# Patient Record
Sex: Female | Born: 1965 | Race: Black or African American | Hispanic: No | Smoking: Former smoker
Health system: Southern US, Community
[De-identification: ages and names within clinical notes are randomized; demographics above are authoritative.]

## PROBLEM LIST (undated history)

## (undated) DIAGNOSIS — I10 Essential (primary) hypertension: Secondary | ICD-10-CM

## (undated) DIAGNOSIS — E669 Obesity, unspecified: Secondary | ICD-10-CM

## (undated) DIAGNOSIS — Z923 Personal history of irradiation: Secondary | ICD-10-CM

## (undated) DIAGNOSIS — Z853 Personal history of malignant neoplasm of breast: Secondary | ICD-10-CM

## (undated) DIAGNOSIS — D649 Anemia, unspecified: Secondary | ICD-10-CM

## (undated) DIAGNOSIS — Z803 Family history of malignant neoplasm of breast: Secondary | ICD-10-CM

## (undated) DIAGNOSIS — C183 Malignant neoplasm of hepatic flexure: Secondary | ICD-10-CM

## (undated) DIAGNOSIS — G4730 Sleep apnea, unspecified: Secondary | ICD-10-CM

## (undated) DIAGNOSIS — N189 Chronic kidney disease, unspecified: Secondary | ICD-10-CM

## (undated) HISTORY — DX: Family history of malignant neoplasm of breast: Z80.3

## (undated) HISTORY — PX: CERVICAL SPINE SURGERY: SHX589

## (undated) HISTORY — DX: Essential (primary) hypertension: I10

## (undated) HISTORY — PX: TUBAL LIGATION: SHX77

## (undated) HISTORY — DX: Obesity, unspecified: E66.9

## (undated) HISTORY — PX: APPENDECTOMY: SHX54

## (undated) HISTORY — DX: Anemia, unspecified: D64.9

## (undated) HISTORY — PX: KNEE SURGERY: SHX244

---

## 2001-01-20 ENCOUNTER — Encounter: Admission: RE | Admit: 2001-01-20 | Discharge: 2001-04-20 | Payer: Self-pay | Admitting: Internal Medicine

## 2001-06-02 ENCOUNTER — Other Ambulatory Visit: Admission: RE | Admit: 2001-06-02 | Discharge: 2001-06-02 | Payer: Self-pay | Admitting: Internal Medicine

## 2002-01-17 ENCOUNTER — Emergency Department (HOSPITAL_COMMUNITY): Admission: EM | Admit: 2002-01-17 | Discharge: 2002-01-17 | Payer: Self-pay | Admitting: Emergency Medicine

## 2002-01-17 ENCOUNTER — Encounter: Payer: Self-pay | Admitting: Emergency Medicine

## 2003-12-30 ENCOUNTER — Other Ambulatory Visit: Admission: RE | Admit: 2003-12-30 | Discharge: 2003-12-30 | Payer: Self-pay | Admitting: Neurosurgery

## 2004-11-21 ENCOUNTER — Ambulatory Visit: Payer: Self-pay | Admitting: Internal Medicine

## 2005-01-01 ENCOUNTER — Ambulatory Visit: Payer: Self-pay | Admitting: Internal Medicine

## 2005-02-27 ENCOUNTER — Ambulatory Visit: Payer: Self-pay | Admitting: Internal Medicine

## 2005-05-23 ENCOUNTER — Ambulatory Visit: Payer: Self-pay | Admitting: Internal Medicine

## 2005-08-09 ENCOUNTER — Ambulatory Visit: Payer: Self-pay | Admitting: Internal Medicine

## 2005-08-13 ENCOUNTER — Ambulatory Visit: Payer: Self-pay | Admitting: Internal Medicine

## 2005-08-24 ENCOUNTER — Ambulatory Visit: Payer: Self-pay | Admitting: Internal Medicine

## 2005-09-28 ENCOUNTER — Ambulatory Visit: Payer: Self-pay | Admitting: Internal Medicine

## 2006-01-11 ENCOUNTER — Ambulatory Visit: Payer: Self-pay | Admitting: Internal Medicine

## 2006-03-08 ENCOUNTER — Ambulatory Visit: Payer: Self-pay | Admitting: Internal Medicine

## 2006-05-03 ENCOUNTER — Ambulatory Visit: Payer: Self-pay | Admitting: Internal Medicine

## 2006-09-05 ENCOUNTER — Ambulatory Visit: Payer: Self-pay | Admitting: Internal Medicine

## 2006-09-05 LAB — CONVERTED CEMR LAB
ALT: 27 units/L (ref 0–40)
AST: 22 units/L (ref 0–37)
Albumin: 3.3 g/dL — ABNORMAL LOW (ref 3.5–5.2)
Alkaline Phosphatase: 107 units/L (ref 39–117)
BUN: 9 mg/dL (ref 6–23)
Basophils Absolute: 0.2 10*3/uL — ABNORMAL HIGH (ref 0.0–0.1)
Basophils Relative: 5.5 % — ABNORMAL HIGH (ref 0.0–1.0)
CO2: 28 meq/L (ref 19–32)
Calcium: 9.5 mg/dL (ref 8.4–10.5)
Chloride: 103 meq/L (ref 96–112)
Chol/HDL Ratio, serum: 3.8
Cholesterol: 167 mg/dL (ref 0–200)
Creatinine, Ser: 0.8 mg/dL (ref 0.4–1.2)
Eosinophil percent: 1.4 % (ref 0.0–5.0)
GFR calc non Af Amer: 84 mL/min
Glomerular Filtration Rate, Af Am: 102 mL/min/{1.73_m2}
Glucose, Bld: 333 mg/dL — ABNORMAL HIGH (ref 70–99)
HCT: 39.2 % (ref 36.0–46.0)
HDL: 43.4 mg/dL (ref >39.0)
Hemoglobin: 12.8 g/dL (ref 12.0–15.0)
Hgb A1c MFr Bld: 12.9 % — ABNORMAL HIGH (ref 4.6–6.0)
LDL Cholesterol: 106 mg/dL — ABNORMAL HIGH (ref 0–99)
Lymphocytes Relative: 27.1 % (ref 12.0–46.0)
MCHC: 32.6 g/dL (ref 30.0–36.0)
MCV: 92 fL (ref 78.0–100.0)
Monocytes Absolute: 0.3 10*3/uL (ref 0.2–0.7)
Monocytes Relative: 6.4 % (ref 3.0–11.0)
Neutro Abs: 2.5 10*3/uL (ref 1.4–7.7)
Neutrophils Relative %: 59.6 % (ref 43.0–77.0)
Platelets: 266 10*3/uL (ref 150–400)
Potassium: 4.5 meq/L (ref 3.5–5.1)
RBC: 4.26 M/uL (ref 3.87–5.11)
RDW: 12.6 % (ref 11.5–14.6)
Sodium: 138 meq/L (ref 135–145)
TSH: 1.14 microintl units/mL (ref 0.35–5.50)
Total Bilirubin: 0.8 mg/dL (ref 0.3–1.2)
Total Protein: 7.3 g/dL (ref 6.0–8.3)
Triglyceride fasting, serum: 88 mg/dL (ref 0–149)
VLDL: 18 mg/dL (ref 0–40)
WBC: 4.3 10*3/uL — ABNORMAL LOW (ref 4.5–10.5)

## 2006-09-09 ENCOUNTER — Encounter: Payer: Self-pay | Admitting: Internal Medicine

## 2006-09-09 ENCOUNTER — Ambulatory Visit: Payer: Self-pay | Admitting: Internal Medicine

## 2006-09-09 ENCOUNTER — Other Ambulatory Visit: Admission: RE | Admit: 2006-09-09 | Discharge: 2006-09-09 | Payer: Self-pay | Admitting: Internal Medicine

## 2006-10-18 ENCOUNTER — Ambulatory Visit: Payer: Self-pay | Admitting: Internal Medicine

## 2006-12-20 ENCOUNTER — Ambulatory Visit: Payer: Self-pay | Admitting: Internal Medicine

## 2006-12-27 ENCOUNTER — Ambulatory Visit: Payer: Self-pay | Admitting: Internal Medicine

## 2007-03-18 ENCOUNTER — Ambulatory Visit: Payer: Self-pay | Admitting: Internal Medicine

## 2007-04-29 ENCOUNTER — Ambulatory Visit: Payer: Self-pay | Admitting: Internal Medicine

## 2007-04-30 LAB — CONVERTED CEMR LAB
ALT: 27 units/L (ref 0–40)
AST: 24 units/L (ref 0–37)
Albumin: 3.7 g/dL (ref 3.5–5.2)
Alkaline Phosphatase: 100 units/L (ref 39–117)
Bilirubin, Direct: 0.1 mg/dL (ref 0.0–0.3)
Hgb A1c MFr Bld: 10 % — ABNORMAL HIGH (ref 4.6–6.0)
Total Bilirubin: 0.8 mg/dL (ref 0.3–1.2)
Total Protein: 7.7 g/dL (ref 6.0–8.3)

## 2007-06-30 DIAGNOSIS — R51 Headache: Secondary | ICD-10-CM | POA: Insufficient documentation

## 2007-06-30 DIAGNOSIS — R519 Headache, unspecified: Secondary | ICD-10-CM | POA: Insufficient documentation

## 2007-06-30 DIAGNOSIS — E119 Type 2 diabetes mellitus without complications: Secondary | ICD-10-CM | POA: Insufficient documentation

## 2007-06-30 DIAGNOSIS — D509 Iron deficiency anemia, unspecified: Secondary | ICD-10-CM

## 2007-06-30 DIAGNOSIS — E162 Hypoglycemia, unspecified: Secondary | ICD-10-CM | POA: Insufficient documentation

## 2007-07-01 ENCOUNTER — Telehealth: Payer: Self-pay | Admitting: Internal Medicine

## 2007-08-01 ENCOUNTER — Ambulatory Visit: Payer: Self-pay | Admitting: Internal Medicine

## 2007-08-01 DIAGNOSIS — I10 Essential (primary) hypertension: Secondary | ICD-10-CM | POA: Insufficient documentation

## 2007-08-01 LAB — CONVERTED CEMR LAB
HCT: 34.3 % — ABNORMAL LOW (ref 36.0–46.0)
Hemoglobin: 11.3 g/dL — ABNORMAL LOW (ref 12.0–15.0)
Hgb A1c MFr Bld: 10.8 % — ABNORMAL HIGH (ref 4.6–6.1)
MCHC: 32.9 g/dL (ref 30.0–36.0)
MCV: 90.3 fL (ref 78.0–100.0)
Platelets: 256 10*3/uL (ref 150–400)
RBC: 3.8 M/uL — ABNORMAL LOW (ref 3.87–5.11)
RDW: 13.4 % (ref 11.5–14.0)
WBC: 7.1 10*3/uL (ref 4.0–10.5)

## 2007-10-31 ENCOUNTER — Ambulatory Visit: Payer: Self-pay | Admitting: Internal Medicine

## 2007-12-05 ENCOUNTER — Ambulatory Visit: Payer: Self-pay | Admitting: Internal Medicine

## 2007-12-05 LAB — CONVERTED CEMR LAB
BUN: 9 mg/dL (ref 6–23)
Basophils Absolute: 0.1 10*3/uL (ref 0.0–0.1)
Basophils Relative: 0.8 % (ref 0.0–1.0)
CO2: 27 meq/L (ref 19–32)
Calcium: 9.7 mg/dL (ref 8.4–10.5)
Chloride: 105 meq/L (ref 96–112)
Creatinine, Ser: 0.8 mg/dL (ref 0.4–1.2)
Eosinophils Absolute: 0.1 10*3/uL (ref 0.0–0.6)
Eosinophils Relative: 1.6 % (ref 0.0–5.0)
GFR calc Af Amer: 102 mL/min
GFR calc non Af Amer: 84 mL/min
Glucose, Bld: 154 mg/dL — ABNORMAL HIGH (ref 70–99)
HCT: 32.7 % — ABNORMAL LOW (ref 36.0–46.0)
Hemoglobin: 11.2 g/dL — ABNORMAL LOW (ref 12.0–15.0)
Hgb A1c MFr Bld: 7.9 % — ABNORMAL HIGH (ref 4.6–6.0)
Lymphocytes Relative: 27 % (ref 12.0–46.0)
MCHC: 34.3 g/dL (ref 30.0–36.0)
MCV: 91.2 fL (ref 78.0–100.0)
Monocytes Absolute: 0.5 10*3/uL (ref 0.2–0.7)
Monocytes Relative: 6.2 % (ref 3.0–11.0)
Neutro Abs: 4.7 10*3/uL (ref 1.4–7.7)
Neutrophils Relative %: 64.4 % (ref 43.0–77.0)
Platelets: 275 10*3/uL (ref 150–400)
Potassium: 4.6 meq/L (ref 3.5–5.1)
RBC: 3.59 M/uL — ABNORMAL LOW (ref 3.87–5.11)
RDW: 13.7 % (ref 11.5–14.6)
Sodium: 140 meq/L (ref 135–145)
WBC: 7.4 10*3/uL (ref 4.5–10.5)

## 2008-02-02 ENCOUNTER — Ambulatory Visit: Payer: Self-pay | Admitting: Internal Medicine

## 2008-03-05 ENCOUNTER — Telehealth: Payer: Self-pay | Admitting: Internal Medicine

## 2008-04-19 ENCOUNTER — Ambulatory Visit: Payer: Self-pay | Admitting: Internal Medicine

## 2008-04-19 LAB — CONVERTED CEMR LAB: Hgb A1c MFr Bld: 8.8 % — ABNORMAL HIGH (ref 4.6–6.0)

## 2008-04-26 ENCOUNTER — Ambulatory Visit: Payer: Self-pay | Admitting: Internal Medicine

## 2008-06-11 ENCOUNTER — Telehealth: Payer: Self-pay | Admitting: Internal Medicine

## 2008-08-08 ENCOUNTER — Emergency Department (HOSPITAL_COMMUNITY): Admission: EM | Admit: 2008-08-08 | Discharge: 2008-08-08 | Payer: Self-pay | Admitting: Family Medicine

## 2008-09-20 ENCOUNTER — Ambulatory Visit: Payer: Self-pay | Admitting: Internal Medicine

## 2009-01-18 ENCOUNTER — Ambulatory Visit: Payer: Self-pay | Admitting: Internal Medicine

## 2009-05-03 ENCOUNTER — Telehealth (INDEPENDENT_AMBULATORY_CARE_PROVIDER_SITE_OTHER): Payer: Self-pay | Admitting: *Deleted

## 2009-12-05 ENCOUNTER — Ambulatory Visit: Payer: Self-pay | Admitting: Internal Medicine

## 2009-12-08 LAB — CONVERTED CEMR LAB
BUN: 12 mg/dL (ref 6–23)
Basophils Absolute: 0.1 10*3/uL (ref 0.0–0.1)
Basophils Relative: 0.8 % (ref 0.0–3.0)
CO2: 24 meq/L (ref 19–32)
Calcium: 9.3 mg/dL (ref 8.4–10.5)
Chloride: 101 meq/L (ref 96–112)
Creatinine, Ser: 0.8 mg/dL (ref 0.4–1.2)
Eosinophils Absolute: 0 10*3/uL (ref 0.0–0.7)
Eosinophils Relative: 0.7 % (ref 0.0–5.0)
GFR calc non Af Amer: 100.3 mL/min (ref >60)
Glucose, Bld: 296 mg/dL — ABNORMAL HIGH (ref 70–99)
HCT: 37.4 % (ref 36.0–46.0)
Hemoglobin: 12.1 g/dL (ref 12.0–15.0)
Hgb A1c MFr Bld: 13.1 % — ABNORMAL HIGH (ref 4.6–6.5)
Lymphocytes Relative: 30.9 % (ref 12.0–46.0)
Lymphs Abs: 2.1 10*3/uL (ref 0.7–4.0)
MCHC: 32.3 g/dL (ref 30.0–36.0)
MCV: 94.8 fL (ref 78.0–100.0)
Monocytes Absolute: 0.3 10*3/uL (ref 0.1–1.0)
Monocytes Relative: 4.3 % (ref 3.0–12.0)
Neutro Abs: 4.3 10*3/uL (ref 1.4–7.7)
Neutrophils Relative %: 63.3 % (ref 43.0–77.0)
Platelets: 211 10*3/uL (ref 150.0–400.0)
Potassium: 3.9 meq/L (ref 3.5–5.1)
RBC: 3.94 M/uL (ref 3.87–5.11)
RDW: 12.9 % (ref 11.5–14.6)
Sodium: 136 meq/L (ref 135–145)
WBC: 6.8 10*3/uL (ref 4.5–10.5)

## 2010-02-06 ENCOUNTER — Ambulatory Visit: Payer: Self-pay | Admitting: Internal Medicine

## 2010-03-15 ENCOUNTER — Telehealth: Payer: Self-pay | Admitting: Internal Medicine

## 2010-04-03 ENCOUNTER — Ambulatory Visit: Payer: Self-pay | Admitting: Internal Medicine

## 2010-04-03 LAB — CONVERTED CEMR LAB: Hgb A1c MFr Bld: 11.9 % — ABNORMAL HIGH (ref 4.6–6.5)

## 2010-04-10 ENCOUNTER — Ambulatory Visit: Payer: Self-pay | Admitting: Internal Medicine

## 2010-11-07 ENCOUNTER — Encounter: Payer: Self-pay | Admitting: Internal Medicine

## 2010-12-18 ENCOUNTER — Other Ambulatory Visit: Payer: Self-pay | Admitting: Internal Medicine

## 2010-12-18 ENCOUNTER — Ambulatory Visit
Admission: RE | Admit: 2010-12-18 | Discharge: 2010-12-18 | Payer: Self-pay | Source: Home / Self Care | Attending: Internal Medicine | Admitting: Internal Medicine

## 2010-12-18 LAB — HEMOGLOBIN A1C: Hgb A1c MFr Bld: 13.7 % — ABNORMAL HIGH (ref 4.6–6.5)

## 2010-12-18 LAB — CBC WITH DIFFERENTIAL/PLATELET
Basophils Absolute: 0 10*3/uL (ref 0.0–0.1)
HCT: 36.6 % (ref 36.0–46.0)
Lymphs Abs: 1.8 10*3/uL (ref 0.7–4.0)
Monocytes Relative: 6.9 % (ref 3.0–12.0)
Neutrophils Relative %: 54.8 % (ref 43.0–77.0)
Platelets: 220 10*3/uL (ref 150.0–400.0)
RDW: 13.6 % (ref 11.5–14.6)

## 2010-12-18 LAB — BASIC METABOLIC PANEL
BUN: 12 mg/dL (ref 6–23)
Calcium: 8.7 mg/dL (ref 8.4–10.5)
Chloride: 103 mEq/L (ref 96–112)
Creatinine, Ser: 0.7 mg/dL (ref 0.4–1.2)
GFR: 126.85 mL/min (ref >60.00)

## 2010-12-18 LAB — IBC PANEL
Iron: 102 ug/dL (ref 42–145)
Transferrin: 237 mg/dL (ref 212.0–360.0)

## 2010-12-19 NOTE — Assessment & Plan Note (Signed)
Summary: fup on dm//ccm   Vital Signs:  Patient profile:   45 year old female Height:      65 inches Weight:      221 pounds BMI:     36.91 Temp:     98.2 degrees F oral Pulse rate:   72 / minute Resp:     14 per minute BP sitting:   144 / 80  (left arm) Cuff size:   large  Vitals Entered By: Allyne Gee, LPN (January 17, 624THL 4:03 PM) CC: roa- htn/dm   CC:  roa- htn/dm.  History of Present Illness: The pt has not been taking any pills due to cost and change in insurance she asserts thjat she will do better in 2011? She states that all of her refills have expired. She did not call for samples of help, although she admits that we have helped in the past feels week and has a hx of iron def anemia is not aware of her blood sugar reading or blood pressure readings   Preventive Screening-Counseling & Management  Alcohol-Tobacco     Smoking Status: quit  Allergies: 1)  ! Augustina Mood  Past History:  Family History: Last updated: 08/01/2007 Family History Diabetes 1st degree relative Family History of Asthma Family History Hypertension  Social History: Last updated: 06/30/2007 Former Smoker Alcohol use-no Occupation: Divorced  Risk Factors: Exercise: no (08/01/2007)  Risk Factors: Smoking Status: quit (12/05/2009)  Past medical, surgical, family and social histories (including risk factors) reviewed, and no changes noted (except as noted below).  Past Medical History: Reviewed history from 08/01/2007 and no changes required. Anemia-iron deficiency Diabetes mellitus, type II Headache Hypertension  Past Surgical History: Reviewed history from 06/30/2007 and no changes required. Appendectomy Caesarean section Tubal ligation  Family History: Reviewed history from 08/01/2007 and no changes required. Family History Diabetes 1st degree relative Family History of Asthma Family History Hypertension  Social History: Reviewed history from 06/30/2007 and  no changes required. Former Smoker Alcohol use-no Occupation: Divorced  Review of Systems  The patient denies anorexia, fever, weight loss, weight gain, vision loss, decreased hearing, hoarseness, chest pain, syncope, dyspnea on exertion, peripheral edema, prolonged cough, headaches, hemoptysis, abdominal pain, melena, hematochezia, severe indigestion/heartburn, hematuria, incontinence, genital sores, muscle weakness, suspicious skin lesions, transient blindness, difficulty walking, depression, unusual weight change, abnormal bleeding, enlarged lymph nodes, angioedema, and breast masses.    Physical Exam  General:  Well-developed,well-nourished,in no acute distress; alert,appropriate and cooperative throughout examination Head:  Normocephalic and atraumatic without obvious abnormalities. No apparent alopecia or balding. Eyes:  pupils equal and pupils round.   Ears:  R ear normal and L ear normal.   Nose:  no external erythema.   Mouth:  pharynx pink and moist.   Neck:  No deformities, masses, or tenderness noted. Lungs:  normal respiratory effort, normal breath sounds, and no wheezes.   Heart:  normal rate, regular rhythm, and Grade  1 /6 systolic ejection murmur.   Abdomen:  soft, no guarding, no rigidity, and distended.   Msk:  no joint swelling, no joint warmth, and no redness over joints.   Pulses:  R and L carotid,radial,femoral,dorsalis pedis and posterior tibial pulses are full and equal bilaterally Extremities:  trace left pedal edema and trace right pedal edema.   Neurologic:  alert & oriented X3, DTRs symmetrical and normal, and decreased sensation to LT.     Impression & Recommendations:  Problem # 1:  HYPERTENSION (ICD-401.9) Assessment Deteriorated  The following  medications were removed from the medication list:    Lasix 80 Mg Tabs (Furosemide) .Marland Kitchen... 1 once daily Her updated medication list for this problem includes:    Benicar Hct 20-12.5 Mg Tabs (Olmesartan  medoxomil-hctz) ..... One by mouth daily  BP today: 144/80 Prior BP: 134/80 (01/18/2009)  Prior 10 Yr Risk Heart Disease: 7 % (01/18/2009)  Labs Reviewed: K+: 4.6 (12/05/2007) Creat: : 0.8 (12/05/2007)   Chol: 167 (09/05/2006)   HDL: 43.4 (09/05/2006)   LDL: 106 (09/05/2006)   TG: 88 (09/05/2006)  Orders: TLB-BMP (Basic Metabolic Panel-BMET) (99991111)  Problem # 2:  DIABETES MELLITUS, TYPE II (ICD-250.00) Assessment: Deteriorated  Her updated medication list for this problem includes:    Lantus 100 Unit/ml Soln (Insulin glargine) .Marland KitchenMarland KitchenMarland KitchenMarland Kitchen 30u subcutaneously  at night    Benicar Hct 20-12.5 Mg Tabs (Olmesartan medoxomil-hctz) ..... One by mouth daily    Janumet 50-1000 Mg Tabs (Sitagliptin-metformin hcl) .Marland Kitchen..Marland Kitchen Two times a day  Labs Reviewed: Creat: 0.8 (12/05/2007)    Reviewed HgBA1c results: 8.8 (04/19/2008)  7.9 (12/05/2007)  Orders: TLB-A1C / Hgb A1C (Glycohemoglobin) (83036-A1C)  Problem # 3:  HYPERTENSION (ICD-401.9)  The following medications were removed from the medication list:    Lasix 80 Mg Tabs (Furosemide) .Marland Kitchen... 1 once daily Her updated medication list for this problem includes:    Benicar Hct 20-12.5 Mg Tabs (Olmesartan medoxomil-hctz) ..... One by mouth daily  BP today: 144/80 Prior BP: 134/80 (01/18/2009)  Prior 10 Yr Risk Heart Disease: 7 % (01/18/2009)  Labs Reviewed: K+: 4.6 (12/05/2007) Creat: : 0.8 (12/05/2007)   Chol: 167 (09/05/2006)   HDL: 43.4 (09/05/2006)   LDL: 106 (09/05/2006)   TG: 88 (09/05/2006)  Orders: TLB-BMP (Basic Metabolic Panel-BMET) (99991111)  Problem # 4:  ANEMIA-IRON DEFICIENCY (ICD-280.9)  Orders: TLB-CBC Platelet - w/Differential (85025-CBCD) Venipuncture HR:[id number])  Hgb: 11.2 (12/05/2007)   Hct: 32.7 (12/05/2007)   Platelets: 275 (12/05/2007) RBC: 3.59 (12/05/2007)   RDW: 13.7 (12/05/2007)   WBC: 7.4 (12/05/2007) MCV: 91.2 (12/05/2007)   MCHC: 34.3 (12/05/2007) TSH: 1.14 (09/05/2006)  Complete Medication  List: 1)  Lantus 100 Unit/ml Soln (Insulin glargine) .... 30u subcutaneously  at night 2)  Klor-con 10 10 Meq Tbcr (Potassium chloride) .... One by mouth bid 3)  Benicar Hct 20-12.5 Mg Tabs (Olmesartan medoxomil-hctz) .... One by mouth daily 4)  Janumet 50-1000 Mg Tabs (Sitagliptin-metformin hcl) .... Two times a day 5)  Accu-chek Aviva Strp (Glucose blood) .... Test three times a day   q ac  Patient Instructions: 1)  Please schedule a follow-up appointment in 2 months. Prescriptions: JANUMET 50-1000 MG  TABS (SITAGLIPTIN-METFORMIN HCL) two times a day  #60 x 11   Entered and Authorized by:   Ricard Dillon MD   Signed by:   Ricard Dillon MD on 12/05/2009   Method used:   Electronically to        [facility] [phone]* (retail)       [place], [place]  [address]       Ph: [phone]       Fax: [phone]   [id number] BENICAR HCT 20-12.5 MG TABS (OLMESARTAN MEDOXOMIL-HCTZ) one by mouth daily  #30 x 11   Entered and Authorized by:   Ricard Dillon MD   Signed by:   Ricard Dillon MD on 12/05/2009   Method used:   Electronically to        [facility] (retail)       [place], [place]  [address]       Ph: [phone]       Fax: [phone]   RxID:   [id number] LANTUS 100 UNIT/ML  SOLN (INSULIN GLARGINE) 30u subcutaneously  at night  #3 pins x 11   Entered and Authorized by:   Ricard Dillon MD   Signed by:   Ricard Dillon MD on 12/05/2009   Method used:   Electronically to        [facility] [phone]* (retail)       [address]       [place], [place]  [address]       Ph: [phone]       Fax: [phone]   [id number] JANUMET 50-1000 MG  TABS (SITAGLIPTIN-METFORMIN HCL) two times a day  #60 x 11   Entered and Authorized by:   Ricard Dillon MD   Signed by:   Ricard Dillon MD on 12/05/2009   Method used:   Electronically to        [facility] [phone][facility] (retail)       [address]       [place], [place]  [address]       Ph: [phone]       Fax: [phone]   RxID:   [id number] [place] HCT 20-12.5 MG TABS (OLMESARTAN MEDOXOMIL-HCTZ) one by mouth daily  #30 x 11   Entered and Authorized by:   Ricard Dillon MD   Signed by:   Ricard Dillon MD on 12/05/2009   Method used:   Electronically to        [phone] (retail)       [address]       [place], [place]  [address]       Ph: [phone]       Fax: [phone]   RxID:   [id number]

## 2010-12-19 NOTE — Progress Notes (Signed)
Summary: cough med or something  Phone Note Call from Patient Call back at Home Phone [phone]   Reason for Call: Insurance Question Summary of Call: Requesting cough med or something.  Lot of cough, cannot get mucus up.  Stopped up.   No fever.  [facility] [address] Initial call taken by: Shelbie Hutching, RN,  March 15, 2010 10:18 AM  Follow-up for Phone Call        per dr Arnoldo Morale z pack and atuss Follow-up by: Allyne Gee, LPN,  April 27, 624THL 11:18 AM    New/Updated Medications: ZITHROMAX Z-PAK 250 MG TABS (AZITHROMYCIN) take as directed ATUSS DS 30-4-30 MG/5ML SUSP (PSEUDOEPHED HCL-CPM-DM HBR TAN) 2 tsp every 12 hours Prescriptions: ATUSS DS 30-4-30 MG/5ML SUSP (PSEUDOEPHED HCL-CPM-DM HBR TAN) 2 tsp every 12 hours  #6oz x 0   Entered by:   Allyne Gee, LPN   Authorized by:   Ricard Dillon MD   Signed by:   Allyne Gee, LPN on [id number]   Method used:   Electronically to        [facility] [phone]* (retail)       [address]       [place], [place]  [address]       Ph: [phone]       Fax: [phone]   RxID:   [id number] ZITHROMAX Z-PAK 250 MG TABS (AZITHROMYCIN) take as directed  #1 x 0   Entered by:   Allyne Gee, LPN   Authorized by:   Ricard Dillon MD   Signed by:   Allyne Gee, LPN on [id number]   Method used:   Electronically to        [facility] [phone]* (retail)       [address]       [place], [place]  [address]       Ph: [phone]       Fax: [phone]   RxID:   [id number]   Appended Document: cough med or something pt informed  Appended Document: cough med or something left message on machine at pharmacy and told them to disregard- sent to wrong pharmacy

## 2010-12-19 NOTE — Assessment & Plan Note (Signed)
Summary: 2 month fup//ccm   Vital Signs:  Patient profile:   45 year old female Height:      65 inches Weight:      227 pounds BMI:     37.91 Temp:     98.2 degrees F oral Pulse rate:   76 / minute Resp:     14 per minute BP sitting:   140 / 80  (left arm)  Vitals Entered By: Allyne Gee, LPN (May 23, 624THL [id number] PM) CC: c/o lingering cough and feet swelling, Hypertension Management, Type 2 diabetes mellitus follow-up   CC:  c/o lingering cough and feet swelling, Hypertension Management, and Type 2 diabetes mellitus follow-up.  History of Present Illness: the a1c has moved from 13 to 11 she has noted increased sweeling in her feet and I am afraid that this is a result of the lack of diabetic control   Type 2 Diabetes Mellitus Follow-Up      This is a 45 year old woman who presents for Type 2 diabetes mellitus follow-up.  historically non adherant.  The patient denies polyuria, polydipsia, blurred vision, self managed hypoglycemia, hypoglycemia requiring help, weight loss, weight gain, and numbness of extremities.  Since the last visit the patient reports poor dietary compliance, noncompliance with medications, not exercising regularly, and not monitoring blood glucose.  Since the last visit, the patient reports having had no eye care and no foot care.    Hypertension History:      She denies headache, chest pain, palpitations, dyspnea with exertion, orthopnea, PND, peripheral edema, visual symptoms, neurologic problems, syncope, and side effects from treatment.        Positive major cardiovascular risk factors include diabetes and hypertension.  Negative major cardiovascular risk factors include female age less than 16 years old and non-tobacco-user status.     Preventive Screening-Counseling & Management  Alcohol-Tobacco     Smoking Status: quit  Problems Prior to Update: 1)  Hypertension  (ICD-401.9) 2)  Family History of Asthma  (ICD-V17.5) 3)  Headache   (ICD-784.0) 4)  Family History Diabetes 1st Degree Relative  (ICD-V18.0) 5)  Hypoglycemia Nos  (ICD-251.2) 6)  Diabetes Mellitus, Type II  (ICD-250.00) 7)  Anemia-iron Deficiency  (ICD-280.9)  Current Problems (verified): 1)  Hypertension  (ICD-401.9) 2)  Family History of Asthma  (ICD-V17.5) 3)  Headache  (ICD-784.0) 4)  Family History Diabetes 1st Degree Relative  (ICD-V18.0) 5)  Hypoglycemia Nos  (ICD-251.2) 6)  Diabetes Mellitus, Type II  (ICD-250.00) 7)  Anemia-iron Deficiency  (ICD-280.9)  Medications Prior to Update: 1)  Lantus 100 Unit/ml  Soln (Insulin Glargine) .... 30u Subcutaneously  At Night 2)  Benicar Hct 20-12.5 Mg Tabs (Olmesartan Medoxomil-Hctz) .... One By Mouth Daily 3)  Kombiglyze Xr 03-999 Mg Xr24h-Tab (Saxagliptin-Metformin) .... One By Mouth Daily 4)  Onetouch Ultra Test  Strp (Glucose Blood) .... Use As Directed Tid 5)  Zithromax Z-Pak 250 Mg Tabs (Azithromycin) .... Take As Directed 6)  Atuss Ds 30-4-30 Mg/40ml Susp (Pseudoephed Hcl-Cpm-Dm Hbr Tan) .... 2 Tsp Every 12 Hours  Current Medications (verified): 1)  Lantus 100 Unit/ml  Soln (Insulin Glargine) .... 40 U Subcutaneously  At Night 2)  Benicar Hct 40-25 Mg Tabs (Olmesartan Medoxomil-Hctz) .... One By Mouth Daily 3)  Kombiglyze Xr 03-999 Mg Xr24h-Tab (Saxagliptin-Metformin) .... One By Mouth Daily 4)  Onetouch Ultra Test  Strp (Glucose Blood) .... Use As Directed Tid  Allergies (verified): 1)  ! Augustina Mood  Past History:  Family History:  Last updated: 08/01/2007 Family History Diabetes 1st degree relative Family History of Asthma Family History Hypertension  Social History: Last updated: 06/30/2007 Former Smoker Alcohol use-no Occupation: Divorced  Risk Factors: Exercise: no (08/01/2007)  Risk Factors: Smoking Status: quit (04/10/2010)  Past medical, surgical, family and social histories (including risk factors) reviewed, and no changes noted (except as noted below).  Past Medical  History: Reviewed history from 08/01/2007 and no changes required. Anemia-iron deficiency Diabetes mellitus, type II Headache Hypertension  Past Surgical History: Reviewed history from 06/30/2007 and no changes required. Appendectomy Caesarean section Tubal ligation  Family History: Reviewed history from 08/01/2007 and no changes required. Family History Diabetes 1st degree relative Family History of Asthma Family History Hypertension  Social History: Reviewed history from 06/30/2007 and no changes required. Former Smoker Alcohol use-no Occupation: Divorced  Review of Systems       The patient complains of weight loss.  The patient denies anorexia, fever, weight gain, vision loss, decreased hearing, hoarseness, chest pain, syncope, dyspnea on exertion, peripheral edema, prolonged cough, headaches, hemoptysis, abdominal pain, melena, hematochezia, severe indigestion/heartburn, hematuria, incontinence, genital sores, muscle weakness, suspicious skin lesions, transient blindness, difficulty walking, depression, unusual weight change, abnormal bleeding, enlarged lymph nodes, angioedema, and breast masses.    Physical Exam  General:  Well-developed,well-nourished,in no acute distress; alert,appropriate and cooperative throughout examination Head:  Normocephalic and atraumatic without obvious abnormalities. No apparent alopecia or balding. Eyes:  pupils equal and pupils round.   Neck:  No deformities, masses, or tenderness noted. Lungs:  normal respiratory effort, normal breath sounds, and no wheezes.   Heart:  normal rate, regular rhythm, and Grade  1 /6 systolic ejection murmur.   Abdomen:  soft, no guarding, no rigidity, and distended.    Diabetes Management Exam:    Foot Exam (with socks and/or shoes not present):       Sensory-Pinprick/Light touch:          Left medial foot (L-4): diminished          Left dorsal foot (L-5): diminished          Left lateral foot (S-1):  diminished          Right medial foot (L-4): diminished          Right dorsal foot (L-5): diminished          Right lateral foot (S-1): diminished       Inspection:          Left foot: normal          Right foot: normal       Nails:          Left foot: normal          Right foot: thickened   Impression & Recommendations:  Problem # 1:  HYPERTENSION (ICD-401.9)  Her updated medication list for this problem includes:    Benicar Hct 40-25 Mg Tabs (Olmesartan medoxomil-hctz) ..... One by mouth daily  BP today: 140/80 Prior BP: 132/78 (02/06/2010)  10 Yr Risk Heart Disease: 9 % Prior 10 Yr Risk Heart Disease: 7 % (01/18/2009)  Labs Reviewed: K+: 3.9 (12/05/2009) Creat: : 0.8 (12/05/2009)   Chol: 167 (09/05/2006)   HDL: 43.4 (09/05/2006)   LDL: 106 (09/05/2006)   TG: 88 (09/05/2006)  Problem # 2:  DIABETES MELLITUS, TYPE II (ICD-250.00) improved a1c but far from goals pt is non adherant and due to fainances I suspect she failed to take medicaions we have given her as many samples as  possible and will follow her closely Her updated medication list for this problem includes:    Lantus 100 Unit/ml Soln (Insulin glargine) .Marland KitchenMarland KitchenMarland KitchenMarland Kitchen 40 u subcutaneously  at night    Benicar Hct 40-25 Mg Tabs (Olmesartan medoxomil-hctz) ..... One by mouth daily    Kombiglyze Xr 03-999 Mg Xr24h-tab (Saxagliptin-metformin) ..... One by mouth daily  Labs Reviewed: Creat: 0.8 (12/05/2009)    Reviewed HgBA1c results: 13.1 (12/05/2009)  8.8 (04/19/2008)  Complete Medication List: 1)  Lantus 100 Unit/ml Soln (Insulin glargine) .... 40 u subcutaneously  at night 2)  Benicar Hct 40-25 Mg Tabs (Olmesartan medoxomil-hctz) .... One by mouth daily 3)  Kombiglyze Xr 03-999 Mg Xr24h-tab (Saxagliptin-metformin) .... One by mouth daily 4)  Onetouch Ultra Test Strp (Glucose blood) .... Use as directed tid  Hypertension Assessment/Plan:      The patient's hypertensive risk group is category C: Target organ damage  and/or diabetes.  Her calculated 10 year risk of coronary heart disease is 9 %.  Today's blood pressure is 140/80.  Her blood pressure goal is < 130/80.  Patient Instructions: 1)  Please schedule a follow-up appointment in 2 months.

## 2010-12-19 NOTE — Assessment & Plan Note (Signed)
Summary: 2 mo rov/mm   Vital Signs:  Patient profile:   45 year old female Height:      65 inches Weight:      228 pounds BMI:     38.08 Temp:     98.2 degrees F oral Pulse rate:   72 / minute BP sitting:   132 / 78  (left arm) Cuff size:   large  Vitals Entered By: Allyne Gee, LPN (March 21, [id number] [id number] PM) CC: roa-dm, Hypertension Management Is Patient Diabetic? Yes Did you bring your meter with you today? No   CC:  roa-dm and Hypertension Management.  History of Present Illness: The a1c was 13 but she was oof the medications she states that she has been better with  the resumption of the medcations she has noted some side effects for the janumet ( GI) probable due ot the rapid release the kombiglize ( one a day) may been better with the GI side effects  Hypertension History:      She denies headache, chest pain, palpitations, dyspnea with exertion, orthopnea, PND, peripheral edema, visual symptoms, neurologic problems, syncope, and side effects from treatment.  stable.        Positive major cardiovascular risk factors include diabetes and hypertension.  Negative major cardiovascular risk factors include female age less than 3 years old and non-tobacco-user status.     Preventive Screening-Counseling & Management  Alcohol-Tobacco     Smoking Status: quit  Current Problems (verified): 1)  Hypertension  (ICD-401.9) 2)  Family History of Asthma  (ICD-V17.5) 3)  Headache  (ICD-784.0) 4)  Family History Diabetes 1st Degree Relative  (ICD-V18.0) 5)  Hypoglycemia Nos  (ICD-251.2) 6)  Diabetes Mellitus, Type II  (ICD-250.00) 7)  Anemia-iron Deficiency  (ICD-280.9)  Current Medications (verified): 1)  Lantus 100 Unit/ml  Soln (Insulin Glargine) .... 30u Subcutaneously  At Night 2)  Benicar Hct 20-12.5 Mg Tabs (Olmesartan Medoxomil-Hctz) .... One By Mouth Daily 3)  Janumet 50-1000 Mg  Tabs (Sitagliptin-Metformin Hcl) .... Two Times A Day 4)  Accu-Chek Aviva   Strp  (Glucose Blood) .... Test Three Times A Day   Q Ac  Allergies (verified): 1)  ! Augustina Mood  Past History:  Family History: Last updated: 08/01/2007 Family History Diabetes 1st degree relative Family History of Asthma Family History Hypertension  Social History: Last updated: 06/30/2007 Former Smoker Alcohol use-no Occupation: Divorced  Risk Factors: Exercise: no (08/01/2007)  Risk Factors: Smoking Status: quit (02/06/2010)  Past medical, surgical, family and social histories (including risk factors) reviewed, and no changes noted (except as noted below).  Past Medical History: Reviewed history from 08/01/2007 and no changes required. Anemia-iron deficiency Diabetes mellitus, type II Headache Hypertension  Past Surgical History: Reviewed history from 06/30/2007 and no changes required. Appendectomy Caesarean section Tubal ligation  Family History: Reviewed history from 08/01/2007 and no changes required. Family History Diabetes 1st degree relative Family History of Asthma Family History Hypertension  Social History: Reviewed history from 06/30/2007 and no changes required. Former Smoker Alcohol use-no Occupation: Divorced  Review of Systems  The patient denies anorexia, fever, weight loss, weight gain, vision loss, decreased hearing, hoarseness, chest pain, syncope, dyspnea on exertion, peripheral edema, prolonged cough, headaches, hemoptysis, abdominal pain, melena, hematochezia, severe indigestion/heartburn, hematuria, incontinence, genital sores, muscle weakness, suspicious skin lesions, transient blindness, difficulty walking, depression, unusual weight change, abnormal bleeding, enlarged lymph nodes, angioedema, and breast masses.         non adherance  Physical Exam  General:  Well-developed,well-nourished,in no acute distress; alert,appropriate and cooperative throughout examination Head:  Normocephalic and atraumatic without obvious abnormalities. No  apparent alopecia or balding. Eyes:  pupils equal and pupils round.   Ears:  R ear normal and L ear normal.   Nose:  no external erythema.   Mouth:  pharynx pink and moist.   Neck:  No deformities, masses, or tenderness noted. Lungs:  normal respiratory effort, normal breath sounds, and no wheezes.   Heart:  normal rate, regular rhythm, and Grade  1 /6 systolic ejection murmur.   Abdomen:  soft, no guarding, no rigidity, and distended.   Msk:  no joint swelling, no joint warmth, and no redness over joints.     Impression & Recommendations:  Problem # 1:  DIABETES MELLITUS, TYPE II (ICD-250.00) Assessment Deteriorated severe a1c rise due to non compliance that she states has stoped... reveiwed meds and gave referral to DM education Her updated medication list for this problem includes:    Lantus 100 Unit/ml Soln (Insulin glargine) .Marland KitchenMarland KitchenMarland KitchenMarland Kitchen 30u subcutaneously  at night    Benicar Hct 20-12.5 Mg Tabs (Olmesartan medoxomil-hctz) ..... One by mouth daily    Kombiglyze Xr 03-999 Mg Xr24h-tab (Saxagliptin-metformin) ..... One by mouth daily  Labs Reviewed: Creat: 0.8 (12/05/2009)    Reviewed HgBA1c results: 13.1 (12/05/2009)  8.8 (04/19/2008)  Problem # 2:  HYPERTENSION (ICD-401.9) Assessment: Improved  Her updated medication list for this problem includes:    Benicar Hct 20-12.5 Mg Tabs (Olmesartan medoxomil-hctz) ..... One by mouth daily  BP today: 132/78 Prior BP: 144/80 (12/05/2009)  Prior 10 Yr Risk Heart Disease: 7 % (01/18/2009)  Labs Reviewed: K+: 3.9 (12/05/2009) Creat: : 0.8 (12/05/2009)   Chol: 167 (09/05/2006)   HDL: 43.4 (09/05/2006)   LDL: 106 (09/05/2006)   TG: 88 (09/05/2006)  Problem # 3:  ANEMIA-IRON DEFICIENCY (ICD-280.9)  Hgb: 12.1 (12/05/2009)   Hct: 37.4 (12/05/2009)   Platelets: 211.0 (12/05/2009) RBC: 3.94 (12/05/2009)   RDW: 12.9 (12/05/2009)   WBC: 6.8 (12/05/2009) MCV: 94.8 (12/05/2009)   MCHC: 32.3 (12/05/2009) TSH: 1.14 (09/05/2006)  Complete  Medication List: 1)  Lantus 100 Unit/ml Soln (Insulin glargine) .... 30u subcutaneously  at night 2)  Benicar Hct 20-12.5 Mg Tabs (Olmesartan medoxomil-hctz) .... One by mouth daily 3)  Kombiglyze Xr 03-999 Mg Xr24h-tab (Saxagliptin-metformin) .... One by mouth daily 4)  Onetouch Ultra Test Strp (Glucose blood) .... Use as directed tid  Hypertension Assessment/Plan:      The patient's hypertensive risk group is category C: Target organ damage and/or diabetes.  Her calculated 10 year risk of coronary heart disease is 7 %.  Today's blood pressure is 132/78.  Her blood pressure goal is < 130/80.  Patient Instructions: 1)  Please schedule a follow-up appointment in 2 months. 2)  HbgA1C prior to visit, ICD-9:250.00 Prescriptions: KOMBIGLYZE XR 03-999 MG XR24H-TAB (SAXAGLIPTIN-METFORMIN) one by mouth daily  #30 x 11   Entered and Authorized by:   Ricard Dillon MD   Signed by:   Ricard Dillon MD on 02/06/2010   Method used:   Electronically to        [facility] [phone]* (retail)       [place], [place]  [address]       Ph: [phone]       Fax: [phone]   [id number] ONETOUCH ULTRA TEST  STRP (GLUCOSE BLOOD) use as directed TID  #100 x 11  Entered and Authorized by:   Ricard Dillon MD   Signed by:   Ricard Dillon MD on 02/06/2010   Method used:   Electronically to        [facility] [phone]* (retail)       [address]       [place], [place]  [address]       Ph: [phone]       Fax: [phone]   RxID:   [id number]

## 2010-12-21 NOTE — Medication Information (Signed)
Summary: Order for Diabetic Testing Supplies  Order for Diabetic Testing Supplies   Imported By: Laural Benes 11/14/2010 10:21:45  _____________________________________________________________________  External Attachment:    Type:   Image     Comment:   External Document

## 2010-12-27 NOTE — Assessment & Plan Note (Signed)
Summary: fup//ccm OK PER DOC/NJR/pt rsc/cjr   Vital Signs:  Patient profile:   45 year old female Height:      65 inches Weight:      226 pounds BMI:     37.74 Temp:     98.2 degrees F oral Pulse rate:   76 / minute Resp:     14 per minute BP sitting:   142 / 84  (left arm) Cuff size:   large  Vitals Entered By: Allyne Gee, LPN (January 30, [id number] 9:51 AM) CC: roa-dm/htn, Hypertension Management Is Patient Diabetic? Yes Did you bring your meter with you today? No   Primary Care Provider:  Ricard Dillon MD  CC:  roa-dm/htn and Hypertension Management.  History of Present Illness:  patient is a 45 year old African American female with insulin requiring diabetes who has had a history of very labile control and recently has noted blood sugars ranging from 200 400 requiring extra insulin dosing. This visit was over 6 months ago. the patient is on currently Lantus 30 units twice a day her comorbidities are hypertension she is on ARB Benicar.   Hypertension History:      She denies headache, chest pain, palpitations, dyspnea with exertion, orthopnea, PND, peripheral edema, visual symptoms, neurologic problems, syncope, and side effects from treatment.        Positive major cardiovascular risk factors include diabetes and hypertension.  Negative major cardiovascular risk factors include female age less than 56 years old and non-tobacco-user status.     Preventive Screening-Counseling & Management  Alcohol-Tobacco     Smoking Status: quit  Problems Prior to Update: 1)  Hypertension  (ICD-401.9) 2)  Family History of Asthma  (ICD-V17.5) 3)  Headache  (ICD-784.0) 4)  Family History Diabetes 1st Degree Relative  (ICD-V18.0) 5)  Hypoglycemia Nos  (ICD-251.2) 6)  Diabetes Mellitus, Type II  (ICD-250.00) 7)  Anemia-iron Deficiency  (ICD-280.9)  Current Problems (verified): 1)  Hypertension  (ICD-401.9) 2)  Family History of Asthma  (ICD-V17.5) 3)  Headache  (ICD-784.0) 4)   Family History Diabetes 1st Degree Relative  (ICD-V18.0) 5)  Hypoglycemia Nos  (ICD-251.2) 6)  Diabetes Mellitus, Type II  (ICD-250.00) 7)  Anemia-iron Deficiency  (ICD-280.9)  Medications Prior to Update: 1)  Lantus 100 Unit/ml  Soln (Insulin Glargine) .... 40 U Subcutaneously  At Night 2)  Benicar Hct 40-25 Mg Tabs (Olmesartan Medoxomil-Hctz) .... One By Mouth Daily 3)  Kombiglyze Xr 03-999 Mg Xr24h-Tab (Saxagliptin-Metformin) .... One By Mouth Daily 4)  Onetouch Ultra Test  Strp (Glucose Blood) .... Use As Directed Tid  Current Medications (verified): 1)  Lantus 100 Unit/ml  Soln (Insulin Glargine) .... 40 U Subcutaneously  At Night 2)  Benicar Hct 40-25 Mg Tabs (Olmesartan Medoxomil-Hctz) .... One By Mouth Daily 3)  Kombiglyze Xr 03-999 Mg Xr24h-Tab (Saxagliptin-Metformin) .... One By Mouth Daily 4)  Onetouch Ultra Test  Strp (Glucose Blood) .... Use As Directed Tid  Allergies (verified): 1)  ! * Nkda  Past History:  Past medical, surgical, family and social histories (including risk factors) reviewed, and no changes noted (except as noted below).  Past Medical History: Reviewed history from 08/01/2007 and no changes required. Anemia-iron deficiency Diabetes mellitus, type II Headache Hypertension  Past Surgical History: Reviewed history from 06/30/2007 and no changes required. Appendectomy Caesarean section Tubal ligation  Family History: Reviewed history from 08/01/2007 and no changes required. Family History Diabetes 1st degree relative Family History of Asthma Family History Hypertension  Social History:  Reviewed history from 06/30/2007 and no changes required. Former Smoker Alcohol use-no Occupation: Divorced  Review of Systems  The patient denies anorexia, fever, weight loss, weight gain, vision loss, decreased hearing, hoarseness, chest pain, syncope, dyspnea on exertion, peripheral edema, prolonged cough, headaches, hemoptysis, abdominal pain, melena,  hematochezia, severe indigestion/heartburn, hematuria, incontinence, genital sores, muscle weakness, suspicious skin lesions, transient blindness, difficulty walking, depression, unusual weight change, abnormal bleeding, enlarged lymph nodes, angioedema, and breast masses.    Physical Exam  General:  Well-developed,well-nourished,in no acute distress; alert,appropriate and cooperative throughout examination Head:  Normocephalic and atraumatic without obvious abnormalities. No apparent alopecia or balding. Eyes:  pupils equal and pupils round.   Ears:  R ear normal and L ear normal.   Nose:  no external erythema.   Mouth:  pharynx pink and moist.   Neck:  No deformities, masses, or tenderness noted. Lungs:  normal respiratory effort, normal breath sounds, and no wheezes.   Heart:  normal rate, regular rhythm, and Grade  1 /6 systolic ejection murmur.   Abdomen:  soft, no guarding, no rigidity, and distended.     Impression & Recommendations:  Problem # 1:  HYPERTENSION (ICD-401.9)  Her updated medication list for this problem includes:    Benicar Hct 40-25 Mg Tabs (Olmesartan medoxomil-hctz) ..... One by mouth daily  BP today: 142/84 Prior BP: 140/80 (04/10/2010)  Prior 10 Yr Risk Heart Disease: 9 % (04/10/2010)  Labs Reviewed: K+: 3.9 (12/05/2009) Creat: : 0.8 (12/05/2009)   Chol: 167 (09/05/2006)   HDL: 43.4 (09/05/2006)   LDL: 106 (09/05/2006)   TG: 88 (09/05/2006)  Problem # 2:  DIABETES MELLITUS, TYPE II (ICD-250.00)  The patient's diabetes has been historically difficult to control and we have added Lantus one for which she is now taking twice a day without good control of her blood glucoses so therefore we are going to attempt a 70/30 twice a day regimen with a sliding scale component The following medications were removed from the medication list:    Lantus 100 Unit/ml Soln (Insulin glargine) .Marland KitchenMarland KitchenMarland KitchenMarland Kitchen 40 u subcutaneously  at night Her updated medication list for this problem  includes:    Benicar Hct 40-25 Mg Tabs (Olmesartan medoxomil-hctz) ..... One by mouth daily    Kombiglyze Xr 03-999 Mg Xr24h-tab (Saxagliptin-metformin) ..... One by mouth daily    Novolog Mix 70/30 Flexpen 70-30 % Susp (Insulin aspart prot & aspart) ..... Baseline injection is 40 units 2  for breakfast and before supper.  blood sugars between 202 50 increase  to 45 u if blood sugar is between 250 and 300 increase to 50u  Orders: Venipuncture [id number]) TLB-BMP (Basic Metabolic Panel-BMET) (99991111) TLB-A1C / Hgb A1C (Glycohemoglobin) (83036-A1C)  Labs Reviewed: Creat: 0.8 (12/05/2009)    Reviewed HgBA1c results: 11.9 (04/03/2010)  13.1 (12/05/2009)  Complete Medication List: 1)  Benicar Hct 40-25 Mg Tabs (Olmesartan medoxomil-hctz) .... One by mouth daily 2)  Kombiglyze Xr 03-999 Mg Xr24h-tab (Saxagliptin-metformin) .... One by mouth daily 3)  Onetouch Ultra Test Strp (Glucose blood) .... Use as directed tid 4)  Novolog Mix 70/30 Flexpen 70-30 % Susp (Insulin aspart prot & aspart) .... Baseline injection is 40 units 2  for breakfast and before supper.  blood sugars between 202 50 increase  to 45 u if blood sugar is between 250 and 300 increase to 50u  Other Orders: TLB-CBC Platelet - w/Differential (85025-CBCD) TLB-IBC Pnl (Iron/FE;Transferrin) (83550-IBC)  Hypertension Assessment/Plan:      The patient's hypertensive risk group is category  C: Target organ damage and/or diabetes.  Her calculated 10 year risk of coronary heart disease is 9 %.  Today's blood pressure is 142/84.  Her blood pressure goal is < 130/80.  Patient Instructions: 1)  Please schedule a follow-up appointment in 6 weeks. please bring your glucometer so that we can review your glucose readings at the next meeting   Orders Added: 1)  Venipuncture [36415] 2)  TLB-BMP (Basic Metabolic Panel-BMET) [id number] 3)  TLB-A1C / Hgb A1C (Glycohemoglobin) [83036-A1C] 4)  TLB-CBC Platelet - w/Differential  [85025-CBCD] 5)  TLB-IBC Pnl (Iron/FE;Transferrin) [83550-IBC] 6)  Est. Patient Level IV [id number]  Appended Document: Orders Update    Clinical Lists Changes  Orders: Added new Service order of Specimen Handling ([id number]) - Signed

## 2011-01-03 ENCOUNTER — Telehealth: Payer: Self-pay | Admitting: *Deleted

## 2011-01-03 NOTE — Telephone Encounter (Signed)
Pt would like to have a cough syrup.  Has complaints of sinus drainage, and cough x several weeks.

## 2011-01-03 NOTE — Telephone Encounter (Signed)
Delsym  OTC is best for diabetics

## 2011-01-04 NOTE — Telephone Encounter (Signed)
Left message on machine for pt

## 2011-01-19 ENCOUNTER — Telehealth: Payer: Self-pay | Admitting: Internal Medicine

## 2011-01-19 MED ORDER — INSULIN ASPART PROT & ASPART (70-30 MIX) 100 UNIT/ML ~~LOC~~ SUSP: 40.0000 [IU] | Freq: Two times a day (BID) | SUBCUTANEOUS | Status: DC

## 2011-01-19 NOTE — Telephone Encounter (Signed)
sent 

## 2011-01-19 NOTE — Telephone Encounter (Signed)
Pt requesting rx for novolog 70/30 pen sent to [facility]

## 2011-02-14 ENCOUNTER — Encounter: Payer: Self-pay | Admitting: Internal Medicine

## 2011-02-15 ENCOUNTER — Ambulatory Visit (INDEPENDENT_AMBULATORY_CARE_PROVIDER_SITE_OTHER): Payer: BC Managed Care – PPO | Admitting: Internal Medicine

## 2011-02-15 ENCOUNTER — Encounter: Payer: Self-pay | Admitting: Internal Medicine

## 2011-02-15 VITALS — BP 144/74 | HR 76 | Temp 98.2°F | Resp 16 | Ht 63.5 in | Wt 240.0 lb

## 2011-02-15 DIAGNOSIS — E119 Type 2 diabetes mellitus without complications: Secondary | ICD-10-CM

## 2011-02-15 DIAGNOSIS — I10 Essential (primary) hypertension: Secondary | ICD-10-CM

## 2011-02-15 LAB — POCT URINALYSIS DIPSTICK
Bilirubin, UA: NEGATIVE
Ketones, UA: NEGATIVE
Spec Grav, UA: 1.02

## 2011-02-15 LAB — BASIC METABOLIC PANEL
CO2: 29 mEq/L (ref 19–32)
Chloride: 107 mEq/L (ref 96–112)
Glucose, Bld: 156 mg/dL — ABNORMAL HIGH (ref 70–99)
Sodium: 141 mEq/L (ref 135–145)

## 2011-02-15 LAB — HEMOGLOBIN A1C: Hgb A1c MFr Bld: 9 % — ABNORMAL HIGH (ref 4.6–6.5)

## 2011-02-15 NOTE — Assessment & Plan Note (Signed)
Since she has been on 40 units twice daily her sugars have stabilized with the NovoLog Flex pen we'll continue this monitor an A1c today

## 2011-02-15 NOTE — Progress Notes (Signed)
  Subjective:    Patient ID: Abigail Yoder, female    DOB: [birth year], 45 y.o.   MRN: [id number]  HPI The patient has been monitoring her blood glucose isn't taking her insulin appropriately this has resulted in a greater knowledge of the effect of diet and missed doses on her blood glucoses her average blood glucose is 147 which should result in a significant reduced reduction in her A1c    Review of Systems  Constitutional: Negative for activity change, appetite change and fatigue.  HENT: Negative for ear pain, congestion, neck pain, postnasal drip and sinus pressure.   Eyes: Negative for redness and visual disturbance.  Respiratory: Negative for cough, shortness of breath and wheezing.   Gastrointestinal: Negative for abdominal pain and abdominal distention.  Genitourinary: Negative for dysuria, frequency and menstrual problem.  Musculoskeletal: Negative for myalgias, joint swelling and arthralgias.  Skin: Negative for rash and wound.  Neurological: Negative for dizziness, weakness and headaches.  Hematological: Negative for adenopathy. Does not bruise/bleed easily.  Psychiatric/Behavioral: Negative for sleep disturbance and decreased concentration.       Past Medical History  Diagnosis Date  . Anemia   . Diabetes mellitus   . Headache   . Hypertension    Past Surgical History  Procedure Date  . Appendectomy   . Cesarean section   . Tubal ligation     reports that she quit smoking about 16 years ago. She does not have any smokeless tobacco history on file. She reports that she does not drink alcohol. Her drug history not on file. family history includes Arthritis in her mother; Diabetes in her mother; Heart disease in her father; Hyperlipidemia in her father; and Hypertension in her father. No Known Allergies  Objective:   Physical Exam  Constitutional: She is oriented to person, place, and time. She appears well-developed and well-nourished. No distress.  HENT:    Head: Normocephalic and atraumatic.  Right Ear: External ear normal.  Left Ear: External ear normal.  Nose: Nose normal.  Mouth/Throat: Oropharynx is clear and moist.  Eyes: Conjunctivae and EOM are normal. Pupils are equal, round, and reactive to light.  Neck: Normal range of motion. Neck supple. No JVD present. No tracheal deviation present. No thyromegaly present.  Cardiovascular: Normal rate, regular rhythm, normal heart sounds and intact distal pulses.   No murmur heard. Pulmonary/Chest: Effort normal and breath sounds normal. She has no wheezes. She exhibits no tenderness.  Abdominal: Soft. Bowel sounds are normal.  Musculoskeletal: Normal range of motion. She exhibits no edema and no tenderness.  Lymphadenopathy:    She has no cervical adenopathy.  Neurological: She is alert and oriented to person, place, and time. She has normal reflexes. No cranial nerve deficit.  Skin: Skin is warm and dry. She is not diaphoretic.  Psychiatric: She has a normal mood and affect. Her behavior is normal.          Assessment & Plan:

## 2011-02-15 NOTE — Assessment & Plan Note (Signed)
Blood pressure is stable on medications with need for renal monitering

## 2011-02-27 ENCOUNTER — Encounter: Payer: Self-pay | Admitting: Family Medicine

## 2011-02-27 ENCOUNTER — Ambulatory Visit (INDEPENDENT_AMBULATORY_CARE_PROVIDER_SITE_OTHER): Payer: BC Managed Care – PPO | Admitting: Family Medicine

## 2011-02-27 VITALS — BP 140/68 | Temp 99.0°F

## 2011-02-27 DIAGNOSIS — H109 Unspecified conjunctivitis: Secondary | ICD-10-CM

## 2011-02-27 DIAGNOSIS — H10029 Other mucopurulent conjunctivitis, unspecified eye: Secondary | ICD-10-CM

## 2011-02-27 MED ORDER — POLYMYXIN B-TRIMETHOPRIM 10000-0.1 UNIT/ML-% OP SOLN: 2.0000 [drp] | OPHTHALMIC | Status: AC

## 2011-02-27 NOTE — Progress Notes (Signed)
  Subjective:    Patient ID: Abigail Yoder, female    DOB: 1966/10/09, 45 y.o.   MRN: [id number]  HPI Patient seen with left eye redness and drainage past couple days.  Some matting of lid early in the morning improved with warm compresses Granddaughter possibly with pink. Was exposed over the weekend. No eye pain. No blurred vision. She has some nasal congestion which he thinks may be allergy related. No fever or chills. No contact use.   Review of Systems  Constitutional: Negative for fever and chills.  Eyes: Positive for discharge and redness. Negative for photophobia, pain and visual disturbance.  Respiratory: Negative for shortness of breath.   Neurological: Negative for headaches.       Objective:   Physical Exam  Constitutional: She appears well-developed and well-nourished.  HENT:  Head: Normocephalic and atraumatic.  Right Ear: External ear normal.  Left Ear: External ear normal.  Eyes:       Left conjunctiva erythematous. No purulent drainage at this time. Left conjunctiva normal. Pupils equal round reactive to light.  Pulmonary/Chest: Effort normal and breath sounds normal. She has no wheezes. She has no rales.  Musculoskeletal: She exhibits no edema.          Assessment & Plan:  Probable bacterial conjunctivitis left. Continue warm compresses. Polytrim ophthalmic drops 2 drops left eye every 4 hours while awake

## 2011-02-27 NOTE — Patient Instructions (Signed)
Conjunctivitis Conjunctivitis is commonly called "pink eye." Conjunctivitis can be caused by bacterial or viral infection, allergies, or injuries. There is usually redness of the lining of the eye, itching, discomfort, and sometimes discharge. There may be deposits of matter along the eyelids. A viral infection usually causes a watery discharge, while a bacterial infection causes a yellowish, thick discharge. Pink eye is very contagious and spreads by direct contact. You may be given antibiotic eyedrops as part of your treatment. Before using your eye medicine, remove all drainage from the eye by washing gently with warm water and cotton balls. Continue to use the medication until you have awakened 2 mornings in a row without discharge from the eye. Do not rub your eye. This increases the irritation and helps spread infection. Use separate towels from other household members. Wash your hands with soap and water before and after touching your eyes. Use cold compresses to reduce pain and sunglasses to relieve irritation from light. Do not wear contact lenses or wear eye makeup until the infection is gone. SEEK MEDICAL CARE IF:  Your symptoms are not better after 3 days of treatment.   You have increased pain or trouble seeing.   The outer eyelids become very red or swollen.  Document Released: 12/13/2004 Document Re-Released: 01/30/2010 [facility] Patient Information 2011 [place].

## 2011-03-07 ENCOUNTER — Telehealth: Payer: Self-pay | Admitting: Internal Medicine

## 2011-03-07 MED ORDER — AZITHROMYCIN 500 MG PO TABS: 500.0000 mg | ORAL_TABLET | Freq: Every day | ORAL | Status: AC

## 2011-03-07 MED ORDER — PHENYLEPHRINE-DM-GG 2.5-5-100 MG/5ML PO LIQD: ORAL | Status: DC

## 2011-03-07 NOTE — Telephone Encounter (Signed)
azthromicin 500 for 3 days use mucinex cough sand cold fast max

## 2011-03-07 NOTE — Telephone Encounter (Signed)
Pt has cough and fever she is requesting something to be call into [facility] [address] [phone]. Pt decline to see another doc.

## 2011-05-07 ENCOUNTER — Telehealth: Payer: Self-pay | Admitting: *Deleted

## 2011-05-07 NOTE — Telephone Encounter (Signed)
Checking the status of order faxed over 2 wks ago for diabetic testing supplies.  Please fax back asap, please advise if order needs to be re faxed.

## 2011-05-07 NOTE — Telephone Encounter (Signed)
This is a company that pt does not want to use and they keep calling her- if they call again ,tell them no- I have already told them that once.[place]

## 2011-05-08 NOTE — Telephone Encounter (Signed)
Spoke with Freida Busman and advised pt did not request and to refrain from calling in the future.

## 2011-05-17 ENCOUNTER — Ambulatory Visit (INDEPENDENT_AMBULATORY_CARE_PROVIDER_SITE_OTHER): Payer: BC Managed Care – PPO | Admitting: Internal Medicine

## 2011-05-17 ENCOUNTER — Encounter: Payer: Self-pay | Admitting: Internal Medicine

## 2011-05-17 VITALS — BP 132/80 | HR 76 | Temp 98.2°F | Resp 16 | Ht 64.0 in | Wt 244.0 lb

## 2011-05-17 DIAGNOSIS — I10 Essential (primary) hypertension: Secondary | ICD-10-CM

## 2011-05-17 DIAGNOSIS — D509 Iron deficiency anemia, unspecified: Secondary | ICD-10-CM

## 2011-05-17 DIAGNOSIS — E119 Type 2 diabetes mellitus without complications: Secondary | ICD-10-CM

## 2011-05-17 DIAGNOSIS — E669 Obesity, unspecified: Secondary | ICD-10-CM | POA: Insufficient documentation

## 2011-05-17 LAB — CBC WITH DIFFERENTIAL/PLATELET
Basophils Relative: 0.2 % (ref 0.0–3.0)
Eosinophils Relative: 1.5 % (ref 0.0–5.0)
HCT: 32.3 % — ABNORMAL LOW (ref 36.0–46.0)
Hemoglobin: 10.7 g/dL — ABNORMAL LOW (ref 12.0–15.0)
Lymphs Abs: 1.6 10*3/uL (ref 0.7–4.0)
Monocytes Relative: 5.5 % (ref 3.0–12.0)
Neutro Abs: 3.5 10*3/uL (ref 1.4–7.7)
Platelets: 216 10*3/uL (ref 150.0–400.0)
RBC: 3.49 Mil/uL — ABNORMAL LOW (ref 3.87–5.11)
WBC: 5.6 10*3/uL (ref 4.5–10.5)

## 2011-05-17 LAB — HEMOGLOBIN A1C: Hgb A1c MFr Bld: 9.5 % — ABNORMAL HIGH (ref 4.6–6.5)

## 2011-05-17 MED ORDER — PHENTERMINE HCL 37.5 MG PO TABS: 37.5000 mg | ORAL_TABLET | Freq: Every day | ORAL | Status: DC

## 2011-05-17 NOTE — Progress Notes (Signed)
  Subjective:    Patient ID: Abigail Yoder, female    DOB: 09-Aug-1966, 45 y.o.   MRN: [id number]  HPI Agent presents for followup with good blood pressure monitoring of blood glucose on now twice a day insulin.  Her blood pressures well controlled she is also in need of monitoring prior deficiency anemia.  She has noticed over the past 6 months a marked increase in weight most of that weight was put on between January and March since March has gained 4 pounds therefore it is hard to correlate the weight gain with the institution of the twice daily insulin.  Their other factors including perimenopausal status I stress and possibly stress eating but we must consider   Review of Systems  Constitutional: Negative for activity change, appetite change and fatigue.  HENT: Negative for ear pain, congestion, neck pain, postnasal drip and sinus pressure.   Eyes: Negative for redness and visual disturbance.  Respiratory: Negative for cough, shortness of breath and wheezing.   Gastrointestinal: Negative for abdominal pain and abdominal distention.  Genitourinary: Negative for dysuria, frequency and menstrual problem.  Musculoskeletal: Negative for myalgias, joint swelling and arthralgias.  Skin: Negative for rash and wound.  Neurological: Negative for dizziness, weakness and headaches.  Hematological: Negative for adenopathy. Does not bruise/bleed easily.  Psychiatric/Behavioral: Negative for sleep disturbance and decreased concentration.   Past Medical History  Diagnosis Date  . Anemia   . Diabetes mellitus   . Headache   . Hypertension    Past Surgical History  Procedure Date  . Appendectomy   . Cesarean section   . Tubal ligation     reports that she quit smoking about 16 years ago. She does not have any smokeless tobacco history on file. She reports that she does not drink alcohol. Her drug history not on file. family history includes Arthritis in her mother; Diabetes in her  mother; Heart disease in her father; Hyperlipidemia in her father; and Hypertension in her father. No Known Allergies     Objective:   Physical Exam  Constitutional: She is oriented to person, place, and time. She appears well-developed and well-nourished. No distress.  HENT:  Head: Normocephalic and atraumatic.  Right Ear: External ear normal.  Left Ear: External ear normal.  Nose: Nose normal.  Mouth/Throat: Oropharynx is clear and moist.  Eyes: Conjunctivae and EOM are normal. Pupils are equal, round, and reactive to light.  Neck: Normal range of motion. Neck supple. No JVD present. No tracheal deviation present. No thyromegaly present.  Cardiovascular: Normal rate, regular rhythm, normal heart sounds and intact distal pulses.   No murmur heard. Pulmonary/Chest: Effort normal and breath sounds normal. She has no wheezes. She exhibits no tenderness.  Abdominal: Soft. Bowel sounds are normal.  Musculoskeletal: Normal range of motion. She exhibits no edema and no tenderness.  Lymphadenopathy:    She has no cervical adenopathy.  Neurological: She is alert and oriented to person, place, and time. She has normal reflexes. No cranial nerve deficit.  Skin: Skin is warm and dry. She is not diaphoretic.  Psychiatric: She has a normal mood and affect. Her behavior is normal.          Assessment & Plan:  30 min of educations over the concept of eat to live vs live to eat discussion if adipex use monitor a1c and cbc Rule out anemia as a complicating factor

## 2011-08-23 ENCOUNTER — Ambulatory Visit (INDEPENDENT_AMBULATORY_CARE_PROVIDER_SITE_OTHER): Payer: BC Managed Care – PPO | Admitting: Internal Medicine

## 2011-08-23 ENCOUNTER — Encounter: Payer: Self-pay | Admitting: Internal Medicine

## 2011-08-23 VITALS — BP 140/70 | HR 76 | Temp 98.4°F | Resp 16 | Ht 63.5 in | Wt 239.0 lb

## 2011-08-23 DIAGNOSIS — E1165 Type 2 diabetes mellitus with hyperglycemia: Secondary | ICD-10-CM

## 2011-08-23 DIAGNOSIS — E669 Obesity, unspecified: Secondary | ICD-10-CM

## 2011-08-23 DIAGNOSIS — I10 Essential (primary) hypertension: Secondary | ICD-10-CM

## 2011-08-23 LAB — BASIC METABOLIC PANEL
CO2: 27 mEq/L (ref 19–32)
Calcium: 9.7 mg/dL (ref 8.4–10.5)
Creatinine, Ser: 0.9 mg/dL (ref 0.4–1.2)
GFR: 86.87 mL/min (ref >60.00)
Glucose, Bld: 143 mg/dL — ABNORMAL HIGH (ref 70–99)

## 2011-08-23 MED ORDER — PHENTERMINE HCL 37.5 MG PO TABS: 37.5000 mg | ORAL_TABLET | Freq: Every day | ORAL | Status: AC

## 2011-08-23 NOTE — Progress Notes (Signed)
Subjective:    Patient ID: Abigail Yoder, female    DOB: June 16, 1966, 45 y.o.   MRN: [id number]  HPI Weight loss and DM follow up DM insulin requiring and requires her to administer sliding scale Multiple reports of hypoglycemia Poor diet with increased carbohydrates Blood pressure is in fair control on an ARB She is due an eye exam She does self foot exams   Review of Systems  Constitutional: Negative for activity change, appetite change and fatigue.  HENT: Negative for ear pain, congestion, neck pain, postnasal drip and sinus pressure.   Eyes: Negative for redness and visual disturbance.  Respiratory: Negative for cough, shortness of breath and wheezing.   Gastrointestinal: Negative for abdominal pain and abdominal distention.  Genitourinary: Negative for dysuria, frequency and menstrual problem.  Musculoskeletal: Negative for myalgias, joint swelling and arthralgias.  Skin: Negative for rash and wound.  Neurological: Negative for dizziness, weakness and headaches.  Hematological: Negative for adenopathy. Does not bruise/bleed easily.  Psychiatric/Behavioral: Negative for sleep disturbance and decreased concentration.   Past Medical History  Diagnosis Date  . Anemia   . Diabetes mellitus   . Headache   . Hypertension     History   Social History  . Marital Status: Divorced    Spouse Name: N/A    Number of Children: N/A  . Years of Education: N/A   Occupational History  . Not on file.   Social History Main Topics  . Smoking status: Former Smoker    Quit date: 11/19/1994  . Smokeless tobacco: Not on file  . Alcohol Use: No  . Drug Use: Not on file  . Sexually Active: Yes   Other Topics Concern  . Not on file   Social History Narrative  . No narrative on file    Past Surgical History  Procedure Date  . Appendectomy   . Cesarean section   . Tubal ligation     Family History  Problem Relation Age of Onset  . Diabetes Mother   . Arthritis Mother     . Hypertension Father   . Heart disease Father   . Hyperlipidemia Father     No Known Allergies  Current Outpatient Prescriptions on File Prior to Visit  Medication Sig Dispense Refill  . glucose blood (ONE TOUCH ULTRA TEST) test strip 1 each by Other route 3 (three) times daily. Use as instructed       . insulin aspart protamine-insulin aspart (NOVOLOG MIX 70/30 FLEXPEN) (70-30) 100 UNIT/ML injection Inject 40 Units into the skin 2 (two) times daily.  10 mL  12  . olmesartan-hydrochlorothiazide (BENICAR HCT) 40-25 MG per tablet Take 1 tablet by mouth daily.        . Saxagliptin-Metformin (KOMBIGLYZE XR) 03-999 MG TB24 Take 1 tablet by mouth daily.          BP 140/70  Pulse 76  Temp 98.4 F (36.9 C)  Resp 16  Ht 5' 3.5" (1.613 m)  Wt 239 lb (108.41 kg)  BMI 41.67 kg/m2       Objective:   Physical Exam  Nursing note and vitals reviewed. Constitutional: She is oriented to person, place, and time. She appears well-developed and well-nourished. No distress.  HENT:  Head: Normocephalic and atraumatic.  Right Ear: External ear normal.  Left Ear: External ear normal.  Nose: Nose normal.  Mouth/Throat: Oropharynx is clear and moist.  Eyes: Conjunctivae and EOM are normal. Pupils are equal, round, and reactive to light.  Neck: Normal range  of motion. Neck supple. No JVD present. No tracheal deviation present. No thyromegaly present.  Cardiovascular: Normal rate, regular rhythm, normal heart sounds and intact distal pulses.   No murmur heard. Pulmonary/Chest: Effort normal and breath sounds normal. She has no wheezes. She exhibits no tenderness.  Abdominal: Soft. Bowel sounds are normal.  Musculoskeletal: Normal range of motion. She exhibits no edema and no tenderness.  Lymphadenopathy:    She has no cervical adenopathy.  Neurological: She is alert and oriented to person, place, and time. She has normal reflexes. No cranial nerve deficit.  Skin: Skin is warm and dry. She is not  diaphoretic.  Psychiatric: She has a normal mood and affect. Her behavior is normal.          Assessment & Plan:  DM poorly controlled. monitor  Today Recommend eye exam Stable blood pressure but will monitor for renal insufficiency with bmet

## 2011-08-23 NOTE — Patient Instructions (Addendum)
limit salt Use sea salt in food to reduce your fluid retention  Your insulin should be at dinner and at breakfast... Not waiting

## 2011-08-24 LAB — HEMOGLOBIN A1C: Hgb A1c MFr Bld: 10.8 % — ABNORMAL HIGH (ref 4.6–6.5)

## 2011-11-22 ENCOUNTER — Ambulatory Visit: Payer: BC Managed Care – PPO | Admitting: Internal Medicine

## 2012-02-18 ENCOUNTER — Other Ambulatory Visit (INDEPENDENT_AMBULATORY_CARE_PROVIDER_SITE_OTHER): Payer: BC Managed Care – PPO

## 2012-02-18 DIAGNOSIS — Z0000 Encounter for general adult medical examination without abnormal findings: Secondary | ICD-10-CM

## 2012-02-18 LAB — POCT URINALYSIS DIPSTICK
Bilirubin, UA: NEGATIVE
Nitrite, UA: POSITIVE
Spec Grav, UA: 1.02

## 2012-02-18 LAB — CBC WITH DIFFERENTIAL/PLATELET
Eosinophils Relative: 0.9 % (ref 0.0–5.0)
Monocytes Relative: 6.4 % (ref 3.0–12.0)
Neutrophils Relative %: 66.5 % (ref 43.0–77.0)
Platelets: 233 10*3/uL (ref 150.0–400.0)
WBC: 7 10*3/uL (ref 4.5–10.5)

## 2012-02-18 LAB — LIPID PANEL
HDL: 50.8 mg/dL (ref >39.00)
LDL Cholesterol: 64 mg/dL (ref 0–99)
Total CHOL/HDL Ratio: 3
Triglycerides: 169 mg/dL — ABNORMAL HIGH (ref 0.0–149.0)

## 2012-02-18 LAB — HEPATIC FUNCTION PANEL
ALT: 22 U/L (ref 0–35)
AST: 22 U/L (ref 0–37)
Alkaline Phosphatase: 106 U/L (ref 39–117)
Bilirubin, Direct: 0 mg/dL (ref 0.0–0.3)
Total Bilirubin: 0.1 mg/dL — ABNORMAL LOW (ref 0.3–1.2)

## 2012-02-18 LAB — BASIC METABOLIC PANEL
BUN: 14 mg/dL (ref 6–23)
Creatinine, Ser: 0.8 mg/dL (ref 0.4–1.2)
GFR: 99.3 mL/min (ref >60.00)
Potassium: 4.8 mEq/L (ref 3.5–5.1)

## 2012-02-19 ENCOUNTER — Encounter: Payer: Self-pay | Admitting: Internal Medicine

## 2012-02-20 ENCOUNTER — Encounter: Payer: Self-pay | Admitting: Internal Medicine

## 2012-02-20 ENCOUNTER — Ambulatory Visit (INDEPENDENT_AMBULATORY_CARE_PROVIDER_SITE_OTHER): Payer: BC Managed Care – PPO | Admitting: Internal Medicine

## 2012-02-20 VITALS — BP 160/80 | HR 100 | Temp 98.5°F | Resp 16 | Ht 64.0 in | Wt 232.0 lb

## 2012-02-20 DIAGNOSIS — D509 Iron deficiency anemia, unspecified: Secondary | ICD-10-CM

## 2012-02-20 DIAGNOSIS — E119 Type 2 diabetes mellitus without complications: Secondary | ICD-10-CM

## 2012-02-20 DIAGNOSIS — IMO0002 Reserved for concepts with insufficient information to code with codable children: Secondary | ICD-10-CM

## 2012-02-20 DIAGNOSIS — Z0000 Encounter for general adult medical examination without abnormal findings: Secondary | ICD-10-CM

## 2012-02-20 DIAGNOSIS — E669 Obesity, unspecified: Secondary | ICD-10-CM

## 2012-02-20 DIAGNOSIS — I10 Essential (primary) hypertension: Secondary | ICD-10-CM

## 2012-02-20 DIAGNOSIS — E1169 Type 2 diabetes mellitus with other specified complication: Secondary | ICD-10-CM

## 2012-02-20 MED ORDER — INSULIN ASPART PROT & ASPART (70-30 MIX) 100 UNIT/ML ~~LOC~~ SUSP: 40.0000 [IU] | Freq: Two times a day (BID) | SUBCUTANEOUS | Status: DC

## 2012-02-20 NOTE — Progress Notes (Signed)
Subjective:    Patient ID: Abigail Yoder, female    DOB: [birth year], 46 y.o.   MRN: [id number]  HPI Patient is a 46 year old African American female who presents for followup of her diabetes iron deficiency anemia chronic hypertension morbid obesity and medical noncompliance.  She admits that she is noncompliant with her diet she states that it's hard to make food for herself and for her family and that she has difficulty falling diabetic diet she states that she "wants to eat all the time" and admits that she does not always take her insulin on a scheduled basis.  Presents a long-standing history of noncompliance and we will refer her to diabetic teaching and consider putting her on case management at all possible. She needs one-on-one work because her renal function is starting to suffer and I believe she will develop complications over the next 2 years if we cannot get her diabetes much better control.  She also presents for her yearly wellness examination   Review of Systems  Constitutional: Negative for activity change, appetite change and fatigue.  HENT: Negative for ear pain, congestion, neck pain, postnasal drip and sinus pressure.   Eyes: Negative for redness and visual disturbance.  Respiratory: Negative for cough, shortness of breath and wheezing.   Gastrointestinal: Negative for abdominal pain and abdominal distention.  Genitourinary: Negative for dysuria, frequency and menstrual problem.  Musculoskeletal: Negative for myalgias, joint swelling and arthralgias.  Skin: Negative for rash and wound.  Neurological: Negative for dizziness, weakness and headaches.  Hematological: Negative for adenopathy. Does not bruise/bleed easily.  Psychiatric/Behavioral: Negative for sleep disturbance and decreased concentration.       Objective:   Physical Exam  Nursing note and vitals reviewed. Constitutional: She is oriented to person, place, and time. She appears well-developed and  well-nourished. No distress.       Morbidly obese African American female  HENT:  Head: Normocephalic and atraumatic.  Right Ear: External ear normal.  Left Ear: External ear normal.  Nose: Nose normal.  Mouth/Throat: Oropharynx is clear and moist.  Eyes: Conjunctivae and EOM are normal. Pupils are equal, round, and reactive to light.  Neck: Normal range of motion. Neck supple. No JVD present. No tracheal deviation present. No thyromegaly present.  Cardiovascular: Normal rate, regular rhythm, normal heart sounds and intact distal pulses.   No murmur heard. Pulmonary/Chest: Effort normal and breath sounds normal. She has no wheezes. She exhibits no tenderness.  Abdominal: Soft. Bowel sounds are normal.  Musculoskeletal: Normal range of motion. She exhibits no edema and no tenderness.  Lymphadenopathy:    She has no cervical adenopathy.  Neurological: She is alert and oriented to person, place, and time. She has normal reflexes. No cranial nerve deficit.  Skin: Skin is warm and dry. She is not diaphoretic.  Psychiatric: She has a normal mood and affect. Her behavior is normal.   Diabetic foot exam:  Left: Reflexes 3+   Vibratory sensation diminished  Proprioception diminished  Sharp/dull discrimination diminished  Filament test present Right: Reflexes 2+   Vibratory sensation diminished  Proprioception diminished  Sharp/dull discrimination diminished  Filament test present        Assessment & Plan:   Patient presents for yearly preventative medicine examination.   all immunizations and health maintenance protocols were reviewed with the patient and they are up to date with these protocols.   screening laboratory values were reviewed with the patient including screening of hyperlipidemia PSA renal function and hepatic function.  There medications past medical history social history problem list and allergies were reviewed in detail.   Goals were established with regard to  weight loss exercise diet in compliance with medications  The patient has extremely poorly controlled diabetes and we spent significant time counseling the patient about her need for diabetic control.  We will add a long acting Lantus insulin to her 7030 NovoLog and refer her to diabetic teaching clinic teacher to better monitor her insulin and better consistent control with diet so that she can then pursue a sliding scale insulin for tight control

## 2012-02-20 NOTE — Patient Instructions (Addendum)
The patient is instructed to continue all medications as prescribed. Schedule followup with check out clerk upon leaving the clinic   You must follow a DM diet   Will add  levemir  25 units at night and continue the novolog twice a day Levemir is a long acting insulin that works over 24 hours.

## 2012-02-21 ENCOUNTER — Telehealth: Payer: Self-pay | Admitting: Internal Medicine

## 2012-02-21 NOTE — Telephone Encounter (Signed)
Pt was in to see Dr Arnoldo Morale yesterday on 02/20/12 and dx pt with a uti, but did not prescribe any med for it, just the insulin. Can Dr Arnoldo Morale pls call in med for uti to [facility] on [address]

## 2012-02-22 MED ORDER — CIPROFLOXACIN HCL 250 MG PO TABS: 250.0000 mg | ORAL_TABLET | Freq: Two times a day (BID) | ORAL | Status: DC

## 2012-02-22 MED ORDER — CIPROFLOXACIN HCL 250 MG PO TABS: 250.0000 mg | ORAL_TABLET | Freq: Two times a day (BID) | ORAL | Status: AC

## 2012-02-22 NOTE — Telephone Encounter (Signed)
Per dr Arnoldo Morale- please give cipro 250 bid for 3 days

## 2012-02-22 NOTE — Telephone Encounter (Signed)
Left message on machine for patient that rx sent

## 2012-02-25 ENCOUNTER — Telehealth: Payer: Self-pay | Admitting: Internal Medicine

## 2012-02-25 MED ORDER — GLUCOSE BLOOD VI STRP: ORAL_STRIP | Status: AC

## 2012-02-25 MED ORDER — INSULIN DETEMIR 100 UNIT/ML ~~LOC~~ SOLN: 25.0000 [IU] | Freq: Every day | SUBCUTANEOUS | Status: DC

## 2012-02-25 NOTE — Telephone Encounter (Signed)
Pt req refill of insulin detemir (LEVEMIR) 100 UNIT/ML injection and One Touch Verio Test strips to [facility] on [address].

## 2012-02-25 NOTE — Telephone Encounter (Signed)
Done

## 2012-02-28 ENCOUNTER — Encounter: Payer: Self-pay | Admitting: *Deleted

## 2012-02-28 ENCOUNTER — Encounter: Payer: BC Managed Care – PPO | Attending: Internal Medicine | Admitting: *Deleted

## 2012-02-28 VITALS — Ht 64.0 in | Wt 235.2 lb

## 2012-02-28 DIAGNOSIS — Z713 Dietary counseling and surveillance: Secondary | ICD-10-CM | POA: Insufficient documentation

## 2012-02-28 DIAGNOSIS — E119 Type 2 diabetes mellitus without complications: Secondary | ICD-10-CM

## 2012-02-28 NOTE — Patient Instructions (Addendum)
Goals:  Follow Diabetes Meal Plan as instructed (see yellow card) -  Eat 3 meals and 2 snacks, every 3-5 hrs -  Limit carbohydrate intake to 45 grams per meal; 15 grams per snack -  Measure portions of carbs to see how much you're actually eating  Limit/avoid sugar sweetened drinks and concentrated sweets.   Add lean protein foods to all meals/snacks  Aim for >30 mins of physical activity daily  Bring food record and glucose log to your next nutrition visit -  Test blood glucose first thing in the morning, before your largest meal, and 2 hours after the first     bite of that same meal. Can vary which meal.

## 2012-02-28 NOTE — Progress Notes (Addendum)
Medical Nutrition Therapy:  Appt start time: 0830 end time:  0945.  Assessment:  Primary concerns today: T2DM w/ other specified manifestations, uncontrolled (250.82).  Myrka here today with recent A1c of 12.1% (02/18/12) for assessment of T2DM. Works in [employer] and appears to have knowledge of DM and medicines. Skips meals when busy at work and on weekends. Consumes high CHO foods at times (i.e. sweet tea, dinner rolls, etc). Med regimen includes: 40 units Novolog (w/ B & L), 03-999 mg tab Kombiglyze (w/ B), and 25 units Levemir (@ hs).  Checks BG 3-4 times/day and meter shows FBG of 154, 115, 185 mg and pre-prandial of 105-154 mg over last 3 days.   Diabetes Latest Ref Rng 02/18/2012 08/23/2011 05/17/2011  HbA1c 4.6 - 6.5 % 12.1 (H) 10.8 (H) 9.5 (H)   02/15/2011 12/18/2010 04/03/2010 12/05/2009  9.0 (H) 13.7 (H) 11.9 (H) 13.1 (H)   MEDICATIONS: See medication list. Reports she has completed Cipro treatment.   DIETARY INTAKE:  Usual eating pattern includes 3 meals and 0-2 snacks per day.  24-hr recall:  B (7-7:15 AM): Coffee w/ truvia & creamer; sausage w/ scrambled eggs  Snk (10 AM): Apple or orange (30 g)  L (1-1:30 PM): Michaelina's meal - [place] steak and mashed potatoes Snk (3 PM): Apple D (PM): Steak (6-8 oz), green beans, salad, crystal light Snk (PM): n/a Beverages: Crystal light  Usual physical activity: None  Estimated energy needs: 1200-1400 calories 135-160 g carbohydrates 80-95 g protein 40-45 g fat  Progress Towards Goal(s):  In progress.   Nutritional Diagnosis:  [place]-2.1 Impaired nutrient utilization related to glucose metabolism as evidenced by recent A1c of 12.1% and excessive intake of high CHO foods.    Intervention/Goals:  Follow Diabetes Meal Plan as instructed (see yellow card) -  Eat 3 meals and 2 snacks, every 3-5 hrs -  Limit carbohydrate intake to 45 grams per meal -  Limit carbohydrate intake to 15 grams per snack  Limit/avoid sugar  sweetened drinks and concentrated sweets.   Avoid fried/high fat foods.   Add lean protein foods to all meals/snacks  Aim for >30 mins of physical activity daily  Bring food record and glucose log to your next nutrition visit -  Test blood glucose first thing in the morning, before your largest meal, and 2 hours after the first bite of that same meal. Can vary which meal.  Handouts given during visit include:  Living Well with Diabetes  Low CHO Snack List  CHO Meal Plans  Samples Dispensed:   Boost Calorie Smart: 2 bottles Lot # [id number]; Exp: 12/10/12  Monitoring/Evaluation:  Dietary intake, exercise, A1c, BG trends, and body weight in 4 week(s).

## 2012-03-27 ENCOUNTER — Ambulatory Visit: Payer: BC Managed Care – PPO | Admitting: *Deleted

## 2012-04-01 ENCOUNTER — Encounter: Payer: Self-pay | Admitting: *Deleted

## 2012-04-01 ENCOUNTER — Encounter: Payer: BC Managed Care – PPO | Attending: Internal Medicine | Admitting: *Deleted

## 2012-04-01 VITALS — Ht 64.0 in | Wt 237.5 lb

## 2012-04-01 DIAGNOSIS — E119 Type 2 diabetes mellitus without complications: Secondary | ICD-10-CM | POA: Insufficient documentation

## 2012-04-01 DIAGNOSIS — Z713 Dietary counseling and surveillance: Secondary | ICD-10-CM | POA: Insufficient documentation

## 2012-04-01 NOTE — Patient Instructions (Addendum)
Goals:  Continue previous goals.  Contact MD as soon as possible regarding hypoglycemic episodes.   Increase exercise to >30 min daily.  Limit sodium to 2000 mg or less and increase fiber to 25-30 g daily.  Add protein to all meals and snacks.  Take Iron with Vit C to increase absorption.

## 2012-04-01 NOTE — Progress Notes (Addendum)
Medical Nutrition Therapy:  Appt start time: 0800 end time:  0830.  Primary concerns today: T2DM w/ other specified manifestations, uncontrolled (250.82); Follow up.  Abigail Yoder returns today for f/u and reports multiple episodes of hypoglycemia overnight (several 40 mg readings around 2-2:30 am).  Has decreased carb intake tremendously, some meals she has none at all. Discussed adding at least 30 g to each meal, adding protein to all meals/snacks that have carbs, and advised to contact MD asap regarding hypoglycemic episodes.  Jem also reports abdominal bloating, likely d/t increase in sodium intake through soups and some eating out.  *Reports from patient's VerioIQ meter sent to [facility] and will be available under media tab.   Lab Results  Component Value Date   HGBA1C 12.1* 02/18/2012   MEDICATIONS: See medication list. Has stopped taking iron d/t constipation; states makes her feel "cold all the time".    DIETARY INTAKE:  Usual eating pattern includes 2-3 meals and 2-3 snacks per day.  24-hr recall:  B (7-7:15 AM): Coffee w/ truvia & creamer; 3pc [place] bacon, 2 boiled eggs    Snk (10 AM): Cheese stick or fruit (small tangerine or apple)   L (1-1:30 PM): Cucumber w/ lite dressing Snk (3 PM): Cucumber w/ lite dressing  D (PM):  Cubed steak (1-2 oz), mashed potatoes (1/4c), green beans Snk (PM): n/a OR fruit Beverages: Crystal light, sugar-free ICE drinks, coffee  Usual physical activity:  Reports walking "some"  Estimated energy needs: 1200-1400 calories 135-160 g carbohydrates 80-95 g protein 40-45 g fat  Progress Towards Goal(s):  In progress.   Nutritional Diagnosis:  [place]-2.1 Impaired nutrient utilization related to glucose metabolism as evidenced by recent A1c of 12.1% and excessive intake of high CHO foods.    Intervention/Goals:  Continue previous goals.  Contact MD as soon as possible regarding hypoglycemic episodes.   Increase exercise to >30 min  daily.  Limit sodium to 2000 mg or less and increase fiber to 25-30 g daily.  Add protein to all meals and snacks.  Take Iron with Vit C to increase absorption.   Monitoring/Evaluation:  Dietary intake, exercise, A1c, BG trends, and body weight in 4-6 week(s).

## 2012-04-17 ENCOUNTER — Encounter: Payer: Self-pay | Admitting: Internal Medicine

## 2012-04-17 ENCOUNTER — Other Ambulatory Visit (HOSPITAL_COMMUNITY)
Admission: RE | Admit: 2012-04-17 | Discharge: 2012-04-17 | Disposition: A | Discharge status: Home / Self Care | Payer: BC Managed Care – PPO | Source: Ambulatory Visit | Attending: Internal Medicine | Admitting: Internal Medicine

## 2012-04-17 ENCOUNTER — Ambulatory Visit (INDEPENDENT_AMBULATORY_CARE_PROVIDER_SITE_OTHER): Payer: BC Managed Care – PPO | Admitting: Internal Medicine

## 2012-04-17 VITALS — BP 140/80 | HR 76 | Temp 98.6°F | Resp 16 | Ht 63.5 in | Wt 238.0 lb

## 2012-04-17 DIAGNOSIS — D649 Anemia, unspecified: Secondary | ICD-10-CM

## 2012-04-17 DIAGNOSIS — Z01419 Encounter for gynecological examination (general) (routine) without abnormal findings: Secondary | ICD-10-CM

## 2012-04-17 DIAGNOSIS — E1169 Type 2 diabetes mellitus with other specified complication: Secondary | ICD-10-CM

## 2012-04-17 LAB — HEMOGLOBIN A1C: Hgb A1c MFr Bld: 7.2 % — ABNORMAL HIGH (ref 4.6–6.5)

## 2012-04-17 LAB — BASIC METABOLIC PANEL
BUN: 15 mg/dL (ref 6–23)
CO2: 27 mEq/L (ref 19–32)
Chloride: 106 mEq/L (ref 96–112)
Creatinine, Ser: 0.8 mg/dL (ref 0.4–1.2)

## 2012-04-17 MED ORDER — INSULIN ASPART PROT & ASPART (70-30 MIX) 100 UNIT/ML ~~LOC~~ SUSP: 35.0000 [IU] | Freq: Two times a day (BID) | SUBCUTANEOUS | Status: DC

## 2012-04-17 NOTE — Progress Notes (Signed)
Addended by: Allyne Gee on: 04/17/2012 01:00 PM   Modules accepted: Orders

## 2012-04-17 NOTE — Patient Instructions (Signed)
The patient is instructed to continue all medications as prescribed. Schedule followup with check out clerk upon leaving the clinic  

## 2012-04-17 NOTE — Progress Notes (Signed)
  Subjective:    Patient ID: Abigail Yoder, female    DOB: 1966-03-16, 46 y.o.   MRN: [id number]  HPI Patient is a 46 year old female who presents for followup of her adult type diabetes insulin requiring and of the need for fluids wellness examination including a Pap smear.  She has reported hypoglycemic episodes since she's become more cautious of her carbohydrate loading with the help of her dietitian.  We decreased her 7030 insulin doses Ancef sliding scale for her to be able to adjust those doses further we will continue her basilar insulin at its current rate   Review of Systems  Constitutional: Negative for activity change, appetite change and fatigue.  HENT: Negative for ear pain, congestion, neck pain, postnasal drip and sinus pressure.   Eyes: Negative for redness and visual disturbance.  Respiratory: Negative for cough, shortness of breath and wheezing.   Gastrointestinal: Negative for abdominal pain and abdominal distention.  Genitourinary: Negative for dysuria, frequency and menstrual problem.  Musculoskeletal: Negative for myalgias, joint swelling and arthralgias.  Skin: Negative for rash and wound.  Neurological: Negative for dizziness, weakness and headaches.  Hematological: Negative for adenopathy. Does not bruise/bleed easily.  Psychiatric/Behavioral: Negative for sleep disturbance and decreased concentration.       Objective:   Physical Exam  Constitutional: She is oriented to person, place, and time. She appears well-developed and well-nourished. No distress.  HENT:  Head: Normocephalic and atraumatic.  Right Ear: External ear normal.  Left Ear: External ear normal.  Nose: Nose normal.  Mouth/Throat: Oropharynx is clear and moist.  Eyes: Conjunctivae and EOM are normal. Pupils are equal, round, and reactive to light.  Neck: Normal range of motion. Neck supple. No JVD present. No tracheal deviation present. No thyromegaly present.  Cardiovascular: Normal  rate, regular rhythm, normal heart sounds and intact distal pulses.   No murmur heard. Pulmonary/Chest: Effort normal and breath sounds normal. She has no wheezes. She exhibits no tenderness.  Abdominal: Soft. Bowel sounds are normal.  Genitourinary: No breast swelling or discharge. There is no rash, tenderness or lesion on the right labia. There is no rash, tenderness or lesion on the left labia. No erythema, tenderness or bleeding around the vagina. No signs of injury around the vagina. No vaginal discharge found.  Musculoskeletal: Normal range of motion. She exhibits no edema and no tenderness.  Lymphadenopathy:    She has no cervical adenopathy.  Neurological: She is alert and oriented to person, place, and time. She has normal reflexes. No cranial nerve deficit.  Skin: Skin is warm and dry. She is not diaphoretic.  Psychiatric: She has a normal mood and affect. Her behavior is normal.          Assessment & Plan:  Normal Pap smear obtained today.  Adjustment of insulin doses continue work with nutritional counseling

## 2012-05-01 ENCOUNTER — Other Ambulatory Visit: Payer: Self-pay | Admitting: *Deleted

## 2012-05-01 MED ORDER — FLUCONAZOLE 100 MG PO TABS: 100.0000 mg | ORAL_TABLET | Freq: Every day | ORAL | Status: AC

## 2012-05-13 ENCOUNTER — Encounter: Payer: Self-pay | Admitting: *Deleted

## 2012-05-13 ENCOUNTER — Encounter: Payer: BC Managed Care – PPO | Attending: Internal Medicine | Admitting: *Deleted

## 2012-05-13 VITALS — Ht 64.0 in | Wt 235.7 lb

## 2012-05-13 DIAGNOSIS — E119 Type 2 diabetes mellitus without complications: Secondary | ICD-10-CM | POA: Insufficient documentation

## 2012-05-13 DIAGNOSIS — Z713 Dietary counseling and surveillance: Secondary | ICD-10-CM | POA: Insufficient documentation

## 2012-05-13 NOTE — Patient Instructions (Addendum)
Goals:  Continue previous goals.   Increase exercise to >30 min daily.  Limit sodium to 2000 mg or less and increase fiber to 25-30 g daily.  Add protein to all snacks, especially fruit. Try [place] cheese (see snack sheet).   Take iron as directed by your physician; Take with Vit C with iron to increase absorption.

## 2012-05-13 NOTE — Progress Notes (Addendum)
Medical Nutrition Therapy:  Appt start time: 0800 end time:  0830.  Primary concerns today: T2DM w/ other specified manifestations, uncontrolled (250.82); Follow up.  Abigail Yoder returns today for f/u and reports decreased appetite, but tries to eat something d/t taking insulin. Continues high CHO meals and not adding protein with fruit. Overall, doing well and still working on dietary changes. No hypoglycemic episodes reported.   CBG Monitoring FBG: 68, 96-165 mg Pre-Prandial: 91, 115 mg Before bed: 117 mg *Reports from patient's VerioIQ meter sent to [facility] and will be available under media tab.   Lab Results  Component Value Date   HGBA1C 7.2* 04/17/2012    MEDICATIONS: MD has put pt on sliding scale with short-acting. Still not taking iron d/t constipation; states makes her feel "cold all the time".    DIETARY INTAKE:  Usual eating pattern includes 2-3 meals and 2-3 snacks per day.  24-hr recall:  B (7-7:15 AM): Coffee w/ truvia & creamer; 3 pc sausage, 2 Hawaiian rolls   Snk (10 AM): None OR fruit or cheese/crackers  L (1:30-2 PM): Taco salad, chips Snk (3 PM): Cheese and crackers D (PM):  Frosted Flakes  Snk (PM): Watermelon (Felt sugar was high afterwards, but did not check before bed) Beverages: Crystal light, sugar-free ICE drinks, coffee  Usual physical activity:  Reports swimming some and 1 Zumba class last week, but no consistent exercise noted.   Estimated energy needs: 1200-1400 calories 135-160 g carbohydrates 80-95 g protein 40-45 g fat  Progress Towards Goal(s):  In progress.   Nutritional Diagnosis:  Boomer-2.1 Impaired nutrient utilization related to glucose metabolism as evidenced by recent A1c of 12.1% and excessive intake of high CHO foods.    Intervention/Goals:  Continue previous goals.   Increase exercise to >30 min daily.  Limit sodium to 2000 mg or less and increase fiber to 25-30 g daily.  Add protein to all snacks, especially fruit.  Try Cabot cheese (see snack sheet).   Take iron as directed by your physician; Take with Vit C with iron to increase absorption.   Monitoring/Evaluation:  Dietary intake, exercise, A1c, BG trends, and body weight in 4-6 week(s).

## 2012-07-18 ENCOUNTER — Encounter: Payer: Self-pay | Admitting: Internal Medicine

## 2012-07-18 ENCOUNTER — Ambulatory Visit (INDEPENDENT_AMBULATORY_CARE_PROVIDER_SITE_OTHER): Payer: BC Managed Care – PPO | Admitting: Internal Medicine

## 2012-07-18 VITALS — BP 140/88 | HR 80 | Temp 98.4°F | Wt 242.0 lb

## 2012-07-18 DIAGNOSIS — E1169 Type 2 diabetes mellitus with other specified complication: Secondary | ICD-10-CM

## 2012-07-18 DIAGNOSIS — E785 Hyperlipidemia, unspecified: Secondary | ICD-10-CM

## 2012-07-18 DIAGNOSIS — E669 Obesity, unspecified: Secondary | ICD-10-CM

## 2012-07-18 LAB — BASIC METABOLIC PANEL
BUN: 12 mg/dL (ref 6–23)
Chloride: 106 mEq/L (ref 96–112)
Potassium: 4.5 mEq/L (ref 3.5–5.1)
Sodium: 138 mEq/L (ref 135–145)

## 2012-07-18 LAB — POCT URINALYSIS DIPSTICK
Bilirubin, UA: NEGATIVE
Ketones, UA: NEGATIVE
Leukocytes, UA: NEGATIVE

## 2012-07-18 LAB — HEMOGLOBIN A1C: Hgb A1c MFr Bld: 7.1 % — ABNORMAL HIGH (ref 4.6–6.5)

## 2012-07-18 LAB — LIPID PANEL
Cholesterol: 148 mg/dL (ref 0–200)
LDL Cholesterol: 69 mg/dL (ref 0–99)
Total CHOL/HDL Ratio: 3

## 2012-07-18 LAB — HEPATIC FUNCTION PANEL
ALT: 21 U/L (ref 0–35)
AST: 20 U/L (ref 0–37)
Total Bilirubin: 0.2 mg/dL — ABNORMAL LOW (ref 0.3–1.2)

## 2012-07-18 NOTE — Patient Instructions (Signed)
Consider a gluten-free diet such as a practical paleo  Breakfast eggs with onions and peppers and a slice of fruit a small apple a papaya or a banana  Lunch vegetable combination chicken breast fish or hamburger steak  Dinner vegetable dense dark carbohydrate like a sweet potato turnup yams or a butternut squash, a green vegetable and meat

## 2012-07-18 NOTE — Progress Notes (Signed)
Subjective:    Patient ID: Abigail Yoder, female    DOB: Jan 30, 1966, 46 y.o.   MRN: [id number]  HPI  OBESE diabetic with brittle diabetes Weight gain Lack of good diet understanding Recent diet review shows that she does not follow a good diet. She does not exercise  Review of Systems  Constitutional: Negative for activity change, appetite change and fatigue.  HENT: Negative for ear pain, congestion, neck pain, postnasal drip and sinus pressure.   Eyes: Negative for redness and visual disturbance.  Respiratory: Negative for cough, shortness of breath and wheezing.   Gastrointestinal: Negative for abdominal pain and abdominal distention.  Genitourinary: Negative for dysuria, frequency and menstrual problem.  Musculoskeletal: Negative for myalgias, joint swelling and arthralgias.  Skin: Negative for rash and wound.  Neurological: Negative for dizziness, weakness and headaches.  Hematological: Negative for adenopathy. Does not bruise/bleed easily.  Psychiatric/Behavioral: Negative for disturbed wake/sleep cycle and decreased concentration.   Past Medical History  Diagnosis Date  . Anemia   . Diabetes mellitus   . Headache   . Hypertension   . Obesity     History   Social History  . Marital Status: Divorced    Spouse Name: N/A    Number of Children: N/A  . Years of Education: N/A   Occupational History  . Not on file.   Social History Main Topics  . Smoking status: Former Smoker    Quit date: 11/19/1994  . Smokeless tobacco: Not on file  . Alcohol Use: No  . Drug Use: Not on file  . Sexually Active: Yes   Other Topics Concern  . Not on file   Social History Narrative  . No narrative on file    Past Surgical History  Procedure Date  . Appendectomy   . Cesarean section   . Tubal ligation     Family History  Problem Relation Age of Onset  . Diabetes Mother   . Arthritis Mother   . Hypertension Father   . Heart disease Father   . Hyperlipidemia  Father     No Known Allergies  Current Outpatient Prescriptions on File Prior to Visit  Medication Sig Dispense Refill  . glucose blood (ONETOUCH VERIO) test strip Use as instructed  100 each  12  . insulin aspart protamine-insulin aspart (NOVOLOG MIX 70/30 FLEXPEN) (70-30) 100 UNIT/ML injection Inject 35 Units into the skin 2 (two) times daily. If CBG prior to shot is greater than 250 increased 5 units If less that 150 decrease 5 units for that dose only  10 mL  12  . insulin detemir (LEVEMIR FLEXPEN) 100 UNIT/ML injection Inject 25 Units into the skin at bedtime.  9 mL  3  . Iron Combinations (IRON COMPLEX) CAPS Take 1 capsule by mouth daily.      Marland Kitchen olmesartan-hydrochlorothiazide (BENICAR HCT) 40-25 MG per tablet Take 1 tablet by mouth daily.        . Saxagliptin-Metformin (KOMBIGLYZE XR) 03-999 MG TB24 Take 1 tablet by mouth daily.          BP 140/88  Pulse 80  Temp 98.4 F (36.9 C) (Oral)  Wt 242 lb (109.77 kg)       Objective:   Physical Exam  Nursing note and vitals reviewed. Constitutional: She is oriented to person, place, and time. She appears well-developed and well-nourished. No distress.  HENT:  Head: Normocephalic and atraumatic.  Right Ear: External ear normal.  Left Ear: External ear normal.  Nose: Nose normal.  Mouth/Throat: Oropharynx is clear and moist.  Eyes: Conjunctivae and EOM are normal. Pupils are equal, round, and reactive to light.  Neck: Normal range of motion. Neck supple. No JVD present. No tracheal deviation present. No thyromegaly present.  Cardiovascular: Normal rate, regular rhythm, normal heart sounds and intact distal pulses.   No murmur heard. Pulmonary/Chest: Effort normal and breath sounds normal. She has no wheezes. She exhibits no tenderness.  Abdominal: Soft. Bowel sounds are normal.  Musculoskeletal: Normal range of motion. She exhibits no edema and no tenderness.  Lymphadenopathy:    She has no cervical adenopathy.  Neurological:  She is alert and oriented to person, place, and time. She has normal reflexes. No cranial nerve deficit.  Skin: Skin is warm and dry. She is not diaphoretic.  Psychiatric: She has a normal mood and affect. Her behavior is normal.          Assessment & Plan:  Poorly controlled diabetes reviewed diet and exercise suggested a gluten-free diet may be the best protocol to help her both control her blood sugar and weight.  We'll obtain hemoglobin 123456 a basic metabolic panel today CBC differential with a history of iron deficiency anemia diabetes poorly controlled and hypertension.  I'm concerned about her prolonged obesity creating other additional comorbid problems

## 2012-08-11 ENCOUNTER — Telehealth: Payer: Self-pay | Admitting: Internal Medicine

## 2012-08-11 MED ORDER — OLMESARTAN MEDOXOMIL-HCTZ 40-25 MG PO TABS: 1.0000 | ORAL_TABLET | Freq: Every day | ORAL | Status: DC

## 2012-08-11 MED ORDER — SAXAGLIPTIN-METFORMIN ER 5-1000 MG PO TB24: 1.0000 | ORAL_TABLET | Freq: Every day | ORAL | Status: DC

## 2012-08-11 NOTE — Telephone Encounter (Signed)
Caller: Abigail Yoder/Patient; Phone: [phone]; Reason for Call: Patient calling to get refills on two medications.  One of the medications she is not sure on how it is spelled and the other one is Benicar.  Please call her back.  Thanks

## 2012-08-11 NOTE — Telephone Encounter (Signed)
Called pt and pt states the other medication is kombiglyze.  Both medications sent to [facility] on [address].

## 2012-08-13 ENCOUNTER — Ambulatory Visit: Payer: BC Managed Care – PPO | Admitting: *Deleted

## 2012-10-09 ENCOUNTER — Encounter: Payer: Self-pay | Admitting: Family Medicine

## 2012-10-09 ENCOUNTER — Ambulatory Visit (INDEPENDENT_AMBULATORY_CARE_PROVIDER_SITE_OTHER): Payer: BC Managed Care – PPO | Admitting: Family Medicine

## 2012-10-09 ENCOUNTER — Telehealth: Payer: Self-pay | Admitting: Internal Medicine

## 2012-10-09 VITALS — BP 150/80 | HR 110 | Temp 98.4°F | Wt 239.0 lb

## 2012-10-09 DIAGNOSIS — IMO0001 Reserved for inherently not codable concepts without codable children: Secondary | ICD-10-CM

## 2012-10-09 DIAGNOSIS — E119 Type 2 diabetes mellitus without complications: Secondary | ICD-10-CM

## 2012-10-09 DIAGNOSIS — M791 Myalgia, unspecified site: Secondary | ICD-10-CM

## 2012-10-09 DIAGNOSIS — I10 Essential (primary) hypertension: Secondary | ICD-10-CM

## 2012-10-09 MED ORDER — TRAMADOL HCL 50 MG PO TABS: 25.0000 mg | ORAL_TABLET | Freq: Three times a day (TID) | ORAL | Status: DC | PRN

## 2012-10-09 MED ORDER — CYCLOBENZAPRINE HCL 5 MG PO TABS: 5.0000 mg | ORAL_TABLET | Freq: Three times a day (TID) | ORAL | Status: DC | PRN

## 2012-10-09 NOTE — Progress Notes (Signed)
Chief Complaint  Patient presents with  . right shoulder pan    since Saturday     HPI:  Here for acute visit for shoulder pain and because out of insulin:  R shoulder pain: -started: started last Saturday - took "pain pills" she bought at [employer] and felt better, but then started again yesterday and pain medication again relieved pain -symptoms: R trapezius area pain and feels like radiates down Shoulder and arm some too -worse with: moving shoulder -better with: OTC pain medicaiont -hx of: denies hx of neck surgery, trauma, can't think of initiating event -but does carry a big bag on this shoulder and thinks this might have caused this -has tried: OTC pain medication, shower this morning -denies: fevers, malaise, weakness, chills, numbness, CP, SOB  Out of insulin: -per [facility] notified our office and told to make an appt, per PCP notes appear has had poorly controlled diabetes for some time - though last Hemoglobin A1c improved at 7.1 about 2.5 months ago -PCP had also recommended GF diet -taking her novalog per phone note, but out of lantus -BS at home: has not checked blood sugars -denies: fevers, chills, polyuria, polydipsia, foot infections    ROS: See pertinent positives and negatives per HPI.  Past Medical History  Diagnosis Date  . Anemia   . Diabetes mellitus   . Headache   . Hypertension   . Obesity     Family History  Problem Relation Age of Onset  . Diabetes Mother   . Arthritis Mother   . Hypertension Father   . Heart disease Father   . Hyperlipidemia Father     History   Social History  . Marital Status: Divorced    Spouse Name: N/A    Number of Children: N/A  . Years of Education: N/A   Social History Main Topics  . Smoking status: Former Smoker    Quit date: 11/19/1994  . Smokeless tobacco: None  . Alcohol Use: No  . Drug Use: None  . Sexually Active: Yes   Other Topics Concern  . None   Social History Narrative  . None    Current  outpatient prescriptions:glucose blood (ONETOUCH VERIO) test strip, Use as instructed, Disp: 100 each, Rfl: 12;  insulin aspart protamine-insulin aspart (NOVOLOG MIX 70/30 FLEXPEN) (70-30) 100 UNIT/ML injection, Inject 35 Units into the skin 2 (two) times daily. If CBG prior to shot is greater than 250 increased 5 units If less that 150 decrease 5 units for that dose only, Disp: 10 mL, Rfl: 12 Iron Combinations (IRON COMPLEX) CAPS, Take 1 capsule by mouth daily., Disp: , Rfl: ;  olmesartan-hydrochlorothiazide (BENICAR HCT) 40-25 MG per tablet, Take 1 tablet by mouth daily., Disp: 30 tablet, Rfl: 2;  Saxagliptin-Metformin (KOMBIGLYZE XR) 03-999 MG TB24, Take 1 tablet by mouth daily., Disp: 30 tablet, Rfl: 2 cyclobenzaprine (FLEXERIL) 5 MG tablet, Take 1 tablet (5 mg total) by mouth 3 (three) times daily as needed for muscle spasms., Disp: 15 tablet, Rfl: 0;  insulin detemir (LEVEMIR FLEXPEN) 100 UNIT/ML injection, Inject 25 Units into the skin at bedtime., Disp: 9 mL, Rfl: 3;  traMADol (ULTRAM) 50 MG tablet, Take 0.5 tablets (25 mg total) by mouth every 8 (eight) hours as needed for pain., Disp: 15 tablet, Rfl: 0  EXAM:  Filed Vitals:   10/09/12 1008  BP: 180/84  Pulse: 110  Temp: 98.4 F (36.9 C)    There is no height on file to calculate BMI.  GENERAL: vitals reviewed  and listed above, alert, oriented, appears well hydrated and in no acute distress  HEENT: atraumatic, conjunttiva clear, no obvious abnormalities on inspection of external nose and ears  NECK: no obvious masses on inspection, normal ROM of neck, neg spurling  LUNGS: clear to auscultation bilaterally, no wheezes, rales or rhonchi, good air movement  CV: HRRR, no peripheral edema  MS/NEURO: moves all extremities without noticeable abnormality - TTP trapezius muscle R with muscle tension. Normal muscle strength, sensation to light touch and DTRs UEs bilat.  PSYCH: pleasant and cooperative, no obvious depression or  anxiety  ASSESSMENT AND PLAN:  Discussed the following assessment and plan:  1. Muscle pain  cyclobenzaprine (FLEXERIL) 5 MG tablet, traMADol (ULTRAM) 50 MG tablet  2. Diabetes    3. HYPERTENSION      For shoulder pain: -trapezius muscle strain likely with possible radiculopathy mild though normal neuro exam -tx recs and follow up per above  For her Diabetes: -advised follow up with PCP for her diabetes and provided samples of lantus to help her in the meantime as patient reports she just can not afford the levemir -advised bid (fasting and random postprandial) blood sugar checks and log to bring to next appointment -advised on management of hypoglycemia -reemphasized recs for lifestyle changes that PCP has advised in the past -Patient advised to return or notify a doctor immediately if symptoms worsen or persist or new concerns arise.  HTN: -she thinks is due to pain -better on recheck -looks like has been ok at last several appts with PCP -advised close follow up with PCP to recheck   Patient Instructions  FOR SHOULDER:  - heat for 15 minutes twice daily -use muscle relaxer and or ultram for pain and spasm only as needed for severe pain -do exercises provided -follow up with your doctor at next available appointment or sooner if worsening   FOR DIABETES: -PLEASE check your blood sugar twice daily at a minimum: 1) FASTING (before eatin gin the morning) 2) POST POSTPRANDIAL two hours after any meal 3) any time you feel sick or dizzy to make sure not low KEEP A LOG TO BRING TO APPOINTMENT WITH DR. Arnoldo Morale  Start lantus in place of levemir since you can't afford levemir: -start 15 units daily then increase every 3 days by 3 units if blood sugar running high FASTING > 120 or POSTPRANDIAL > 170  IF any low blood sugars (< 70) eat a snack or use tablets or drink and small amount of juice and call your doctor      Colin Benton R.

## 2012-10-09 NOTE — Patient Instructions (Addendum)
FOR SHOULDER:  - heat for 15 minutes twice daily -use muscle relaxer and or ultram for pain and spasm only as needed for severe pain -do exercises provided -follow up with your doctor at next available appointment or sooner if worsening   FOR DIABETES: -PLEASE check your blood sugar twice daily at a minimum: 1) FASTING (before eatin gin the morning) 2) POST POSTPRANDIAL two hours after any meal 3) any time you feel sick or dizzy to make sure not low KEEP A LOG TO BRING TO APPOINTMENT WITH DR. Arnoldo Morale  Start lantus in place of levemir since you can't afford levemir: -start 15 units daily then increase every 3 days by 3 units if blood sugar running high FASTING > 120 or POSTPRANDIAL > 170  IF any low blood sugars (< 70) eat a snack or use tablets or drink and small amount of juice and call your doctor

## 2012-10-09 NOTE — Telephone Encounter (Signed)
Patient Information:  Caller Name: Abigail Yoder  Phone: [phone]  Patient: Abigail Yoder, Abigail Yoder  Gender: Female  DOB: 08/17/1966  Age: 46 Years  PCP: Benay Pillow (Adults only)  Pregnant: No   Symptoms  Reason For Call & Symptoms: Patient states she has pain in right shoulder and down into right arm into small finger.  Onset last Saturday 10/04/12. No inury noted. No chest pain, no shortness of breath.  She feels like a knot in back that you can actually see. OTC pain reliver did help. Pain is constant this morning.   She is Diabetic and does not check her blood sugar.  She does take Insulin "when I have it". She is taking Novalog but has not had her Levimir- x1 month.. Voice is clear and purposeful. She does not want to go to the ER for any care  Reviewed Health History In EMR: Yes  Reviewed Medications In EMR: Yes  Reviewed Allergies In EMR: Yes  Date of Onset of Symptoms: 10/04/2012  Treatments Tried: Last night treated x2 pain pills- OTC  "pain reliever"  Treatments Tried Worked: Yes OB:  LMP: 09/08/2012  Guideline(s) Used:  Shoulder Pain  Disposition Per Guideline:   Go to ED Now  Reason For Disposition Reached:   Age > 48 and no obvious cause and pain even when not moving the arm (Exception: pain is clearly made worse by moving arm or bending neck)  Advice Given:  N/A  Office Follow Up:  Does the office need to follow up with this patient?: Yes  Instructions For The Office: Patient declines to go to the  ER per protocol. She is having Financial issues and request to be seen in the office  Patient Refused Recommendation:  Patient Refused Care Advice  Patient declines ER and wants to be seen in the office  RN Note:  Patient states she is having financial issues when discussed with her the importance of medication, blood sugar checks. She states office is aware. Medications are filled at [facility]. She does have [name]. Contacted [name] in the office. Appt scheduled with Dr. Maudie Mercury  today, 10/09/12 at 10;15 for evaluation.  Patient expresed understanding. Home care advise and call back guidelines reviewed.

## 2012-10-17 ENCOUNTER — Ambulatory Visit: Payer: BC Managed Care – PPO | Admitting: Internal Medicine

## 2012-10-20 ENCOUNTER — Encounter: Payer: Self-pay | Admitting: Family Medicine

## 2012-10-20 ENCOUNTER — Ambulatory Visit (INDEPENDENT_AMBULATORY_CARE_PROVIDER_SITE_OTHER): Payer: BC Managed Care – PPO | Admitting: Family Medicine

## 2012-10-20 VITALS — BP 150/78 | HR 121 | Temp 98.9°F | Wt 235.0 lb

## 2012-10-20 DIAGNOSIS — M79601 Pain in right arm: Secondary | ICD-10-CM

## 2012-10-20 DIAGNOSIS — M79609 Pain in unspecified limb: Secondary | ICD-10-CM

## 2012-10-20 NOTE — Patient Instructions (Signed)
Cervical Radiculopathy Cervical radiculopathy means a nerve in the neck is pinched or bruised. This can cause pain or loss of feeling (numbness) that runs from your neck to your arm and fingers. HOME CARE   Put ice on the injured or painful area.  Put ice in a plastic bag.  Place a towel between your skin and the bag.  Leave the ice on for 15 to 20 minutes, 3 to 4 times a day, or as told by your doctor.  If ice does not help, you can try using heat. Take a warm shower or bath, or use a hot water bottle as told by your doctor.  You may try a gentle neck and shoulder massage.  Use a flat pillow when you sleep.  Only take medicines as told by your doctor.  Keep all physical therapy visits as told by your doctor.  If you are given a soft collar, wear it as told by your doctor. GET HELP RIGHT AWAY IF:   Your pain gets worse and is not controlled with medicine.  You lose feeling or feel weak in your hand, arm, face, or leg.  You have a fever or stiff neck.  You cannot control when you poop or pee (incontinence).  You have trouble with walking, balance, or speaking. MAKE SURE YOU:   Understand these instructions.  Will watch your condition.  Will get help right away if you are not doing well or get worse. Document Released: 10/25/2011 Document Revised: 01/28/2012 Document Reviewed: 10/25/2011 [facility] Patient Information 2013 [place].

## 2012-10-20 NOTE — Progress Notes (Signed)
Chief Complaint  Patient presents with  . 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  Past Medical History  Diagnosis Date  . Anemia   . Diabetes mellitus   . Headache   . Hypertension   . Obesity     Family History  Problem Relation Age of Onset  . Diabetes Mother   . Arthritis Mother   . Hypertension Father   . Heart disease Father   . Hyperlipidemia Father     History   Social History  . Marital Status: Divorced    Spouse Name: N/A    Number of Children: N/A  . Years of Education: N/A   Social History Main Topics  . Smoking status: Former Smoker    Quit date: 11/19/1994  . Smokeless tobacco: None  . Alcohol Use: No  . Drug Use: None  . Sexually Active: Yes   Other Topics Concern  . None   Social History Narrative  . None    Current outpatient prescriptions:cyclobenzaprine (FLEXERIL) 5 MG tablet, Take 1 tablet (5 mg total) by mouth 3 (three) times daily as needed for muscle spasms., Disp: 15 tablet, Rfl: 0;  glucose blood (ONETOUCH VERIO) test strip, Use as instructed, Disp: 100 each, Rfl: 12 insulin aspart protamine-insulin aspart (NOVOLOG MIX 70/30 FLEXPEN) (70-30) 100 UNIT/ML injection, Inject 35 Units into the skin 2 (two) times daily. If CBG prior to shot is greater than 250 increased 5 units If less that 150 decrease 5 units for that dose only, Disp: 10 mL, Rfl: 12;  Iron Combinations (IRON COMPLEX) CAPS, Take 1  capsule by mouth daily., Disp: , Rfl:  olmesartan-hydrochlorothiazide (BENICAR HCT) 40-25 MG per tablet, Take 1 tablet by mouth daily., Disp: 30 tablet, Rfl: 2;  Saxagliptin-Metformin (KOMBIGLYZE XR) 03-999 MG TB24, Take 1 tablet by mouth daily., Disp: 30 tablet, Rfl: 2;  traMADol (ULTRAM) 50 MG tablet, Take 0.5 tablets (25 mg total) by mouth every 8 (eight) hours as needed for pain., Disp: 15 tablet, Rfl: 0 insulin detemir (LEVEMIR FLEXPEN) 100 UNIT/ML injection, Inject 25 Units into the skin at bedtime., Disp: 9 mL, Rfl: 3  EXAM:  Filed Vitals:   10/20/12 1516  BP: 150/78  Pulse: 121  Temp: 98.9 F (37.2 C)    There is no height on file to calculate BMI.  GENERAL: vitals reviewed and listed above, alert, oriented, appears well hydrated and in no acute distress  HEENT: atraumatic, conjunttiva clear, no obvious abnormalities on inspection of external nose and ears  NECK: no obvious masses on inspection, normal ROM  MS: moves all extremities without noticeable abnormality - normal ROM in neck and arms bilat, normal muscle strength and sensation throughout upper extremities bilat, normal DTRs, some TTP R lateral upper arm, no bony TTP, + spurling, neg shoulder abduction relief test, neg lhermitte's  PSYCH: pleasant and cooperative, no obvious depression or anxiety  ASSESSMENT AND PLAN:  Discussed the following assessment and plan:  1. Arm  pain, right  Ambulatory referral to Physical Therapy   -likely radiculopathy -discussed that with no weakness or loss of sensation - conservative management for 6-8 weeks is reasonable. Discussed etiologies, imaging, workup and tx options. Pt decided on physical therapy and topical therapies. She will call me if not improving significantly in 4 weeks or if worsening. She has follow up with her PCP. -Patient advised to return or notify a doctor immediately if symptoms worsen or persist or new concerns arise.  Patient Instructions  Cervical  Radiculopathy Cervical radiculopathy means a nerve in the neck is pinched or bruised. This can cause pain or loss of feeling (numbness) that runs from your neck to your arm and fingers. HOME CARE   Put ice on the injured or painful area.  Put ice in a plastic bag.  Place a towel between your skin and the bag.  Leave the ice on for 15 to 20 minutes, 3 to 4 times a day, or as told by your doctor.  If ice does not help, you can try using heat. Take a warm shower or bath, or use a hot water bottle as told by your doctor.  You may try a gentle neck and shoulder massage.  Use a flat pillow when you sleep.  Only take medicines as told by your doctor.  Keep all physical therapy visits as told by your doctor.  If you are given a soft collar, wear it as told by your doctor. GET HELP RIGHT AWAY IF:   Your pain gets worse and is not controlled with medicine.  You lose feeling or feel weak in your hand, arm, face, or leg.  You have a fever or stiff neck.  You cannot control when you poop or pee (incontinence).  You have trouble with walking, balance, or speaking. MAKE SURE YOU:   Understand these instructions.  Will watch your condition.  Will get help right away if you are not doing well or get worse. Document Released: 10/25/2011 Document Revised: 01/28/2012 Document Reviewed: 10/25/2011 [facility] Patient Information 2013 [place], [place], [place]

## 2012-10-29 ENCOUNTER — Ambulatory Visit: Payer: BC Managed Care – PPO

## 2012-11-05 ENCOUNTER — Telehealth: Payer: Self-pay | Admitting: Internal Medicine

## 2012-11-05 NOTE — Telephone Encounter (Signed)
Pt will pick up this pm

## 2012-11-05 NOTE — Telephone Encounter (Signed)
Patient called stating that she would like to have samples of novolog mix 70/30 flexpen,levemir flex pen,komblygize xr 5-1000mg  tb24. Please assist.

## 2012-11-28 ENCOUNTER — Other Ambulatory Visit: Payer: Self-pay | Admitting: *Deleted

## 2012-11-28 MED ORDER — OLMESARTAN MEDOXOMIL-HCTZ 40-25 MG PO TABS: 1.0000 | ORAL_TABLET | Freq: Every day | ORAL | Status: DC

## 2012-12-15 ENCOUNTER — Encounter: Payer: Self-pay | Admitting: Internal Medicine

## 2012-12-15 ENCOUNTER — Ambulatory Visit (INDEPENDENT_AMBULATORY_CARE_PROVIDER_SITE_OTHER): Payer: BC Managed Care – PPO | Admitting: Internal Medicine

## 2012-12-15 VITALS — BP 170/94 | HR 88 | Temp 98.2°F | Resp 16 | Ht 64.0 in | Wt 238.0 lb

## 2012-12-15 DIAGNOSIS — D509 Iron deficiency anemia, unspecified: Secondary | ICD-10-CM

## 2012-12-15 DIAGNOSIS — E1169 Type 2 diabetes mellitus with other specified complication: Secondary | ICD-10-CM

## 2012-12-15 DIAGNOSIS — M5000 Cervical disc disorder with myelopathy, unspecified cervical region: Secondary | ICD-10-CM

## 2012-12-15 DIAGNOSIS — E1165 Type 2 diabetes mellitus with hyperglycemia: Secondary | ICD-10-CM

## 2012-12-15 LAB — CBC WITH DIFFERENTIAL/PLATELET
Basophils Relative: 0.4 % (ref 0.0–3.0)
Eosinophils Absolute: 0.1 10*3/uL (ref 0.0–0.7)
Hemoglobin: 10.4 g/dL — ABNORMAL LOW (ref 12.0–15.0)
Lymphs Abs: 2.3 10*3/uL (ref 0.7–4.0)
MCHC: 32.8 g/dL (ref 30.0–36.0)
MCV: 91.4 fl (ref 78.0–100.0)
Monocytes Absolute: 0.5 10*3/uL (ref 0.1–1.0)
Neutro Abs: 6.6 10*3/uL (ref 1.4–7.7)
RBC: 3.48 Mil/uL — ABNORMAL LOW (ref 3.87–5.11)
RDW: 14.2 % (ref 11.5–14.6)

## 2012-12-15 NOTE — Progress Notes (Signed)
  Subjective:    Patient ID: Abigail Yoder, female    DOB: 12/12/1965, 48 y.o.   MRN: [id number]  HPI Patient is 47 year old African American female with adult-onset diabetes now insulin requiring hypertension and now with newly diagnosed cervical disc disorder at C7.  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  She's been evaluated by orthopedic surgery who has her scheduled for a consultation to set a date for cervical disc surgery of unknown type as I do not have a copy of their note or plan.  The MRI was done at the orthopedics group and I do not have a copy of the MRI   Review of Systems     Objective:   Physical Exam        Assessment & Plan:  Will obtain the MRI last the patient to obtain the MRI and take with her to a neurosurgical consultation before surgery ensues.  Diabetic control per her A1c's is moderate with the A1c probably been about 7-7-1/2 reflected by an average blood glucose of about 150 on her glucometer Obtain an A1c today since surgery is planned we will also look at a CBC differential and iron level since she has a history of iron deficiency anemia

## 2013-03-03 ENCOUNTER — Encounter: Payer: Self-pay | Admitting: Internal Medicine

## 2013-03-05 ENCOUNTER — Other Ambulatory Visit: Payer: Self-pay | Admitting: Internal Medicine

## 2013-03-12 ENCOUNTER — Encounter: Payer: Self-pay | Admitting: Internal Medicine

## 2013-03-16 ENCOUNTER — Ambulatory Visit (INDEPENDENT_AMBULATORY_CARE_PROVIDER_SITE_OTHER): Payer: BC Managed Care – PPO | Admitting: Internal Medicine

## 2013-03-16 ENCOUNTER — Encounter: Payer: Self-pay | Admitting: Internal Medicine

## 2013-03-16 VITALS — BP 140/70 | HR 88 | Temp 98.2°F | Resp 16 | Ht 64.0 in | Wt 234.0 lb

## 2013-03-16 DIAGNOSIS — E1165 Type 2 diabetes mellitus with hyperglycemia: Secondary | ICD-10-CM

## 2013-03-16 DIAGNOSIS — E785 Hyperlipidemia, unspecified: Secondary | ICD-10-CM

## 2013-03-16 DIAGNOSIS — I10 Essential (primary) hypertension: Secondary | ICD-10-CM

## 2013-03-16 LAB — LIPID PANEL
Cholesterol: 137 mg/dL (ref 0–200)
HDL: 50.8 mg/dL (ref >39.00)
LDL Cholesterol: 74 mg/dL (ref 0–99)
Triglycerides: 60 mg/dL (ref 0.0–149.0)

## 2013-03-16 LAB — BASIC METABOLIC PANEL
Chloride: 105 mEq/L (ref 96–112)
Creatinine, Ser: 0.9 mg/dL (ref 0.4–1.2)
Potassium: 3.8 mEq/L (ref 3.5–5.1)
Sodium: 139 mEq/L (ref 135–145)

## 2013-03-16 LAB — HEMOGLOBIN A1C: Hgb A1c MFr Bld: 6.1 % (ref 4.6–6.5)

## 2013-03-16 MED ORDER — INSULIN GLARGINE 100 UNIT/ML ~~LOC~~ SOLN: 25.0000 [IU] | Freq: Every day | SUBCUTANEOUS | Status: DC

## 2013-03-16 NOTE — Patient Instructions (Addendum)
The patient is instructed to continue all medications as prescribed. Schedule followup with check out clerk upon leaving the clinic  

## 2013-03-16 NOTE — Progress Notes (Signed)
Subjective:    Patient ID: Abigail Yoder, female    DOB: 1966-02-05, 47 y.o.   MRN: [id number]  HPI Stable blood glucoses per the paitent but she did not bring her  Glucometer Neck pain has improved Has been out of work post neck surgery Needs PT but she has financial barriers Pain has improved  Weight management and Insulin requiring DM discucssed   Review of Systems  Constitutional: Negative for activity change, appetite change and fatigue.  HENT: Negative for ear pain, congestion, neck pain, postnasal drip and sinus pressure.   Eyes: Negative for redness and visual disturbance.  Respiratory: Negative for cough, shortness of breath and wheezing.   Gastrointestinal: Negative for abdominal pain and abdominal distention.  Genitourinary: Positive for frequency. Negative for dysuria and menstrual problem.  Musculoskeletal: Positive for myalgias, joint swelling and arthralgias.  Skin: Negative for rash and wound.  Neurological: Negative for dizziness, weakness and headaches.  Hematological: Negative for adenopathy. Does not bruise/bleed easily.  Psychiatric/Behavioral: Negative for sleep disturbance and decreased concentration.   Past Medical History  Diagnosis Date  . Anemia   . Diabetes mellitus   . Headache   . Hypertension   . Obesity     History   Social History  . Marital Status: Divorced    Spouse Name: N/A    Number of Children: N/A  . Years of Education: N/A   Occupational History  . Not on file.   Social History Main Topics  . Smoking status: Former Smoker    Quit date: 11/19/1994  . Smokeless tobacco: Not on file  . Alcohol Use: No  . Drug Use: Not on file  . Sexually Active: Yes   Other Topics Concern  . Not on file   Social History Narrative  . No narrative on file    Past Surgical History  Procedure Laterality Date  . Appendectomy    . Cesarean section    . Tubal ligation      Family History  Problem Relation Age of Onset  .  Diabetes Mother   . Arthritis Mother   . Hypertension Father   . Heart disease Father   . Hyperlipidemia Father     No Known Allergies  Current Outpatient Prescriptions on File Prior to Visit  Medication Sig Dispense Refill  . insulin detemir (LEVEMIR FLEXPEN) 100 UNIT/ML injection Inject 25 Units into the skin at bedtime.  9 mL  3  . Iron Combinations (IRON COMPLEX) CAPS Take 1 capsule by mouth daily.      Marland Kitchen NOVOLOG MIX 70/30 FLEXPEN (70-30) 100 UNIT/ML injection INJECT 40 UNITS SUBCUTANEOUSLY TWICE DAILY  24 mL  0  . olmesartan-hydrochlorothiazide (BENICAR HCT) 40-25 MG per tablet Take 1 tablet by mouth daily.  30 tablet  11  . Saxagliptin-Metformin (KOMBIGLYZE XR) 03-999 MG TB24 Take 1 tablet by mouth daily.  30 tablet  2   No current facility-administered medications on file prior to visit.    BP 140/70  Pulse 88  Temp(Src) 98.2 F (36.8 C)  Resp 16  Ht 5\' 4"  (1.626 m)  Wt 234 lb (106.142 kg)  BMI 40.15 kg/m2        Objective:   Physical Exam  Nursing note and vitals reviewed. Constitutional: She is oriented to person, place, and time. She appears well-developed and well-nourished. No distress.  HENT:  Head: Normocephalic and atraumatic.  Right Ear: External ear normal.  Left Ear: External ear normal.  Nose: Nose normal.  Mouth/Throat: Oropharynx is  clear and moist.  Eyes: Conjunctivae and EOM are normal. Pupils are equal, round, and reactive to light.  Neck: Normal range of motion. Neck supple. No JVD present. No tracheal deviation present. No thyromegaly present.  Cardiovascular: Normal rate, regular rhythm and intact distal pulses.   Murmur heard. Pulmonary/Chest: Effort normal and breath sounds normal. She has no wheezes. She exhibits no tenderness.  Musculoskeletal: She exhibits tenderness. She exhibits no edema.  Lymphadenopathy:    She has no cervical adenopathy.  Neurological: She is alert and oriented to person, place, and time. She has normal reflexes.  No cranial nerve deficit.  Skin: Skin is warm and dry. She is not diaphoretic.  Psychiatric: She has a normal mood and affect. Her behavior is normal.          Assessment & Plan:  Diet control has been easier at home Has been limiting carbohydrates She feels that ehr CBG's are improved

## 2013-04-28 ENCOUNTER — Other Ambulatory Visit: Payer: Self-pay | Admitting: Internal Medicine

## 2013-05-08 ENCOUNTER — Other Ambulatory Visit: Payer: Self-pay | Admitting: *Deleted

## 2013-05-08 MED ORDER — INSULIN ASPART PROT & ASPART (70-30 MIX) 100 UNIT/ML PEN: 40.0000 [IU] | PEN_INJECTOR | Freq: Two times a day (BID) | SUBCUTANEOUS | Status: DC

## 2013-07-10 ENCOUNTER — Ambulatory Visit: Payer: BC Managed Care – PPO | Admitting: Internal Medicine

## 2013-07-15 ENCOUNTER — Ambulatory Visit: Payer: BC Managed Care – PPO | Admitting: Internal Medicine

## 2013-08-12 ENCOUNTER — Ambulatory Visit: Payer: BC Managed Care – PPO | Admitting: Internal Medicine

## 2013-11-23 ENCOUNTER — Ambulatory Visit (INDEPENDENT_AMBULATORY_CARE_PROVIDER_SITE_OTHER): Payer: BC Managed Care – PPO | Admitting: Internal Medicine

## 2013-11-23 ENCOUNTER — Other Ambulatory Visit: Payer: Self-pay | Admitting: *Deleted

## 2013-11-23 ENCOUNTER — Telehealth: Payer: Self-pay | Admitting: Internal Medicine

## 2013-11-23 ENCOUNTER — Encounter: Payer: Self-pay | Admitting: Internal Medicine

## 2013-11-23 VITALS — BP 150/80 | HR 88 | Temp 98.2°F | Resp 16 | Ht 64.0 in | Wt 231.0 lb

## 2013-11-23 DIAGNOSIS — IMO0002 Reserved for concepts with insufficient information to code with codable children: Secondary | ICD-10-CM

## 2013-11-23 DIAGNOSIS — E669 Obesity, unspecified: Secondary | ICD-10-CM

## 2013-11-23 DIAGNOSIS — I10 Essential (primary) hypertension: Secondary | ICD-10-CM

## 2013-11-23 DIAGNOSIS — E1165 Type 2 diabetes mellitus with hyperglycemia: Secondary | ICD-10-CM

## 2013-11-23 DIAGNOSIS — E1169 Type 2 diabetes mellitus with other specified complication: Secondary | ICD-10-CM

## 2013-11-23 MED ORDER — AZITHROMYCIN 250 MG PO TABS: ORAL_TABLET | ORAL | Status: DC

## 2013-11-23 MED ORDER — PSEUDOEPHED HCL-CPM-DM HBR TAN 30-4-30 MG/5ML PO SUSP: 5.0000 mL | Freq: Two times a day (BID) | ORAL | Status: DC

## 2013-11-23 NOTE — Telephone Encounter (Signed)
Will use an otc c omparible to that

## 2013-11-23 NOTE — Progress Notes (Signed)
   Subjective:    Patient ID: Abigail Yoder, female    DOB: [birth year], 48 y.o.   MRN: [id number]  HPI Flu like illness and has been sick for one week Feels better with only cough and runny nose Needs cough medication CBG's have been 7 day 106, 14 day 115 and 90 day 119! Good!     Review of Systems  Constitutional: Positive for activity change, appetite change and fatigue.  HENT: Positive for postnasal drip and rhinorrhea. Negative for congestion, ear pain and sinus pressure.   Eyes: Negative for redness and visual disturbance.  Respiratory: Positive for cough. Negative for shortness of breath and wheezing.   Gastrointestinal: Negative for abdominal pain and abdominal distention.  Genitourinary: Negative for dysuria, frequency and menstrual problem.  Musculoskeletal: Negative for arthralgias, joint swelling, myalgias and neck pain.  Skin: Negative for rash and wound.  Neurological: Negative for dizziness, weakness and headaches.  Hematological: Negative for adenopathy. Does not bruise/bleed easily.  Psychiatric/Behavioral: Negative for sleep disturbance and decreased concentration.       Objective:   Physical Exam  Constitutional: She is oriented to person, place, and time. She appears well-developed and well-nourished. No distress.  HENT:  Head: Normocephalic and atraumatic.  Eyes: Conjunctivae and EOM are normal. Pupils are equal, round, and reactive to light.  Neck: Normal range of motion. Neck supple. No JVD present. No tracheal deviation present. No thyromegaly present.  Cardiovascular: Normal rate and regular rhythm.   No murmur heard. Pulmonary/Chest: Effort normal and breath sounds normal. She has no wheezes. She exhibits no tenderness.  Abdominal: Soft. Bowel sounds are normal.  Musculoskeletal: Normal range of motion. She exhibits no edema and no tenderness.  Lymphadenopathy:    She has no cervical adenopathy.  Neurological: She is alert and oriented to  person, place, and time. She has normal reflexes. No cranial nerve deficit.  Skin: Skin is warm and dry. She is not diaphoretic.  Psychiatric: She has a normal mood and affect. Her behavior is normal.          Assessment & Plan:  URI possible bacterial bronchitis Zpack and Atuss Good CBG's and better DM control HTN stable

## 2013-11-23 NOTE — Telephone Encounter (Signed)
Patient called stating the cough syrup that was called in her insurance does not and its 1,000 Please advise if anything else can be called in

## 2013-12-31 ENCOUNTER — Other Ambulatory Visit: Payer: Self-pay | Admitting: Internal Medicine

## 2014-03-11 ENCOUNTER — Encounter: Payer: Self-pay | Admitting: Internal Medicine

## 2014-04-02 ENCOUNTER — Other Ambulatory Visit: Payer: Self-pay | Admitting: Internal Medicine

## 2014-04-14 ENCOUNTER — Telehealth: Payer: Self-pay | Admitting: Internal Medicine

## 2014-04-14 MED ORDER — INSULIN GLARGINE 100 UNIT/ML SOLOSTAR PEN: PEN_INJECTOR | SUBCUTANEOUS | Status: DC

## 2014-04-14 NOTE — Telephone Encounter (Signed)
Pt states [facility] @ [place] says they do not have LANTUS SOLOSTAR 100 UNIT/ML Solostar Pen Sent 5/15. Can you resend please?

## 2014-04-14 NOTE — Telephone Encounter (Signed)
rx sent in electronically 

## 2014-05-20 ENCOUNTER — Other Ambulatory Visit: Payer: Self-pay | Admitting: Internal Medicine

## 2014-05-26 ENCOUNTER — Ambulatory Visit: Payer: BC Managed Care – PPO | Admitting: Internal Medicine

## 2014-05-26 ENCOUNTER — Ambulatory Visit (INDEPENDENT_AMBULATORY_CARE_PROVIDER_SITE_OTHER): Payer: BC Managed Care – PPO | Admitting: Physician Assistant

## 2014-05-26 ENCOUNTER — Encounter: Payer: Self-pay | Admitting: Physician Assistant

## 2014-05-26 VITALS — BP 126/76 | HR 78 | Temp 98.0°F | Resp 18 | Wt 237.0 lb

## 2014-05-26 DIAGNOSIS — E1165 Type 2 diabetes mellitus with hyperglycemia: Secondary | ICD-10-CM

## 2014-05-26 DIAGNOSIS — Z0000 Encounter for general adult medical examination without abnormal findings: Secondary | ICD-10-CM

## 2014-05-26 DIAGNOSIS — E1169 Type 2 diabetes mellitus with other specified complication: Secondary | ICD-10-CM

## 2014-05-26 DIAGNOSIS — I10 Essential (primary) hypertension: Secondary | ICD-10-CM

## 2014-05-26 DIAGNOSIS — IMO0002 Reserved for concepts with insufficient information to code with codable children: Secondary | ICD-10-CM

## 2014-05-26 LAB — CBC WITH DIFFERENTIAL/PLATELET
Basophils Absolute: 0 10*3/uL (ref 0.0–0.1)
Basophils Relative: 0.2 % (ref 0.0–3.0)
EOS ABS: 0.1 10*3/uL (ref 0.0–0.7)
Eosinophils Relative: 1.2 % (ref 0.0–5.0)
HCT: 32.8 % — ABNORMAL LOW (ref 36.0–46.0)
Hemoglobin: 10.7 g/dL — ABNORMAL LOW (ref 12.0–15.0)
LYMPHS PCT: 25.4 % (ref 12.0–46.0)
Lymphs Abs: 1.7 10*3/uL (ref 0.7–4.0)
MCHC: 32.5 g/dL (ref 30.0–36.0)
MCV: 89.9 fl (ref 78.0–100.0)
Monocytes Absolute: 0.5 10*3/uL (ref 0.1–1.0)
Monocytes Relative: 8.2 % (ref 3.0–12.0)
NEUTROS ABS: 4.3 10*3/uL (ref 1.4–7.7)
NEUTROS PCT: 65 % (ref 43.0–77.0)
PLATELETS: 264 10*3/uL (ref 150.0–400.0)
RBC: 3.65 Mil/uL — ABNORMAL LOW (ref 3.87–5.11)
RDW: 14.8 % (ref 11.5–15.5)
WBC: 6.6 10*3/uL (ref 4.0–10.5)

## 2014-05-26 LAB — BASIC METABOLIC PANEL
BUN: 20 mg/dL (ref 6–23)
CO2: 28 mEq/L (ref 19–32)
CREATININE: 1.2 mg/dL (ref 0.4–1.2)
Calcium: 9.4 mg/dL (ref 8.4–10.5)
Chloride: 105 mEq/L (ref 96–112)
GFR: 64.05 mL/min (ref >60.00)
Glucose, Bld: 112 mg/dL — ABNORMAL HIGH (ref 70–99)
POTASSIUM: 4.4 meq/L (ref 3.5–5.1)
Sodium: 138 mEq/L (ref 135–145)

## 2014-05-26 LAB — HEPATIC FUNCTION PANEL
ALBUMIN: 3.4 g/dL — AB (ref 3.5–5.2)
ALT: 13 U/L (ref 0–35)
AST: 16 U/L (ref 0–37)
Alkaline Phosphatase: 87 U/L (ref 39–117)
Bilirubin, Direct: 0.1 mg/dL (ref 0.0–0.3)
Total Bilirubin: 0.4 mg/dL (ref 0.2–1.2)
Total Protein: 7.8 g/dL (ref 6.0–8.3)

## 2014-05-26 LAB — HEMOGLOBIN A1C: HEMOGLOBIN A1C: 6 % (ref 4.6–6.5)

## 2014-05-26 LAB — LIPID PANEL
CHOLESTEROL: 148 mg/dL (ref 0–200)
HDL: 51.2 mg/dL (ref >39.00)
LDL Cholesterol: 80 mg/dL (ref 0–99)
NonHDL: 96.8
Total CHOL/HDL Ratio: 3
Triglycerides: 83 mg/dL (ref 0.0–149.0)
VLDL: 16.6 mg/dL (ref 0.0–40.0)

## 2014-05-26 NOTE — Progress Notes (Signed)
Subjective:    Patient ID: Abigail Yoder, female    DOB: Apr 12, 1966, 48 y.o.   MRN: [id number]  HPI Patient is a 48 y.o. female presenting for follow up of diabetes. Pt is currently taking novolog 70/30, she takes 35 units twice daily for glucose readings less than 200, 40 units if higher than 200, she takes lantus 25 units daily at bedtime, and kombiglyze XR 5-1000mg  once daily. She states that her sugars are running usually less than 200, however has had 2 cases the past month where it was higher. She states that she is tolerating her medications well, no adverse effects. She states that she is ready to change her lifestyle and lose weight to hopefully decrease her need for diabetes medications. Her last a1c was on 03/16/2013 and was 6.1%.   She also takes benicar daily for her HTN, which she states keeps her BPs under control. She states she is tolerating this well and denies any adverse effects.  She denies F/C/N/V/DSOB/CP/HA/Syncope.   Review of Systems As per HPI and are otherwise negative.    Past Medical History  Diagnosis Date  . Anemia   . Diabetes mellitus   . Headache(784.0)   . Hypertension   . Obesity     History   Social History  . Marital Status: Divorced    Spouse Name: N/A    Number of Children: N/A  . Years of Education: N/A   Occupational History  . Not on file.   Social History Main Topics  . Smoking status: Former Smoker    Quit date: 11/19/1994  . Smokeless tobacco: Not on file  . Alcohol Use: No  . Drug Use: Not on file  . Sexual Activity: Yes   Other Topics Concern  . Not on file   Social History Narrative  . No narrative on file    Past Surgical History  Procedure Laterality Date  . Appendectomy    . Cesarean section    . Tubal ligation      Family History  Problem Relation Age of Onset  . Diabetes Mother   . Arthritis Mother   . Hypertension Father   . Heart disease Father   . Hyperlipidemia Father     No Known  Allergies  Current Outpatient Prescriptions on File Prior to Visit  Medication Sig Dispense Refill  . azithromycin (ZITHROMAX) 250 MG tablet Two po now then one po daily for 4 days  6 tablet  0  . BENICAR HCT 40-25 MG per tablet TAKE ONE TABLET BY MOUTH EVERY DAY  30 tablet  11  . Insulin Aspart Prot & Aspart (NOVOLOG MIX 70/30 FLEXPEN) (70-30) 100 UNIT/ML SUPN Inject 40 Units into the skin 2 (two) times daily.  24 mL  11  . Insulin Glargine (LANTUS SOLOSTAR) 100 UNIT/ML Solostar Pen INJECT  25 UNITS SUBCUTANEOUSLY ONCE DAILY AT BEDTIME  3 mL  3  . KOMBIGLYZE XR 03-999 MG TB24 TAKE ONE TABLET BY MOUTH ONCE DAILY  30 tablet  5  . Pseudoephed HCl-CPM-DM HBr Tan (ATUSS DS) 30-4-30 MG/5ML SUSP Take 5 mLs by mouth 2 (two) times daily.  437 mL  0   No current facility-administered medications on file prior to visit.    EXAM: BP 126/76  Pulse 78  Temp(Src) 98 F (36.7 C) (Oral)  Resp 18  Wt 237 lb (107.502 kg)     Objective:   Physical Exam  Nursing note and vitals reviewed. Constitutional: She is oriented to  person, place, and time. She appears well-developed and well-nourished. No distress.  HENT:  Head: Normocephalic and atraumatic.  Eyes: Conjunctivae and EOM are normal. Pupils are equal, round, and reactive to light.  Neck: Normal range of motion.  Cardiovascular: Normal rate, regular rhythm and intact distal pulses.   Pulmonary/Chest: Effort normal and breath sounds normal. No respiratory distress. She exhibits no tenderness.  Musculoskeletal: Normal range of motion. She exhibits no edema.  Neurological: She is alert and oriented to person, place, and time.  Skin: Skin is warm and dry. No rash noted. She is not diaphoretic. No erythema. No pallor.  Psychiatric: She has a normal mood and affect. Her behavior is normal. Judgment and thought content normal.    Lab Results  Component Value Date   WBC 9.6 12/15/2012   HGB 10.4* 12/15/2012   HCT 31.8* 12/15/2012   PLT 267.0  12/15/2012   GLUCOSE 70 03/16/2013   CHOL 137 03/16/2013   TRIG 60.0 03/16/2013   HDL 50.80 03/16/2013   LDLCALC 74 03/16/2013   ALT 21 07/18/2012   AST 20 07/18/2012   NA 139 03/16/2013   K 3.8 03/16/2013   CL 105 03/16/2013   CREATININE 0.9 03/16/2013   BUN 16 03/16/2013   CO2 27 03/16/2013   TSH 1.33 02/18/2012   HGBA1C 6.0 05/26/2014   MICROALBUR 94.9* 02/18/2012         Assessment & Plan:  Abigail Yoder was seen today for 6 month follow up.  Diagnoses and associated orders for this visit:  Unspecified essential hypertension Comments: Stable on Benicar. Continue.   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 Will draw today. Has medication to continue. Will adjust depending on a1c. Stressed diet and exercise. - Hemoglobin A1c  Preventative health care Comments: Needs physcial. Will order lab work to return fasting. - CBC with Differential; Future - Basic Metabolic Panel; Future - Hepatic Function Panel; Future - Lipid Panel; Future - CBC with Differential - Basic Metabolic Panel - Hepatic Function Panel - Lipid Panel - POC Urinalysis Dipstick - Microalbumin/Creatinine Ratio, Urine    Pt has not had a Physical in over a year, and no a1c in over a year. a1c today, physical labs when fasting. Schedule a physical today prior to leaving.  Return precautions provided, and patient handout on HTN and Diabetes and Exercise.  Plan to follow up as needed, or for worsening or persistent symptoms despite treatment.  Patient Instructions  He will have your A1c drawn today, we'll call you with the results of this when it is available.  You need to come in fasting one morning to have your physical labs drawn.  Schedule an appointment to have this blood work done today, as well as an appointment to come in for your annual physical.  If emergency symptoms discussed during visit developed, seek medical attention  immediately.  Followup as needed, or for worsening or persistent symptoms despite treatment.

## 2014-05-26 NOTE — Patient Instructions (Signed)
He will have your A1c drawn today, we'll call you with the results of this when it is available.  You need to come in fasting one morning to have your physical labs drawn.  Schedule an appointment to have this blood work done today, as well as an appointment to come in for your annual physical.  If emergency symptoms discussed during visit developed, seek medical attention immediately.  Followup as needed, or for worsening or persistent symptoms despite treatment.    Diabetes and Exercise Exercising regularly is important. It is not just about losing weight. It has many health benefits, such as:  Improving your overall fitness, flexibility, and endurance.  Increasing your bone density.  Helping with weight control.  Decreasing your body fat.  Increasing your muscle strength.  Reducing stress and tension.  Improving your overall health. People with diabetes who exercise gain additional benefits because exercise:  Reduces appetite.  Improves the body's use of blood sugar (glucose).  Helps lower or control blood glucose.  Decreases blood pressure.  Helps control blood lipids (such as cholesterol and triglycerides).  Improves the body's use of the hormone insulin by:  Increasing the body's insulin sensitivity.  Reducing the body's insulin needs.  Decreases the risk for heart disease because exercising:  Lowers cholesterol and triglycerides levels.  Increases the levels of good cholesterol (such as high-density lipoproteins [HDL]) in the body.  Lowers blood glucose levels. YOUR ACTIVITY PLAN  Choose an activity that you enjoy and set realistic goals. Your health care provider or diabetes educator can help you make an activity plan that works for you. You can break activities into 2 or 3 sessions throughout the day. Doing so is as good as one long session. Exercise ideas include:  Taking the dog for a walk.  Taking the stairs instead of the elevator.  Dancing to  your favorite song.  Doing your favorite exercise with a friend. RECOMMENDATIONS FOR EXERCISING WITH TYPE 1 OR TYPE 2 DIABETES   Check your blood glucose before exercising. If blood glucose levels are greater than 240 mg/dL, check for urine ketones. Do not exercise if ketones are present.  Avoid injecting insulin into areas of the body that are going to be exercised. For example, avoid injecting insulin into:  The arms when playing tennis.  The legs when jogging.  Keep a record of:  Food intake before and after you exercise.  Expected peak times of insulin action.  Blood glucose levels before and after you exercise.  The type and amount of exercise you have done.  Review your records with your health care provider. Your health care provider will help you to develop guidelines for adjusting food intake and insulin amounts before and after exercising.  If you take insulin or oral hypoglycemic agents, watch for signs and symptoms of hypoglycemia. They include:  Dizziness.  Shaking.  Sweating.  Chills.  Confusion.  Drink plenty of water while you exercise to prevent dehydration or heat stroke. Body water is lost during exercise and must be replaced.  Talk to your health care provider before starting an exercise program to make sure it is safe for you. Remember, almost any type of activity is better than none. Document Released: 01/26/2004 Document Revised: 07/08/2013 Document Reviewed: 04/14/2013 [facility] Patient Information 2015 [place], [place]. This information is not intended to replace advice given to you by your health care provider. Make sure you discuss any questions you have with your health care provider. Hypertension Hypertension is another name for  high blood pressure. High blood pressure forces your heart to work harder to pump blood. A blood pressure reading has two numbers, which includes a higher number over a lower number (example: 110/72). HOME CARE   Have  your blood pressure rechecked by your doctor.  Only take medicine as told by your doctor. Follow the directions carefully. The medicine does not work as well if you skip doses. Skipping doses also puts you at risk for problems.  Do not smoke.  Monitor your blood pressure at home as told by your doctor. GET HELP IF:  You think you are having a reaction to the medicine you are taking.  You have repeat headaches or feel dizzy.  You have puffiness (swelling) in your ankles.  You have trouble with your vision. GET HELP RIGHT AWAY IF:   You get a very bad headache and are confused.  You feel weak, numb, or faint.  You get chest or belly (abdominal) pain.  You throw up (vomit).  You cannot breathe very well. MAKE SURE YOU:   Understand these instructions.  Will watch your condition.  Will get help right away if you are not doing well or get worse. Document Released: 04/23/2008 Document Revised: 11/10/2013 Document Reviewed: 08/28/2013 [facility] Patient Information 2015 [place], [place]. This information is not intended to replace advice given to you by your health care provider. Make sure you discuss any questions you have with your health care provider.

## 2014-05-26 NOTE — Progress Notes (Signed)
Pre visit review using our clinic review tool, if applicable. No additional management support is needed unless otherwise documented below in the visit note. 

## 2014-05-27 ENCOUNTER — Telehealth: Payer: Self-pay | Admitting: Internal Medicine

## 2014-05-27 NOTE — Telephone Encounter (Signed)
Relevant patient education mailed to patient.  

## 2014-05-28 ENCOUNTER — Telehealth: Payer: Self-pay | Admitting: Internal Medicine

## 2014-05-28 NOTE — Telephone Encounter (Addendum)
Pt had alc on 7/8 and results 6.0. Pt saw Matt tucker on 05-26-14

## 2014-05-28 NOTE — Telephone Encounter (Signed)
lmom for pt to cb. Pt needs diabetic bundle a1c,ldl added to cpx labs. Pt also needs follow up with other provider for bp

## 2014-05-31 ENCOUNTER — Other Ambulatory Visit: Payer: Self-pay | Admitting: Internal Medicine

## 2014-06-04 ENCOUNTER — Other Ambulatory Visit: Payer: BC Managed Care – PPO

## 2014-06-09 ENCOUNTER — Other Ambulatory Visit (HOSPITAL_COMMUNITY)
Admission: RE | Admit: 2014-06-09 | Discharge: 2014-06-09 | Disposition: A | Discharge status: Home / Self Care | Payer: BC Managed Care – PPO | Source: Ambulatory Visit | Attending: Internal Medicine | Admitting: Internal Medicine

## 2014-06-09 ENCOUNTER — Ambulatory Visit (INDEPENDENT_AMBULATORY_CARE_PROVIDER_SITE_OTHER): Payer: BC Managed Care – PPO | Admitting: Physician Assistant

## 2014-06-09 ENCOUNTER — Encounter: Payer: Self-pay | Admitting: Physician Assistant

## 2014-06-09 VITALS — BP 140/70 | HR 84 | Temp 98.7°F | Resp 18 | Ht 63.25 in | Wt 239.0 lb

## 2014-06-09 DIAGNOSIS — Z01419 Encounter for gynecological examination (general) (routine) without abnormal findings: Secondary | ICD-10-CM | POA: Insufficient documentation

## 2014-06-09 DIAGNOSIS — Z23 Encounter for immunization: Secondary | ICD-10-CM

## 2014-06-09 DIAGNOSIS — Z0000 Encounter for general adult medical examination without abnormal findings: Secondary | ICD-10-CM

## 2014-06-09 LAB — POCT URINALYSIS DIPSTICK
Bilirubin, UA: NEGATIVE
Glucose, UA: NEGATIVE
Ketones, UA: NEGATIVE
NITRITE UA: POSITIVE
PH UA: 5
Spec Grav, UA: 1.015
Urobilinogen, UA: 0.2

## 2014-06-09 NOTE — Progress Notes (Signed)
Pre visit review using our clinic review tool, if applicable. No additional management support is needed unless otherwise documented below in the visit note. 

## 2014-06-09 NOTE — Addendum Note (Signed)
Addended by: Joyce Gross R on: 06/09/2014 04:52 PM   Modules accepted: Orders

## 2014-06-09 NOTE — Progress Notes (Addendum)
Subjective:    Patient ID: Abigail Yoder, female    DOB: [birth year], 48 y.o.   MRN: [id number]  HPI Patient presents to clinic today to annual physical.   Health Maintenance: Dental -- Dr. Caleen Jobs Vision -- Dr. Wynetta Emery, wears prescription glasses. Dr. Satira Sark Immunizations -- needs tetanus and pna vax, will provide today. Colonoscopy -- Due at 57 Mammogram -- 02/22/2014 PAP -- 2013, normal  LMP- 06/03/2014  Review of Systems Patient denies chest pain, shortness of breath, orthopnea. Denies lower extremity edema, abdominal pain, change in appetite, change in bowel movements. Patient denies rashes, musculoskeletal complaints. No other specific complaints in a complete review of systems.    Past Medical History  Diagnosis Date  . Anemia   . Diabetes mellitus   . Headache(784.0)   . Hypertension   . Obesity     History   Social History  . Marital Status: Divorced    Spouse Name: N/A    Number of Children: N/A  . Years of Education: N/A   Occupational History  . Not on file.   Social History Main Topics  . Smoking status: Former Smoker    Quit date: 11/19/1994  . Smokeless tobacco: Not on file  . Alcohol Use: No  . Drug Use: Not on file  . Sexual Activity: Yes   Other Topics Concern  . Not on file   Social History Narrative  . No narrative on file    Past Surgical History  Procedure Laterality Date  . Appendectomy    . Cesarean section    . Tubal ligation      Family History  Problem Relation Age of Onset  . Diabetes Mother   . Arthritis Mother   . Hypertension Father   . Heart disease Father   . Hyperlipidemia Father     No Known Allergies  Current Outpatient Prescriptions on File Prior to Visit  Medication Sig Dispense Refill  . BENICAR HCT 40-25 MG per tablet TAKE ONE TABLET BY MOUTH EVERY DAY  30 tablet  11  . Insulin Glargine (LANTUS SOLOSTAR) 100 UNIT/ML Solostar Pen INJECT  25 UNITS SUBCUTANEOUSLY ONCE DAILY AT BEDTIME  3 mL  3    . KOMBIGLYZE XR 03-999 MG TB24 TAKE ONE TABLET BY MOUTH ONCE DAILY  30 tablet  5  . NOVOLOG MIX 70/30 FLEXPEN (70-30) 100 UNIT/ML Pen INJECT 40 UNITS SUBCUTANEOUSLY INTO SKIN TWICE A DAY  30 mL  0   No current facility-administered medications on file prior to visit.    EXAM: BP 140/70  Pulse 84  Temp(Src) 98.7 F (37.1 C) (Oral)  Resp 18  Ht 5' 3.25" (1.607 m)  Wt 239 lb (108.41 kg)  BMI 41.98 kg/m2     Objective:   Physical Exam  Nursing note and vitals reviewed. Constitutional: She is oriented to person, place, and time. She appears well-developed and well-nourished. No distress.  HENT:  Head: Normocephalic and atraumatic.  Right Ear: External ear normal.  Left Ear: External ear normal.  Nose: Nose normal.  Mouth/Throat: Oropharynx is clear and moist. No oropharyngeal exudate.  Bilat TMs normal.  Eyes: Conjunctivae and EOM are normal. Pupils are equal, round, and reactive to light. Right eye exhibits no discharge. Left eye exhibits no discharge. No scleral icterus.  Neck: Normal range of motion. Neck supple. No JVD present. No tracheal deviation present. No thyromegaly present.  Cardiovascular: Normal rate, regular rhythm, normal heart sounds and intact distal pulses.  Exam reveals no gallop  and no friction rub.   No murmur heard. Pulmonary/Chest: Effort normal and breath sounds normal. No stridor. No respiratory distress. She has no wheezes. She has no rales. She exhibits no tenderness.  Bilat Breast exam normal, no lumps, masses palpated.  Abdominal: Soft. Bowel sounds are normal. She exhibits no distension and no mass. There is no tenderness. There is no rebound and no guarding.  Genitourinary: Vagina normal and uterus normal. No vaginal discharge found.  Bimanual normal  Musculoskeletal: Normal range of motion. She exhibits no edema and no tenderness.  Foot exam completed and normal  Lymphadenopathy:    She has no cervical adenopathy.  Neurological: She is alert and  oriented to person, place, and time. She has normal reflexes. No cranial nerve deficit. She exhibits normal muscle tone. Coordination normal.  Skin: Skin is warm and dry. No rash noted. She is not diaphoretic. No erythema. No pallor.  Psychiatric: She has a normal mood and affect. Her behavior is normal. Judgment and thought content normal.     Lab Results  Component Value Date   WBC 6.6 05/26/2014   HGB 10.7* 05/26/2014   HCT 32.8* 05/26/2014   PLT 264.0 05/26/2014   GLUCOSE 112* 05/26/2014   CHOL 148 05/26/2014   TRIG 83.0 05/26/2014   HDL 51.20 05/26/2014   LDLCALC 80 05/26/2014   ALT 13 05/26/2014   AST 16 05/26/2014   NA 138 05/26/2014   K 4.4 05/26/2014   CL 105 05/26/2014   CREATININE 1.2 05/26/2014   BUN 20 05/26/2014   CO2 28 05/26/2014   TSH 1.33 02/18/2012   HGBA1C 6.0 05/26/2014   MICROALBUR 94.9* 02/18/2012        Assessment & Plan:  Abigail Yoder was seen today for annual exam.  Diagnoses and associated orders for this visit:  Need for prophylactic vaccination with combined diphtheria-tetanus-pertussis (DTP) vaccine - Tdap vaccine greater than or equal to 7yo IM  Need for prophylactic vaccination against Streptococcus pneumoniae (pneumococcus) - Pneumococcal polysaccharide vaccine 23-valent greater than or equal to 2yo subcutaneous/IM  Routine general medical examination at a health care facility - Cytology - PAP - Microalbumin/Creatinine Ratio, Urine    Diabetic Foot exam normal.  Health Maintenance UTD.  Return precautions provided, and patient handout on health maintenance.  Plan to follow up in about 3 months to reassess, or for worsening or persistent symptoms despite treatment.  Patient Instructions  We will call with your lab results when available.  Continue all current medications.  Schedule an appointment with Dr. Satira Sark for your annual eye exam.  Followup in 3 months to reassess, or for worsening or persistent symptoms despite treatment.

## 2014-06-09 NOTE — Patient Instructions (Addendum)
We will call with your lab results when available.  Continue all current medications.  Schedule an appointment with Dr. Satira Sark for your annual eye exam.  Followup in 3 months to reassess, or for worsening or persistent symptoms despite treatment.      Health Maintenance, Female A healthy lifestyle and preventative care can promote health and wellness.  Maintain regular health, dental, and eye exams.  Eat a healthy diet. Foods like vegetables, fruits, whole grains, low-fat dairy products, and lean protein foods contain the nutrients you need without too many calories. Decrease your intake of foods high in solid fats, added sugars, and salt. Get information about a proper diet from your caregiver, if necessary.  Regular physical exercise is one of the most important things you can do for your health. Most adults should get at least 150 minutes of moderate-intensity exercise (any activity that increases your heart rate and causes you to sweat) each week. In addition, most adults need muscle-strengthening exercises on 2 or more days a week.   Maintain a healthy weight. The body mass index (BMI) is a screening tool to identify possible weight problems. It provides an estimate of body fat based on height and weight. Your caregiver can help determine your BMI, and can help you achieve or maintain a healthy weight. For adults 20 years and older:  A BMI below 18.5 is considered underweight.  A BMI of 18.5 to 24.9 is normal.  A BMI of 25 to 29.9 is considered overweight.  A BMI of 30 and above is considered obese.  Maintain normal blood lipids and cholesterol by exercising and minimizing your intake of saturated fat. Eat a balanced diet with plenty of fruits and vegetables. Blood tests for lipids and cholesterol should begin at age 64 and be repeated every 5 years. If your lipid or cholesterol levels are high, you are over 50, or you are a high risk for heart disease, you may need your  cholesterol levels checked more frequently.Ongoing high lipid and cholesterol levels should be treated with medicines if diet and exercise are not effective.  If you smoke, find out from your caregiver how to quit. If you do not use tobacco, do not start.  Lung cancer screening is recommended for adults aged 15-80 years who are at high risk for developing lung cancer because of a history of smoking. Yearly low-dose computed tomography (CT) is recommended for people who have at least a 30-pack-year history of smoking and are a current smoker or have quit within the past 15 years. A pack year of smoking is smoking an average of 1 pack of cigarettes a day for 1 year (for example: 1 pack a day for 30 years or 2 packs a day for 15 years). Yearly screening should continue until the smoker has stopped smoking for at least 15 years. Yearly screening should also be stopped for people who develop a health problem that would prevent them from having lung cancer treatment.  If you are pregnant, do not drink alcohol. If you are breastfeeding, be very cautious about drinking alcohol. If you are not pregnant and choose to drink alcohol, do not exceed 1 drink per day. One drink is considered to be 12 ounces (355 mL) of beer, 5 ounces (148 mL) of wine, or 1.5 ounces (44 mL) of liquor.  Avoid use of street drugs. Do not share needles with anyone. Ask for help if you need support or instructions about stopping the use of drugs.  High blood  pressure causes heart disease and increases the risk of stroke. Blood pressure should be checked at least every 1 to 2 years. Ongoing high blood pressure should be treated with medicines, if weight loss and exercise are not effective.  If you are 2 to 48 years old, ask your caregiver if you should take aspirin to prevent strokes.  Diabetes screening involves taking a blood sample to check your fasting blood sugar level. This should be done once every 3 years, after age 45, if you are  within normal weight and without risk factors for diabetes. Testing should be considered at a younger age or be carried out more frequently if you are overweight and have at least 1 risk factor for diabetes.  Breast cancer screening is essential preventative care for women. You should practice "breast self-awareness." This means understanding the normal appearance and feel of your breasts and may include breast self-examination. Any changes detected, no matter how small, should be reported to a caregiver. Women in their 49s and 30s should have a clinical breast exam (CBE) by a caregiver as part of a regular health exam every 1 to 3 years. After age 47, women should have a CBE every year. Starting at age 77, women should consider having a mammogram (breast X-ray) every year. Women who have a family history of breast cancer should talk to their caregiver about genetic screening. Women at a high risk of breast cancer should talk to their caregiver about having an MRI and a mammogram every year.  Breast cancer gene (BRCA)-related cancer risk assessment is recommended for women who have family members with BRCA-related cancers. BRCA-related cancers include breast, ovarian, tubal, and peritoneal cancers. Having family members with these cancers may be associated with an increased risk for harmful changes (mutations) in the breast cancer genes BRCA1 and BRCA2. Results of the assessment will determine the need for genetic counseling and BRCA1 and BRCA2 testing.  The Pap test is a screening test for cervical cancer. Women should have a Pap test starting at age 88. Between ages 66 and 16, Pap tests should be repeated every 2 years. Beginning at age 67, you should have a Pap test every 3 years as long as the past 3 Pap tests have been normal. If you had a hysterectomy for a problem that was not cancer or a condition that could lead to cancer, then you no longer need Pap tests. If you are between ages 53 and 79, and you  have had normal Pap tests going back 10 years, you no longer need Pap tests. If you have had past treatment for cervical cancer or a condition that could lead to cancer, you need Pap tests and screening for cancer for at least 20 years after your treatment. If Pap tests have been discontinued, risk factors (such as a new sexual partner) need to be reassessed to determine if screening should be resumed. Some women have medical problems that increase the chance of getting cervical cancer. In these cases, your caregiver may recommend more frequent screening and Pap tests.  The human papillomavirus (HPV) test is an additional test that may be used for cervical cancer screening. The HPV test looks for the virus that can cause the cell changes on the cervix. The cells collected during the Pap test can be tested for HPV. The HPV test could be used to screen women aged 68 years and older, and should be used in women of any age who have unclear Pap test results. After  the age of 62, women should have HPV testing at the same frequency as a Pap test.  Colorectal cancer can be detected and often prevented. Most routine colorectal cancer screening begins at the age of 30 and continues through age 75. However, your caregiver may recommend screening at an earlier age if you have risk factors for colon cancer. On a yearly basis, your caregiver may provide home test kits to check for hidden blood in the stool. Use of a small camera at the end of a tube, to directly examine the colon (sigmoidoscopy or colonoscopy), can detect the earliest forms of colorectal cancer. Talk to your caregiver about this at age 15, when routine screening begins. Direct examination of the colon should be repeated every 5 to 10 years through age 32, unless early forms of pre-cancerous polyps or small growths are found.  Hepatitis C blood testing is recommended for all people born from [birth year] through 1965 and any individual with known risks for hepatitis  C.  Practice safe sex. Use condoms and avoid high-risk sexual practices to reduce the spread of sexually transmitted infections (STIs). Sexually active women aged 91 and younger should be checked for Chlamydia, which is a common sexually transmitted infection. Older women with new or multiple partners should also be tested for Chlamydia. Testing for other STIs is recommended if you are sexually active and at increased risk.  Osteoporosis is a disease in which the bones lose minerals and strength with aging. This can result in serious bone fractures. The risk of osteoporosis can be identified using a bone density scan. Women ages 34 and over and women at risk for fractures or osteoporosis should discuss screening with their caregivers. Ask your caregiver whether you should be taking a calcium supplement or vitamin D to reduce the rate of osteoporosis.  Menopause can be associated with physical symptoms and risks. Hormone replacement therapy is available to decrease symptoms and risks. You should talk to your caregiver about whether hormone replacement therapy is right for you.  Use sunscreen. Apply sunscreen liberally and repeatedly throughout the day. You should seek shade when your shadow is shorter than you. Protect yourself by wearing long sleeves, pants, a wide-brimmed hat, and sunglasses year round, whenever you are outdoors.  Notify your caregiver of new moles or changes in moles, especially if there is a change in shape or color. Also notify your caregiver if a mole is larger than the size of a pencil eraser.  Stay current with your immunizations. Document Released: 05/21/2011 Document Revised: 03/02/2013 Document Reviewed: 10/07/2013 [facility] Patient Information 2015 [place], [place]. This information is not intended to replace advice given to you by your health care provider. Make sure you discuss any questions you have with your health care provider.

## 2014-06-10 LAB — MICROALBUMIN / CREATININE URINE RATIO
Creatinine,U: 127.5 mg/dL
MICROALB UR: 18 mg/dL — AB (ref 0.0–1.9)
MICROALB/CREAT RATIO: 14.1 mg/g (ref 0.0–30.0)

## 2014-06-11 ENCOUNTER — Other Ambulatory Visit: Payer: Self-pay | Admitting: Physician Assistant

## 2014-06-11 DIAGNOSIS — N390 Urinary tract infection, site not specified: Secondary | ICD-10-CM

## 2014-06-11 LAB — CYTOLOGY - PAP

## 2014-06-11 MED ORDER — NITROFURANTOIN MONOHYD MACRO 100 MG PO CAPS: 100.0000 mg | ORAL_CAPSULE | Freq: Two times a day (BID) | ORAL | Status: DC

## 2014-07-21 ENCOUNTER — Other Ambulatory Visit: Payer: Self-pay | Admitting: Internal Medicine

## 2014-09-03 ENCOUNTER — Ambulatory Visit (INDEPENDENT_AMBULATORY_CARE_PROVIDER_SITE_OTHER): Payer: BC Managed Care – PPO | Admitting: Physician Assistant

## 2014-09-03 ENCOUNTER — Encounter: Payer: Self-pay | Admitting: Physician Assistant

## 2014-09-03 VITALS — BP 138/78 | HR 72 | Temp 98.4°F | Resp 18 | Wt 236.2 lb

## 2014-09-03 DIAGNOSIS — I10 Essential (primary) hypertension: Secondary | ICD-10-CM

## 2014-09-03 DIAGNOSIS — E119 Type 2 diabetes mellitus without complications: Secondary | ICD-10-CM

## 2014-09-03 LAB — BASIC METABOLIC PANEL
BUN: 19 mg/dL (ref 6–23)
CO2: 24 mEq/L (ref 19–32)
Calcium: 9.2 mg/dL (ref 8.4–10.5)
Chloride: 108 mEq/L (ref 96–112)
Creatinine, Ser: 1.1 mg/dL (ref 0.4–1.2)
GFR: 68.02 mL/min (ref >60.00)
Glucose, Bld: 109 mg/dL — ABNORMAL HIGH (ref 70–99)
Potassium: 3.7 mEq/L (ref 3.5–5.1)
SODIUM: 141 meq/L (ref 135–145)

## 2014-09-03 LAB — HEMOGLOBIN A1C: Hgb A1c MFr Bld: 6.5 % (ref 4.6–6.5)

## 2014-09-03 MED ORDER — INSULIN GLARGINE 300 UNIT/ML ~~LOC~~ SOPN: 25.0000 [IU] | PEN_INJECTOR | Freq: Every day | SUBCUTANEOUS | Status: DC

## 2014-09-03 NOTE — Progress Notes (Signed)
Pre visit review using our clinic review tool, if applicable. No additional management support is needed unless otherwise documented below in the visit note. 

## 2014-09-03 NOTE — Patient Instructions (Addendum)
We will call with the results of your lab work when available.  Continue your current medication regimen as directed.  If emergency symptoms discussed during visit developed, seek medical attention immediately.  Followup in 3 months to reassess, or for worsening or persistent symptoms despite treatment.    Type 2 Diabetes Mellitus Type 2 diabetes mellitus is a long-term (chronic) disease. In type 2 diabetes:  The pancreas does not make enough of a hormone called insulin.  The cells in the body do not respond as well to the insulin that is made.  Both of the above can happen. Normally, insulin moves sugars from food into tissue cells. This gives you energy. If you have type 2 diabetes, sugars cannot be moved into tissue cells. This causes high blood sugar (hyperglycemia).  HOME CARE  Have your hemoglobin A1c level checked twice a year. The level shows if your diabetes is under control or out of control.  Test your blood sugar level every day as told by your doctor.  Check your ketone levels by testing your pee (urine) when you are sick and as told.  Take your diabetes or insulin medicine as told by your doctor.  Never run out of insulin.  Adjust how much insulin you give yourself based on how many carbs (carbohydrates) you eat. Carbs are in many foods, such as fruits, vegetables, whole grains, and dairy products.  Have a healthy snack between every healthy meal. Have 3 meals and 3 snacks a day.  Lose weight if you are overweight.  Carry a medical alert card or wear your medical alert jewelry.  Carry a 15-gram carb snack with you at all times. Examples include:  Glucose pills, 3 or 4.  Glucose gel, 15-gram tube.  Raisins, 2 tablespoons (24 grams).  Jelly beans, 6.  Animal crackers, 8.  Regular (not diet) pop, 4 ounces (120 milliliters).  Gummy treats, 9.  Notice low blood sugar (hypoglycemia) symptoms, such as:  Shaking (tremors).  Trouble thinking  clearly.  Sweating.  Faster heart rate.  Headache.  Dry mouth.  Hunger.  Crabbiness (irritability).  Being worried or tense (anxious).  Restless sleep.  A change in speech or coordination.  Confusion.  Treat low blood sugar right away. If you are alert and can swallow, follow the 15:15 rule:  Take 15-20 grams of a rapid-acting glucose or carb. This includes glucose gel, glucose pills, or 4 ounces (120 milliliters) of fruit juice, regular pop, or low-fat milk.  Check your blood sugar level 15 minutes after taking the glucose.  Take 15-20 grams more of glucose if the repeat blood sugar level is still 70 mg/dL (milligrams/deciliter) or below.  Eat a meal or snack within 1 hour of the blood sugar levels going back to normal.  Notice early symptoms of high blood sugar, such as:  Being really thirsty or drinking a lot (polydipsia).  Peeing a lot (polyuria).  Do at least 150 minutes of physical activity a week or as told.  Split the 150 minutes of activity up during the week. Do not do 150 minutes of activity in one day.  Perform exercises, such as weight lifting, at least 2 times a week or as told.  Spend no more than 90 minutes at one time inactive.  Adjust your insulin or food intake as needed if you start a new exercise or sport.  Follow your sick-day plan when you are not able to eat or drink as usual.  Do not smoke, chew tobacco, or  use electronic cigarettes.  Women who are not pregnant should drink no more than 1 drink a day. Men should drink no more than 2 drinks a day.  Only drink alcohol with food.  Ask your doctor if alcohol is safe for you.  Tell your doctor if you drink alcohol several times during the week.  See your doctor regularly.  Schedule an eye exam soon after you are told you have diabetes. Schedule exams once every year.  Check your skin and feet every day. Check for cuts, bruises, redness, nail problems, bleeding, blisters, or sores. A  doctor should do a foot exam once a year.  Brush your teeth and gums twice a day. Floss once a day. Visit your dentist regularly.  Share your diabetes plan with your workplace or school.  Stay up-to-date with shots that fight against diseases (immunizations).  Learn how to deal with stress.  Get diabetes education and support as needed.  Ask your doctor for special help if:  You need help to maintain or improve how you do things on your own.  You need help to maintain or improve the quality of your life.  You have foot or hand problems.  You have trouble cleaning yourself, dressing, eating, or doing physical activity. GET HELP IF:  You are unable to eat or drink for more than 6 hours.  You feel sick to your stomach (nauseous) or throw up (vomit) for more than 6 hours.  Your blood sugar level is over 240 mg/dL.  There is a change in mental status.  You get another serious illness.  You have watery poop (diarrhea) for more than 6 hours.  You have been sick or have had a fever for 2 or more days and are not getting better.  You have pain when you are active. GET HELP RIGHT AWAY IF:  You have trouble breathing.  Your ketone levels are higher than your doctor says they should be. MAKE SURE YOU:  Understand these instructions.  Will watch your condition.  Will get help right away if you are not doing well or get worse. Document Released: 08/14/2008 Document Revised: 03/22/2014 Document Reviewed: 06/06/2012 [facility] Patient Information 2015 [place], [place]. This information is not intended to replace advice given to you by your health care provider. Make sure you discuss any questions you have with your health care provider. Hypertension Hypertension is another name for high blood pressure. High blood pressure forces your heart to work harder to pump blood. A blood pressure reading has two numbers, which includes a higher number over a lower number (example:  110/72). HOME CARE   Have your blood pressure rechecked by your doctor.  Only take medicine as told by your doctor. Follow the directions carefully. The medicine does not work as well if you skip doses. Skipping doses also puts you at risk for problems.  Do not smoke.  Monitor your blood pressure at home as told by your doctor. GET HELP IF:  You think you are having a reaction to the medicine you are taking.  You have repeat headaches or feel dizzy.  You have puffiness (swelling) in your ankles.  You have trouble with your vision. GET HELP RIGHT AWAY IF:   You get a very bad headache and are confused.  You feel weak, numb, or faint.  You get chest or belly (abdominal) pain.  You throw up (vomit).  You cannot breathe very well. MAKE SURE YOU:   Understand these instructions.  Will watch  your condition.  Will get help right away if you are not doing well or get worse. Document Released: 04/23/2008 Document Revised: 11/10/2013 Document Reviewed: 08/28/2013 [facility] Patient Information 2015 [place], [place]. This information is not intended to replace advice given to you by your health care provider. Make sure you discuss any questions you have with your health care provider.

## 2014-09-03 NOTE — Progress Notes (Signed)
Subjective:    Patient ID: Abigail Yoder, female    DOB: [birth year], 48 y.o.   MRN: [id number]  HPI Patient is a 48 y.o. female presenting for follow up. DMII- novolog 30/70 sliding scale and Kombiglyze XR, and Lantus 25 units at night. She states she is tolerating these medications well and denies adverse effects to treatment. She states that her average AM sugars are 130 to 140. Last A1c in 05/26/2014 was 6.0.   HTN- controlled with Benicar 40-25mg  daily. She is tolerating this well and denies adverse effects to treatment. Patient denies fevers, chills, nausea, vomiting, diarrhea, shortness of breath, chest pain, headache, syncope.    Review of Systems As per HPI and are otherwise negative.    Past Medical History  Diagnosis Date  . Anemia   . Diabetes mellitus   . Headache(784.0)   . Hypertension   . Obesity     History   Social History  . Marital Status: Divorced    Spouse Name: N/A    Number of Children: N/A  . Years of Education: N/A   Occupational History  . Not on file.   Social History Main Topics  . Smoking status: Former Smoker    Quit date: 11/19/1994  . Smokeless tobacco: Not on file  . Alcohol Use: No  . Drug Use: Not on file  . Sexual Activity: Yes   Other Topics Concern  . Not on file   Social History Narrative  . No narrative on file    Past Surgical History  Procedure Laterality Date  . Appendectomy    . Cesarean section    . Tubal ligation      Family History  Problem Relation Age of Onset  . Diabetes Mother   . Arthritis Mother   . Hypertension Father   . Heart disease Father   . Hyperlipidemia Father     No Known Allergies  Current Outpatient Prescriptions on File Prior to Visit  Medication Sig Dispense Refill  . BENICAR HCT 40-25 MG per tablet TAKE ONE TABLET BY MOUTH EVERY DAY  30 tablet  11  . Insulin Glargine (LANTUS SOLOSTAR) 100 UNIT/ML Solostar Pen INJECT  25 UNITS SUBCUTANEOUSLY ONCE DAILY AT BEDTIME  3 mL  3    . KOMBIGLYZE XR 03-999 MG TB24 TAKE ONE TABLET BY MOUTH ONCE DAILY  30 tablet  5  . nitrofurantoin, macrocrystal-monohydrate, (MACROBID) 100 MG capsule Take 1 capsule (100 mg total) by mouth 2 (two) times daily.  14 capsule  0  . NOVOLOG MIX 70/30 FLEXPEN (70-30) 100 UNIT/ML Pen INJECT 40 UNITS SUBCUTANEOUSLY INTO SKIN TWICE A DAY  30 pen  1   No current facility-administered medications on file prior to visit.    EXAM: BP 138/78  Pulse 72  Temp(Src) 98.4 F (36.9 C) (Oral)  Resp 18  Wt 236 lb 3.2 oz (107.14 kg)     Objective:   Physical Exam  Nursing note and vitals reviewed. Constitutional: She is oriented to person, place, and time. She appears well-developed and well-nourished. No distress.  HENT:  Head: Normocephalic and atraumatic.  Eyes: Conjunctivae and EOM are normal.  Cardiovascular: Normal rate, regular rhythm and intact distal pulses.   Pulmonary/Chest: Effort normal and breath sounds normal. No respiratory distress. She exhibits no tenderness.  Musculoskeletal: She exhibits no edema.  Neurological: She is alert and oriented to person, place, and time.  Skin: Skin is warm and dry. She is not diaphoretic. No pallor.  Psychiatric: She  has a normal mood and affect. Her behavior is normal. Judgment and thought content normal.     Lab Results  Component Value Date   WBC 6.6 05/26/2014   HGB 10.7* 05/26/2014   HCT 32.8* 05/26/2014   PLT 264.0 05/26/2014   GLUCOSE 112* 05/26/2014   CHOL 148 05/26/2014   TRIG 83.0 05/26/2014   HDL 51.20 05/26/2014   LDLCALC 80 05/26/2014   ALT 13 05/26/2014   AST 16 05/26/2014   NA 138 05/26/2014   K 4.4 05/26/2014   CL 105 05/26/2014   CREATININE 1.2 05/26/2014   BUN 20 05/26/2014   CO2 28 05/26/2014   TSH 1.33 02/18/2012   HGBA1C 6.0 05/26/2014   MICROALBUR 18.0* 06/09/2014       Assessment & Plan:  Abigail Yoder was seen today for follow-up.  Diagnoses and associated orders for this visit:  Type 2 diabetes mellitus without complication Comments: Well  controlled on current regimen. will continue. Obtain A1c. - Hemoglobin [id number] - Basic Metabolic Panel  Essential hypertension Comments: Stable on current regimen. Will continue. Obtain BMP. - Basic Metabolic Panel  Other Orders - Insulin Glargine (TOUJEO SOLOSTAR) 300 UNIT/ML SOPN; Inject 25 units of lipase into the skin daily.    Switched Lantus to Toujeo, samples provided.  Return precautions provided, and patient handout on HTN, DM II.  Plan to follow up in 3 months to reassess, or for worsening or persistent symptoms despite treatment.  Patient Instructions  We will call with the results of your lab work when available.  Continue your current medication regimen as directed.  If emergency symptoms discussed during visit developed, seek medical attention immediately.  Followup in 3 months to reassess, or for worsening or persistent symptoms despite treatment.

## 2014-09-09 ENCOUNTER — Ambulatory Visit: Payer: BC Managed Care – PPO | Admitting: Physician Assistant

## 2014-10-27 ENCOUNTER — Telehealth: Payer: Self-pay | Admitting: Internal Medicine

## 2014-10-27 MED ORDER — INSULIN ASPART PROT & ASPART (70-30 MIX) 100 UNIT/ML PEN: PEN_INJECTOR | SUBCUTANEOUS | Status: DC

## 2014-10-27 NOTE — Telephone Encounter (Signed)
Done

## 2014-10-27 NOTE — Telephone Encounter (Signed)
[id number] - [place], [place] [phone] is requesting re-fill on NOVOLOG MIX 70/30 FLEXPEN (70-30) 100 UNIT/ML Pen

## 2014-11-15 ENCOUNTER — Telehealth: Payer: Self-pay | Admitting: Internal Medicine

## 2014-11-15 MED ORDER — OLMESARTAN MEDOXOMIL-HCTZ 40-25 MG PO TABS: 1.0000 | ORAL_TABLET | Freq: Every day | ORAL | Status: DC

## 2014-11-15 NOTE — Telephone Encounter (Signed)
One month supply sent to pharmacy

## 2014-11-15 NOTE — Telephone Encounter (Signed)
Patient would like to know if you can re-fill her BENICAR HCT 40-25 MG per tablet. Patient is scheduled to transfer to [facility] on 12/10/14.   [facility] [place], [place] - [address]

## 2014-11-23 ENCOUNTER — Other Ambulatory Visit: Payer: Self-pay | Admitting: Family

## 2014-12-08 ENCOUNTER — Ambulatory Visit: Payer: BC Managed Care – PPO | Admitting: Family

## 2014-12-10 ENCOUNTER — Ambulatory Visit: Payer: BC Managed Care – PPO | Admitting: Family

## 2014-12-20 ENCOUNTER — Telehealth: Payer: Self-pay | Admitting: Internal Medicine

## 2014-12-20 MED ORDER — SAXAGLIPTIN-METFORMIN ER 5-1000 MG PO TB24: 1.0000 | ORAL_TABLET | Freq: Every day | ORAL | Status: DC

## 2014-12-20 MED ORDER — OLMESARTAN MEDOXOMIL-HCTZ 40-25 MG PO TABS: 1.0000 | ORAL_TABLET | Freq: Every day | ORAL | Status: DC

## 2014-12-20 MED ORDER — INSULIN ASPART PROT & ASPART (70-30 MIX) 100 UNIT/ML PEN: PEN_INJECTOR | SUBCUTANEOUS | Status: DC

## 2014-12-20 NOTE — Telephone Encounter (Signed)
Medications refilled

## 2014-12-20 NOTE — Telephone Encounter (Signed)
Patient need re-fill on KOMBIGLYZE XR 03-999 MG TB24 and olmesartan-hydrochlorothiazide (BENICAR HCT) 40-25 MG per tablet sent to [facility] [place], [place] - [address]  And NOVOLOG MIX 70/30 FLEXPEN (70-30) 100 UNIT/ML Pen sent to [id number] - [place], [place] - [place].

## 2015-01-25 ENCOUNTER — Ambulatory Visit: Payer: Self-pay | Admitting: Family Medicine

## 2015-11-20 DIAGNOSIS — C50919 Malignant neoplasm of unspecified site of unspecified female breast: Secondary | ICD-10-CM

## 2015-11-20 HISTORY — DX: Malignant neoplasm of unspecified site of unspecified female breast: C50.919

## 2016-11-21 ENCOUNTER — Other Ambulatory Visit: Payer: Self-pay | Admitting: Radiology

## 2016-11-26 ENCOUNTER — Telehealth: Payer: Self-pay | Admitting: *Deleted

## 2016-11-26 NOTE — Telephone Encounter (Signed)
Confirmed [facility] for 11/28/16 at 0815 .  Instructions and contact information given.

## 2016-11-27 ENCOUNTER — Other Ambulatory Visit: Payer: Self-pay | Admitting: *Deleted

## 2016-11-27 DIAGNOSIS — Z171 Estrogen receptor negative status [ER-]: Principal | ICD-10-CM

## 2016-11-27 DIAGNOSIS — Z170 Estrogen receptor positive status [ER+]: Secondary | ICD-10-CM

## 2016-11-27 DIAGNOSIS — C50212 Malignant neoplasm of upper-inner quadrant of left female breast: Secondary | ICD-10-CM

## 2016-11-28 ENCOUNTER — Other Ambulatory Visit (HOSPITAL_BASED_OUTPATIENT_CLINIC_OR_DEPARTMENT_OTHER): Payer: BLUE CROSS/BLUE SHIELD

## 2016-11-28 ENCOUNTER — Ambulatory Visit: Payer: BLUE CROSS/BLUE SHIELD | Attending: Surgery | Admitting: Physical Therapy

## 2016-11-28 ENCOUNTER — Other Ambulatory Visit: Payer: Self-pay | Admitting: Surgery

## 2016-11-28 ENCOUNTER — Telehealth: Payer: Self-pay | Admitting: *Deleted

## 2016-11-28 ENCOUNTER — Encounter: Payer: Self-pay | Admitting: General Practice

## 2016-11-28 ENCOUNTER — Encounter: Payer: Self-pay | Admitting: Physical Therapy

## 2016-11-28 ENCOUNTER — Ambulatory Visit
Admission: RE | Admit: 2016-11-28 | Discharge: 2016-11-28 | Disposition: A | Discharge status: Home / Self Care | Payer: BLUE CROSS/BLUE SHIELD | Source: Ambulatory Visit | Attending: Radiation Oncology | Admitting: Radiation Oncology

## 2016-11-28 ENCOUNTER — Ambulatory Visit (HOSPITAL_BASED_OUTPATIENT_CLINIC_OR_DEPARTMENT_OTHER): Payer: BLUE CROSS/BLUE SHIELD | Admitting: Oncology

## 2016-11-28 ENCOUNTER — Other Ambulatory Visit: Payer: Self-pay | Admitting: *Deleted

## 2016-11-28 ENCOUNTER — Encounter: Payer: Self-pay | Admitting: Oncology

## 2016-11-28 VITALS — BP 184/83 | HR 111 | Temp 98.3°F | Resp 20 | Ht 63.25 in | Wt 222.8 lb

## 2016-11-28 DIAGNOSIS — C50212 Malignant neoplasm of upper-inner quadrant of left female breast: Secondary | ICD-10-CM

## 2016-11-28 DIAGNOSIS — Z170 Estrogen receptor positive status [ER+]: Principal | ICD-10-CM

## 2016-11-28 DIAGNOSIS — R293 Abnormal posture: Secondary | ICD-10-CM | POA: Diagnosis present

## 2016-11-28 DIAGNOSIS — C50912 Malignant neoplasm of unspecified site of left female breast: Secondary | ICD-10-CM

## 2016-11-28 LAB — DRAW EXTRA CLOT TUBE

## 2016-11-28 LAB — CBC WITH DIFFERENTIAL/PLATELET
BASO%: 0.7 % (ref 0.0–2.0)
BASOS ABS: 0.1 10*3/uL (ref 0.0–0.1)
EOS ABS: 0.1 10*3/uL (ref 0.0–0.5)
EOS%: 1.6 % (ref 0.0–7.0)
HCT: 33.2 % — ABNORMAL LOW (ref 34.8–46.6)
HEMOGLOBIN: 10.9 g/dL — AB (ref 11.6–15.9)
LYMPH%: 17.4 % (ref 14.0–49.7)
MCH: 29.6 pg (ref 25.1–34.0)
MCHC: 32.7 g/dL (ref 31.5–36.0)
MCV: 90.5 fL (ref 79.5–101.0)
MONO#: 0.4 10*3/uL (ref 0.1–0.9)
MONO%: 4.3 % (ref 0.0–14.0)
NEUT#: 7.2 10*3/uL — ABNORMAL HIGH (ref 1.5–6.5)
NEUT%: 76 % (ref 38.4–76.8)
Platelets: 256 10*3/uL (ref 145–400)
RBC: 3.67 10*6/uL — AB (ref 3.70–5.45)
RDW: 14 % (ref 11.2–14.5)
WBC: 9.4 10*3/uL (ref 3.9–10.3)
lymph#: 1.6 10*3/uL (ref 0.9–3.3)

## 2016-11-28 LAB — COMPREHENSIVE METABOLIC PANEL
ALT: 16 U/L (ref 0–55)
ANION GAP: 10 meq/L (ref 3–11)
AST: 16 U/L (ref 5–34)
Albumin: 3.4 g/dL — ABNORMAL LOW (ref 3.5–5.0)
Alkaline Phosphatase: 133 U/L (ref 40–150)
BILIRUBIN TOTAL: 0.22 mg/dL (ref 0.20–1.20)
BUN: 28.9 mg/dL — ABNORMAL HIGH (ref 7.0–26.0)
CHLORIDE: 107 meq/L (ref 98–109)
CO2: 24 meq/L (ref 22–29)
Calcium: 9.8 mg/dL (ref 8.4–10.4)
Creatinine: 1.3 mg/dL — ABNORMAL HIGH (ref 0.6–1.1)
EGFR: 55 mL/min/{1.73_m2} — ABNORMAL LOW (ref >90)
GLUCOSE: 144 mg/dL — AB (ref 70–140)
Potassium: 4.2 mEq/L (ref 3.5–5.1)
SODIUM: 141 meq/L (ref 136–145)
TOTAL PROTEIN: 8.3 g/dL (ref 6.4–8.3)

## 2016-11-28 NOTE — Telephone Encounter (Signed)
Ordered oncotype on core biopsy per Dr. Jana Hakim.  Faxed requisition to pathology and confirmed receipt with [facility].  Faxed PA to [facility].

## 2016-11-28 NOTE — Therapy (Signed)
[place], [place], [address] Phone: [phone]   Fax:  [phone]  Physical Therapy Evaluation  Patient Details  Name: DAVYN ELSASSER MRN: [id number] Date of Birth: [birth year] Referring Provider: Dr. Alphonsa Overall  Encounter Date: 11/28/2016      PT End of Session - 11/28/16 1003    Visit Number 1   Number of Visits 1   PT Start Time 0932   PT Stop Time 0943  Also saw pt from 1020-1040 for a total of 31 minutes   PT Time Calculation (min) 11 min   Activity Tolerance Patient tolerated treatment well   Behavior During Therapy [facility] for tasks assessed/performed      Past Medical History:  Diagnosis Date  . Anemia   . Diabetes mellitus   . Headache(784.0)   . Hypertension   . Obesity     Past Surgical History:  Procedure Laterality Date  . APPENDECTOMY    . CESAREAN SECTION    . TUBAL LIGATION      There were no vitals filed for this visit.       Subjective Assessment - 11/28/16 1003    Subjective Patient reports she is here today to be seen by her medical team for her newly diagnosed left breast cancer.   Patient is accompained by: Family member   Pertinent History Patient was diagnosed on 11/14/16 with left grade 3 invasive ductal carcinoma breast cancer. It measures 1.2 cm and is located in the upper inner quadrant. It is ER/PR positive and HER2 negative with a Ki67 of 90%.   Patient Stated Goals Reduce lymphedema risk and learn post op shoulder ROM HEP            [facility] PT Assessment - 11/28/16 0001      Assessment   Medical Diagnosis Left breast cancer   Referring Provider Dr. Alphonsa Overall   Onset Date/Surgical Date 11/14/16   Hand Dominance Right   Prior Therapy none     Precautions   Precautions Other (comment)   Precaution Comments active cancer     Restrictions   Weight Bearing Restrictions No     Balance Screen   Has the patient fallen in the past 6 months No    Has the patient had a decrease in activity level because of a fear of falling?  No   Is the patient reluctant to leave their home because of a fear of falling?  No     Home Environment   Living Environment Private residence   Living Arrangements Spouse/significant other;Children  Husband and 51 y.o. 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   Leisure She does not exercise     Cognition   Overall Cognitive Status Within Functional Limits for tasks assessed     Posture/Postural Control   Posture/Postural Control Postural limitations   Postural Limitations Rounded Shoulders;Forward head     ROM / Strength   AROM / PROM / Strength AROM;Strength     AROM   AROM Assessment Site Shoulder;Cervical   Right/Left Shoulder Right;Left   Right Shoulder Extension 54 Degrees   Right Shoulder Flexion 154 Degrees   Right Shoulder ABduction 161 Degrees   Right Shoulder Internal Rotation 67 Degrees   Right Shoulder External Rotation 82 Degrees  Left Shoulder Extension 49 Degrees   Left Shoulder Flexion 142 Degrees   Left Shoulder ABduction 160 Degrees   Left Shoulder Internal Rotation 55 Degrees   Left Shoulder External Rotation 81 Degrees   Cervical Flexion WNL   Cervical Extension WNl   Cervical - Right Side Bend WNl   Cervical - Left Side Bend WNL   Cervical - Right Rotation WNL   Cervical - Left Rotation WNL     Strength   Overall Strength Within functional limits for tasks performed           LYMPHEDEMA/ONCOLOGY QUESTIONNAIRE - 11/28/16 1002      Type   Cancer Type Left breast cancer     Lymphedema Assessments   Lymphedema Assessments Upper extremities     Right Upper Extremity Lymphedema   10 cm Proximal to Olecranon Process 35.1 cm   Olecranon Process 28.5 cm   10 cm Proximal to  Ulnar Styloid Process 25.2 cm   Just Proximal to Ulnar Styloid Process 19.7 cm   Across Hand at [employer] 19.7 cm   At [place] of 2nd Digit 7 cm     Left Upper Extremity Lymphedema   10 cm Proximal to Olecranon Process 34.9 cm   Olecranon Process 28.1 cm   10 cm Proximal to Ulnar Styloid Process 24.7 cm   Just Proximal to Ulnar Styloid Process 19.3 cm   Across Hand at [employer] 20.1 cm   At [place] of 2nd Digit 6.9 cm       Patient was instructed today in a home exercise program today for post op shoulder range of motion. These included active assist shoulder flexion in sitting, scapular retraction, wall walking with shoulder abduction, and hands behind head external rotation.  She was encouraged to do these twice a day, holding 3 seconds and repeating 5 times when permitted by her physician.         PT Education - 11/28/16 1002    Education provided Yes   Education Details Lymphedema risk reduction and post op shoulder ROM HEP   Person(s) Educated Patient;Spouse;Parent(s)   Methods Explanation;Demonstration;Handout   Comprehension Verbalized understanding;Returned demonstration              Breast Clinic Goals - 11/28/16 1006      Patient will be able to verbalize understanding of pertinent lymphedema risk reduction practices relevant to her diagnosis specifically related to skin care.   Time 1   Period Days   Status Achieved     Patient will be able to return demonstrate and/or verbalize understanding of the post-op home exercise program related to regaining shoulder range of motion.   Time 1   Period Days   Status Achieved     Patient will be able to verbalize understanding of the importance of attending the postoperative After Breast Cancer Class for further lymphedema risk reduction education and therapeutic exercise.   Time 1   Period Days   Status Achieved              Plan - 11/28/16 1003    Clinical Impression Statement Patient was  diagnosed on 11/14/16 with left grade 3 invasive ductal carcinoma breast cancer. It measures 1.2 cm and is located in the upper inner quadrant. It is ER/PR positive and HER2 negative with a Ki67 of 90%. Her multidisciplinary medical team met prior to her assessments to determine a recommended treatment plan.  She is planning to have genetic testing, a left lumpectomy  with a sentinel node biopsy, radiation, and anti-estrogen therapy. She may benefit from post op PT to regain shoulder ROM and reduce lymphedema risk. Due to her lack of comorbidities, her eval is of low complexity.   Rehab Potential Excellent   Clinical Impairments Affecting Rehab Potential None   PT Frequency One time visit   PT Treatment/Interventions Patient/family education;Therapeutic exercise   PT Next Visit Plan Will f/u after surgery to determine PT needs   PT Home Exercise Plan Post op shoulder ROM HEP   Consulted and Agree with Plan of Care Patient;Family member/caregiver   Family Member Consulted Husband and mother      Patient will benefit from skilled therapeutic intervention in order to improve the following deficits and impairments:  Postural dysfunction, Decreased knowledge of precautions, Pain, Impaired UE functional use, Decreased range of motion  Visit Diagnosis: Abnormal posture - Plan: PT plan of care cert/re-cert  Carcinoma of upper-inner quadrant of left breast in female, estrogen receptor positive ([place]) - Plan: PT plan of care cert/re-cert   Patient will follow up at outpatient cancer rehab if needed following surgery.  If the patient requires physical therapy at that time, a specific plan will be dictated and sent to the referring physician for approval. The patient was educated today on appropriate basic range of motion exercises to begin post operatively and the importance of attending the After Breast Cancer class following surgery.  Patient was educated today on lymphedema risk reduction practices as it  pertains to recommendations that will benefit the patient immediately following surgery.  She verbalized good understanding.  No additional physical therapy is indicated at this time.      Problem List Patient Active Problem List   Diagnosis Date Noted  . Malignant neoplasm of upper-inner quadrant of left breast in female, estrogen receptor positive ([place]) 11/27/2016  . 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 Obesity (BMI 30-39.9) 05/17/2011  . Essential hypertension 08/01/2007  . Type 2 diabetes mellitus ([place]) 06/30/2007  . HYPOGLYCEMIA NOS 06/30/2007  . ANEMIA-IRON DEFICIENCY 06/30/2007  . HEADACHE 06/30/2007   Annia Friendly, PT 11/28/16 11:09 AM  [place], [place], [address] Phone: [phone]   Fax:  [phone]  Name: TANDI HANKO MRN: [id number] Date of Birth: May 01, 1966

## 2016-11-28 NOTE — Progress Notes (Signed)
Nutrition Assessment  Reason for Assessment:  Pt seen in Breast Clinic  ASSESSMENT:   51 year old female with new diagnosis of left breast cancer.  Past medical history of DM, HTN.   Medications:  Aspart, lantus, saxagliptin-metformin  Labs: reviewed  Anthropometrics:   Height: 63 inches Weight: 222 lb BMI: 39.2   NUTRITION DIAGNOSIS: Food and nutrition related knowledge deficit related to new diagnosis of breast cancer as evidenced by no prior need for nutrition related information.  INTERVENTION:   Discussed and provided packet of information regarding nutritional tips for breast cancer patients.  Questions answered.  Teachback method used.      MONITORING, EVALUATION, and GOAL: Pt will consume a healthy plant based diet to maintain lean body mass throughout treatment.   Jaeger Trueheart B. Zenia Resides, [place], [place] (pager)

## 2016-11-28 NOTE — Progress Notes (Signed)
Radiation Oncology         [phone] ________________________________  Initial Outpatient Consultation  Name: Abigail Yoder MRN: [id number]  Date: 11/28/2016  DOB: [birth year]  HA:LPFXT Abigail Mercury, MD  Alphonsa Overall, MD   REFERRING PHYSICIAN: Alphonsa Overall, MD  DIAGNOSIS:    ICD-9-CM ICD-10-CM   1. Malignant neoplasm of upper-inner quadrant of left breast in female, estrogen receptor positive ([place]) 174.2 C50.212    V86.0 Z17.0    Clinical Stage IA (T1cN0M0) Left Breast UIQ Invasive Ductal Carcinoma, ER+ / PR+ / Her2neg, Grade 3  CHIEF COMPLAINT: Here to discuss management of left breast cancer  HISTORY OF PRESENT ILLNESS::Abigail Yoder is a 51 y.o. female who presented with an abnormal left breast screening mammogram. The left breast showed a medial mass. Ultrasound on 11/16/16 showed a 1.2 cm mass at the 10:00 position and the left axilla was negative. Biopsy revealed grade 3 invasive cuctal carcinoma (ER+, PR+, HER2-).  The patient presents today in multidisciplinary breast clinic with her husband and mother to discuss radiation in the management of her disease.  The patient reports she wears glasses, hematuria, diabetes, and anemia.  The patient reports that she has been afraid of mammograms since she had a "scare" about 5 years ago. She did not return to have screening mammograms until recently.  The patient reports seeing a PA in December for her physical. Urinalysis showed that she had blood in her urine and she was subsequently treated for a UTI and has been referred for a urologist due to persistent hematuria with anemia.  PREVIOUS RADIATION THERAPY: No  PAST MEDICAL HISTORY:  has a past medical history of Anemia; Diabetes mellitus; Headache(784.0); Hypertension; and Obesity.    PAST SURGICAL HISTORY: Past Surgical History:  Procedure Laterality Date  . APPENDECTOMY    . CESAREAN SECTION    . TUBAL LIGATION      FAMILY HISTORY: family history includes Arthritis in  her mother; Breast cancer (age of onset: 31) in her sister; Diabetes in her mother; Heart disease in her father; Hyperlipidemia in her father; Hypertension in her father.  SOCIAL HISTORY:  reports that she quit smoking about 22 years ago. She does not have any smokeless tobacco history on file. She reports that she does not drink alcohol or use drugs.  ALLERGIES: Patient has no known allergies.  MEDICATIONS:  Current Outpatient Prescriptions  Medication Sig Dispense Refill  . amLODipine (NORVASC) 5 MG tablet Take 5 mg by mouth daily.    . Insulin Glargine-Lixisenatide (SOLIQUA) 100-33 UNT-MCG/ML SOPN Inject 30 Units into the skin 2 (two) times daily.    Marland Kitchen losartan-hydrochlorothiazide (HYZAAR) 100-25 MG tablet Take 1 tablet by mouth daily.    . metFORMIN (GLUCOPHAGE) 1000 MG tablet Take 1,000 mg by mouth 2 (two) times daily with a meal.     No current facility-administered medications for this encounter.     REVIEW OF SYSTEMS: A 10+ POINT REVIEW OF SYSTEMS WAS OBTAINED including neurology, dermatology, psychiatry, cardiac, respiratory, lymph, heme, extremities, GI, GU, Musculoskeletal, constitutional, breasts, reproductive, HEENT.  All pertinent positives are noted in the HPI.  All others are negative.   PHYSICAL EXAM:  Vitals with BMI 11/28/2016  Height 5' 3.25"  Weight 222 lbs 13 oz  BMI 02.4  Systolic 097  Diastolic 83  Pulse 353  Respirations 20   General: Alert and oriented, in no acute distress HEENT: Head is normocephalic. Extraocular movements are intact. Oropharynx is clear. Neck: Neck is supple, no palpable cervical  or supraclavicular lymphadenopathy. Heart: Regular in rate and rhythm with no murmurs, rubs, or gallops. Chest: Clear to auscultation bilaterally, with no rhonchi, wheezes, or rales. Abdomen: Soft, nontender, nondistended, with no rigidity or guarding. Extremities: No cyanosis or edema. Lymphatics: see Neck Exam Skin: No concerning lesions. Musculoskeletal:  symmetric strength and muscle tone throughout. Neurologic: Cranial nerves II through XII are grossly intact. No obvious focalities. Speech is fluent. Coordination is intact. Psychiatric: Judgment and insight are intact. Affect is appropriate. Breasts: Left breast notable for a 2.5 cm mass at 11:00, may partially be biopsy artifact. No other palpable masses in the remaining breast tissue or axillae bilaterally.  ECOG = 0  0 - Asymptomatic (Fully active, able to carry on all predisease activities without restriction)  1 - Symptomatic but completely ambulatory (Restricted in physically strenuous activity but ambulatory and able to carry out work of a light or sedentary nature. For example, light housework, office work)  2 - Symptomatic, <50% in bed during the day (Ambulatory and capable of all self care but unable to carry out any work activities. Up and about more than 50% of waking hours)  3 - Symptomatic, >50% in bed, but not bedbound (Capable of only limited self-care, confined to bed or chair 50% or more of waking hours)  4 - Bedbound (Completely disabled. Cannot carry on any self-care. Totally confined to bed or chair)  5 - Death   Eustace Pen MM, Creech RH, Tormey DC, et al. [phone]). "Toxicity and response criteria of the [facility]". [place] Oncol. 5 (6): 649-55   LABORATORY DATA:  Lab Results  Component Value Date   WBC 9.4 11/28/2016   HGB 10.9 (L) 11/28/2016   HCT 33.2 (L) 11/28/2016   MCV 90.5 11/28/2016   PLT 256 11/28/2016   CMP     Component Value Date/Time   NA 141 11/28/2016 0827   K 4.2 11/28/2016 0827   CL 108 09/03/2014 1014   CO2 24 11/28/2016 0827   GLUCOSE 144 (H) 11/28/2016 0827   GLUCOSE 333 (H) 09/05/2006 1306   BUN 28.9 (H) 11/28/2016 0827   CREATININE 1.3 (H) 11/28/2016 0827   CALCIUM 9.8 11/28/2016 0827   PROT 8.3 11/28/2016 0827   ALBUMIN 3.4 (L) 11/28/2016 0827   AST 16 11/28/2016 0827   ALT 16 11/28/2016 0827   ALKPHOS  133 11/28/2016 0827   BILITOT 0.22 11/28/2016 0827   GFRNONAA 100.30 12/05/2009 1629   GFRAA 102 12/05/2007 1444         RADIOGRAPHY:  as above - imaging reviewed at tumor board.    IMPRESSION/PLAN: Left breast cancer, Stage IA  She has been discussed at our multidisciplinary tumor board. The consensus is that she would be a good candidate for breast conservation if genetics came back as negative. I expect genetic testing will be negative as her only family history of breast cancer is only in 1 of 5 sisters.  I talked to her about the option of a mastectomy and informed her that her expected overall survival would be equivalent between mastectomy and breast conservation, based upon randomized controlled data. She is enthusiastic about breast conservation.   It was a pleasure meeting the patient today. We discussed the risks, benefits, and side effects of radiotherapy. I recommend radiotherapy to the left breast to reduce her risk of locoregional recurrence by 2/3.  We discussed that radiation would take approximately 6-7 weeks to complete and that I would give the patient a few  weeks to heal following surgery before starting treatment planning. If chemotherapy were to be given, this would precede radiotherapy. We spoke about acute effects including skin irritation and fatigue as well as much less common late effects including internal organ injury or irritation. We spoke about the latest technology that is used to minimize the risk of late effects for patients undergoing radiotherapy to the breast or chest wall. No guarantees of treatment were given. The patient is enthusiastic about proceeding with treatment. I look forward to participating in the patient's care.  I will await her referral back to me for postoperative follow-up and eventual CT simulation/treatment planning.  The breast navigators will arrange a genetics consultation in light of her family history.  Followup with PCP re:  hypertension, asymptomatic.  Urology scheduled for urinary issues - hematuria.  __________________________________________   Eppie Gibson, MD  This document serves as a record of services personally performed by Eppie Gibson, MD. It was created on her behalf by Darcus Austin, a trained medical scribe. The creation of this record is based on the scribe's personal observations and the provider's statements to them. This document has been checked and approved by the attending provider.

## 2016-11-28 NOTE — Patient Instructions (Signed)

## 2016-11-29 ENCOUNTER — Ambulatory Visit (HOSPITAL_BASED_OUTPATIENT_CLINIC_OR_DEPARTMENT_OTHER): Payer: BLUE CROSS/BLUE SHIELD | Admitting: Genetic Counselor

## 2016-11-29 ENCOUNTER — Encounter: Payer: Self-pay | Admitting: Genetic Counselor

## 2016-11-29 ENCOUNTER — Other Ambulatory Visit: Payer: BLUE CROSS/BLUE SHIELD

## 2016-11-29 ENCOUNTER — Other Ambulatory Visit: Payer: Self-pay | Admitting: *Deleted

## 2016-11-29 ENCOUNTER — Telehealth: Payer: Self-pay | Admitting: *Deleted

## 2016-11-29 DIAGNOSIS — C50212 Malignant neoplasm of upper-inner quadrant of left female breast: Secondary | ICD-10-CM | POA: Diagnosis not present

## 2016-11-29 DIAGNOSIS — Z315 Encounter for genetic counseling: Secondary | ICD-10-CM

## 2016-11-29 DIAGNOSIS — Z170 Estrogen receptor positive status [ER+]: Principal | ICD-10-CM

## 2016-11-29 DIAGNOSIS — Z803 Family history of malignant neoplasm of breast: Secondary | ICD-10-CM | POA: Diagnosis not present

## 2016-11-29 NOTE — Telephone Encounter (Signed)
Message left by pt inquiring about prescription for tamoxifen to be ordered by Dr Jana Hakim " now that I have had the genetic testing done "  Noted pt has not been seen by MD to be established as a patient - appointment is scheduled for 12/14/2016.  This note will be forwarded to [facility] navigators for follow up.

## 2016-11-29 NOTE — Progress Notes (Signed)
REFERRING PROVIDER: [name], MD [address] [place], [place] [address]   Abigail Del, MD  PRIMARY PROVIDER:  [name], MD  PRIMARY REASON FOR VISIT:  1. Malignant neoplasm of upper-inner quadrant of left breast in female, estrogen receptor positive ([place])   2. Family history of breast cancer      HISTORY OF PRESENT ILLNESS:   Abigail Yoder, a 51 y.o. female, was seen for a [place] cancer genetics consultation at the request of Dr. Jana Yoder due to a personal and family history of cancer.  Abigail Yoder presents to clinic today to discuss the possibility of a hereditary predisposition to cancer, genetic testing, and to further clarify her future cancer risks, as well as potential cancer risks for family members.   In January 2018, at the age of 43, Abigail Yoder was diagnosed with invasive ductal carcinoma of the left breast. This will be treated with lumpectomy and radiation, unless oncotype comes back showing a need for chemotherapy.     CANCER HISTORY:    Malignant neoplasm of upper-inner quadrant of left breast in female, estrogen receptor positive ([place])   11/27/2016 Initial Diagnosis    Malignant neoplasm of upper-inner quadrant of left breast in female, estrogen receptor positive ([place])      Genetic Testing    Patient has genetic testing done for personal and family history of breast cancer. Genetic testing is pending        HORMONAL RISK FACTORS:  Menarche was at age 14.  First live birth at age 24.  OCP use for approximately 5-7 years.  Ovaries intact: yes.  Hysterectomy: no.  Menopausal status: perimenopausal.  HRT use: 0 years. Colonoscopy: no; not examined. Mammogram within the last year: yes. Number of breast biopsies: 1. Up to date with pelvic exams:  no. Any excessive radiation exposure in the past:  no  Past Medical History:  Diagnosis Date  . Anemia   . Diabetes mellitus   . Family history of breast cancer   . Headache(784.0)   .  Hypertension   . Obesity     Past Surgical History:  Procedure Laterality Date  . APPENDECTOMY    . CESAREAN SECTION    . TUBAL LIGATION      Social History   Social History  . Marital status: Married    Spouse name: N/A  . Number of children: 2  . Years of education: N/A   Social History Main Topics  . Smoking status: Former Smoker    Quit date: 11/19/1994  . Smokeless tobacco: Never Used  . Alcohol use No  . Drug use: No  . Sexual activity: Yes   Other Topics Concern  . None   Social History Narrative  . None     FAMILY HISTORY:  We obtained a detailed, 4-generation family history.  Significant diagnoses are listed below: Family History  Problem Relation Age of Onset  . Diabetes Mother   . Arthritis Mother   . Hypertension Father   . Heart disease Father   . Hyperlipidemia Father   . Breast cancer Sister 52    came back 17 years later  . Stroke Maternal Uncle   . Stroke Maternal Grandmother     The patient has two children who are cancer free. She reports not knowing much about her family. She has fives sisters and four brothers.  Three brothers are deceased from other things than cancer.  One sister developed breast cancer at 48 and then again at  44.    The patient's parents are both deceased.  Her mother had a full brother and a paternal half brother who are both deceased.  There is no other reported cancer history in the maternal family.    The patient's father died of a brain aneurysm.  He was an only child.  There is no other reported family history of cancer.  [name] is unaware of previous family history of genetic testing for hereditary cancer risks. Patient's maternal ancestors are of African American descent, and paternal ancestors are of African American descent. There is no reported Ashkenazi Jewish ancestry. There is no known consanguinity.  GENETIC COUNSELING ASSESSMENT: Abigail Yoder is a 51 y.o. female with a personal and family history of  breast cancer which is somewhat suggestive of a hereditary cancer syndrome and predisposition to cancer. We, therefore, discussed and recommended the following at today's visit.   DISCUSSION: We discussed that about 5-10% of breast cancer is hereditary with most cases due to BRCA mutations.  Other genes associated with hereditary breast cancer syndromes include ATM, CHEK2 and PALB2.  We reviewed the characteristics, features and inheritance patterns of hereditary cancer syndromes. We also discussed genetic testing, including the appropriate family members to test, the process of testing, insurance coverage and turn-around-time for results. We discussed the implications of a negative, positive and/or variant of uncertain significant result. We recommended Abigail Yoder pursue genetic testing for the [facility] panel gene panel. The Breast/GYN gene panel offered by [facility] includes sequencing and rearrangement analysis for the following 23 genes:  ATM, BARD1, BRCA1, BRCA2, BRIP1, CDH1, CHEK2, EPCAM, FANCC, MLH1, MSH2, MSH6, MUTYH, NBN, NF1, PALB2, PMS2, POLD1, PTEN, RAD51C, RAD51D, RECQL, and TP53.     Based on Abigail Yoder's personal and family history of cancer, she meets medical criteria for genetic testing. Despite that she meets criteria, she may still have an out of pocket cost. We discussed that if her out of pocket cost for testing is over $100, the laboratory will call and confirm whether she wants to proceed with testing.  If the out of pocket cost of testing is less than $100 she will be billed by the genetic testing laboratory.   PLAN: After considering the risks, benefits, and limitations, Abigail Yoder  provided informed consent to pursue genetic testing and the blood sample was sent to [facility] for analysis of the [facility] panel. Results should be available within approximately 2-3 weeks' time, at which point they will be disclosed by telephone to Abigail Yoder, as will any additional  recommendations warranted by these results. Abigail Yoder will receive a summary of her genetic counseling visit and a copy of her results once available. This information will also be available in Epic. We encouraged Abigail Yoder to remain in contact with cancer genetics annually so that we can continuously update the family history and inform her of any changes in cancer genetics and testing that may be of benefit for her family. Abigail Yoder questions were answered to her satisfaction today. Our contact information was provided should additional questions or concerns arise.  Lastly, we encouraged Abigail Yoder to remain in contact with cancer genetics annually so that we can continuously update the family history and inform her of any changes in cancer genetics and testing that may be of benefit for this family.   Ms.  Yoder questions were answered to her satisfaction today. Our contact information was provided should additional questions or concerns arise. Thank you for the referral and  allowing [place] to share in the care of your patient.   Karen P. Florene Glen, [place], [facility] Certified Genetic Counselor [email or web address] phone: [phone]  The patient was seen for a total of 60 minutes in face-to-face genetic counseling.  This patient was discussed with Drs. [name], Lindi Adie and/or Burr Medico who agrees with the above.    _______________________________________________________________________ For Office Staff:  Number of people involved in session: 2 Was an Intern/ student involved with case: no

## 2016-11-30 ENCOUNTER — Other Ambulatory Visit: Payer: Self-pay | Admitting: *Deleted

## 2016-11-30 MED ORDER — TAMOXIFEN CITRATE 20 MG PO TABS: 20.0000 mg | ORAL_TABLET | Freq: Every day | ORAL | 1 refills | Status: DC

## 2016-11-30 NOTE — Progress Notes (Signed)
[facility]  Telephone:([phone]) [phone] Fax:(336) [phone]     ID: Abigail Yoder DOB: 05-28-1966  MR#: [id number]  TKZ#:[id number]  Patient Care Team: [name], MD as PCP - General (Internal Medicine) Alphonsa Overall, MD as Consulting Physician (General Surgery) Chauncey Cruel, MD as Consulting Physician (Oncology) Eppie Gibson, MD as Attending Physician (Radiation Oncology) Chauncey Cruel, MD OTHER MD:  CHIEF COMPLAINT: Estrogen receptor positive breast cancer  CURRENT TREATMENT: Likely neoadjuvant chemotherapy   BREAST CANCER HISTORY: Abigail Yoder had bilateral routine screening mammography with tomography at [facility] 11/14/2016. The breast density was category B. In the left breast upper inner quadrant there was a 1.5 cm high density mass which was further evaluated with ultrasonography 11/16/2016. This confirmed a 1.2 cm lobulated mass in the left breast upper inner quadrant. The left axilla was benign.  Biopsy of the left breast mass in question 11/21/2016 showed (SAA [id number]) invasive ductal carcinoma, grade 3, estrogen and progesterone receptor positive, HER-2 nonamplified, with a signals ratio of 1.05 and the number per cell 2.15, with an MIB-1 of 90%.  Her subsequent history is as detailed below  INTERVAL HISTORY: Abigail Yoder was evaluated in the multidisciplinary breast cancer conference 11/28/2016 accompanied by her husband Abigail Yoder, her "adopted mother" and her minister. Her case was also presented in the multidisciplinary breast cancer conference that same morning. At that time a preliminary plan was proposed: Oncotype to be obtained from the biopsy, likely to need chemotherapy, genetics testing, breast conserving surgery with sentinel lymph node sampling, adjuvant radiation, and hormone treatment.  REVIEW OF SYSTEMS: There were no specific symptoms leading to the original mammogram, which was routinely scheduled. The patient denies unusual headaches, visual changes, nausea,  vomiting, stiff neck, dizziness, or gait imbalance. There has been no cough, phlegm production, or pleurisy, no chest pain or pressure, and no change in bowel or bladder habits. The patient denies fever, rash, bleeding, unexplained fatigue or unexplained weight loss. She has a history of hematuria evaluated by urology. Her diabetes is well-controlled. A detailed review of systems was otherwise entirely negative.  PAST MEDICAL HISTORY: Past Medical History:  Diagnosis Date  . Anemia   . Diabetes mellitus   . Family history of breast cancer   . Headache(784.0)   . Hypertension   . Obesity     PAST SURGICAL HISTORY: Past Surgical History:  Procedure Laterality Date  . APPENDECTOMY    . CESAREAN SECTION    . TUBAL LIGATION      FAMILY HISTORY Family History  Problem Relation Age of Onset  . Diabetes Mother   . Arthritis Mother   . Hypertension Father   . Heart disease Father   . Hyperlipidemia Father   . Breast cancer Sister 61    came back 17 years later  . Stroke Maternal Uncle   . Stroke Maternal Grandmother   The patient's father died at the age of 53 from a brain aneurysm. The patient's mother died at age 38 following total hip replacement. The patient had 4 brothers, 5 sisters. One sister was diagnosed with breast cancer at age 24. There is no history of ovarian cancer in the family.  GYNECOLOGIC HISTORY:  No LMP recorded. Menarche age 24, first live birth age 54, the patient is [name] P2. She is perimenopausal and her most recent period was October 2017. She never used oral contraceptives.  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 Her husband Abigail Yoder works in  a warehouse. Daughter Abigail Yoder works for the [employer] in [place]. Son show more right works for the Glass blower/designer at [employer] in [place]. The patient has 2 grandchildren. She attends a local [place]: Not in place   HEALTH MAINTENANCE: Social History  Substance Use Topics  . Smoking status: Former Smoker    Quit date: 11/19/1994  . Smokeless tobacco: Never Used  . Alcohol use No     Colonoscopy:Never  PAP:2015  Bone density:Never   No Known Allergies  Current Outpatient Prescriptions  Medication Sig Dispense Refill  . amLODipine (NORVASC) 5 MG tablet Take 5 mg by mouth daily.    . Insulin Glargine-Lixisenatide (SOLIQUA) 100-33 UNT-MCG/ML SOPN Inject 30 Units into the skin 2 (two) times daily.    Marland Kitchen losartan-hydrochlorothiazide (HYZAAR) 100-25 MG tablet Take 1 tablet by mouth daily.    . metFORMIN (GLUCOPHAGE) 1000 MG tablet Take 1,000 mg by mouth 2 (two) times daily with a meal.     No current facility-administered medications for this visit.     OBJECTIVE: Middle-aged African-American woman who appears well  Vitals:   11/28/16 0852  BP: (!) 184/83  Pulse: (!) 111  Resp: 20  Temp: 98.3 F (36.8 C)     Body mass index is 39.16 kg/m.    ECOG FS:0 - Asymptomatic  Ocular: Sclerae unicteric, pupils equal, round and reactive to light Ear-nose-throat: Oropharynx clear and moist Lymphatic: No cervical or supraclavicular adenopathy Lungs no rales or rhonchi, good excursion bilaterally Heart regular rate and rhythm, no murmur appreciated Abd soft, nontender, positive bowel sounds MSK no focal spinal tenderness, no joint edema Neuro: non-focal, well-oriented, appropriate affect Breasts: The right breast is unremarkable. The left breast is status post recent biopsy. I do not palpate a suspicious mass. There are no skin or nipple changes of concern. The left axilla is benign.   LAB RESULTS:  CMP     Component Value Date/Time   NA 141 11/28/2016 0827   K 4.2 11/28/2016 0827   CL 108 09/03/2014 1014   CO2 24 11/28/2016 0827   GLUCOSE 144 (H) 11/28/2016 0827   GLUCOSE 333 (H) 09/05/2006 1306   BUN 28.9 (H) 11/28/2016 0827    CREATININE 1.3 (H) 11/28/2016 0827   CALCIUM 9.8 11/28/2016 0827   PROT 8.3 11/28/2016 0827   ALBUMIN 3.4 (L) 11/28/2016 0827   AST 16 11/28/2016 0827   ALT 16 11/28/2016 0827   ALKPHOS 133 11/28/2016 0827   BILITOT 0.22 11/28/2016 0827   GFRNONAA 100.30 12/05/2009 1629   GFRAA 102 12/05/2007 1444    INo results found for: SPEP, UPEP  Lab Results  Component Value Date   WBC 9.4 11/28/2016   NEUTROABS 7.2 (H) 11/28/2016   HGB 10.9 (L) 11/28/2016   HCT 33.2 (L) 11/28/2016   MCV 90.5 11/28/2016   PLT 256 11/28/2016      Chemistry      Component Value Date/Time   NA 141 11/28/2016 0827   K 4.2 11/28/2016 0827   CL 108 09/03/2014 1014   CO2 24 11/28/2016 0827   BUN 28.9 (H) 11/28/2016 0827   CREATININE 1.3 (H) 11/28/2016 0827      Component Value Date/Time   CALCIUM 9.8 11/28/2016 0827   ALKPHOS 133 11/28/2016 0827   AST 16 11/28/2016 0827   ALT 16 11/28/2016 0827   BILITOT 0.22 11/28/2016 0827       No results  found for: LABCA2  No components found for: QASTM196  No results for input(s): INR in the last 168 hours.  Urinalysis    Component Value Date/Time   BILIRUBINUR n 06/09/2014 1651   PROTEINUR 1+ 06/09/2014 1651   UROBILINOGEN 0.2 06/09/2014 1651   NITRITE positive 06/09/2014 1651   LEUKOCYTESUR small (1+) 06/09/2014 1651     STUDIES: No results found.  ELIGIBLE FOR AVAILABLE RESEARCH PROTOCOL: No   ASSESSMENT: 51 y.o. [place] woman status post left breast upper inner quadrant biopsy 11/21/2016 for a clinical T1 cN0, stage IA invasive ductal carcinoma, grade 3, estrogen and progesterone receptor positive, HER-2 not amplified, with an MIB-1 of 90%  (1) Oncotype DX results pending: Patient is likely to need chemotherapy  (2) genetics testing sent 11/28/2016, results pending  (3) definitive surgery to follow  (4) adjuvant radiation to follow as appropriate.  PLAN: We spent the better part of today's hour-long appointment discussing the  biology of breast cancer in general, and the specifics of the patient's tumor in particular. We first reviewed the fact that cancer is not one disease but more than 100 different diseases and that it is important to keep them separate-- otherwise when friends and relatives discuss their own cancer experiences with Jnya confusion can result. Similarly we explained that if breast cancer spreads to the bone or liver, the patient would not have bone cancer or liver cancer, but breast cancer in the bone and breast cancer in the liver: one cancer in three places-- not 3 different cancers which otherwise would have to be treated in 3 different ways.  We discussed the difference between local and systemic therapy. In terms of loco-regional treatment, lumpectomy plus radiation is equivalent to mastectomy as far as survival is concerned. For this reason, and because the cosmetic results are generally superior, we recommend breast conserving surgery. We also noted that in terms of sequencing of treatments, whether systemic therapy or surgery is done first does not affect the ultimate outcome. This is relevant to [place]ia's situation.  We then discussed the rationale for systemic therapy. There is some risk that this cancer may have already spread to other parts of her body. Patients frequently ask at this point about bone scans, CAT scans and PET scans to find out if they have occult breast cancer somewhere else. The problem is that in early stage disease we are much more likely to find false positives then true cancers and this would expose the patient to unnecessary procedures as well as unnecessary radiation. Scans cannot answer the question the patient really would like to know, which is whether she has microscopic disease elsewhere in her body. For those reasons we do not recommend them.  Of course we would proceed to aggressive evaluation of any symptoms that might suggest metastatic disease, but that is not the case  here.  Next we went over the options for systemic therapy which are anti-estrogens, anti-HER-2 immunotherapy, and chemotherapy. Jaidee does not meet criteria for anti-HER-2 immunotherapy. She is a good candidate for anti-estrogens.  The question of chemotherapy is more complicated. Chemotherapy is most effective in rapidly growing, aggressive tumors  like Tobin 's. For that reason I anticipate chemotherapy will be part of her standard treatment. To confirm this we are going to request an Oncotype from the recent biopsy sample. She will see me in approximately 2 weeks to discuss those results.  Nima  has a good understanding of the overall plan. She agrees with it. She knows the  goal of treatment in her case is cure. She will call with any problems that may develop before her next visit here.  Chauncey Cruel, MD   11/30/2016 8:00 AM Medical Oncology and Hematology [facility] [address] [place], [place] [address] Tel. [phone]    Fax. [phone]

## 2016-11-30 NOTE — Progress Notes (Signed)
[facility] Psychosocial Distress Screening [place] was seen in [facility] on 11/28/16.  Distress screen reviewed per protocol.  The patient scored a [pt did not supply] on the Psychosocial Distress Thermometer which indicates [unspecified] distress. Attempted to reach pt by phone to assess for distress and other psychosocial needs; left VM with encouragement to return call.   ONCBCN DISTRESS SCREENING 11/30/2016  Screening Type Initial Screening  Emotional problem type Nervousness/Anxiety  Referral to support programs Yes    Follow up needed: Yes.  Plan to f/u by phone if pt does not return call. Please also page if immediate needs arise.  Thank you.   [place], [place], [facility] Pager [phone] Voicemail [phone]

## 2016-12-03 ENCOUNTER — Telehealth: Payer: Self-pay | Admitting: *Deleted

## 2016-12-03 ENCOUNTER — Other Ambulatory Visit: Payer: Self-pay | Admitting: *Deleted

## 2016-12-03 MED ORDER — TAMOXIFEN CITRATE 20 MG PO TABS: 20.0000 mg | ORAL_TABLET | Freq: Every day | ORAL | 1 refills | Status: DC

## 2016-12-03 NOTE — Telephone Encounter (Signed)
Left vm regarding [facility] from 11/29/16. Contact information provided.

## 2016-12-03 NOTE — Telephone Encounter (Signed)
Pt denies questions or concerns regarding dx or treatment care plan. Encourage pt to call with needs. Received verbal understanding. Prescription for tamoxifen sent to [facility] on [address].

## 2016-12-06 ENCOUNTER — Encounter: Payer: Self-pay | Admitting: General Practice

## 2016-12-06 NOTE — Progress Notes (Signed)
[facility] Spiritual Care Note  Reached Abigail Yoder by phone to f/u about [facility] team/resources.  She was in good spirits, reporting great support from Radio broadcast assistant ([place]), family (dtr and 51yo granddtr are local and in touch regularly), and a coworker who recently finished tx herself.  Per pt, faith and prayer are key parts of her coping and meaning-making.  She is practicing regular Bible reading and spiritual meditation to deepen her relationship with God through her cancer experience.   Abigail Yoder is aware of ongoing Support Team availability and plans to take advantage of programs such as massage, but please also page if immediate needs arise. Thank you.   [place], [place], [facility] Pager [phone] Voicemail [phone]

## 2016-12-10 ENCOUNTER — Other Ambulatory Visit: Payer: Self-pay | Admitting: Oncology

## 2016-12-14 ENCOUNTER — Encounter: Payer: Self-pay | Admitting: *Deleted

## 2016-12-14 ENCOUNTER — Ambulatory Visit (HOSPITAL_BASED_OUTPATIENT_CLINIC_OR_DEPARTMENT_OTHER): Payer: BLUE CROSS/BLUE SHIELD | Admitting: Oncology

## 2016-12-14 VITALS — BP 187/77 | HR 91 | Temp 98.0°F | Resp 17 | Ht 63.25 in | Wt 222.6 lb

## 2016-12-14 DIAGNOSIS — Z170 Estrogen receptor positive status [ER+]: Secondary | ICD-10-CM | POA: Diagnosis not present

## 2016-12-14 DIAGNOSIS — C50212 Malignant neoplasm of upper-inner quadrant of left female breast: Secondary | ICD-10-CM

## 2016-12-14 MED ORDER — LIDOCAINE-PRILOCAINE 2.5-2.5 % EX CREA: TOPICAL_CREAM | CUTANEOUS | 3 refills | Status: DC

## 2016-12-14 MED ORDER — LORAZEPAM 0.5 MG PO TABS: 0.5000 mg | ORAL_TABLET | Freq: Four times a day (QID) | ORAL | 0 refills | Status: DC | PRN

## 2016-12-14 MED ORDER — PROCHLORPERAZINE MALEATE 10 MG PO TABS: 10.0000 mg | ORAL_TABLET | Freq: Four times a day (QID) | ORAL | 1 refills | Status: DC | PRN

## 2016-12-14 MED ORDER — DEXAMETHASONE 4 MG PO TABS: ORAL_TABLET | ORAL | 1 refills | Status: DC

## 2016-12-14 NOTE — Progress Notes (Signed)
[place]  [phone] Fax:(336) [phone]     ID: Abigail Yoder DOB: [birth year]  MR#: [id number]  ZES#:[id number]  Patient Care Team: [name], MD as PCP - General (Internal Medicine) Alphonsa Overall, MD as Consulting Physician (General Surgery) Chauncey Cruel, MD as Consulting Physician (Oncology) Eppie Gibson, MD as Attending Physician (Radiation Oncology) Jacelyn Pi, MD as Consulting Physician (Endocrinology) Ardis Hughs, MD as Attending Physician (Urology) Chauncey Cruel, MD OTHER MD:  CHIEF COMPLAINT: Estrogen receptor positive breast cancer  CURRENT TREATMENT: neoadjuvant chemotherapy   BREAST CANCER HISTORY: From the original intake note:  Abigail Yoder had bilateral routine screening mammography with tomography at [facility] 11/14/2016. The breast density was category B. In the left breast upper inner quadrant there was a 1.5 cm high density mass which was further evaluated with ultrasonography 11/16/2016. This confirmed a 1.2 cm lobulated mass in the left breast upper inner quadrant. The left axilla was benign.  Biopsy of the left breast mass in question 11/21/2016 showed (SAA [id number]) invasive ductal carcinoma, grade 3, estrogen and progesterone receptor positive, HER-2 nonamplified, with a signals ratio of 1.05 and the number per cell 2.15, with an MIB-1 of 90%.  Her subsequent history is as detailed below  INTERVAL HISTORY: Abigail Yoder returns today for follow-up of her estrogen receptor positive breast cancer accompanied by her mother. Since the last visit here we receive the results of her Oncotype DX, which show a score of 60. This tells [place] that she has a 10 year risk of recurrence outside the breast of at least 34% if her only systemic therapy is tamoxifen for 5 years. It predicts a significant benefit from chemotherapy and in fact on this study the NSABP B 20 data shows patients like her had a distant recurrence rate in the 12% range if they do  chemotherapy in addition to anti-estrogens.  Accordingly she is here today to arrange for neoadjuvant chemotherapy.  REVIEW OF SYSTEMS: She is understandably anxious but otherwise she is doing well. She has renewed her gym membership although she has not just started ago. She is hoping to be able to continue to work right through treatment. She brought her FMLA papers for [place] to help her complete today. She received a note from [facility] that her genetics testing has been denied and I am making that available to our genetics counselor to get that problem resolved. She has also been started on NovoLog to bring your sugars under better control. Aside from these issues a detailed review of systems today was stable.  PAST MEDICAL HISTORY: Past Medical History:  Diagnosis Date  . Anemia   . Diabetes mellitus   . Family history of breast cancer   . Headache(784.0)   . Hypertension   . Obesity     PAST SURGICAL HISTORY: Past Surgical History:  Procedure Laterality Date  . APPENDECTOMY    . CESAREAN SECTION    . TUBAL LIGATION      FAMILY HISTORY Family History  Problem Relation Age of Onset  . Diabetes Mother   . Arthritis Mother   . Hypertension Father   . Heart disease Father   . Hyperlipidemia Father   . Breast cancer Sister 35    came back 17 years later  . Stroke Maternal Uncle   . Stroke Maternal Grandmother   The patient's father died at the age of 56 from a brain aneurysm. The patient's mother died at age 81 following total hip replacement. The patient  had 4 brothers, 5 sisters. One sister was diagnosed with breast cancer at age 53. There is no history of ovarian cancer in the family.  GYNECOLOGIC HISTORY:  No LMP recorded. Menarche age 74, first live birth age 89, the patient is [place] P2. She is perimenopausal and her most recent period was October 2017. She never used oral contraceptives.  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 Her husband [name] works in a Proofreader. Daughter [name] works for the [employer] in [place]. Son show more right works for the Glass blower/designer at [employer] in [place]. The patient has 2 grandchildren. She attends a local [place]: Not in place   HEALTH MAINTENANCE: Social History  Substance Use Topics  . Smoking status: Former Smoker    Quit date: 11/19/1994  . Smokeless tobacco: Never Used  . Alcohol use No     Colonoscopy:Never  PAP:2015  Bone density:Never   No Known Allergies  Current Outpatient Prescriptions  Medication Sig Dispense Refill  . amLODipine (NORVASC) 5 MG tablet Take 5 mg by mouth daily.    Marland Kitchen dexamethasone (DECADRON) 4 MG tablet Take 2 tablets by mouth once a day on the day after chemotherapy and then take 2 tablets two times a day for 2 days. Take with food. 30 tablet 1  . insulin aspart (NOVOLOG PENFILL) cartridge 15 units sQ at breakfast, 10 units at lunch, 15 units at dinner 15 mL 11  . lidocaine-prilocaine (EMLA) cream Apply to affected area once 30 g 3  . LORazepam (ATIVAN) 0.5 MG tablet Take 1 tablet (0.5 mg total) by mouth every 6 (six) hours as needed (Nausea or vomiting). 30 tablet 0  . losartan-hydrochlorothiazide (HYZAAR) 100-25 MG tablet Take 1 tablet by mouth daily.    . metFORMIN (GLUCOPHAGE) 1000 MG tablet Take 1,000 mg by mouth 2 (two) times daily with a meal.    . prochlorperazine (COMPAZINE) 10 MG tablet Take 1 tablet (10 mg total) by mouth every 6 (six) hours as needed (Nausea or vomiting). 30 tablet 1   No current facility-administered medications for this visit.     OBJECTIVE: Middle-aged African-American woman In no acute distress Vitals:   12/14/16 0910  BP: (!) 187/77  Pulse: 91  Resp: 17  Temp: 98 F (36.7 C)     Body mass index is 39.12 kg/m.    ECOG FS:1 - Symptomatic but completely  ambulatory  Sclerae unicteric, EOMs intact Oropharynx clear and moist No cervical or supraclavicular adenopathy Lungs no rales or rhonchi Heart regular rate and rhythm Abd soft, nontender, positive bowel sounds MSK no focal spinal tenderness, no upper extremity lymphedema Neuro: nonfocal, well oriented, appropriate affect Breasts: I do not palpate a mass in either breast. Both axillae are benign.  LAB RESULTS:  CMP     Component Value Date/Time   NA 141 11/28/2016 0827   K 4.2 11/28/2016 0827   CL 108 09/03/2014 1014   CO2 24 11/28/2016 0827   GLUCOSE 144 (H) 11/28/2016 0827   GLUCOSE 333 (H) 09/05/2006 1306   BUN 28.9 (H) 11/28/2016 0827   CREATININE 1.3 (H) 11/28/2016 0827   CALCIUM 9.8 11/28/2016 0827   PROT 8.3 11/28/2016 0827   ALBUMIN 3.4 (L) 11/28/2016 0827   AST 16 11/28/2016 0827   ALT 16 11/28/2016 0827   ALKPHOS 133 11/28/2016 0827   BILITOT 0.22 11/28/2016  0827   GFRNONAA 100.30 12/05/2009 1629   GFRAA 102 12/05/2007 1444    INo results found for: SPEP, UPEP  Lab Results  Component Value Date   WBC 9.4 11/28/2016   NEUTROABS 7.2 (H) 11/28/2016   HGB 10.9 (L) 11/28/2016   HCT 33.2 (L) 11/28/2016   MCV 90.5 11/28/2016   PLT 256 11/28/2016      Chemistry      Component Value Date/Time   NA 141 11/28/2016 0827   K 4.2 11/28/2016 0827   CL 108 09/03/2014 1014   CO2 24 11/28/2016 0827   BUN 28.9 (H) 11/28/2016 0827   CREATININE 1.3 (H) 11/28/2016 0827      Component Value Date/Time   CALCIUM 9.8 11/28/2016 0827   ALKPHOS 133 11/28/2016 0827   AST 16 11/28/2016 0827   ALT 16 11/28/2016 0827   BILITOT 0.22 11/28/2016 0827       No results found for: LABCA2  No components found for: LABCA125  No results for input(s): INR in the last 168 hours.  Urinalysis    Component Value Date/Time   BILIRUBINUR n 06/09/2014 1651   PROTEINUR 1+ 06/09/2014 1651   UROBILINOGEN 0.2 06/09/2014 1651   NITRITE positive 06/09/2014 1651   LEUKOCYTESUR  small (1+) 06/09/2014 1651     STUDIES: No results found.  ELIGIBLE FOR AVAILABLE RESEARCH PROTOCOL: No   ASSESSMENT: 51 y.o. [place] woman status post left breast upper inner quadrant biopsy 11/21/2016 for a clinical T1 cN0, stage IA invasive ductal carcinoma, grade 3, estrogen and progesterone receptor positive, HER-2 not amplified, with an MIB-1 of 90%  (1) Oncotype DX Score of 60 predicts a risk of recurrence outside the breast of 34% if the patient's only systemic therapy is tamoxifen for 5 years. On a similar database this risk drops to about 12% if chemotherapy is also given  (2) genetics testing sent 11/28/2016; The Breast/GYN gene panel offered by [facility] includes sequencing and rearrangement analysis for the following 23 genes:  ATM, BARD1, BRCA1, BRCA2, BRIP1, CDH1, CHEK2, EPCAM, FANCC, MLH1, MSH2, MSH6, MUTYH, NBN, NF1, PALB2, PMS2, POLD1, PTEN, RAD51C, RAD51D, RECQL, and TP53.   --results pending   (3) chemotherapy to consist of cyclophosphamide and doxorubicin in dose dense fashion 4, followed by Abraxane weekly 12.   (4) definitive surgery to follow  (5) adjuvant radiation to follow as appropriate.  PLAN: I spent approximately 30 minutes with team today going over her situation in detail and most of this time was spent in counseling regarding treatment options. We reviewed the Oncotype results, which are among the highest that I have had in any patients. Basically they tell us that if she does not receive chemotherapy 1 out of 3 women like her would have recurrent metastatic disease within 10 years. The good news is that with chemotherapy that started that drops to approximately 12%, and is in addition instead of using tamoxifen for the anti-estrogens we use anastrozole very likely we can get her to slightly less than 10% risk of outside the breast recurrence within 10 years  We discussed the possible toxicities side effects and complications of chemotherapy including the  need for an echocardiogram and a port. She is ready to proceed, and is hoping to be able to continue to work right to chemotherapy.  Accordingly we are setting her up for treatment on a Thursday, with the expectation that she may be able to return to work the following Monday. We will try to avoid dexamethasone for nausea  control and lean on prochlorperazine. We will use Abraxane instead of Taxol so that she can avoid steroids altogether with those treatments.  He has a good understanding of this plan. She will come to chemotherapy school and have her echo as well as her port placed before she returns to see me. She knows to call for any problems that may develop before that visit.  Chauncey Cruel, MD   12/16/2016 12:18 PM Medical Oncology and Hematology [facility] [address] [place], [place] [address] Tel. [phone]    Fax. [phone]

## 2016-12-14 NOTE — Progress Notes (Signed)
START OFF PATHWAY REGIMEN - Breast  Off Pathway: Dose-Dense AC q14 days  [id number]:Dose-Dense AC q14 days:   A cycle is every 14 days:     Doxorubicin (Adriamycin(R)) 60 mg/m2 IV push on day 1 only. Dose Mod: None     Cyclophosphamide (Cytoxan(R)) 600 mg/m2 in 250 mL NS IV over 30 minutes on day 1 only. Dose Mod: None     Pegfilgrastim (Neulasta(R)) 6 mg flat dose subcutaneously once on day 2 only.  G-CSF recommended due to data showing a >20% risk of febrile neutropenia. Dose Mod: None  **Always confirm dose/schedule in your pharmacy ordering system**    Patient Characteristics: Neoadjuvant Chemotherapy, HER2/neu Negative/Unknown/Equivocal, ER Positive AJCC Stage Grouping: IA Current Disease Status: No Distant Mets or Local Recurrence AJCC M Stage: 0 ER Status: Positive (+) AJCC N Stage: 0 AJCC T Stage: 1c HER2/neu: Negative (-) PR Status: Positive (+)  Intent of Therapy: Curative Intent, Discussed with Patient

## 2016-12-14 NOTE — Progress Notes (Signed)
START OFF PATHWAY REGIMEN - Breast  Off Pathway: Nab-Paclitaxel (Abraxane(R)) 100 mg/m2 D1,8,15 q28 days  [id number]:Nab-Paclitaxel (Abraxane(R)) 100 mg/m2 D1,8,15 q28 days:   A cycle is every 28 days (3 weeks on and 1 week off):     Nab-paclitaxel (protein bound) (Abraxane(R)) 100 mg/m2 in solution volume per manufacturer guidelines via IV over 30 minutes days 1, 8, 15 (3 weeks on followed by one week off) Dose Mod: None  **Always confirm dose/schedule in your pharmacy ordering system**    Patient Characteristics: Neoadjuvant Chemotherapy, HER2/neu Negative/Unknown/Equivocal, ER Positive AJCC Stage Grouping: IA Current Disease Status: No Distant Mets or Local Recurrence AJCC M Stage: 0 ER Status: Positive (+) AJCC N Stage: 0 AJCC T Stage: 1c HER2/neu: Negative (-) PR Status: Positive (+)  Intent of Therapy: Curative Intent, Discussed with Patient 

## 2016-12-17 ENCOUNTER — Other Ambulatory Visit: Payer: Self-pay | Admitting: *Deleted

## 2016-12-17 ENCOUNTER — Other Ambulatory Visit: Payer: Self-pay | Admitting: Surgery

## 2016-12-17 DIAGNOSIS — C50212 Malignant neoplasm of upper-inner quadrant of left female breast: Secondary | ICD-10-CM

## 2016-12-17 DIAGNOSIS — Z170 Estrogen receptor positive status [ER+]: Principal | ICD-10-CM

## 2016-12-18 ENCOUNTER — Encounter: Payer: Self-pay | Admitting: Genetic Counselor

## 2016-12-18 ENCOUNTER — Telehealth: Payer: Self-pay | Admitting: Oncology

## 2016-12-18 DIAGNOSIS — Z1379 Encounter for other screening for genetic and chromosomal anomalies: Secondary | ICD-10-CM | POA: Insufficient documentation

## 2016-12-18 NOTE — Telephone Encounter (Signed)
spoke to patient to advise that FMLA paperwork was completed, patient request that it be faxed to her job at [phone]

## 2016-12-19 ENCOUNTER — Telehealth: Payer: Self-pay | Admitting: Genetic Counselor

## 2016-12-19 ENCOUNTER — Ambulatory Visit: Payer: Self-pay | Admitting: Genetic Counselor

## 2016-12-19 ENCOUNTER — Other Ambulatory Visit: Payer: BLUE CROSS/BLUE SHIELD

## 2016-12-19 DIAGNOSIS — Z803 Family history of malignant neoplasm of breast: Secondary | ICD-10-CM

## 2016-12-19 DIAGNOSIS — C50212 Malignant neoplasm of upper-inner quadrant of left female breast: Secondary | ICD-10-CM

## 2016-12-19 DIAGNOSIS — Z170 Estrogen receptor positive status [ER+]: Secondary | ICD-10-CM

## 2016-12-19 DIAGNOSIS — Z1379 Encounter for other screening for genetic and chromosomal anomalies: Secondary | ICD-10-CM

## 2016-12-19 NOTE — Telephone Encounter (Signed)
Revealed RAD51D VUS but otherwise negative genetic testing.  Would not change medical management based on this genetic test.

## 2016-12-19 NOTE — Progress Notes (Signed)
HPI: Ms. Losasso was previously seen in the [place] [facility] due to a personal and family history of cancer and concerns regarding a hereditary predisposition to cancer. Please refer to our prior cancer genetics clinic note for more information regarding Ms. Gamboa's medical, social and family histories, and our assessment and recommendations, at the time. Ms. Beahm recent genetic test results were disclosed to her, as were recommendations warranted by these results. These results and recommendations are discussed in more detail below.  CANCER HISTORY:    Malignant neoplasm of upper-inner quadrant of left breast in female, estrogen receptor positive ([place])   11/27/2016 Initial Diagnosis    Malignant neoplasm of upper-inner quadrant of left breast in female, estrogen receptor positive ([place])     12/11/2016 Genetic Testing    Patient has genetic testing done for personal and family history of breast cancer. RAD51D c.919G>A VUS identified on the Breast/Gyn Panel.  Negative genetic testing for the MSH2 inversion analysis ([place] inversion). The Breast/GYN gene panel offered by [facility] includes sequencing and rearrangement analysis for the following 23 genes:  ATM, BARD1, BRCA1, BRCA2, BRIP1, CDH1, CHEK2, EPCAM, FANCC, MLH1, MSH2, MSH6, MUTYH, NBN, NF1, PALB2, PMS2, POLD1, PTEN, RAD51C, RAD51D, RECQL, and TP53.          FAMILY HISTORY:  We obtained a detailed, 4-generation family history.  Significant diagnoses are listed below: Family History  Problem Relation Age of Onset  . Diabetes Mother   . Arthritis Mother   . Hypertension Father   . Heart disease Father   . Hyperlipidemia Father   . Breast cancer Sister 29    came back 17 years later  . Stroke Maternal Uncle   . Stroke Maternal Grandmother     The patient has two children who are cancer free. She reports not knowing much about her family. She has fives sisters and four brothers.  Three brothers are deceased from  other things than cancer.  One sister developed breast cancer at 31 and then again at 46.    The patient's parents are both deceased.  Her mother had a full brother and a paternal half brother who are both deceased.  There is no other reported cancer history in the maternal family.    The patient's father died of a brain aneurysm.  He was an only child.  There is no other reported family history of cancer.  Ms. Mckercher is unaware of previous family history of genetic testing for hereditary cancer risks. Patient's maternal ancestors are of African American descent, and paternal ancestors are of African American descent. There is no reported Ashkenazi Jewish ancestry. There is no known consanguinity.  GENETIC TEST RESULTS: Genetic testing reported out on December 11, 2016 through the Breast/GYN cancer panel found no deleterious mutations. Negative genetic testing for the MSH2 inversion analysis (Boland inversion).  The Breast/GYN gene panel offered by [facility] includes sequencing and rearrangement analysis for the following 23 genes:  ATM, BARD1, BRCA1, BRCA2, BRIP1, CDH1, CHEK2, EPCAM, FANCC, MLH1, MSH2, MSH6, MUTYH, NBN, NF1, PALB2, PMS2, POLD1, PTEN, RAD51C, RAD51D, RECQL, and TP53.     The test report has been scanned into EPIC and is located under the Molecular Pathology section of the Results Review tab.   We discussed with Ms. Takeshita that since the current genetic testing is not perfect, it is possible there may be a gene mutation in one of these genes that current testing cannot detect, but that chance is small. We also discussed, that it is  possible that another gene that has not yet been discovered, or that we have not yet tested, is responsible for the cancer diagnoses in the family, and it is, therefore, important to remain in touch with cancer genetics in the future so that we can continue to offer Ms. Dowda the most up to date genetic testing.   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 At this time, it is unknown if this variant is associated with increased cancer risk or if this is a normal finding, but most variants such as this get reclassified to being inconsequential. It should not be used to make medical management decisions. With time, we suspect the lab will determine the significance of this variant, if any. If we do learn more about it, we will try to contact Ms. Irving to discuss it further. However, it is important to stay in touch with [place] periodically and keep the address and phone number up to date.   CANCER SCREENING RECOMMENDATIONS: This result is reassuring and indicates that Ms. Apollo likely does not have an increased risk for a future cancer due to a mutation in one of these genes. This normal test also suggests that Ms. Hitch's cancer was most likely not due to an inherited predisposition associated with one of these genes.  Most cancers happen by chance and this negative test suggests that her cancer falls into this category.  We, therefore, recommended she continue to follow the cancer management and screening guidelines provided by her oncology and primary healthcare provider.   FOLLOW-UP: Lastly, we discussed with Ms. Bordwell that cancer genetics is a rapidly advancing field and it is possible that new genetic tests will be appropriate for her and/or her family members in the future. We encouraged her to remain in contact with cancer genetics on an annual basis so we can update her personal and family histories and let her know of advances in cancer genetics that may benefit this family.   Our contact number was provided. Ms. Grosz questions were answered to her satisfaction, and she knows she is welcome to call us at anytime with additional questions or concerns.   Roma Kayser, MS, [facility] Certified Genetic Counselor [email or web address]

## 2016-12-21 ENCOUNTER — Other Ambulatory Visit: Payer: Self-pay | Admitting: *Deleted

## 2016-12-21 ENCOUNTER — Other Ambulatory Visit: Payer: BLUE CROSS/BLUE SHIELD

## 2016-12-26 ENCOUNTER — Encounter (HOSPITAL_BASED_OUTPATIENT_CLINIC_OR_DEPARTMENT_OTHER): Payer: Self-pay | Admitting: *Deleted

## 2016-12-26 ENCOUNTER — Telehealth: Payer: Self-pay | Admitting: *Deleted

## 2016-12-26 NOTE — Telephone Encounter (Signed)
Left vm for pt to discuss change in appts. Contact information provided.

## 2016-12-27 ENCOUNTER — Other Ambulatory Visit: Payer: Self-pay

## 2016-12-27 ENCOUNTER — Encounter (HOSPITAL_BASED_OUTPATIENT_CLINIC_OR_DEPARTMENT_OTHER)
Admission: RE | Admit: 2016-12-27 | Discharge: 2016-12-27 | Disposition: A | Discharge status: Home / Self Care | Payer: BLUE CROSS/BLUE SHIELD | Source: Ambulatory Visit | Attending: Surgery | Admitting: Surgery

## 2016-12-27 ENCOUNTER — Encounter: Payer: Self-pay | Admitting: *Deleted

## 2016-12-27 DIAGNOSIS — Z01810 Encounter for preprocedural cardiovascular examination: Secondary | ICD-10-CM | POA: Diagnosis present

## 2016-12-27 DIAGNOSIS — I10 Essential (primary) hypertension: Secondary | ICD-10-CM | POA: Diagnosis not present

## 2016-12-27 DIAGNOSIS — E119 Type 2 diabetes mellitus without complications: Secondary | ICD-10-CM | POA: Diagnosis present

## 2016-12-27 NOTE — Progress Notes (Signed)
Bottled water given to patient with instructions to complete by 0815. Pt verbalized understanding.

## 2016-12-28 ENCOUNTER — Ambulatory Visit (HOSPITAL_COMMUNITY): Admission: RE | Admit: 2016-12-28 | Payer: BLUE CROSS/BLUE SHIELD | Source: Ambulatory Visit

## 2016-12-28 ENCOUNTER — Inpatient Hospital Stay (HOSPITAL_COMMUNITY): Admission: RE | Admit: 2016-12-28 | Payer: BLUE CROSS/BLUE SHIELD | Source: Ambulatory Visit

## 2016-12-30 NOTE — H&P (Signed)
Mellody Memos  Location: [facility] Patient #: [id number] DOB: 07-13-1966 Undefined / Language: Cleophus Molt / Race: Refused to Report/Unreported Female   History of Present Illness   The patient is a 51 year old female who presents with a complaint of left breast cancer.   The PCP is Dr. Georges Mouse  The patient was referred by Dr. Andris Baumann  Oncologist - [name]/Squire  She comes with her husband and mother, Evette Cristal.  Her last mammogram was 2015. She has a sister who had breast cancer about 15 years ago. Then her sister had a recurrence and was treated with a mastectomy in Sept 2016. As far as she knows, her sister has not had genetic testing. She had a friend at work who pushed her to a mammogram. Her LMP was Oct 2016. She is not on hormone therapy.  She had mammograms at [facility] on 11/14/2016 - which which showed a 1.5 cm oval high density mass in the upper inner quadrant of the left breast. She had a left breast biopsy on 11/21/2016 ([id number]) which showed an IDC, grade 3, ER - positive, PR - positive, Her2Neu - negative, Ki67 - 90%.  I discussed the options for breast cancer treatment with the patient. She is at the [facility], which includes medical oncology and radiation oncology. I discussed the surgical options of lumpectomy vs. mastectomy. If mastectomy, there is the possibility of reconstruction. I discussed the options of lymph node biopsy. The treatment plan depends on the pathologic staging of the tumor and the patient's personal wishes. The risks of surgery include, but are not limited to, bleeding, infection, the need for further surgery, and nerve injury. The patient has been given literature on the treatment of breast cancer.  Plan: 1) Oncotype, 2) Genetics, 3) get both these test back prior to surgery, 4) Lumpectomy with SLNBx, 5) Power port if oncotype high. I discussed the indications and  potential complications of the power port placement. The primary complications of the power port, include, but are not limited to, bleeding, infection, nerve injury, thrombosis, and pneumothorax.  Will tentatively schedule for the week of Feb 5 - 9 her surgery.  Past Medical History: 1. DM - 63 yeats Sees Dr. Chalmers Cater She is wearing a special monitor in her RUQ. 2. HTN - 20 years 3. Recent cologuard test - has never had a colonoscopy 4. right neck surgery - 2014 - [place] [facility]  Social History: Married, She comes with her husband and mother, Evette Cristal. Geoffery Spruce used to work at [employer]. Her husband had recent hernia surgery by Dr. Kieth Brightly. She has two children. 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   Past Surgical History Tawni Pummel, RN; 11/28/2016 7:59 AM) Appendectomy  Breast Biopsy  Left. Cesarean Section - Multiple  Knee Surgery  Left. Tonsillectomy   Diagnostic Studies History Tawni Pummel, RN; 11/28/2016 7:59 AM) Colonoscopy  never Mammogram  within last year Pap Smear  1-5 years ago  Medication History Tawni Pummel, RN; 11/28/2016 7:59 AM) Medications Reconciled  Social History Tawni Pummel, RN; 11/28/2016 7:59 AM) Alcohol use  Occasional alcohol use. Caffeine use  Coffee, Tea. No drug use  Tobacco use  Former smoker.  Family History Tawni Pummel, RN; 11/28/2016 7:59 AM) Arthritis  Mother. Breast Cancer  Sister. Cerebrovascular Accident  Father. Diabetes Mellitus  Sister. Heart Disease  Mother. Heart disease in female family member before age 51   Pregnancy / Birth History Tawni Pummel, RN; 11/28/2016 7:59 AM) Age at menarche  13 years. Age of menopause  12-50 Contraceptive History  Oral contraceptives. Gravida  2 Irregular periods  Maternal age  69-20 Para  2  Other Problems Tawni Pummel, RN; 11/28/2016 7:59 AM) Diabetes Mellitus  High blood pressure  Lump In Breast     Review of  Systems Sunday Spillers Ledford RN; 11/28/2016 7:59 AM) General Not Present- Appetite Loss, Chills, Fatigue, Fever, Night Sweats, Weight Gain and Weight Loss. Skin Not Present- Change in Wart/Mole, Dryness, Hives, Jaundice, New Lesions, Non-Healing Wounds, Rash and Ulcer. HEENT Not Present- Earache, Hearing Loss, Hoarseness, Nose Bleed, Oral Ulcers, Ringing in the Ears, Seasonal Allergies, Sinus Pain, Sore Throat, Visual Disturbances, Wears glasses/contact lenses and Yellow Eyes. Respiratory Not Present- Bloody sputum, Chronic Cough, Difficulty Breathing, Snoring and Wheezing. Breast Present- Breast Mass. Not Present- Breast Pain, Nipple Discharge and Skin Changes. Cardiovascular Not Present- Chest Pain, Difficulty Breathing Lying Down, Leg Cramps, Palpitations, Rapid Heart Rate, Shortness of Breath and Swelling of Extremities. Gastrointestinal Not Present- Abdominal Pain, Bloating, Bloody Stool, Change in Bowel Habits, Chronic diarrhea, Constipation, Difficulty Swallowing, Excessive gas, Gets full quickly at meals, Hemorrhoids, Indigestion, Nausea, Rectal Pain and Vomiting. Female Genitourinary Not Present- Frequency, Nocturia, Painful Urination, Pelvic Pain and Urgency. Musculoskeletal Not Present- Back Pain, Joint Pain, Joint Stiffness, Muscle Pain, Muscle Weakness and Swelling of Extremities. Neurological Not Present- Decreased Memory, Fainting, Headaches, Numbness, Seizures, Tingling, Tremor, Trouble walking and Weakness. Psychiatric Not Present- Anxiety, Bipolar, Change in Sleep Pattern, Depression, Fearful and Frequent crying. Endocrine Not Present- Cold Intolerance, Excessive Hunger, Hair Changes, Heat Intolerance, Hot flashes and New Diabetes. Hematology Not Present- Blood Thinners, Easy Bruising, Excessive bleeding, Gland problems, HIV and Persistent Infections.   Physical Exam Shanon Brow H. Lucia Gaskins MD; 11/28/2016 12:00 PM) The physical exam findings are as follows: Note:General: Obese AA F alert and  generally healthy appearing. Skin: Inspection and palpation of the skin unremarkable.  Eyes: Conjunctivae white, pupils equal. Face, ears, nose, mouth, and throat: Face - normal. Normal ears and nose. Lips and teeth normal.  Neck: Supple. No mass. Trachea midline. No thyroid mass. She has a scar at the base of her right neck from the cervical disk surgery. Lymph Nodes: No supraclavicular or cervical adenopathy. No axillary adenopathy.  Lungs: Normal respiratory effort. Clear to auscultation and symmetric breath sounds. Cardiovascular: Regular rate and rythm. Normal auscultation of the heart. No murmur or rub. Normal carotid pulse.  Breasts: Right - no mass or nodule Left - biopsy skin scar at 12 o'clock. She has a palpable mass about 1.0 cm in size at the 11 o'clock position - about 1-2 cm off the areola  Abdomen: Soft. No mass. Liver and spleen not palpable. No tenderness. No hernia. Normal bowel sounds.  She has her blood sugar measuring disk in the RUQ  Musculoskeletal/extremities: Normal gait. Good strength and ROM in upper and lower extremities.   Neurologic: Grossly intact to motor and sensory function.  Psychiatric: Has normal mood and affect. Judgement and insight appear normal.   Assessment & Plan  1.  MALIGNANT NEOPLASM OF LEFT BREAST, STAGE 1, ESTROGEN RECEPTOR POSITIVE (C50.912)  Story: Left breast biopsy on 11/21/2016 ([id number]) which showed an IDC, grade 3, ER - positive, PR - positive, Her2Neu - negative, Ki67 - 90%.  Oncology - [name] and Isidore Moos  Plan:   1) Oncotype,    Addendum Note(Damaso Laday H. Lucia Gaskins MD; 12/11/2016 3:42 PM)    Ms Mccalla's oncotype is 55!    She is scheduled to see Dr. 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   6) Dr. Jana Hakim has started Tamoxifen  2. DM - 60 yeats Sees Dr. Chalmers Cater She is wearing a special monitor in her RUQ. 3. HTN - 20 years 4. Right neck surgery - 2014 - London Sheer, MD, [facility] Pager: [phone] Office phone:  [phone]

## 2016-12-31 ENCOUNTER — Ambulatory Visit (HOSPITAL_COMMUNITY): Payer: BLUE CROSS/BLUE SHIELD

## 2016-12-31 ENCOUNTER — Encounter (HOSPITAL_BASED_OUTPATIENT_CLINIC_OR_DEPARTMENT_OTHER)
Admission: RE | Disposition: A | Discharge status: Home / Self Care | Payer: Self-pay | Source: Ambulatory Visit | Attending: Surgery

## 2016-12-31 ENCOUNTER — Other Ambulatory Visit: Payer: Self-pay | Admitting: Surgery

## 2016-12-31 ENCOUNTER — Ambulatory Visit (HOSPITAL_BASED_OUTPATIENT_CLINIC_OR_DEPARTMENT_OTHER)
Admission: RE | Admit: 2016-12-31 | Discharge: 2016-12-31 | Disposition: A | Discharge status: Home / Self Care | Payer: BLUE CROSS/BLUE SHIELD | Source: Ambulatory Visit | Attending: Surgery | Admitting: Surgery

## 2016-12-31 ENCOUNTER — Ambulatory Visit (HOSPITAL_BASED_OUTPATIENT_CLINIC_OR_DEPARTMENT_OTHER): Payer: BLUE CROSS/BLUE SHIELD | Admitting: Anesthesiology

## 2016-12-31 ENCOUNTER — Encounter (HOSPITAL_COMMUNITY)
Admission: RE | Admit: 2016-12-31 | Discharge: 2016-12-31 | Disposition: A | Discharge status: Home / Self Care | Payer: BLUE CROSS/BLUE SHIELD | Source: Ambulatory Visit | Attending: Surgery | Admitting: Surgery

## 2016-12-31 ENCOUNTER — Encounter (HOSPITAL_BASED_OUTPATIENT_CLINIC_OR_DEPARTMENT_OTHER): Payer: Self-pay | Admitting: *Deleted

## 2016-12-31 DIAGNOSIS — Z95828 Presence of other vascular implants and grafts: Secondary | ICD-10-CM

## 2016-12-31 DIAGNOSIS — Z803 Family history of malignant neoplasm of breast: Secondary | ICD-10-CM | POA: Diagnosis not present

## 2016-12-31 DIAGNOSIS — I10 Essential (primary) hypertension: Secondary | ICD-10-CM | POA: Diagnosis not present

## 2016-12-31 DIAGNOSIS — Z419 Encounter for procedure for purposes other than remedying health state, unspecified: Secondary | ICD-10-CM

## 2016-12-31 DIAGNOSIS — C50912 Malignant neoplasm of unspecified site of left female breast: Secondary | ICD-10-CM

## 2016-12-31 DIAGNOSIS — Z794 Long term (current) use of insulin: Secondary | ICD-10-CM | POA: Insufficient documentation

## 2016-12-31 DIAGNOSIS — Z170 Estrogen receptor positive status [ER+]: Secondary | ICD-10-CM | POA: Insufficient documentation

## 2016-12-31 DIAGNOSIS — Z833 Family history of diabetes mellitus: Secondary | ICD-10-CM | POA: Diagnosis not present

## 2016-12-31 DIAGNOSIS — C50212 Malignant neoplasm of upper-inner quadrant of left female breast: Secondary | ICD-10-CM | POA: Insufficient documentation

## 2016-12-31 DIAGNOSIS — Z8249 Family history of ischemic heart disease and other diseases of the circulatory system: Secondary | ICD-10-CM | POA: Insufficient documentation

## 2016-12-31 DIAGNOSIS — Z87891 Personal history of nicotine dependence: Secondary | ICD-10-CM | POA: Diagnosis not present

## 2016-12-31 DIAGNOSIS — Z8261 Family history of arthritis: Secondary | ICD-10-CM | POA: Diagnosis not present

## 2016-12-31 DIAGNOSIS — Z823 Family history of stroke: Secondary | ICD-10-CM | POA: Insufficient documentation

## 2016-12-31 DIAGNOSIS — E119 Type 2 diabetes mellitus without complications: Secondary | ICD-10-CM | POA: Diagnosis not present

## 2016-12-31 HISTORY — PX: BREAST LUMPECTOMY WITH RADIOACTIVE SEED AND SENTINEL LYMPH NODE BIOPSY: SHX6550

## 2016-12-31 HISTORY — PX: PORTACATH PLACEMENT: SHX2246

## 2016-12-31 LAB — GLUCOSE, CAPILLARY
Glucose-Capillary: 160 mg/dL — ABNORMAL HIGH (ref 65–99)
Glucose-Capillary: 215 mg/dL — ABNORMAL HIGH (ref 65–99)

## 2016-12-31 SURGERY — BREAST LUMPECTOMY WITH RADIOACTIVE SEED AND SENTINEL LYMPH NODE BIOPSY
Anesthesia: General | Site: Breast

## 2016-12-31 MED ORDER — EPHEDRINE SULFATE 50 MG/ML IJ SOLN
INTRAMUSCULAR | Status: DC | PRN
  Administered 2016-12-31: 5 mg via INTRAVENOUS
  Administered 2016-12-31: 10 mg via INTRAVENOUS

## 2016-12-31 MED ORDER — GABAPENTIN 300 MG PO CAPS
ORAL_CAPSULE | ORAL | Status: AC
  Filled 2016-12-31: qty 1

## 2016-12-31 MED ORDER — BUPIVACAINE HCL (PF) 0.25 % IJ SOLN
INTRAMUSCULAR | Status: DC | PRN
  Administered 2016-12-31: 5 mL
  Administered 2016-12-31: 15 mL

## 2016-12-31 MED ORDER — PHENYLEPHRINE HCL 10 MG/ML IJ SOLN
INTRAMUSCULAR | Status: DC | PRN
  Administered 2016-12-31 (×3): 80 ug via INTRAVENOUS

## 2016-12-31 MED ORDER — MIDAZOLAM HCL 2 MG/2ML IJ SOLN
INTRAMUSCULAR | Status: AC
  Filled 2016-12-31: qty 2

## 2016-12-31 MED ORDER — CEFAZOLIN SODIUM-DEXTROSE 2-4 GM/100ML-% IV SOLN
INTRAVENOUS | Status: AC
  Filled 2016-12-31: qty 100

## 2016-12-31 MED ORDER — HYDROCODONE-ACETAMINOPHEN 5-325 MG PO TABS: 1.0000 | ORAL_TABLET | Freq: Four times a day (QID) | ORAL | 0 refills | Status: DC | PRN

## 2016-12-31 MED ORDER — CHLORHEXIDINE GLUCONATE CLOTH 2 % EX PADS: 6.0000 | MEDICATED_PAD | Freq: Once | CUTANEOUS | Status: DC

## 2016-12-31 MED ORDER — HEPARIN SOD (PORK) LOCK FLUSH 100 UNIT/ML IV SOLN
INTRAVENOUS | Status: DC | PRN
  Administered 2016-12-31: 500 [IU]

## 2016-12-31 MED ORDER — FENTANYL CITRATE (PF) 100 MCG/2ML IJ SOLN
INTRAMUSCULAR | Status: AC
  Filled 2016-12-31: qty 2

## 2016-12-31 MED ORDER — BUPIVACAINE-EPINEPHRINE (PF) 0.5% -1:200000 IJ SOLN
INTRAMUSCULAR | Status: AC
  Filled 2016-12-31: qty 30

## 2016-12-31 MED ORDER — FENTANYL CITRATE (PF) 100 MCG/2ML IJ SOLN: 25.0000 ug | INTRAMUSCULAR | Status: DC | PRN

## 2016-12-31 MED ORDER — PROMETHAZINE HCL 25 MG/ML IJ SOLN
6.2500 mg | INTRAMUSCULAR | Status: DC | PRN
Start: 2016-12-31 — End: 2016-12-31

## 2016-12-31 MED ORDER — MIDAZOLAM HCL 2 MG/2ML IJ SOLN
1.0000 mg | INTRAMUSCULAR | Status: DC | PRN
  Administered 2016-12-31: 1 mg via INTRAVENOUS

## 2016-12-31 MED ORDER — PROPOFOL 10 MG/ML IV BOLUS
INTRAVENOUS | Status: DC | PRN
  Administered 2016-12-31: 200 mg via INTRAVENOUS

## 2016-12-31 MED ORDER — BUPIVACAINE-EPINEPHRINE (PF) 0.5% -1:200000 IJ SOLN
INTRAMUSCULAR | Status: DC | PRN
  Administered 2016-12-31: 30 mL via PERINEURAL

## 2016-12-31 MED ORDER — SODIUM CHLORIDE 0.9 % IJ SOLN
INTRAMUSCULAR | Status: AC
  Filled 2016-12-31: qty 10

## 2016-12-31 MED ORDER — GABAPENTIN 300 MG PO CAPS
300.0000 mg | ORAL_CAPSULE | ORAL | Status: AC
  Administered 2016-12-31: 300 mg via ORAL

## 2016-12-31 MED ORDER — CEFAZOLIN SODIUM-DEXTROSE 2-4 GM/100ML-% IV SOLN
2.0000 g | INTRAVENOUS | Status: AC
  Administered 2016-12-31: 2 g via INTRAVENOUS

## 2016-12-31 MED ORDER — BUPIVACAINE HCL (PF) 0.25 % IJ SOLN
INTRAMUSCULAR | Status: AC
  Filled 2016-12-31: qty 30

## 2016-12-31 MED ORDER — HEPARIN (PORCINE) IN NACL 2-0.9 UNIT/ML-% IJ SOLN
INTRAMUSCULAR | Status: DC | PRN
  Administered 2016-12-31: 1

## 2016-12-31 MED ORDER — LACTATED RINGERS IV SOLN
INTRAVENOUS | Status: DC
  Administered 2016-12-31 (×2): via INTRAVENOUS

## 2016-12-31 MED ORDER — SCOPOLAMINE 1 MG/3DAYS TD PT72: 1.0000 | MEDICATED_PATCH | Freq: Once | TRANSDERMAL | Status: DC | PRN

## 2016-12-31 MED ORDER — ACETAMINOPHEN 500 MG PO TABS
ORAL_TABLET | ORAL | Status: AC
  Filled 2016-12-31: qty 2

## 2016-12-31 MED ORDER — ACETAMINOPHEN 500 MG PO TABS
1000.0000 mg | ORAL_TABLET | ORAL | Status: AC
  Administered 2016-12-31: 1000 mg via ORAL

## 2016-12-31 MED ORDER — TECHNETIUM TC 99M SULFUR COLLOID FILTERED
1.0000 | Freq: Once | INTRAVENOUS | Status: AC | PRN
  Administered 2016-12-31: 1 via INTRADERMAL

## 2016-12-31 MED ORDER — LIDOCAINE 2% (20 MG/ML) 5 ML SYRINGE
INTRAMUSCULAR | Status: DC | PRN
  Administered 2016-12-31: 100 mg via INTRAVENOUS

## 2016-12-31 MED ORDER — FENTANYL CITRATE (PF) 100 MCG/2ML IJ SOLN
50.0000 ug | INTRAMUSCULAR | Status: AC | PRN
  Administered 2016-12-31 (×3): 50 ug via INTRAVENOUS

## 2016-12-31 MED ORDER — ONDANSETRON HCL 4 MG/2ML IJ SOLN
INTRAMUSCULAR | Status: DC | PRN
  Administered 2016-12-31: 4 mg via INTRAVENOUS

## 2016-12-31 SURGICAL SUPPLY — 71 items
BAG DECANTER FOR FLEXI CONT (MISCELLANEOUS) ×4 IMPLANT
BENZOIN TINCTURE PRP APPL 2/3 (GAUZE/BANDAGES/DRESSINGS) IMPLANT
BINDER BREAST LRG (GAUZE/BANDAGES/DRESSINGS) IMPLANT
BINDER BREAST MEDIUM (GAUZE/BANDAGES/DRESSINGS) IMPLANT
BINDER BREAST XLRG (GAUZE/BANDAGES/DRESSINGS) IMPLANT
BINDER BREAST XXLRG (GAUZE/BANDAGES/DRESSINGS) ×4 IMPLANT
BLADE SURG 15 STRL LF DISP TIS (BLADE) ×2 IMPLANT
BLADE SURG 15 STRL SS (BLADE) ×2
CANISTER SUC SOCK COL 7IN (MISCELLANEOUS) IMPLANT
CANISTER SUCT 1200ML W/VALVE (MISCELLANEOUS) ×4 IMPLANT
CHLORAPREP W/TINT 26ML (MISCELLANEOUS) ×8 IMPLANT
CLEANER CAUTERY TIP 5X5 PAD (MISCELLANEOUS) ×2 IMPLANT
CLIP TI WIDE RED SMALL 6 (CLIP) ×4 IMPLANT
CLOSURE WOUND 1/2 X4 (GAUZE/BANDAGES/DRESSINGS)
CLOSURE WOUND 1/4X4 (GAUZE/BANDAGES/DRESSINGS)
COVER BACK TABLE 60X90IN (DRAPES) ×4 IMPLANT
COVER MAYO STAND STRL (DRAPES) ×4 IMPLANT
COVER PROBE 5X48 (MISCELLANEOUS) ×2
COVER PROBE W GEL 5X96 (DRAPES) ×4 IMPLANT
DECANTER SPIKE VIAL GLASS SM (MISCELLANEOUS) IMPLANT
DERMABOND ADVANCED (GAUZE/BANDAGES/DRESSINGS) ×2
DERMABOND ADVANCED .7 DNX12 (GAUZE/BANDAGES/DRESSINGS) ×2 IMPLANT
DEVICE DUBIN W/COMP PLATE 8390 (MISCELLANEOUS) ×4 IMPLANT
DRAPE C-ARM 42X72 X-RAY (DRAPES) ×4 IMPLANT
DRAPE LAPAROSCOPIC ABDOMINAL (DRAPES) ×4 IMPLANT
DRAPE LAPAROTOMY T 102X78X121 (DRAPES) ×4 IMPLANT
DRAPE UTILITY XL STRL (DRAPES) ×8 IMPLANT
DRSG PAD ABDOMINAL 8X10 ST (GAUZE/BANDAGES/DRESSINGS) ×4 IMPLANT
ELECT COATED BLADE 2.86 ST (ELECTRODE) ×4 IMPLANT
ELECT REM PT RETURN 9FT ADLT (ELECTROSURGICAL) ×4
ELECTRODE REM PT RTRN 9FT ADLT (ELECTROSURGICAL) ×2 IMPLANT
GAUZE SPONGE 4X4 12PLY STRL (GAUZE/BANDAGES/DRESSINGS) ×4 IMPLANT
GLOVE BIO SURGEON STRL SZ7.5 (GLOVE) ×4 IMPLANT
GLOVE SURG SIGNA 7.5 PF LTX (GLOVE) ×8 IMPLANT
GOWN STRL REUS W/ TWL LRG LVL3 (GOWN DISPOSABLE) ×2 IMPLANT
GOWN STRL REUS W/ TWL XL LVL3 (GOWN DISPOSABLE) ×2 IMPLANT
GOWN STRL REUS W/TWL LRG LVL3 (GOWN DISPOSABLE) ×2
GOWN STRL REUS W/TWL XL LVL3 (GOWN DISPOSABLE) ×2
IV CATH AUTO 14GX1.75 SAFE ORG (IV SOLUTION) IMPLANT
IV CATH PLACEMENT UNIT 16 GA (IV SOLUTION) IMPLANT
IV KIT MINILOC 20X1 SAFETY (NEEDLE) IMPLANT
KIT CVR 48X5XPRB PLUP LF (MISCELLANEOUS) ×2 IMPLANT
KIT MARKER MARGIN INK (KITS) ×4 IMPLANT
KIT PORT POWER 8FR ISP CVUE (Catheter) ×4 IMPLANT
NDL SAFETY ECLIPSE 18X1.5 (NEEDLE) IMPLANT
NEEDLE HYPO 18GX1.5 SHARP (NEEDLE)
NEEDLE HYPO 25X1 1.5 SAFETY (NEEDLE) ×4 IMPLANT
NS IRRIG 1000ML POUR BTL (IV SOLUTION) ×4 IMPLANT
PACK BASIN DAY SURGERY FS (CUSTOM PROCEDURE TRAY) ×4 IMPLANT
PAD CLEANER CAUTERY TIP 5X5 (MISCELLANEOUS) ×2
PENCIL BUTTON HOLSTER BLD 10FT (ELECTRODE) ×4 IMPLANT
SET SHEATH INTRODUCER 10FR (MISCELLANEOUS) IMPLANT
SHEATH COOK PEEL AWAY SET 9F (SHEATH) IMPLANT
SHEET MEDIUM DRAPE 40X70 STRL (DRAPES) ×4 IMPLANT
SLEEVE SCD COMPRESS KNEE MED (MISCELLANEOUS) ×4 IMPLANT
SPONGE GAUZE 4X4 12PLY STER LF (GAUZE/BANDAGES/DRESSINGS) ×4 IMPLANT
SPONGE LAP 18X18 X RAY DECT (DISPOSABLE) ×4 IMPLANT
STRIP CLOSURE SKIN 1/2X4 (GAUZE/BANDAGES/DRESSINGS) IMPLANT
STRIP CLOSURE SKIN 1/4X4 (GAUZE/BANDAGES/DRESSINGS) IMPLANT
SUT ETHILON 2 0 FS 18 (SUTURE) IMPLANT
SUT ETHILON 3 0 PS 1 (SUTURE) IMPLANT
SUT MNCRL AB 4-0 PS2 18 (SUTURE) ×4 IMPLANT
SUT VICRYL 3-0 CR8 SH (SUTURE) ×4 IMPLANT
SYR 5ML LUER SLIP (SYRINGE) ×4 IMPLANT
SYR BULB IRRIGATION 50ML (SYRINGE) ×4 IMPLANT
SYR CONTROL 10ML LL (SYRINGE) ×4 IMPLANT
TOWEL OR 17X24 6PK STRL BLUE (TOWEL DISPOSABLE) ×8 IMPLANT
TOWEL OR NON WOVEN STRL DISP B (DISPOSABLE) ×4 IMPLANT
TUBE CONNECTING 20'X1/4 (TUBING) ×1
TUBE CONNECTING 20X1/4 (TUBING) ×3 IMPLANT
YANKAUER SUCT BULB TIP NO VENT (SUCTIONS) ×4 IMPLANT

## 2016-12-31 NOTE — Op Note (Signed)
12/31/2016  2:56 PM  PATIENT:  Abigail Yoder DOB: 08-28-1966 MRN: [id number]  PREOP DIAGNOSIS:   LEFT BREAST CANCER  POSTOP DIAGNOSIS:    Left breast cancer, 11 o'clock position (T1, N0)  PROCEDURE:   Procedure(s):  Left BREAST LUMPECTOMY WITH RADIOACTIVE SEED localization AND left axillary SENTINEL LYMPH NODE BIOPSY, deep sentinel lymph node biopsy, INSERTION right subclavian power port  SURGEON:   Alphonsa Overall, M.D.  ANESTHESIA:   general  Anesthesiologist: Catalina Gravel, MD CRNA: April W Carter, CRNA  General  EBL:  75  ml  DRAINS:  none   LOCAL MEDICATIONS USED:   20 cc of 1/4% marcaine and left pectoral block by anesthesia  SPECIMEN:   Left breast lumpectomy (six color paint), medial margin, left axillary sentinel lymph node biopsy  COUNTS CORRECT:  YES  INDICATIONS FOR PROCEDURE:  Abigail Yoder is a 51 y.o. (DOB: 01/10/1966) AA female whose primary care physician is Abigail Gravel, MD and comes for left breast lumpectomy, left axillary sentinel lymph node biopsy, and placement of a power port.   Abigail Yoder was seen at the breast multidisciplinary breast clinic with Drs. [name] and Isidore Moos for a new left breast cancer.  Her Oncotype score came back as 60.  Her genetics were negative.   The options for breast cancer treatment have been discussed with the patient. She elected to proceed with lumpectomy and axillary sentinel lymph node.     The indications and potential complications of surgery were explained to the patient. Potential complications include, but are not limited to, bleeding, infection, the need for further surgery, and nerve injury.     She had a I131 seed placed in her left breast at [facility].  I confirmed the presence of the I131 seed in the pre op area using the Neoprobe.  The seed is in the 11 o'clock position of the left breast.   In the holding area, her left areola was injected with 1 millicurie of Technitium Sulfur Colloid.  OPERATIVE NOTE:   The  patient was taken to room # 1 at [facility] where she underwent a general anesthesia  supervised by Anesthesiologist: Catalina Gravel, MD CRNA: April W Carter, CRNA. Her left breast and axilla were prepped with  ChloraPrep and sterilely draped.    A time-out and the surgical check list was reviewed.    I turned attention to the cancer which was about at the 11 o'clock position of the left breast.   I used the Neoprobe to identify the I131 seed.  I tried to excise an area around the tumor of at least 1 cm.    I excised this block of breast tissue approximately 4 cm by 5 cm  in diameter.   I painted the lumpectomy specimen with the 6 color paint kit and did a specimen mammogram which confirmed the mass, clip, and the seed were all in the right position in the specimen.  The specimen was sent to pathology who called back to confirm that they have the seed and the specimen.   I then started the left deep axillary sentinel lymph node biopsy. I made an incision in the left axilla.  I found a hot area at the junction of the breast and the pectoralis major muscle, deep in the axilla. I cut down and  identified a hot node that had counts of 650 and the background has 5 counts. I checked her internal mammary nodes and supraclavicular nodes with the neoprobe  and found no other hot area. The axillary node was then sent to pathology.    I then irrigated the wound with saline. I infiltrated approximately 15 mL of 1/4% Marcaine between the incisions.  I placed 4 clips to mark biopsy cavity, at 12, 3, 6, and 9 o'clock.   I then closed all the wounds in layers using 3-0 Vicryl sutures for the deep layer. At the skin, I closed the incisions with a 4-0 Monocryl suture. The incisions were then painted with Dermabond.     The patient was then repositioned with her arms tucked and a roll placed in the midline under her back.  Another time out was held.   The patient was placed in Trendelenburg position.  The right  subclavian vein was accessed with a 16 gauge needle and a guide wire threaded through the needle into the vein.  The position of the wire was checked with fluoroscopy.   I then developed a pocket in the upper inner aspect of the right chest for the port reservoir.  I used the [employer] for venous access.  The reservoir was sewn in place with a 3-0 Vicryl suture.  The reservoir had been flushed with dilute (10 units/cc) heparin.   I then passed the silastic tubing from the reservoir incision to the subclavian stick site and used the 8 French introducer to pass it into the vein.  The tip of the silastic catheter was position at the junction of the SVC and the right atrium under fluoroscopy.  The silastic catheter was then attached to the port with the bayonet device.     The entire port and tubing were checked with fluoroscopy and then the port was flushed with 4 cc of concentrated heparin (100 units/cc).   The wounds were then closed with 3-0 vicryl subcutaneous sutures and the skin closed with a 4-0 Monocryl suture.  The skin was painted with DermaBond.   She had gauze place over the wounds and placed in a breast binder.   The patient tolerated the procedure well, was transported to the recovery room in good condition. Sponge and needle count were correct at the end of the case.   Final pathology is pending.   A CXR has been ordered.   Alphonsa Overall, MD, [facility] Pager: [phone] Office phone:  [phone]

## 2016-12-31 NOTE — Anesthesia Preprocedure Evaluation (Signed)
Anesthesia Evaluation  Patient identified by MRN, date of birth, ID band Patient awake    Reviewed: Allergy & Precautions, NPO status , Patient's Chart, lab work & pertinent test results  Airway Mallampati: II  TM Distance: >3 FB Neck ROM: Full    Dental  (+) Teeth Intact, Dental Advisory Given   Pulmonary former smoker,    Pulmonary exam normal breath sounds clear to auscultation       Cardiovascular hypertension, Pt. on medications Normal cardiovascular exam Rhythm:Regular Rate:Normal     Neuro/Psych  Headaches, negative psych ROS   GI/Hepatic negative GI ROS, Neg liver ROS,   Endo/Other  diabetes, Type 2, Insulin Dependent, Oral Hypoglycemic AgentsObesity   Renal/GU negative Renal ROS     Musculoskeletal negative musculoskeletal ROS (+)   Abdominal   Peds  Hematology  (+) Blood dyscrasia, anemia ,   Anesthesia Other Findings Day of surgery medications reviewed with the patient.  Left breast cancer   Reproductive/Obstetrics                             Anesthesia Physical Anesthesia Plan  ASA: II  Anesthesia Plan: General   Post-op Pain Management:  Regional for Post-op pain   Induction: Intravenous  Airway Management Planned: LMA  Additional Equipment:   Intra-op Plan:   Post-operative Plan: Extubation in OR  Informed Consent: I have reviewed the patients History and Physical, chart, labs and discussed the procedure including the risks, benefits and alternatives for the proposed anesthesia with the patient or authorized representative who has indicated his/her understanding and acceptance.   Dental advisory given  Plan Discussed with: CRNA  Anesthesia Plan Comments: (Risks/benefits of general anesthesia discussed with patient including risk of damage to teeth, lips, gum, and tongue, nausea/vomiting, allergic reactions to medications, and the possibility of heart attack,  stroke and death.  All patient questions answered.  Patient wishes to proceed.)        Anesthesia Quick Evaluation

## 2016-12-31 NOTE — Progress Notes (Signed)
Assisted Dr. Turk with left, ultrasound guided, pectoralis block. Side rails up, monitors on throughout procedure. See vital signs in flow sheet. Tolerated Procedure well. 

## 2016-12-31 NOTE — Anesthesia Procedure Notes (Signed)
Procedure Name: LMA Insertion Date/Time: 12/31/2016 12:48 PM Performed by: Lieutenant Diego Pre-anesthesia Checklist: Patient identified, Emergency Drugs available, Suction available and Patient being monitored Patient Re-evaluated:Patient Re-evaluated prior to inductionOxygen Delivery Method: Circle system utilized Preoxygenation: Pre-oxygenation with 100% oxygen Intubation Type: IV induction Ventilation: Mask ventilation without difficulty LMA: LMA inserted LMA Size: 4.0 Number of attempts: 1 Airway Equipment and Method: Bite block Placement Confirmation: positive ETCO2 and breath sounds checked- equal and bilateral Tube secured with: Tape Dental Injury: Teeth and Oropharynx as per pre-operative assessment

## 2016-12-31 NOTE — Transfer of Care (Signed)
Immediate Anesthesia Transfer of Care Note  Patient: Abigail Yoder  Procedure(s) Performed: Procedure(s): BREAST LUMPECTOMY WITH RADIOACTIVE SEED AND SENTINEL LYMPH NODE BIOPSY (Left) INSERTION PORT-A-CATH WITH [place] (N/A)  Patient Location: PACU  Anesthesia Type:General  Level of Consciousness: sedated  Airway & Oxygen Therapy: Patient Spontanous Breathing and Patient connected to face mask oxygen  Post-op Assessment: Report given to RN and Post -op Vital signs reviewed and stable  Post vital signs: Reviewed and stable  Last Vitals:  Vitals:   12/31/16 1115 12/31/16 1130  BP: (!) 146/125 (!) 173/74  Pulse: 92 (!) 104  Resp: 10 (!) 0  Temp:      Last Pain:  Vitals:   12/31/16 1055  TempSrc: Oral  PainSc: 0-No pain         Complications: No apparent anesthesia complications

## 2016-12-31 NOTE — Interval H&P Note (Signed)
History and Physical Interval Note:  12/31/2016 12:37 PM  Abigail Yoder  has presented today for surgery, with the diagnosis of LEFT BREAST CANCER  The various methods of treatment have been discussed with the patient and family. She comes with her mother.  Her seed appears in good location.    After consideration of risks, benefits and other options for treatment, the patient has consented to  Procedure(s): BREAST LUMPECTOMY WITH RADIOACTIVE SEED AND SENTINEL LYMPH NODE BIOPSY (Left) INSERTION PORT-A-CATH WITH [place] (N/A) as a surgical intervention .  The patient's history has been reviewed, patient examined, no change in status, stable for surgery.  I have reviewed the patient's chart and labs.  Questions were answered to the patient's satisfaction.     Carylon Tamburro H

## 2016-12-31 NOTE — Discharge Instructions (Signed)
[facility] - DISCHARGE INSTRUCTIONS TO PATIENT  Return to work on:  Out of work untile 01/07/2017  Activity:  Driving - May drive in 1 or 2 days, if doing well and minimal pain meds.  Wound Care:   Leave bandage for 2 days, then remove and shower  Diet:  As tollerated  Follow up appointment:  Call Dr. Pollie Friar office [facility]) at [phone] for an appointment in 2 to 3 weeks.  Medications and dosages:  Resume your home medications.  You have a prescription for:  Vicodin  Call Dr. Lucia Gaskins or his office  [phone]) if you have:  Temperature greater than 100.4,  Redness, tenderness, or signs of infection (pain, swelling, redness, odor or green/yellow discharge around the site),  Difficulty breathing, headache or visual disturbances,  Any other questions or concerns you may have after discharge.  In an emergency, call 911 or go to an Emergency Department at a nearby hospital.    Post Anesthesia Home Care Instructions  Activity: Get plenty of rest for the remainder of the day. A responsible adult should stay with you for 24 hours following the procedure.  For the next 24 hours, DO NOT: -Drive a car -Paediatric nurse -Drink alcoholic beverages -Take any medication unless instructed by your physician -Make any legal decisions or sign important papers.  Meals: Start with liquid foods such as gelatin or soup. Progress to regular foods as tolerated. Avoid greasy, spicy, heavy foods. If nausea and/or vomiting occur, drink only clear liquids until the nausea and/or vomiting subsides. Call your physician if vomiting continues.  Special Instructions/Symptoms: Your throat may feel dry or sore from the anesthesia or the breathing tube placed in your throat during surgery. If this causes discomfort, gargle with warm salt water. The discomfort should disappear within 24 hours.  If you had a scopolamine patch placed behind your ear for the management of  post- operative nausea and/or vomiting:  1. The medication in the patch is effective for 72 hours, after which it should be removed.  Wrap patch in a tissue and discard in the trash. Wash hands thoroughly with soap and water. 2. You may remove the patch earlier than 72 hours if you experience unpleasant side effects which may include dry mouth, dizziness or visual disturbances. 3. Avoid touching the patch. Wash your hands with soap and water after contact with the patch.

## 2016-12-31 NOTE — Anesthesia Procedure Notes (Signed)
Anesthesia Regional Block:  Pectoralis block  Pre-Anesthetic Checklist: ,, timeout performed, Correct Patient, Correct Site, Correct Laterality, Correct Procedure, Correct Position, site marked, Risks and benefits discussed,  Surgical consent,  Pre-op evaluation,  At surgeon's request and post-op pain management  Laterality: Left  Prep: chloraprep       Needles:  Injection technique: Single-shot  Needle Type: Echogenic Needle     Needle Length: 9cm 9 cm Needle Gauge: 21 and 21 G    Additional Needles:  Procedures: ultrasound guided (picture in chart) Pectoralis block Narrative:  Start time: 12/31/2016 11:18 AM End time: 12/31/2016 11:23 AM Injection made incrementally with aspirations every 5 mL.  Performed by: Personally  Anesthesiologist: Catalina Gravel  Additional Notes: No pain on injection. No increased resistance to injection. Injection made in 5cc increments.  Good needle visualization.  Patient tolerated procedure well.

## 2017-01-01 ENCOUNTER — Encounter (HOSPITAL_BASED_OUTPATIENT_CLINIC_OR_DEPARTMENT_OTHER): Payer: Self-pay | Admitting: Surgery

## 2017-01-01 NOTE — Anesthesia Postprocedure Evaluation (Signed)
Anesthesia Post Note  Patient: Abigail Yoder  Procedure(s) Performed: Procedure(s) (LRB): BREAST LUMPECTOMY WITH RADIOACTIVE SEED AND SENTINEL LYMPH NODE BIOPSY (Left) INSERTION PORT-A-CATH WITH [place] (N/A)  Patient location during evaluation: PACU Anesthesia Type: General Level of consciousness: awake and alert Pain management: pain level controlled Vital Signs Assessment: post-procedure vital signs reviewed and stable Respiratory status: spontaneous breathing, nonlabored ventilation, respiratory function stable and patient connected to nasal cannula oxygen Cardiovascular status: blood pressure returned to baseline and stable Postop Assessment: no signs of nausea or vomiting Anesthetic complications: no       Last Vitals:  Vitals:   12/31/16 1545 12/31/16 1607  BP: (!) 171/88 (!) 170/77  Pulse: 85 84  Resp: 11 16  Temp:  36.3 C    Last Pain:  Vitals:   12/31/16 1607  TempSrc:   PainSc: 1                  Catalina Gravel

## 2017-01-03 ENCOUNTER — Other Ambulatory Visit: Payer: BLUE CROSS/BLUE SHIELD

## 2017-01-03 ENCOUNTER — Ambulatory Visit: Payer: BLUE CROSS/BLUE SHIELD

## 2017-01-03 ENCOUNTER — Ambulatory Visit: Payer: BLUE CROSS/BLUE SHIELD | Admitting: Oncology

## 2017-01-08 ENCOUNTER — Ambulatory Visit (HOSPITAL_BASED_OUTPATIENT_CLINIC_OR_DEPARTMENT_OTHER)
Admission: RE | Admit: 2017-01-08 | Discharge: 2017-01-08 | Disposition: A | Discharge status: Home / Self Care | Payer: BLUE CROSS/BLUE SHIELD | Source: Ambulatory Visit | Attending: Internal Medicine | Admitting: Internal Medicine

## 2017-01-08 ENCOUNTER — Ambulatory Visit (HOSPITAL_COMMUNITY)
Admission: RE | Admit: 2017-01-08 | Discharge: 2017-01-08 | Disposition: A | Discharge status: Home / Self Care | Payer: BLUE CROSS/BLUE SHIELD | Source: Ambulatory Visit | Attending: Oncology | Admitting: Oncology

## 2017-01-08 VITALS — BP 152/80 | HR 98 | Wt 223.0 lb

## 2017-01-08 DIAGNOSIS — Z170 Estrogen receptor positive status [ER+]: Secondary | ICD-10-CM | POA: Diagnosis present

## 2017-01-08 DIAGNOSIS — C50212 Malignant neoplasm of upper-inner quadrant of left female breast: Secondary | ICD-10-CM

## 2017-01-08 DIAGNOSIS — I5189 Other ill-defined heart diseases: Secondary | ICD-10-CM | POA: Diagnosis not present

## 2017-01-08 NOTE — Patient Instructions (Signed)
Your physician has requested that you have an echocardiogram. Echocardiography is a painless test that uses sound waves to create images of your heart. It provides your doctor with information about the size and shape of your heart and how well your heart's chambers and valves are working. This procedure takes approximately one hour. There are no restrictions for this procedure.  Your physician recommends that you schedule a follow-up appointment in: Mid April with Dr Aundra Dubin

## 2017-01-08 NOTE — Progress Notes (Signed)
  Echocardiogram 2D Echocardiogram has been performed.  Darlina Sicilian M 01/08/2017, 1:31 PM

## 2017-01-10 NOTE — Progress Notes (Signed)
Oncology: Dr. Jana Hakim  51 yo with history of HTN and DM presents for cardio-oncology evaluation.  Left breast cancer was diagnosed in 12/17.  ER+/PR+/HER2-.  Plan for cyclophosphamide + doxorubicin x 4 cycles then abraxane x 12 cycles.  She had lumpectomy 2/18.  Will have radiation.   No known cardiac disease.  No exertional dyspnea, no chest pain. She is a nonsmoker.   PMH: 1. Type II diabetes 2. HTN 3. Breast cancer: Left breast cancer diagnosed in 12/17.  ER+/PR+/HER2-.  Plan for cyclophosphamide + doxorubicin x 4 cycles then abraxane x 12 cycles.  She had lumpectomy 2/18.  Will have radiation.  - Echo (2/18): EF 60-65%, GLS -23.6%.   Social History   Social History  . Marital status: Married    Spouse name: N/A  . Number of children: 2  . Years of education: N/A   Occupational History  . Not on file.   Social History Main Topics  . Smoking status: Former Smoker    Quit date: 11/19/1994  . Smokeless tobacco: Never Used  . Alcohol use No  . Drug use: No  . Sexual activity: Yes    Birth control/ protection: Surgical   Other Topics Concern  . Not on file   Social History Narrative  . No narrative on file   Family History  Problem Relation Age of Onset  . Diabetes Mother   . Arthritis Mother   . Hypertension Father   . Heart disease Father   . Hyperlipidemia Father   . Breast cancer Sister 59    came back 17 years later  . Stroke Maternal Uncle   . Stroke Maternal Grandmother    ROS: All systems reviewed and negative except as per HPI.   Current Outpatient Prescriptions  Medication Sig Dispense Refill  . amLODipine (NORVASC) 5 MG tablet Take 5 mg by mouth daily.    Marland Kitchen dexamethasone (DECADRON) 4 MG tablet Take 2 tablets by mouth once a day on the day after chemotherapy and then take 2 tablets two times a day for 2 days. Take with food. 30 tablet 1  . HYDROcodone-acetaminophen (NORCO/VICODIN) 5-325 MG tablet Take 1-2 tablets by mouth every 6 (six) hours as needed.  30 tablet 0  . insulin aspart (NOVOLOG PENFILL) cartridge 15 units sQ at breakfast, 10 units at lunch, 15 units at dinner 15 mL 11  . lidocaine-prilocaine (EMLA) cream Apply to affected area once 30 g 3  . LORazepam (ATIVAN) 0.5 MG tablet Take 1 tablet (0.5 mg total) by mouth every 6 (six) hours as needed (Nausea or vomiting). 30 tablet 0  . losartan-hydrochlorothiazide (HYZAAR) 100-25 MG tablet Take 1 tablet by mouth daily.    . metFORMIN (GLUCOPHAGE) 1000 MG tablet Take 1,000 mg by mouth 2 (two) times daily with a meal.    . prochlorperazine (COMPAZINE) 10 MG tablet Take 1 tablet (10 mg total) by mouth every 6 (six) hours as needed (Nausea or vomiting). 30 tablet 1  . tamoxifen (NOLVADEX) 20 MG tablet Take 20 mg by mouth daily.     No current facility-administered medications for this encounter.    BP (!) 152/80   Pulse 98   Wt 223 lb (101.2 kg)   SpO2 100%   BMI 39.19 kg/m  General: NAD Neck: No JVD, no thyromegaly or thyroid nodule.  Lungs: Clear to auscultation bilaterally with normal respiratory effort. CV: Nondisplaced PMI.  Heart regular S1/S2, no S3/S4, no murmur.  No peripheral edema.  No carotid bruit.  Normal pedal pulses.  Abdomen: Soft, nontender, no hepatosplenomegaly, no distention.  Skin: Intact without lesions or rashes.  Neurologic: Alert and oriented x 3.  Psych: Normal affect. Extremities: No clubbing or cyanosis.  HEENT: Normal.   Assessment/Plan: 1. Breast cancer: She will be getting doxorubicin-based chemotherapy, to start in 3/18 for 4 cycles cyclophosphamide + doxorubicin.  Baseline echo today was normal.  We discussed the potential cardiac risk of doxorubicin and the rationale behind echo screening.  I will arrange for a repeat echo with followup in 4/18 between her 3rd and 4th cycles, then repeat echo at 1 year if it remains normal.  2. HTN: BP elevated today but suspect it will be lower with chemo. Will follow.   Loralie Champagne 01/10/2017

## 2017-01-15 NOTE — Progress Notes (Signed)
Abigail Yoder  [phone] [phone]     ID: Abigail Yoder DOB: [birth year]  MR#: [id number]  QMV#:[id number]  Patient Care Team: [name], MD as PCP - General (Internal Medicine) Alphonsa Overall, MD as Consulting Physician (General Surgery) Chauncey Cruel, MD as Consulting Physician (Oncology) Eppie Gibson, MD as Attending Physician (Radiation Oncology) Jacelyn Pi, MD as Consulting Physician (Endocrinology) Ardis Hughs, MD as Attending Physician (Urology) Charlestine Massed, NP OTHER MD:  CHIEF COMPLAINT: Estrogen receptor positive breast cancer  CURRENT TREATMENT: adjuvant chemotherapy   BREAST CANCER HISTORY: From the original intake note:  Abigail Yoder had bilateral routine screening mammography with tomography [facility] 11/14/2016. The breast density was category B. In the left breast upper inner quadrant there was a 1.5 cm high density mass which was further evaluated with ultrasonography 11/16/2016. This confirmed a 1.2 cm lobulated mass in the left breast upper inner quadrant. The left axilla was benign.  Biopsy of the left breast mass in question 11/21/2016 showed (SAA [id number]) invasive ductal carcinoma, grade 3, estrogen and progesterone receptor positive, HER-2 nonamplified, with a signals ratio of 1.05 and the number per cell 2.15, with an MIB-1 of 90%.  Her subsequent history is as detailed below  INTERVAL HISTORY: Abigail Yoder is here today for evaluation prior to receiving her first cycle of Doxorubicin and Cyclophosphamide.  She underwent left breast lumpectomy with Dr. Lucia Gaskins on 12/31/2016.  He removed a 1.8cm invasive ductal carcinoma, grade 3, one sentinel node was negative for metastases.  She has T1cN0 disease.  She is doing well today and has adjusted to her chemo port quite well.  She denies any fevers, chills, nausea, vomiting, fatigue.  She has gone to chemo class, and has undergone eval by Dr. Aundra Dubin.  She is requesting a temporary  handicapped placard due to her apartment living, in addition a note for work stating she can work at her different job locations as long as she's not around children.    REVIEW OF SYSTEMS: Abigail Yoder is doing well today.  She does have h/o diabetes and takes scheduled Novolog TID.  She sees an endocrinologist, who will be adjusting her insulin during treatment.  A detailed ROS was conducted and is non contributory.    PAST MEDICAL HISTORY: Past Medical History:  Diagnosis Date  . Anemia   . Diabetes mellitus   . Family history of breast cancer   . Hypertension   . Obesity     PAST SURGICAL HISTORY: Past Surgical History:  Procedure Laterality Date  . APPENDECTOMY    . BREAST LUMPECTOMY WITH RADIOACTIVE SEED AND SENTINEL LYMPH NODE BIOPSY Left 12/31/2016   Procedure: BREAST LUMPECTOMY WITH RADIOACTIVE SEED AND SENTINEL LYMPH NODE BIOPSY;  Surgeon: Alphonsa Overall, MD;  Location: [facility];  Service: General;  Laterality: Left;  . CERVICAL SPINE SURGERY    . CESAREAN SECTION    . KNEE SURGERY Left   . PORTACATH PLACEMENT N/A 12/31/2016   Procedure: INSERTION PORT-A-CATH WITH [place];  Surgeon: Alphonsa Overall, MD;  Location: [place] [facility];  Service: General;  Laterality: N/A;  . TUBAL LIGATION      FAMILY HISTORY Family History  Problem Relation Age of Onset  . Diabetes Mother   . Arthritis Mother   . Hypertension Father   . Heart disease Father   . Hyperlipidemia Father   . Breast cancer Sister 30    came back 17 years later  . Stroke Maternal Uncle   . Stroke  Maternal Grandmother   The patient's father died at the age of 81 from a brain aneurysm. The patient's mother died at age 19 following total hip replacement. The patient had 4 brothers, 5 sisters. One sister was diagnosed with breast cancer at age 49. There is no history of ovarian cancer in the family.  GYNECOLOGIC HISTORY:  No LMP recorded. Patient is not currently having periods (Reason:  Perimenopausal). Menarche age 63, first live birth age 48, the patient is Abigail Yoder. She is perimenopausal and her most recent period was October 2017. She never used oral contraceptives.  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 Her husband [name] works in a Proofreader. Daughter [name] works for the [employer] in [place]. Son show more right works for the Glass blower/designer at [employer] in [place]. The patient has 2 grandchildren. She attends a local [employer]: Not in place   HEALTH MAINTENANCE: Social History  Substance Use Topics  . Smoking status: Former Smoker    Quit date: 11/19/1994  . Smokeless tobacco: Never Used  . Alcohol use No     Colonoscopy:Never  PAP:2015  Bone density:Never   No Known Allergies  Current Outpatient Prescriptions  Medication Sig Dispense Refill  . amLODipine (NORVASC) 5 MG tablet Take 5 mg by mouth daily.    Marland Kitchen dexamethasone (DECADRON) 4 MG tablet Take 2 tablets by mouth once a day on the day after chemotherapy and then take 2 tablets two times a day for 2 days. Take with food. 30 tablet 1  . HYDROcodone-acetaminophen (NORCO/VICODIN) 5-325 MG tablet Take 1-2 tablets by mouth every 6 (six) hours as needed. 30 tablet 0  . insulin aspart (NOVOLOG PENFILL) cartridge 15 units sQ at breakfast, 10 units at lunch, 15 units at dinner 15 mL 11  . lidocaine-prilocaine (EMLA) cream Apply to affected area once 30 g 3  . LORazepam (ATIVAN) 0.5 MG tablet Take 1 tablet (0.5 mg total) by mouth every 6 (six) hours as needed (Nausea or vomiting). 30 tablet 0  . losartan-hydrochlorothiazide (HYZAAR) 100-25 MG tablet Take 1 tablet by mouth daily.    . metFORMIN (GLUCOPHAGE) 1000 MG tablet Take 1,000 mg by mouth 2 (two) times daily with a meal.    . NOVOLOG MIX 70/30 FLEXPEN (70-30) 100 UNIT/ML FlexPen 15UNITS IN AM/ 10 UNITS AT  LUNCH/ 15 UNITS IN PM THREE TIMES DAILY SUBCUTANEOUS 30 DAYS  6  . prochlorperazine (COMPAZINE) 10 MG tablet Take 1 tablet (10 mg total) by mouth every 6 (six) hours as needed (Nausea or vomiting). 30 tablet 1  . tamoxifen (NOLVADEX) 20 MG tablet Take 20 mg by mouth daily.     No current facility-administered medications for this visit.     OBJECTIVE:  Vitals:   01/17/17 0850  BP: 123/73  Pulse: 96  Resp: 18  Temp: 98.4 F (36.9 C)     Body mass index is 39.56 kg/m.    ECOG FS:1 - Symptomatic but completely ambulatory  GENERAL: Patient is a well appearing female in no acute distress HEENT:  Sclerae anicteric.  Oropharynx clear and moist. No ulcerations or evidence of oropharyngeal candidiasis. Neck is supple.  NODES:  No cervical, supraclavicular, or axillary lymphadenopathy palpated.  BREAST EXAM:  Left breast lumpectomy and left axillary sentinel node biopsy site are healing well, and intact, no erythema, warmth, swelling, or tenderness LUNGS:  Clear to  auscultation bilaterally.  No wheezes or rhonchi. HEART:  Regular rate and rhythm. No murmur appreciated. Port accessed in right chest wall ABDOMEN:  Soft, nontender.  Positive, normoactive bowel sounds. No organomegaly palpated. MSK:  No focal spinal tenderness to palpation. Full range of motion bilaterally in the upper extremities. EXTREMITIES:  No peripheral edema.   SKIN:  Clear with no obvious rashes or skin changes. No nail dyscrasia. NEURO:  Nonfocal. Well oriented.  Appropriate affect.   LAB RESULTS:  CMP     Component Value Date/Time   NA 142 01/17/2017 0751   K 3.8 01/17/2017 0751   CL 108 09/03/2014 1014   CO2 23 01/17/2017 0751   GLUCOSE 134 01/17/2017 0751   GLUCOSE 333 (H) 09/05/2006 1306   BUN 17.7 01/17/2017 0751   CREATININE 1.0 01/17/2017 0751   CALCIUM 9.1 01/17/2017 0751   PROT 7.2 01/17/2017 0751   ALBUMIN 3.1 (L) 01/17/2017 0751   AST 15 01/17/2017 0751   ALT 18 01/17/2017 0751   ALKPHOS 97  01/17/2017 0751   BILITOT <0.22 01/17/2017 0751   GFRNONAA 100.30 12/05/2009 1629   GFRAA 102 12/05/2007 1444    INo results found for: SPEP, UPEP  Lab Results  Component Value Date   WBC 6.3 01/17/2017   NEUTROABS 3.9 01/17/2017   HGB 9.2 (L) 01/17/2017   HCT 28.4 (L) 01/17/2017   MCV 89.0 01/17/2017   PLT 226 01/17/2017      Chemistry      Component Value Date/Time   NA 142 01/17/2017 0751   K 3.8 01/17/2017 0751   CL 108 09/03/2014 1014   CO2 23 01/17/2017 0751   BUN 17.7 01/17/2017 0751   CREATININE 1.0 01/17/2017 0751      Component Value Date/Time   CALCIUM 9.1 01/17/2017 0751   ALKPHOS 97 01/17/2017 0751   AST 15 01/17/2017 0751   ALT 18 01/17/2017 0751   BILITOT <0.22 01/17/2017 0751       No results found for: LABCA2  No components found for: [id number]  No results for input(s): INR in the last 168 hours.  Urinalysis    Component Value Date/Time   BILIRUBINUR n 06/09/2014 1651   PROTEINUR 1+ 06/09/2014 1651   UROBILINOGEN 0.2 06/09/2014 1651   NITRITE positive 06/09/2014 1651   LEUKOCYTESUR small (1+) 06/09/2014 1651     STUDIES: Nm Sentinel Node Inj-no Rpt (breast)  Result Date: 01/10/2017 There is no Radiologist interpretation  for this exam.  Dg Chest Port 1 View  Result Date: 12/31/2016 CLINICAL DATA:  Port-A-Cath placement EXAM: PORTABLE CHEST 1 VIEW COMPARISON:  None. FINDINGS: Right-sided Port-A-Cath with the tip projecting over the SVC. There is no focal parenchymal opacity. There is no pleural effusion or pneumothorax. The heart and mediastinal contours are unremarkable. The osseous structures are unremarkable. IMPRESSION: Right-sided Port-A-Cath with the tip projecting over the SVC. Electronically Signed   By: Kathreen Devoid   On: 12/31/2016 15:37   Dg Fluoro Guide Cv Line-no Report  Result Date: 12/31/2016 Fluoroscopy was utilized by the requesting physician.  No radiographic interpretation.    ELIGIBLE FOR AVAILABLE RESEARCH  PROTOCOL: No   ASSESSMENT: 51 y.o. [place] woman status post left breast upper inner quadrant biopsy 11/21/2016 for a clinical T1 cN0, stage IA invasive ductal carcinoma, grade 3, estrogen and progesterone receptor positive, HER-2 not amplified, with an MIB-1 of 90%  (1) Oncotype DX Score of 60 predicts a risk of recurrence outside the breast of 34% if the patient's only  systemic therapy is tamoxifen for 5 years. On a similar database this risk drops to about 12% if chemotherapy is also given  (2) genetics testing sent 11/28/2016; The Breast/GYN gene panel offered by [facility] includes sequencing and rearrangement analysis for the following 23 genes:  ATM, BARD1, BRCA1, BRCA2, BRIP1, CDH1, CHEK2, EPCAM, FANCC, MLH1, MSH2, MSH6, MUTYH, NBN, NF1, PALB2, PMS2, POLD1, PTEN, RAD51C, RAD51D, RECQL, and TP53.   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    (3) status post left breast lumpectomy on 12/31/2016: invasive ductal carcinoma, 1.8cm, grade 3, margins negative, 1 SLN negative, ER+(80%), PR-(2%), Ki-67 90%, HER-2 negative (ratio 1.05).  T1cN0  (4) chemotherapy to consist of cyclophosphamide and doxorubicin in dose dense fashion 4 (started 01/17/17), followed by Abraxane weekly 12.   (5) adjuvant radiation to follow as appropriate.  PLAN: Abigail Yoder is doing well today.  Her CBC is stable.  She is slightly anemic and we discussed iron rich foods to eat.  Her CMP is pending.  She will proceed with chemotherapy today (assuming her CMP is normal).  She and I reviewed in detail again possible side effects she can experience from chemotherapy.  She will return in one week for labs and evaluation.  I will complete her handicapped placard today along with a letter to her work.  She knows to call us if she has any questions or concerns whatsoever.    A total of (30) minutes of face-to-face time was spent with this  patient with greater than 50% of that time in counseling and care-coordination.   Charlestine Massed, NP   01/17/2017 9:31 AM Medical Oncology and Hematology [facility] [address] [place], [place] [address] Tel. [phone]    Fax. [phone]

## 2017-01-17 ENCOUNTER — Encounter: Payer: Self-pay | Admitting: *Deleted

## 2017-01-17 ENCOUNTER — Ambulatory Visit (HOSPITAL_BASED_OUTPATIENT_CLINIC_OR_DEPARTMENT_OTHER): Payer: BLUE CROSS/BLUE SHIELD | Admitting: Adult Health

## 2017-01-17 ENCOUNTER — Encounter: Payer: Self-pay | Admitting: Adult Health

## 2017-01-17 ENCOUNTER — Ambulatory Visit: Payer: BLUE CROSS/BLUE SHIELD

## 2017-01-17 ENCOUNTER — Ambulatory Visit (HOSPITAL_BASED_OUTPATIENT_CLINIC_OR_DEPARTMENT_OTHER): Payer: BLUE CROSS/BLUE SHIELD

## 2017-01-17 ENCOUNTER — Other Ambulatory Visit (HOSPITAL_BASED_OUTPATIENT_CLINIC_OR_DEPARTMENT_OTHER): Payer: BLUE CROSS/BLUE SHIELD

## 2017-01-17 VITALS — BP 123/73 | HR 96 | Temp 98.4°F | Resp 18 | Wt 225.1 lb

## 2017-01-17 DIAGNOSIS — Z170 Estrogen receptor positive status [ER+]: Secondary | ICD-10-CM

## 2017-01-17 DIAGNOSIS — Z171 Estrogen receptor negative status [ER-]: Principal | ICD-10-CM

## 2017-01-17 DIAGNOSIS — C50212 Malignant neoplasm of upper-inner quadrant of left female breast: Secondary | ICD-10-CM | POA: Diagnosis not present

## 2017-01-17 DIAGNOSIS — D649 Anemia, unspecified: Secondary | ICD-10-CM | POA: Diagnosis not present

## 2017-01-17 DIAGNOSIS — Z5111 Encounter for antineoplastic chemotherapy: Secondary | ICD-10-CM

## 2017-01-17 LAB — CBC WITH DIFFERENTIAL/PLATELET
BASO%: 0.2 % (ref 0.0–2.0)
Basophils Absolute: 0 10*3/uL (ref 0.0–0.1)
EOS ABS: 0.2 10*3/uL (ref 0.0–0.5)
EOS%: 3.2 % (ref 0.0–7.0)
HCT: 28.4 % — ABNORMAL LOW (ref 34.8–46.6)
HGB: 9.2 g/dL — ABNORMAL LOW (ref 11.6–15.9)
LYMPH%: 29.3 % (ref 14.0–49.7)
MCH: 28.8 pg (ref 25.1–34.0)
MCHC: 32.4 g/dL (ref 31.5–36.0)
MCV: 89 fL (ref 79.5–101.0)
MONO#: 0.3 10*3/uL (ref 0.1–0.9)
MONO%: 5.1 % (ref 0.0–14.0)
NEUT%: 62.2 % (ref 38.4–76.8)
NEUTROS ABS: 3.9 10*3/uL (ref 1.5–6.5)
PLATELETS: 226 10*3/uL (ref 145–400)
RBC: 3.19 10*6/uL — AB (ref 3.70–5.45)
RDW: 14.3 % (ref 11.2–14.5)
WBC: 6.3 10*3/uL (ref 3.9–10.3)
lymph#: 1.8 10*3/uL (ref 0.9–3.3)
nRBC: 0 % (ref 0–0)

## 2017-01-17 LAB — COMPREHENSIVE METABOLIC PANEL
ALT: 18 U/L (ref 0–55)
ANION GAP: 9 meq/L (ref 3–11)
AST: 15 U/L (ref 5–34)
Albumin: 3.1 g/dL — ABNORMAL LOW (ref 3.5–5.0)
Alkaline Phosphatase: 97 U/L (ref 40–150)
BUN: 17.7 mg/dL (ref 7.0–26.0)
CALCIUM: 9.1 mg/dL (ref 8.4–10.4)
CHLORIDE: 110 meq/L — AB (ref 98–109)
CO2: 23 meq/L (ref 22–29)
CREATININE: 1 mg/dL (ref 0.6–1.1)
EGFR: 76 mL/min/{1.73_m2} — AB (ref >90)
Glucose: 134 mg/dl (ref 70–140)
POTASSIUM: 3.8 meq/L (ref 3.5–5.1)
Sodium: 142 mEq/L (ref 136–145)
Total Bilirubin: <0.22 mg/dL (ref 0.20–1.20)
Total Protein: 7.2 g/dL (ref 6.4–8.3)

## 2017-01-17 MED ORDER — HEPARIN SOD (PORK) LOCK FLUSH 100 UNIT/ML IV SOLN
500.0000 [IU] | Freq: Once | INTRAVENOUS | Status: AC | PRN
  Administered 2017-01-17: 500 [IU]
  Filled 2017-01-17: qty 5

## 2017-01-17 MED ORDER — DOXORUBICIN HCL CHEMO IV INJECTION 2 MG/ML
60.0000 mg/m2 | Freq: Once | INTRAVENOUS | Status: AC
  Administered 2017-01-17: 128 mg via INTRAVENOUS
  Filled 2017-01-17: qty 64

## 2017-01-17 MED ORDER — PALONOSETRON HCL INJECTION 0.25 MG/5ML
0.2500 mg | Freq: Once | INTRAVENOUS | Status: AC
  Administered 2017-01-17: 0.25 mg via INTRAVENOUS

## 2017-01-17 MED ORDER — SODIUM CHLORIDE 0.9 % IV SOLN
Freq: Once | INTRAVENOUS | Status: AC
  Administered 2017-01-17: 10:00:00 via INTRAVENOUS

## 2017-01-17 MED ORDER — SODIUM CHLORIDE 0.9% FLUSH
10.0000 mL | INTRAVENOUS | Status: DC | PRN
  Administered 2017-01-17: 10 mL
  Filled 2017-01-17: qty 10

## 2017-01-17 MED ORDER — SODIUM CHLORIDE 0.9 % IV SOLN
Freq: Once | INTRAVENOUS | Status: AC
  Administered 2017-01-17: 11:00:00 via INTRAVENOUS
  Filled 2017-01-17: qty 5

## 2017-01-17 MED ORDER — PALONOSETRON HCL INJECTION 0.25 MG/5ML
INTRAVENOUS | Status: AC
  Filled 2017-01-17: qty 5

## 2017-01-17 MED ORDER — SODIUM CHLORIDE 0.9 % IV SOLN
600.0000 mg/m2 | Freq: Once | INTRAVENOUS | Status: AC
  Administered 2017-01-17: 1280 mg via INTRAVENOUS
  Filled 2017-01-17: qty 64

## 2017-01-17 NOTE — Patient Instructions (Signed)
[facility] Discharge Instructions for Patients Receiving Chemotherapy  Today you received the following chemotherapy agents adriamycin and cytoxan.  To help prevent nausea and vomiting after your treatment, we encourage you to take your nausea medication compazine and ativan.   If you develop nausea and vomiting that is not controlled by your nausea medication, call the clinic.   BELOW ARE SYMPTOMS THAT SHOULD BE REPORTED IMMEDIATELY:  *FEVER GREATER THAN 100.5 F  *CHILLS WITH OR WITHOUT FEVER  NAUSEA AND VOMITING THAT IS NOT CONTROLLED WITH YOUR NAUSEA MEDICATION  *UNUSUAL SHORTNESS OF BREATH  *UNUSUAL BRUISING OR BLEEDING  TENDERNESS IN MOUTH AND THROAT WITH OR WITHOUT PRESENCE OF ULCERS  *URINARY PROBLEMS  *BOWEL PROBLEMS  UNUSUAL RASH Items with * indicate a potential emergency and should be followed up as soon as possible.  Feel free to call the clinic you have any questions or concerns. The clinic phone number is [phone].  Please show the [facility] at check-in to the Emergency Department and triage nurse.

## 2017-01-17 NOTE — Progress Notes (Signed)
Excellent blood return before, during and after adriamycin.  Patient tolerated cytoxan infusion without any problems.

## 2017-01-17 NOTE — Patient Instructions (Signed)
Implanted Port Home Guide An implanted port is a type of central line that is placed under the skin. Central lines are used to provide IV access when treatment or nutrition needs to be given through a person's veins. Implanted ports are used for long-term IV access. An implanted port may be placed because:  You need IV medicine that would be irritating to the small veins in your hands or arms.  You need long-term IV medicines, such as antibiotics.  You need IV nutrition for a long period.  You need frequent blood draws for lab tests.  You need dialysis.  Implanted ports are usually placed in the chest area, but they can also be placed in the upper arm, the abdomen, or the leg. An implanted port has two main parts:  Reservoir. The reservoir is round and will appear as a small, raised area under your skin. The reservoir is the part where a needle is inserted to give medicines or draw blood.  Catheter. The catheter is a thin, flexible tube that extends from the reservoir. The catheter is placed into a large vein. Medicine that is inserted into the reservoir goes into the catheter and then into the vein.  How will I care for my incision site? Do not get the incision site wet. Bathe or shower as directed by your health care provider. How is my port accessed? Special steps must be taken to access the port:  Before the port is accessed, a numbing cream can be placed on the skin. This helps numb the skin over the port site.  Your health care provider uses a sterile technique to access the port. ? Your health care provider must put on a mask and sterile gloves. ? The skin over your port is cleaned carefully with an antiseptic and allowed to dry. ? The port is gently pinched between sterile gloves, and a needle is inserted into the port.  Only "non-coring" port needles should be used to access the port. Once the port is accessed, a blood return should be checked. This helps ensure that the port  is in the vein and is not clogged.  If your port needs to remain accessed for a constant infusion, a clear (transparent) bandage will be placed over the needle site. The bandage and needle will need to be changed every week, or as directed by your health care provider.  Keep the bandage covering the needle clean and dry. Do not get it wet. Follow your health care provider's instructions on how to take a shower or bath while the port is accessed.  If your port does not need to stay accessed, no bandage is needed over the port.  What is flushing? Flushing helps keep the port from getting clogged. Follow your health care provider's instructions on how and when to flush the port. Ports are usually flushed with saline solution or a medicine called heparin. The need for flushing will depend on how the port is used.  If the port is used for intermittent medicines or blood draws, the port will need to be flushed: ? After medicines have been given. ? After blood has been drawn. ? As part of routine maintenance.  If a constant infusion is running, the port may not need to be flushed.  How long will my port stay implanted? The port can stay in for as long as your health care provider thinks it is needed. When it is time for the port to come out, surgery will be   done to remove it. The procedure is similar to the one performed when the port was put in. When should I seek immediate medical care? When you have an implanted port, you should seek immediate medical care if:  You notice a bad smell coming from the incision site.  You have swelling, redness, or drainage at the incision site.  You have more swelling or pain at the port site or the surrounding area.  You have a fever that is not controlled with medicine.  This information is not intended to replace advice given to you by your health care provider. Make sure you discuss any questions you have with your health care provider. Document  Released: 11/05/2005 Document Revised: 04/12/2016 Document Reviewed: 07/13/2013 Elsevier Interactive Patient Education  2017 [employer]vier Inc[employer]  

## 2017-01-17 NOTE — Progress Notes (Signed)
Per patient request:  Called Dr. Almetta Lovely nurse and let her  know that patient will call tomorrow after she has checked two blood sugars.  Let nurse know that patient received decadron 12mg  IV today with her first chemotherapy of adriamycin and cytoxan.  She is scheduled to take 8mg  decadron po tomorrow and then 8mg  twice a day both Saturday and Sunday.  Patient wanted Dr. Chalmers Cater to have that information in order to adjust insulin if needed.

## 2017-01-18 ENCOUNTER — Telehealth: Payer: Self-pay

## 2017-01-18 ENCOUNTER — Ambulatory Visit (HOSPITAL_BASED_OUTPATIENT_CLINIC_OR_DEPARTMENT_OTHER): Payer: BLUE CROSS/BLUE SHIELD

## 2017-01-18 VITALS — BP 161/72 | HR 82 | Temp 98.7°F | Resp 18

## 2017-01-18 DIAGNOSIS — Z170 Estrogen receptor positive status [ER+]: Principal | ICD-10-CM

## 2017-01-18 DIAGNOSIS — C50212 Malignant neoplasm of upper-inner quadrant of left female breast: Secondary | ICD-10-CM

## 2017-01-18 DIAGNOSIS — Z5189 Encounter for other specified aftercare: Secondary | ICD-10-CM

## 2017-01-18 MED ORDER — PEGFILGRASTIM INJECTION 6 MG/0.6ML ~~LOC~~
6.0000 mg | PREFILLED_SYRINGE | Freq: Once | SUBCUTANEOUS | Status: AC
  Administered 2017-01-18: 6 mg via SUBCUTANEOUS
  Filled 2017-01-18: qty 0.6

## 2017-01-18 NOTE — Patient Instructions (Signed)
Pegfilgrastim injection What is this medicine? PEGFILGRASTIM (PEG fil gra stim) is a long-acting granulocyte colony-stimulating factor that stimulates the growth of neutrophils, a type of white blood cell important in the body's fight against infection. It is used to reduce the incidence of fever and infection in patients with certain types of cancer who are receiving chemotherapy that affects the bone marrow, and to increase survival after being exposed to high doses of radiation. This medicine may be used for other purposes; ask your health care provider or pharmacist if you have questions. COMMON BRAND NAME(S): Neulasta What should I tell my health care provider before I take this medicine? They need to know if you have any of these conditions: -kidney disease -latex allergy -ongoing radiation therapy -sickle cell disease -skin reactions to acrylic adhesives (On-Body Injector only) -an unusual or allergic reaction to pegfilgrastim, filgrastim, other medicines, foods, dyes, or preservatives -pregnant or trying to get pregnant -breast-feeding How should I use this medicine? This medicine is for injection under the skin. If you get this medicine at home, you will be taught how to prepare and give the pre-filled syringe or how to use the On-body Injector. Refer to the patient Instructions for Use for detailed instructions. Use exactly as directed. Tell your healthcare provider immediately if you suspect that the On-body Injector may not have performed as intended or if you suspect the use of the On-body Injector resulted in a missed or partial dose. It is important that you put your used needles and syringes in a special sharps container. Do not put them in a trash can. If you do not have a sharps container, call your pharmacist or healthcare provider to get one. Talk to your pediatrician regarding the use of this medicine in children. While this drug may be prescribed for selected conditions,  precautions do apply. Overdosage: If you think you have taken too much of this medicine contact a poison control center or emergency room at once. NOTE: This medicine is only for you. Do not share this medicine with others. What if I miss a dose? It is important not to miss your dose. Call your doctor or health care professional if you miss your dose. If you miss a dose due to an On-body Injector failure or leakage, a new dose should be administered as soon as possible using a single prefilled syringe for manual use. What may interact with this medicine? Interactions have not been studied. Give your health care provider a list of all the medicines, herbs, non-prescription drugs, or dietary supplements you use. Also tell them if you smoke, drink alcohol, or use illegal drugs. Some items may interact with your medicine. This list may not describe all possible interactions. Give your health care provider a list of all the medicines, herbs, non-prescription drugs, or dietary supplements you use. Also tell them if you smoke, drink alcohol, or use illegal drugs. Some items may interact with your medicine. What should I watch for while using this medicine? You may need blood work done while you are taking this medicine. If you are going to need a MRI, CT scan, or other procedure, tell your doctor that you are using this medicine (On-Body Injector only). What side effects may I notice from receiving this medicine? Side effects that you should report to your doctor or health care professional as soon as possible: -allergic reactions like skin rash, itching or hives, swelling of the face, lips, or tongue -dizziness -fever -pain, redness, or irritation at site   where injected -pinpoint red spots on the skin -red or dark-brown urine -shortness of breath or breathing problems -stomach or side pain, or pain at the shoulder -swelling -tiredness -trouble passing urine or change in the amount of urine Side  effects that usually do not require medical attention (report to your doctor or health care professional if they continue or are bothersome): -bone pain -muscle pain This list may not describe all possible side effects. Call your doctor for medical advice about side effects. You may report side effects to FDA at 1-800-FDA-1088. Where should I keep my medicine? Keep out of the reach of children. Store pre-filled syringes in a refrigerator between 2 and 8 degrees C (36 and 46 degrees F). Do not freeze. Keep in carton to protect from light. Throw away this medicine if it is left out of the refrigerator for more than 48 hours. Throw away any unused medicine after the expiration date. NOTE: This sheet is a summary. It may not cover all possible information. If you have questions about this medicine, talk to your doctor, pharmacist, or health care provider.  2018 Elsevier/Gold Standard (2016-11-01 12:58:03)  

## 2017-01-18 NOTE — Telephone Encounter (Signed)
-----   Message from Ignacia Felling, RN sent at 01/17/2017 10:19 AM EST ----- Regarding: Dr. Jannifer Rodney  [facility]  Dr. Jannifer Rodney   Patient phone [phone]

## 2017-01-18 NOTE — Telephone Encounter (Signed)
Pt states she is doing well after chemo yesterday. She is eating and drinking and has no nausea. She does have antiemetics on hand if needed.

## 2017-01-24 ENCOUNTER — Encounter: Payer: Self-pay | Admitting: Adult Health

## 2017-01-24 ENCOUNTER — Ambulatory Visit (HOSPITAL_BASED_OUTPATIENT_CLINIC_OR_DEPARTMENT_OTHER): Payer: BLUE CROSS/BLUE SHIELD

## 2017-01-24 ENCOUNTER — Ambulatory Visit (HOSPITAL_BASED_OUTPATIENT_CLINIC_OR_DEPARTMENT_OTHER): Payer: BLUE CROSS/BLUE SHIELD | Admitting: Adult Health

## 2017-01-24 ENCOUNTER — Other Ambulatory Visit (HOSPITAL_BASED_OUTPATIENT_CLINIC_OR_DEPARTMENT_OTHER): Payer: BLUE CROSS/BLUE SHIELD

## 2017-01-24 VITALS — BP 166/78 | HR 84 | Temp 98.6°F | Resp 18 | Ht 63.25 in | Wt 221.8 lb

## 2017-01-24 DIAGNOSIS — C50212 Malignant neoplasm of upper-inner quadrant of left female breast: Secondary | ICD-10-CM | POA: Diagnosis not present

## 2017-01-24 DIAGNOSIS — Z170 Estrogen receptor positive status [ER+]: Secondary | ICD-10-CM

## 2017-01-24 DIAGNOSIS — D701 Agranulocytosis secondary to cancer chemotherapy: Secondary | ICD-10-CM | POA: Diagnosis not present

## 2017-01-24 LAB — COMPREHENSIVE METABOLIC PANEL
ALT: 13 U/L (ref 0–55)
AST: 11 U/L (ref 5–34)
Albumin: 3 g/dL — ABNORMAL LOW (ref 3.5–5.0)
Alkaline Phosphatase: 88 U/L (ref 40–150)
Anion Gap: 7 mEq/L (ref 3–11)
BUN: 23.8 mg/dL (ref 7.0–26.0)
CHLORIDE: 106 meq/L (ref 98–109)
CO2: 25 mEq/L (ref 22–29)
Calcium: 9.2 mg/dL (ref 8.4–10.4)
Creatinine: 1.1 mg/dL (ref 0.6–1.1)
EGFR: 67 mL/min/{1.73_m2} — ABNORMAL LOW (ref >90)
Glucose: 148 mg/dl — ABNORMAL HIGH (ref 70–140)
POTASSIUM: 4.2 meq/L (ref 3.5–5.1)
SODIUM: 138 meq/L (ref 136–145)
Total Bilirubin: 0.54 mg/dL (ref 0.20–1.20)
Total Protein: 6.8 g/dL (ref 6.4–8.3)

## 2017-01-24 LAB — CBC WITH DIFFERENTIAL/PLATELET
BASO%: 0 % (ref 0.0–2.0)
BASOS ABS: 0 10*3/uL (ref 0.0–0.1)
EOS%: 1.5 % (ref 0.0–7.0)
Eosinophils Absolute: 0 10*3/uL (ref 0.0–0.5)
HCT: 28 % — ABNORMAL LOW (ref 34.8–46.6)
HGB: 9.2 g/dL — ABNORMAL LOW (ref 11.6–15.9)
LYMPH%: 92.6 % — ABNORMAL HIGH (ref 14.0–49.7)
MCH: 28.9 pg (ref 25.1–34.0)
MCHC: 32.9 g/dL (ref 31.5–36.0)
MCV: 88.1 fL (ref 79.5–101.0)
MONO#: 0 10*3/uL — ABNORMAL LOW (ref 0.1–0.9)
MONO%: 1.5 % (ref 0.0–14.0)
NEUT#: 0 10*3/uL — CL (ref 1.5–6.5)
NEUT%: 4.4 % — AB (ref 38.4–76.8)
Platelets: 110 10*3/uL — ABNORMAL LOW (ref 145–400)
RBC: 3.18 10*6/uL — AB (ref 3.70–5.45)
RDW: 13.4 % (ref 11.2–14.5)
WBC: 0.7 10*3/uL — CL (ref 3.9–10.3)
lymph#: 0.6 10*3/uL — ABNORMAL LOW (ref 0.9–3.3)

## 2017-01-24 MED ORDER — SODIUM CHLORIDE 0.9% FLUSH
10.0000 mL | Freq: Once | INTRAVENOUS | Status: AC
  Administered 2017-01-24: 10 mL via INTRAVENOUS
  Filled 2017-01-24: qty 10

## 2017-01-24 MED ORDER — CIPROFLOXACIN HCL 500 MG PO TABS: 500.0000 mg | ORAL_TABLET | Freq: Two times a day (BID) | ORAL | 0 refills | Status: DC

## 2017-01-24 MED ORDER — HEPARIN SOD (PORK) LOCK FLUSH 100 UNIT/ML IV SOLN
500.0000 [IU] | Freq: Once | INTRAVENOUS | Status: AC
  Administered 2017-01-24: 500 [IU] via INTRAVENOUS
  Filled 2017-01-24: qty 5

## 2017-01-24 NOTE — Patient Instructions (Signed)
Implanted Port Home Guide An implanted port is a type of central line that is placed under the skin. Central lines are used to provide IV access when treatment or nutrition needs to be given through a person's veins. Implanted ports are used for long-term IV access. An implanted port may be placed because:  You need IV medicine that would be irritating to the small veins in your hands or arms.  You need long-term IV medicines, such as antibiotics.  You need IV nutrition for a long period.  You need frequent blood draws for lab tests.  You need dialysis.  Implanted ports are usually placed in the chest area, but they can also be placed in the upper arm, the abdomen, or the leg. An implanted port has two main parts:  Reservoir. The reservoir is round and will appear as a small, raised area under your skin. The reservoir is the part where a needle is inserted to give medicines or draw blood.  Catheter. The catheter is a thin, flexible tube that extends from the reservoir. The catheter is placed into a large vein. Medicine that is inserted into the reservoir goes into the catheter and then into the vein.  How will I care for my incision site? Do not get the incision site wet. Bathe or shower as directed by your health care provider. How is my port accessed? Special steps must be taken to access the port:  Before the port is accessed, a numbing cream can be placed on the skin. This helps numb the skin over the port site.  Your health care provider uses a sterile technique to access the port. ? Your health care provider must put on a mask and sterile gloves. ? The skin over your port is cleaned carefully with an antiseptic and allowed to dry. ? The port is gently pinched between sterile gloves, and a needle is inserted into the port.  Only "non-coring" port needles should be used to access the port. Once the port is accessed, a blood return should be checked. This helps ensure that the port  is in the vein and is not clogged.  If your port needs to remain accessed for a constant infusion, a clear (transparent) bandage will be placed over the needle site. The bandage and needle will need to be changed every week, or as directed by your health care provider.  Keep the bandage covering the needle clean and dry. Do not get it wet. Follow your health care provider's instructions on how to take a shower or bath while the port is accessed.  If your port does not need to stay accessed, no bandage is needed over the port.  What is flushing? Flushing helps keep the port from getting clogged. Follow your health care provider's instructions on how and when to flush the port. Ports are usually flushed with saline solution or a medicine called heparin. The need for flushing will depend on how the port is used.  If the port is used for intermittent medicines or blood draws, the port will need to be flushed: ? After medicines have been given. ? After blood has been drawn. ? As part of routine maintenance.  If a constant infusion is running, the port may not need to be flushed.  How long will my port stay implanted? The port can stay in for as long as your health care provider thinks it is needed. When it is time for the port to come out, surgery will be   done to remove it. The procedure is similar to the one performed when the port was put in. When should I seek immediate medical care? When you have an implanted port, you should seek immediate medical care if:  You notice a bad smell coming from the incision site.  You have swelling, redness, or drainage at the incision site.  You have more swelling or pain at the port site or the surrounding area.  You have a fever that is not controlled with medicine.  This information is not intended to replace advice given to you by your health care provider. Make sure you discuss any questions you have with your health care provider. Document  Released: 11/05/2005 Document Revised: 04/12/2016 Document Reviewed: 07/13/2013 Elsevier Interactive Patient Education  2017 [employer]vier Inc[employer]  

## 2017-01-24 NOTE — Progress Notes (Signed)
[place]  [phone] [phone]     ID: Abigail Yoder DOB: July 21, 1966  MR#: [id number]  GEX#:[id number]  Patient Care Team: [name], MD as PCP - General (Internal Medicine) Alphonsa Overall, MD as Consulting Physician (General Surgery) Chauncey Cruel, MD as Consulting Physician (Oncology) Eppie Gibson, MD as Attending Physician (Radiation Oncology) Jacelyn Pi, MD as Consulting Physician (Endocrinology) Ardis Hughs, MD as Attending Physician (Urology) Charlestine Massed, NP OTHER MD:  CHIEF COMPLAINT: Estrogen receptor positive breast cancer  CURRENT TREATMENT: adjuvant chemotherapy   BREAST CANCER HISTORY: From the original intake note:  Abigail Yoder had bilateral routine screening mammography with tomography at [facility] 11/14/2016. The breast density was category B. In the left breast upper inner quadrant there was a 1.5 cm high density mass which was further evaluated with ultrasonography 11/16/2016. This confirmed a 1.2 cm lobulated mass in the left breast upper inner quadrant. The left axilla was benign.  Biopsy of the left breast mass in question 11/21/2016 showed (SAA [id number]) invasive ductal carcinoma, grade 3, estrogen and progesterone receptor positive, HER-2 nonamplified, with a signals ratio of 1.05 and the number per cell 2.15, with an MIB-1 of 90%.  Her subsequent history is as detailed below  INTERVAL HISTORY: Abigail Yoder is here today for evaluation after receiving her first cycle of Doxorubicin and Cyclophosphamide.  She is currently cycle 1 day 8 of treatment.  She tolerated her first round of treatment well.  She had one day of diarrhea, but that has since resolved and she thinks it is related to dill pickle chips that she ate.    REVIEW OF SYSTEMS: Abigail Yoder is doing well today.  She denies fevers, chills, nausea, vomiting, constipation, pain, mucositis, numbness, tingling or any other concerns.  She has not checked her blood sugar this  past week (has h/o diabetes and on insulin TID) due to the fact that she ran out of test strips.  She is going today to pick these up.     PAST MEDICAL HISTORY: Past Medical History:  Diagnosis Date  . Anemia   . Diabetes mellitus   . Family history of breast cancer   . Hypertension   . Obesity     PAST SURGICAL HISTORY: Past Surgical History:  Procedure Laterality Date  . APPENDECTOMY    . BREAST LUMPECTOMY WITH RADIOACTIVE SEED AND SENTINEL LYMPH NODE BIOPSY Left 12/31/2016   Procedure: BREAST LUMPECTOMY WITH RADIOACTIVE SEED AND SENTINEL LYMPH NODE BIOPSY;  Surgeon: Alphonsa Overall, MD;  Location: [place];  Service: General;  Laterality: Left;  . CERVICAL SPINE SURGERY    . CESAREAN SECTION    . KNEE SURGERY Left   . PORTACATH PLACEMENT N/A 12/31/2016   Procedure: INSERTION PORT-A-CATH WITH [place];  Surgeon: Alphonsa Overall, MD;  Location: [place];  Service: General;  Laterality: N/A;  . TUBAL LIGATION      FAMILY HISTORY Family History  Problem Relation Age of Onset  . Diabetes Mother   . Arthritis Mother   . Hypertension Father   . Heart disease Father   . Hyperlipidemia Father   . Breast cancer Sister 72    came back 17 years later  . Stroke Maternal Uncle   . Stroke Maternal Grandmother   The patient's father died at the age of 73 from a brain aneurysm. The patient's mother died at age 1 following total hip replacement. The patient had 4 brothers, 5 sisters. One sister was diagnosed with breast cancer  at age 71. There is no history of ovarian cancer in the family.  GYNECOLOGIC HISTORY:  No LMP recorded. Patient is not currently having periods (Reason: Perimenopausal). Menarche age 61, first live birth age 85, the patient is Abigail Yoder P2. She is perimenopausal and her most recent period was October 2017. She never used oral contraceptives.  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 Her  husband [name] works in a Proofreader. Daughter Janan Yoder works for the [employer] in [place]. Son show more right works for the Glass blower/designer at [employer] in [place]. The patient has 2 grandchildren. She attends a local [place]: Not in place   HEALTH MAINTENANCE: Social History  Substance Use Topics  . Smoking status: Former Smoker    Quit date: 11/19/1994  . Smokeless tobacco: Never Used  . Alcohol use No     Colonoscopy:Never  PAP:2015  Bone density:Never   No Known Allergies  Current Outpatient Prescriptions  Medication Sig Dispense Refill  . amLODipine (NORVASC) 5 MG tablet Take 5 mg by mouth daily.    Marland Kitchen dexamethasone (DECADRON) 4 MG tablet Take 2 tablets by mouth once a day on the day after chemotherapy and then take 2 tablets two times a day for 2 days. Take with food. 30 tablet 1  . insulin aspart (NOVOLOG PENFILL) cartridge 15 units sQ at breakfast, 10 units at lunch, 15 units at dinner 15 mL 11  . lidocaine-prilocaine (EMLA) cream Apply to affected area once 30 g 3  . LORazepam (ATIVAN) 0.5 MG tablet Take 1 tablet (0.5 mg total) by mouth every 6 (six) hours as needed (Nausea or vomiting). 30 tablet 0  . losartan-hydrochlorothiazide (HYZAAR) 100-25 MG tablet Take 1 tablet by mouth daily.    . metFORMIN (GLUCOPHAGE) 1000 MG tablet Take 1,000 mg by mouth 2 (two) times daily with a meal.    . NOVOLOG MIX 70/30 FLEXPEN (70-30) 100 UNIT/ML FlexPen 15UNITS IN AM/ 10 UNITS AT LUNCH/ 15 UNITS IN PM THREE TIMES DAILY SUBCUTANEOUS 30 DAYS  6  . ciprofloxacin (CIPRO) 500 MG tablet Take 1 tablet (500 mg total) by mouth 2 (two) times daily. 10 tablet 0  . prochlorperazine (COMPAZINE) 10 MG tablet Take 1 tablet (10 mg total) by mouth every 6 (six) hours as needed (Nausea or vomiting). (Patient not taking: Reported on 01/24/2017) 30 tablet 1  . tamoxifen (NOLVADEX) 20 MG tablet Take 20 mg by mouth  daily.     No current facility-administered medications for this visit.     OBJECTIVE:  Vitals:   01/24/17 1014  BP: (!) 166/78  Pulse: 84  Resp: 18  Temp: 98.6 F (37 C)     Body mass index is 38.98 kg/m.    ECOG FS:1 - Symptomatic but completely ambulatory  GENERAL: Patient is a well appearing female in no acute distress HEENT:  Sclerae anicteric.  PERRL Oropharynx clear and moist. No ulcerations or evidence of oropharyngeal candidiasis. Neck is supple.  NODES:  No cervical, supraclavicular, or axillary lymphadenopathy palpated.  BREAST EXAM:  deferred LUNGS:  Clear to auscultation bilaterally.  No wheezes or rhonchi. HEART:  Regular rate and rhythm. No murmur appreciated. Port accessed in right chest wall ABDOMEN:  Soft, nontender.  Positive, normoactive bowel sounds. No organomegaly palpated. MSK:  No focal spinal tenderness to palpation. Full range of motion bilaterally in the upper extremities. EXTREMITIES:  No peripheral edema.   SKIN:  Clear with no obvious rashes or skin changes. No nail dyscrasia. NEURO:  Nonfocal. Well oriented.  Appropriate affect.   LAB RESULTS:  CMP     Component Value Date/Time   NA 138 01/24/2017 0917   K 4.2 01/24/2017 0917   CL 108 09/03/2014 1014   CO2 25 01/24/2017 0917   GLUCOSE 148 (H) 01/24/2017 0917   GLUCOSE 333 (H) 09/05/2006 1306   BUN 23.8 01/24/2017 0917   CREATININE 1.1 01/24/2017 0917   CALCIUM 9.2 01/24/2017 0917   PROT 6.8 01/24/2017 0917   ALBUMIN 3.0 (L) 01/24/2017 0917   AST 11 01/24/2017 0917   ALT 13 01/24/2017 0917   ALKPHOS 88 01/24/2017 0917   BILITOT 0.54 01/24/2017 0917   GFRNONAA 100.30 12/05/2009 1629   GFRAA 102 12/05/2007 1444    INo results found for: SPEP, UPEP  Lab Results  Component Value Date   WBC 0.7 (LL) 01/24/2017   NEUTROABS 0.0 (LL) 01/24/2017   HGB 9.2 (L) 01/24/2017   HCT 28.0 (L) 01/24/2017   MCV 88.1 01/24/2017   PLT 110 (L) 01/24/2017      Chemistry      Component Value  Date/Time   NA 138 01/24/2017 0917   K 4.2 01/24/2017 0917   CL 108 09/03/2014 1014   CO2 25 01/24/2017 0917   BUN 23.8 01/24/2017 0917   CREATININE 1.1 01/24/2017 0917      Component Value Date/Time   CALCIUM 9.2 01/24/2017 0917   ALKPHOS 88 01/24/2017 0917   AST 11 01/24/2017 0917   ALT 13 01/24/2017 0917   BILITOT 0.54 01/24/2017 0917       No results found for: LABCA2  No components found for: [id number]  No results for input(s): INR in the last 168 hours.  Urinalysis    Component Value Date/Time   BILIRUBINUR n 06/09/2014 1651   PROTEINUR 1+ 06/09/2014 1651   UROBILINOGEN 0.2 06/09/2014 1651   NITRITE positive 06/09/2014 1651   LEUKOCYTESUR small (1+) 06/09/2014 1651     STUDIES: Nm Sentinel Node Inj-no Rpt (breast)  Result Date: 01/10/2017 There is no Radiologist interpretation  for this exam.  Dg Chest Port 1 View  Result Date: 12/31/2016 CLINICAL DATA:  Port-A-Cath placement EXAM: PORTABLE CHEST 1 VIEW COMPARISON:  None. FINDINGS: Right-sided Port-A-Cath with the tip projecting over the SVC. There is no focal parenchymal opacity. There is no pleural effusion or pneumothorax. The heart and mediastinal contours are unremarkable. The osseous structures are unremarkable. IMPRESSION: Right-sided Port-A-Cath with the tip projecting over the SVC. Electronically Signed   By: Kathreen Devoid   On: 12/31/2016 15:37   Dg Fluoro Guide Cv Line-no Report  Result Date: 12/31/2016 Fluoroscopy was utilized by the requesting physician.  No radiographic interpretation.    ELIGIBLE FOR AVAILABLE RESEARCH PROTOCOL: No   ASSESSMENT: 51 y.o. [place] woman status post left breast upper inner quadrant biopsy 11/21/2016 for a clinical T1 cN0, stage IA invasive ductal carcinoma, grade 3, estrogen and progesterone receptor positive, HER-2 not amplified, with an MIB-1 of 90%  (1) Oncotype DX Score of 60 predicts a risk of recurrence outside the breast of 34% if the patient's only  systemic therapy is tamoxifen for 5 years. On a similar database this risk drops to about 12% if chemotherapy is also given  (2) genetics testing sent 11/28/2016; The Breast/GYN gene panel offered by [facility] includes sequencing and rearrangement analysis for the following 23 genes:  ATM, BARD1, BRCA1,  BRCA2, BRIP1, CDH1, CHEK2, EPCAM, FANCC, MLH1, MSH2, MSH6, MUTYH, NBN, NF1, PALB2, PMS2, POLD1, PTEN, RAD51C, RAD51D, RECQL, and TP53.   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    (3) status post left breast lumpectomy on 12/31/2016: invasive ductal carcinoma, 1.8cm, grade 3, margins negative, 1 SLN negative, ER+(80%), PR-(2%), Ki-67 90%, HER-2 negative (ratio 1.05).  T1cN0  (4) chemotherapy to consist of cyclophosphamide and doxorubicin in dose dense fashion 4 (started 01/17/17), followed by Abraxane weekly 12.   (5) adjuvant radiation to follow as appropriate.  PLAN: Shalona is doing well today.  She tolerated chemotherapy very well.  She is subsequently neutropenic following chemotherapy.  She did receive Neulasta on day two after treatment.  I reviewed Neutropenic precautions with her in detail.  I sent in Cipro to her pharmacy for her to take BID for 5 days.  She knows to call for any fevers greater than 100.5, chills, shaking chills, or any other concerns.  She is going to avoid going to public places for the next few days.    Adysson will return in 1 week for labs, an appointment with Dr. Jana Hakim, and cycle 2 of Doxorubicin and Cyclophosphamide.  All questions were answered.  Shaylie is in agreement with the above plan, and verbalizes understanding of it.    A total of (30) minutes of face-to-face time was spent with this patient with greater than 50% of that time in counseling and care-coordination.   Charlestine Massed, NP   01/24/2017 10:33 AM Medical Oncology and Hematology [facility] [address] [place], [place] [address] Tel. [phone]    Fax. [phone]

## 2017-01-25 ENCOUNTER — Encounter: Payer: Self-pay | Admitting: Adult Health

## 2017-01-31 ENCOUNTER — Encounter: Payer: Self-pay | Admitting: *Deleted

## 2017-01-31 ENCOUNTER — Other Ambulatory Visit (HOSPITAL_BASED_OUTPATIENT_CLINIC_OR_DEPARTMENT_OTHER): Payer: BLUE CROSS/BLUE SHIELD

## 2017-01-31 ENCOUNTER — Ambulatory Visit (HOSPITAL_BASED_OUTPATIENT_CLINIC_OR_DEPARTMENT_OTHER): Payer: BLUE CROSS/BLUE SHIELD | Admitting: Oncology

## 2017-01-31 ENCOUNTER — Ambulatory Visit (HOSPITAL_BASED_OUTPATIENT_CLINIC_OR_DEPARTMENT_OTHER): Payer: BLUE CROSS/BLUE SHIELD

## 2017-01-31 ENCOUNTER — Ambulatory Visit: Payer: BLUE CROSS/BLUE SHIELD

## 2017-01-31 VITALS — BP 167/68 | HR 91 | Temp 98.4°F | Resp 18 | Ht 63.25 in | Wt 214.7 lb

## 2017-01-31 DIAGNOSIS — C50212 Malignant neoplasm of upper-inner quadrant of left female breast: Secondary | ICD-10-CM

## 2017-01-31 DIAGNOSIS — Z5111 Encounter for antineoplastic chemotherapy: Secondary | ICD-10-CM | POA: Diagnosis not present

## 2017-01-31 DIAGNOSIS — D649 Anemia, unspecified: Secondary | ICD-10-CM | POA: Diagnosis not present

## 2017-01-31 DIAGNOSIS — Z170 Estrogen receptor positive status [ER+]: Secondary | ICD-10-CM

## 2017-01-31 LAB — COMPREHENSIVE METABOLIC PANEL
ALT: 14 U/L (ref 0–55)
AST: 14 U/L (ref 5–34)
Albumin: 3 g/dL — ABNORMAL LOW (ref 3.5–5.0)
Alkaline Phosphatase: 90 U/L (ref 40–150)
Anion Gap: 10 mEq/L (ref 3–11)
BILIRUBIN TOTAL: 0.22 mg/dL (ref 0.20–1.20)
BUN: 28.5 mg/dL — AB (ref 7.0–26.0)
CHLORIDE: 104 meq/L (ref 98–109)
CO2: 25 meq/L (ref 22–29)
CREATININE: 1.9 mg/dL — AB (ref 0.6–1.1)
Calcium: 9.1 mg/dL (ref 8.4–10.4)
EGFR: 35 mL/min/{1.73_m2} — ABNORMAL LOW (ref >90)
Glucose: 291 mg/dl — ABNORMAL HIGH (ref 70–140)
Potassium: 3.9 mEq/L (ref 3.5–5.1)
Sodium: 139 mEq/L (ref 136–145)
TOTAL PROTEIN: 7.2 g/dL (ref 6.4–8.3)

## 2017-01-31 LAB — CBC WITH DIFFERENTIAL/PLATELET
BASO%: 0.3 % (ref 0.0–2.0)
Basophils Absolute: 0 10*3/uL (ref 0.0–0.1)
EOS%: 0.2 % (ref 0.0–7.0)
Eosinophils Absolute: 0 10*3/uL (ref 0.0–0.5)
HEMATOCRIT: 25.2 % — AB (ref 34.8–46.6)
HGB: 8.3 g/dL — ABNORMAL LOW (ref 11.6–15.9)
LYMPH#: 1.2 10*3/uL (ref 0.9–3.3)
LYMPH%: 19.6 % (ref 14.0–49.7)
MCH: 29.6 pg (ref 25.1–34.0)
MCHC: 33 g/dL (ref 31.5–36.0)
MCV: 89.8 fL (ref 79.5–101.0)
MONO#: 0.5 10*3/uL (ref 0.1–0.9)
MONO%: 9.2 % (ref 0.0–14.0)
NEUT#: 4.2 10*3/uL (ref 1.5–6.5)
NEUT%: 70.7 % (ref 38.4–76.8)
PLATELETS: 221 10*3/uL (ref 145–400)
RBC: 2.81 10*6/uL — AB (ref 3.70–5.45)
RDW: 13.4 % (ref 11.2–14.5)
WBC: 6 10*3/uL (ref 3.9–10.3)

## 2017-01-31 MED ORDER — SODIUM CHLORIDE 0.9 % IV SOLN
600.0000 mg/m2 | Freq: Once | INTRAVENOUS | Status: AC
  Administered 2017-01-31: 1280 mg via INTRAVENOUS
  Filled 2017-01-31: qty 64

## 2017-01-31 MED ORDER — PALONOSETRON HCL INJECTION 0.25 MG/5ML
0.2500 mg | Freq: Once | INTRAVENOUS | Status: AC
  Administered 2017-01-31: 0.25 mg via INTRAVENOUS

## 2017-01-31 MED ORDER — PALONOSETRON HCL INJECTION 0.25 MG/5ML
INTRAVENOUS | Status: AC
  Filled 2017-01-31: qty 5

## 2017-01-31 MED ORDER — DOXORUBICIN HCL CHEMO IV INJECTION 2 MG/ML
60.0000 mg/m2 | Freq: Once | INTRAVENOUS | Status: AC
  Administered 2017-01-31: 128 mg via INTRAVENOUS
  Filled 2017-01-31: qty 64

## 2017-01-31 MED ORDER — HEPARIN SOD (PORK) LOCK FLUSH 100 UNIT/ML IV SOLN
500.0000 [IU] | Freq: Once | INTRAVENOUS | Status: AC | PRN
  Administered 2017-01-31: 500 [IU]
  Filled 2017-01-31: qty 5

## 2017-01-31 MED ORDER — SODIUM CHLORIDE 0.9% FLUSH
10.0000 mL | INTRAVENOUS | Status: DC | PRN
  Administered 2017-01-31: 10 mL
  Filled 2017-01-31: qty 10

## 2017-01-31 MED ORDER — SODIUM CHLORIDE 0.9 % IV SOLN
Freq: Once | INTRAVENOUS | Status: AC
  Administered 2017-01-31: 10:00:00 via INTRAVENOUS

## 2017-01-31 MED ORDER — FOSAPREPITANT DIMEGLUMINE INJECTION 150 MG
Freq: Once | INTRAVENOUS | Status: AC
  Administered 2017-01-31: 11:00:00 via INTRAVENOUS
  Filled 2017-01-31: qty 5

## 2017-01-31 NOTE — Progress Notes (Signed)
OK to treat per Dr Jana Hakim with CRT of 1.9

## 2017-01-31 NOTE — Progress Notes (Signed)
[place]  [phone] Fax:(336) [phone]     ID: Abigail Yoder DOB: [birth year]  MR#: [id number]  LOV#:[id number]  Patient Care Team: [name], MD as PCP - General (Internal Medicine) Alphonsa Overall, MD as Consulting Physician (General Surgery) Chauncey Cruel, MD as Consulting Physician (Oncology) Eppie Gibson, MD as Attending Physician (Radiation Oncology) Jacelyn Pi, MD as Consulting Physician (Endocrinology) Ardis Hughs, MD as Attending Physician (Urology) Chauncey Cruel, MD OTHER MD:  CHIEF COMPLAINT: Estrogen receptor positive breast cancer  CURRENT TREATMENT: adjuvant chemotherapy   BREAST CANCER HISTORY: From the original intake note:  Abigail Yoder had bilateral routine screening mammography with tomography at [facility] 11/14/2016. The breast density was category B. In the left breast upper inner quadrant there was a 1.5 cm high density mass which was further evaluated with ultrasonography 11/16/2016. This confirmed a 1.2 cm lobulated mass in the left breast upper inner quadrant. The left axilla was benign.  Biopsy of the left breast mass in question 11/21/2016 showed (SAA 18-59) invasive ductal carcinoma, grade 3, estrogen and progesterone receptor positive, HER-2 nonamplified, with a signals ratio of 1.05 and the number per cell 2.15, with an MIB-1 of 90%.  Her subsequent history is as detailed below  INTERVAL HISTORY: Abigail Yoder returns today for further evaluation and treatment of her estrogen receptor positive breast cancer. Today is day 1 cycle 2 of 4 planned cycles of cyclophosphamide and doxorubicin which receives every 14 days, be followed by weekly taxine treatments 12  She tells me she did "horrible" with her first cycle, although she has been doing better the last few days. She has more energy and better appetite. She tells me she developed a fever last week of 202, but she did not call us about that. She still has a bad taste in her  mouth. She never did have problems with nausea.  REVIEW OF SYSTEMS: Abigail Yoder has been having "crazy dreams". She is still working full time. She sleeps poorly. She has a little bit of a runny nose but no epistaxis. Her sugars have been up. She lost her hair and had her scalp shaved by her husband. She was able to "live throat" without crying, she tells me. A detailed review of systems today was otherwise noncontributory  PAST MEDICAL HISTORY: Past Medical History:  Diagnosis Date  . Anemia   . Diabetes mellitus   . Family history of breast cancer   . Hypertension   . Obesity     PAST SURGICAL HISTORY: Past Surgical History:  Procedure Laterality Date  . APPENDECTOMY    . BREAST LUMPECTOMY WITH RADIOACTIVE SEED AND SENTINEL LYMPH NODE BIOPSY Left 12/31/2016   Procedure: BREAST LUMPECTOMY WITH RADIOACTIVE SEED AND SENTINEL LYMPH NODE BIOPSY;  Surgeon: Alphonsa Overall, MD;  Location: [place];  Service: General;  Laterality: Left;  . CERVICAL SPINE SURGERY    . CESAREAN SECTION    . KNEE SURGERY Left   . PORTACATH PLACEMENT N/A 12/31/2016   Procedure: INSERTION PORT-A-CATH WITH [place];  Surgeon: Alphonsa Overall, MD;  Location: [place];  Service: General;  Laterality: N/A;  . TUBAL LIGATION      FAMILY HISTORY Family History  Problem Relation Age of Onset  . Diabetes Mother   . Arthritis Mother   . Hypertension Father   . Heart disease Father   . Hyperlipidemia Father   . Breast cancer Sister 87    came back 17 years later  . Stroke Maternal Uncle   .  Stroke Maternal Grandmother   The patient's father died at the age of 22 from a brain aneurysm. The patient's mother died at age 79 following total hip replacement. The patient had 4 brothers, 5 sisters. One sister was diagnosed with breast cancer at age 5. There is no history of ovarian cancer in the family.  GYNECOLOGIC HISTORY:  No LMP recorded. Patient is not currently having periods (Reason:  Perimenopausal). Menarche age 52, first live birth age 41, the patient is [place] P2. She is perimenopausal and her most recent period was October 2017. She never used oral contraceptives.  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 Her husband [name] works in a Proofreader. Daughter Abigail Yoder works for the [employer] in [place]. Son show more right works for the Glass blower/designer at [employer] in [place]. The patient has 2 grandchildren. She attends a local [place]: Not in place   HEALTH MAINTENANCE: Social History  Substance Use Topics  . Smoking status: Former Smoker    Quit date: 11/19/1994  . Smokeless tobacco: Never Used  . Alcohol use No     Colonoscopy:Never  PAP:2015  Bone density:Never   No Known Allergies  Current Outpatient Prescriptions  Medication Sig Dispense Refill  . amLODipine (NORVASC) 5 MG tablet Take 5 mg by mouth daily.    . ciprofloxacin (CIPRO) 500 MG tablet Take 1 tablet (500 mg total) by mouth 2 (two) times daily. 10 tablet 0  . dexamethasone (DECADRON) 4 MG tablet Take 2 tablets by mouth once a day on the day after chemotherapy and then take 2 tablets two times a day for 2 days. Take with food. 30 tablet 1  . insulin aspart (NOVOLOG PENFILL) cartridge 15 units sQ at breakfast, 10 units at lunch, 15 units at dinner 15 mL 11  . lidocaine-prilocaine (EMLA) cream Apply to affected area once 30 g 3  . LORazepam (ATIVAN) 0.5 MG tablet Take 1 tablet (0.5 mg total) by mouth every 6 (six) hours as needed (Nausea or vomiting). 30 tablet 0  . losartan-hydrochlorothiazide (HYZAAR) 100-25 MG tablet Take 1 tablet by mouth daily.    . metFORMIN (GLUCOPHAGE) 1000 MG tablet Take 1,000 mg by mouth 2 (two) times daily with a meal.    . NOVOLOG MIX 70/30 FLEXPEN (70-30) 100 UNIT/ML FlexPen 15UNITS IN AM/ 10 UNITS AT LUNCH/ 15 UNITS IN  PM THREE TIMES DAILY SUBCUTANEOUS 30 DAYS  6  . prochlorperazine (COMPAZINE) 10 MG tablet Take 1 tablet (10 mg total) by mouth every 6 (six) hours as needed (Nausea or vomiting). (Patient not taking: Reported on 01/24/2017) 30 tablet 1  . tamoxifen (NOLVADEX) 20 MG tablet Take 20 mg by mouth daily.     No current facility-administered medications for this visit.     OBJECTIVE: Middle-aged African-American woman in no acute distress Vitals:   01/31/17 0937  BP: (!) 167/68  Pulse: 91  Resp: 18  Temp: 98.4 F (36.9 C)     Body mass index is 37.73 kg/m.    ECOG FS:1 - Symptomatic but completely ambulatory   Sclerae unicteric, pupils round and equal Oropharynx clear and moist-- no thrush or other lesions No cervical or supraclavicular adenopathy Lungs no rales or rhonchi Heart regular rate and rhythm Abd soft, nontender, positive bowel sounds MSK no focal spinal tenderness, no upper extremity lymphedema Neuro: nonfocal, well oriented, appropriate affect Breasts: Deferred  LAB RESULTS:  CMP     Component Value Date/Time   NA 139 01/31/2017 0852   K 3.9 01/31/2017 0852   CL 108 09/03/2014 1014   CO2 25 01/31/2017 0852   GLUCOSE 291 (H) 01/31/2017 0852   GLUCOSE 333 (H) 09/05/2006 1306   BUN 28.5 (H) 01/31/2017 0852   CREATININE 1.9 (H) 01/31/2017 0852   CALCIUM 9.1 01/31/2017 0852   PROT 7.2 01/31/2017 0852   ALBUMIN 3.0 (L) 01/31/2017 0852   AST 14 01/31/2017 0852   ALT 14 01/31/2017 0852   ALKPHOS 90 01/31/2017 0852   BILITOT 0.22 01/31/2017 0852   GFRNONAA 100.30 12/05/2009 1629   GFRAA 102 12/05/2007 1444    INo results found for: SPEP, UPEP  Lab Results  Component Value Date   WBC 6.0 01/31/2017   NEUTROABS 4.2 01/31/2017   HGB 8.3 (L) 01/31/2017   HCT 25.2 (L) 01/31/2017   MCV 89.8 01/31/2017   PLT 221 01/31/2017      Chemistry      Component Value Date/Time   NA 139 01/31/2017 0852   K 3.9 01/31/2017 0852   CL 108 09/03/2014 1014   CO2 25  01/31/2017 0852   BUN 28.5 (H) 01/31/2017 0852   CREATININE 1.9 (H) 01/31/2017 0852      Component Value Date/Time   CALCIUM 9.1 01/31/2017 0852   ALKPHOS 90 01/31/2017 0852   AST 14 01/31/2017 0852   ALT 14 01/31/2017 0852   BILITOT 0.22 01/31/2017 0852       No results found for: LABCA2  No components found for: LABCA125  No results for input(s): INR in the last 168 hours.  Urinalysis    Component Value Date/Time   BILIRUBINUR n 06/09/2014 1651   PROTEINUR 1+ 06/09/2014 1651   UROBILINOGEN 0.2 06/09/2014 1651   NITRITE positive 06/09/2014 1651   LEUKOCYTESUR small (1+) 06/09/2014 1651     STUDIES: No results found.  ELIGIBLE FOR AVAILABLE RESEARCH PROTOCOL: No   ASSESSMENT: 51 y.o. [place] woman status post left breast upper inner quadrant biopsy 11/21/2016 for a clinical T1 cN0, stage IA invasive ductal carcinoma, grade 3, estrogen and progesterone receptor positive, HER-2 not amplified, with an MIB-1 of 90%  (1) Oncotype DX Score of 60 predicts a risk of recurrence outside the breast of 34% if the patient's only systemic therapy is tamoxifen for 5 years. On a similar database this risk drops to about 12% if chemotherapy is also given  (2) genetics testing sent 11/28/2016; The Breast/GYN gene panel offered by [facility] includes sequencing and rearrangement analysis for the following 23 genes:  ATM, BARD1, BRCA1, BRCA2, BRIP1, CDH1, CHEK2, EPCAM, FANCC, MLH1, MSH2, MSH6, MUTYH, NBN, NF1, PALB2, PMS2, POLD1, PTEN, RAD51C, RAD51D, RECQL, and TP53.   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    (3) status post left breast lumpectomy on 12/31/2016: invasive ductal carcinoma, 1.8cm, grade 3, margins negative, 1 SLN negative, ER+(80%), PR-(2%), Ki-67 90%, HER-2 negative (ratio 1.05).  T1cN0  (4) chemotherapy to consist of cyclophosphamide and doxorubicin in dose dense fashion  4 (started 01/17/17), followed by Abraxane weekly 12.   (5) adjuvant radiation to follow as appropriate.  PLAN: Elivia Is tolerating the chemotherapy moderately well. She is however becoming anemic and I don't think we are going to be able to keep the treatments at every 2 week cycles. Instead we are going to give her an extra  week after this treatment.   In other words instead of being treated 2 weeks from today she will be treated again in 3 weeks from today.  Her creatinine was up today. I do not have a clear explanation for that. We went ahead and gave her extra fluids. I made sure she is not taking any nonsteroidals and she tells me she is not. She is also not on any supplements. I encouraged her to keep herself well-hydrated over the next few days. We will recheck her creatinine at the next visit.  We will see her again in a week. Very likely we will put her on prophylactic Cipro at that time since her white cell count does tend to drop quite a bit. She then may wish to cancel the return appointment for the 29th since she will not be treated that day, instead she will be treated April 5. She really has an appointment scheduled for that day.  I think she is having some reflux problems and if that worsens we can certainly start her on Protonix or omeprazole. I again counseled her to please call us for a temperature above 100.  She understands the taste alteration is going to be persistent until several weeks after she finishes all chemotherapy.  She is doing remarkably well with the loss of her hair.  She will call with any other issues that may develop before her next visit. We're just getting   Chauncey Cruel, MD   01/31/2017 9:52 AM Medical Oncology and Hematology [facility] [address] [place], [place] [address] Tel. [phone]    Fax. [phone]

## 2017-01-31 NOTE — Patient Instructions (Signed)
Implanted Port Home Guide An implanted port is a type of central line that is placed under the skin. Central lines are used to provide IV access when treatment or nutrition needs to be given through a person's veins. Implanted ports are used for long-term IV access. An implanted port may be placed because:  You need IV medicine that would be irritating to the small veins in your hands or arms.  You need long-term IV medicines, such as antibiotics.  You need IV nutrition for a long period.  You need frequent blood draws for lab tests.  You need dialysis.  Implanted ports are usually placed in the chest area, but they can also be placed in the upper arm, the abdomen, or the leg. An implanted port has two main parts:  Reservoir. The reservoir is round and will appear as a small, raised area under your skin. The reservoir is the part where a needle is inserted to give medicines or draw blood.  Catheter. The catheter is a thin, flexible tube that extends from the reservoir. The catheter is placed into a large vein. Medicine that is inserted into the reservoir goes into the catheter and then into the vein.  How will I care for my incision site? Do not get the incision site wet. Bathe or shower as directed by your health care provider. How is my port accessed? Special steps must be taken to access the port:  Before the port is accessed, a numbing cream can be placed on the skin. This helps numb the skin over the port site.  Your health care provider uses a sterile technique to access the port. ? Your health care provider must put on a mask and sterile gloves. ? The skin over your port is cleaned carefully with an antiseptic and allowed to dry. ? The port is gently pinched between sterile gloves, and a needle is inserted into the port.  Only "non-coring" port needles should be used to access the port. Once the port is accessed, a blood return should be checked. This helps ensure that the port  is in the vein and is not clogged.  If your port needs to remain accessed for a constant infusion, a clear (transparent) bandage will be placed over the needle site. The bandage and needle will need to be changed every week, or as directed by your health care provider.  Keep the bandage covering the needle clean and dry. Do not get it wet. Follow your health care provider's instructions on how to take a shower or bath while the port is accessed.  If your port does not need to stay accessed, no bandage is needed over the port.  What is flushing? Flushing helps keep the port from getting clogged. Follow your health care provider's instructions on how and when to flush the port. Ports are usually flushed with saline solution or a medicine called heparin. The need for flushing will depend on how the port is used.  If the port is used for intermittent medicines or blood draws, the port will need to be flushed: ? After medicines have been given. ? After blood has been drawn. ? As part of routine maintenance.  If a constant infusion is running, the port may not need to be flushed.  How long will my port stay implanted? The port can stay in for as long as your health care provider thinks it is needed. When it is time for the port to come out, surgery will be   done to remove it. The procedure is similar to the one performed when the port was put in. When should I seek immediate medical care? When you have an implanted port, you should seek immediate medical care if:  You notice a bad smell coming from the incision site.  You have swelling, redness, or drainage at the incision site.  You have more swelling or pain at the port site or the surrounding area.  You have a fever that is not controlled with medicine.  This information is not intended to replace advice given to you by your health care provider. Make sure you discuss any questions you have with your health care provider. Document  Released: 11/05/2005 Document Revised: 04/12/2016 Document Reviewed: 07/13/2013 Elsevier Interactive Patient Education  2017 [employer]vier Inc[employer]  

## 2017-01-31 NOTE — Patient Instructions (Addendum)
[facility] Discharge Instructions for Patients Receiving Chemotherapy  Today you received the following chemotherapy agents:  Doxyrubin (adriamycin), Cytoxan (cyclophosphamide)  To help prevent nausea and vomiting after your treatment, we encourage you to take your nausea medication as prescribed.   If you develop nausea and vomiting that is not controlled by your nausea medication, call the clinic.   BELOW ARE SYMPTOMS THAT SHOULD BE REPORTED IMMEDIATELY:  *FEVER GREATER THAN 100.5 F  *CHILLS WITH OR WITHOUT FEVER  NAUSEA AND VOMITING THAT IS NOT CONTROLLED WITH YOUR NAUSEA MEDICATION  *UNUSUAL SHORTNESS OF BREATH  *UNUSUAL BRUISING OR BLEEDING  TENDERNESS IN MOUTH AND THROAT WITH OR WITHOUT PRESENCE OF ULCERS  *URINARY PROBLEMS  *BOWEL PROBLEMS  UNUSUAL RASH Items with * indicate a potential emergency and should be followed up as soon as possible.  Feel free to call the clinic you have any questions or concerns. The clinic phone number is [phone].  Please show the [place] at check-in to the Emergency Department and triage nurse.

## 2017-01-31 NOTE — Progress Notes (Signed)
Pt to receive 1 L IVF with pt infusion treatment today per Dr.[name]. Notified Nicole,RN in infusion and is aware.

## 2017-02-07 ENCOUNTER — Ambulatory Visit (HOSPITAL_BASED_OUTPATIENT_CLINIC_OR_DEPARTMENT_OTHER): Payer: BLUE CROSS/BLUE SHIELD | Admitting: Adult Health

## 2017-02-07 ENCOUNTER — Encounter: Payer: Self-pay | Admitting: *Deleted

## 2017-02-07 ENCOUNTER — Encounter: Payer: Self-pay | Admitting: Adult Health

## 2017-02-07 ENCOUNTER — Encounter (HOSPITAL_COMMUNITY): Payer: Self-pay

## 2017-02-07 ENCOUNTER — Ambulatory Visit: Payer: BLUE CROSS/BLUE SHIELD

## 2017-02-07 ENCOUNTER — Ambulatory Visit (HOSPITAL_BASED_OUTPATIENT_CLINIC_OR_DEPARTMENT_OTHER): Payer: BLUE CROSS/BLUE SHIELD

## 2017-02-07 ENCOUNTER — Inpatient Hospital Stay (HOSPITAL_COMMUNITY): Payer: BLUE CROSS/BLUE SHIELD

## 2017-02-07 ENCOUNTER — Inpatient Hospital Stay (HOSPITAL_COMMUNITY)
Admission: AD | Admit: 2017-02-07 | Discharge: 2017-02-14 | DRG: 810 | Disposition: A | Discharge status: Home / Self Care | Payer: BLUE CROSS/BLUE SHIELD | Source: Ambulatory Visit | Attending: Internal Medicine | Admitting: Internal Medicine

## 2017-02-07 ENCOUNTER — Other Ambulatory Visit (HOSPITAL_BASED_OUTPATIENT_CLINIC_OR_DEPARTMENT_OTHER): Payer: BLUE CROSS/BLUE SHIELD

## 2017-02-07 VITALS — BP 159/67 | HR 90 | Temp 100.8°F | Resp 18 | Ht 63.25 in | Wt 212.9 lb

## 2017-02-07 VITALS — BP 141/63 | HR 94 | Temp 100.1°F | Resp 20

## 2017-02-07 DIAGNOSIS — C50212 Malignant neoplasm of upper-inner quadrant of left female breast: Secondary | ICD-10-CM

## 2017-02-07 DIAGNOSIS — Z794 Long term (current) use of insulin: Secondary | ICD-10-CM | POA: Diagnosis not present

## 2017-02-07 DIAGNOSIS — E119 Type 2 diabetes mellitus without complications: Secondary | ICD-10-CM | POA: Diagnosis not present

## 2017-02-07 DIAGNOSIS — T451X5A Adverse effect of antineoplastic and immunosuppressive drugs, initial encounter: Secondary | ICD-10-CM | POA: Diagnosis not present

## 2017-02-07 DIAGNOSIS — D709 Neutropenia, unspecified: Secondary | ICD-10-CM | POA: Diagnosis present

## 2017-02-07 DIAGNOSIS — Z8249 Family history of ischemic heart disease and other diseases of the circulatory system: Secondary | ICD-10-CM

## 2017-02-07 DIAGNOSIS — D509 Iron deficiency anemia, unspecified: Secondary | ICD-10-CM | POA: Diagnosis not present

## 2017-02-07 DIAGNOSIS — Z833 Family history of diabetes mellitus: Secondary | ICD-10-CM

## 2017-02-07 DIAGNOSIS — Z79899 Other long term (current) drug therapy: Secondary | ICD-10-CM

## 2017-02-07 DIAGNOSIS — I10 Essential (primary) hypertension: Secondary | ICD-10-CM | POA: Diagnosis not present

## 2017-02-07 DIAGNOSIS — R5081 Fever presenting with conditions classified elsewhere: Secondary | ICD-10-CM

## 2017-02-07 DIAGNOSIS — Z7952 Long term (current) use of systemic steroids: Secondary | ICD-10-CM

## 2017-02-07 DIAGNOSIS — Z1379 Encounter for other screening for genetic and chromosomal anomalies: Secondary | ICD-10-CM

## 2017-02-07 DIAGNOSIS — Z803 Family history of malignant neoplasm of breast: Secondary | ICD-10-CM

## 2017-02-07 DIAGNOSIS — D701 Agranulocytosis secondary to cancer chemotherapy: Secondary | ICD-10-CM | POA: Diagnosis not present

## 2017-02-07 DIAGNOSIS — Z87891 Personal history of nicotine dependence: Secondary | ICD-10-CM

## 2017-02-07 DIAGNOSIS — D61810 Antineoplastic chemotherapy induced pancytopenia: Secondary | ICD-10-CM

## 2017-02-07 DIAGNOSIS — Z95828 Presence of other vascular implants and grafts: Secondary | ICD-10-CM | POA: Diagnosis not present

## 2017-02-07 DIAGNOSIS — Z79810 Long term (current) use of selective estrogen receptor modulators (SERMs): Secondary | ICD-10-CM | POA: Diagnosis not present

## 2017-02-07 DIAGNOSIS — M5000 Cervical disc disorder with myelopathy, unspecified cervical region: Secondary | ICD-10-CM

## 2017-02-07 DIAGNOSIS — Z823 Family history of stroke: Secondary | ICD-10-CM | POA: Diagnosis not present

## 2017-02-07 DIAGNOSIS — Z170 Estrogen receptor positive status [ER+]: Secondary | ICD-10-CM | POA: Diagnosis not present

## 2017-02-07 DIAGNOSIS — E669 Obesity, unspecified: Secondary | ICD-10-CM

## 2017-02-07 DIAGNOSIS — K0889 Other specified disorders of teeth and supporting structures: Secondary | ICD-10-CM | POA: Diagnosis not present

## 2017-02-07 DIAGNOSIS — Z5181 Encounter for therapeutic drug level monitoring: Secondary | ICD-10-CM | POA: Diagnosis not present

## 2017-02-07 DIAGNOSIS — A599 Trichomoniasis, unspecified: Secondary | ICD-10-CM

## 2017-02-07 DIAGNOSIS — Z6830 Body mass index (BMI) 30.0-30.9, adult: Secondary | ICD-10-CM

## 2017-02-07 DIAGNOSIS — C50412 Malignant neoplasm of upper-outer quadrant of left female breast: Secondary | ICD-10-CM | POA: Diagnosis not present

## 2017-02-07 DIAGNOSIS — R509 Fever, unspecified: Secondary | ICD-10-CM

## 2017-02-07 DIAGNOSIS — Z8261 Family history of arthritis: Secondary | ICD-10-CM | POA: Diagnosis not present

## 2017-02-07 LAB — URINALYSIS, MICROSCOPIC - CHCC
Bilirubin (Urine): NEGATIVE
Glucose: NEGATIVE mg/dL
Ketones: NEGATIVE mg/dL
LEUKOCYTE ESTERASE: NEGATIVE
NITRITE: NEGATIVE
PH: 6.5 (ref 4.6–8.0)
Protein: 100 mg/dL
SPECIFIC GRAVITY, URINE: 1.01 (ref 1.003–1.035)
UROBILINOGEN UR: 0.2 mg/dL (ref 0.2–1)
WBC, UA: NEGATIVE (ref 0–?)

## 2017-02-07 LAB — GLUCOSE, CAPILLARY
GLUCOSE-CAPILLARY: 126 mg/dL — AB (ref 65–99)
GLUCOSE-CAPILLARY: 133 mg/dL — AB (ref 65–99)
Glucose-Capillary: 95 mg/dL (ref 65–99)

## 2017-02-07 LAB — CBC WITH DIFFERENTIAL/PLATELET
BASO%: 3.3 % — ABNORMAL HIGH (ref 0.0–2.0)
Basophils Absolute: 0 10*3/uL (ref 0.0–0.1)
EOS ABS: 0 10*3/uL (ref 0.0–0.5)
EOS%: 0 % (ref 0.0–7.0)
HEMATOCRIT: 23.6 % — AB (ref 34.8–46.6)
HGB: 7.8 g/dL — ABNORMAL LOW (ref 11.6–15.9)
LYMPH%: 96.7 % — AB (ref 14.0–49.7)
MCH: 28.8 pg (ref 25.1–34.0)
MCHC: 33.1 g/dL (ref 31.5–36.0)
MCV: 87.1 fL (ref 79.5–101.0)
MONO#: 0 10*3/uL — ABNORMAL LOW (ref 0.1–0.9)
MONO%: 0 % (ref 0.0–14.0)
NEUT%: 0 % — ABNORMAL LOW (ref 38.4–76.8)
NEUTROS ABS: 0 10*3/uL — AB (ref 1.5–6.5)
Platelets: 198 10*3/uL (ref 145–400)
RBC: 2.71 10*6/uL — AB (ref 3.70–5.45)
RDW: 13.5 % (ref 11.2–14.5)
WBC: 0.3 10*3/uL — AB (ref 3.9–10.3)
lymph#: 0.3 10*3/uL — ABNORMAL LOW (ref 0.9–3.3)
nRBC: 0 % (ref 0–0)

## 2017-02-07 LAB — COMPREHENSIVE METABOLIC PANEL
ALT: 14 U/L (ref 0–55)
ANION GAP: 10 meq/L (ref 3–11)
AST: 13 U/L (ref 5–34)
Albumin: 2.8 g/dL — ABNORMAL LOW (ref 3.5–5.0)
Alkaline Phosphatase: 71 U/L (ref 40–150)
BILIRUBIN TOTAL: 0.49 mg/dL (ref 0.20–1.20)
BUN: 23 mg/dL (ref 7.0–26.0)
CO2: 24 meq/L (ref 22–29)
CREATININE: 1.3 mg/dL — AB (ref 0.6–1.1)
Calcium: 8.1 mg/dL — ABNORMAL LOW (ref 8.4–10.4)
Chloride: 106 mEq/L (ref 98–109)
EGFR: 55 mL/min/{1.73_m2} — ABNORMAL LOW (ref >90)
Glucose: 162 mg/dl — ABNORMAL HIGH (ref 70–140)
Potassium: 3.9 mEq/L (ref 3.5–5.1)
Sodium: 140 mEq/L (ref 136–145)
TOTAL PROTEIN: 6.6 g/dL (ref 6.4–8.3)

## 2017-02-07 MED ORDER — ACETAMINOPHEN 325 MG PO TABS
650.0000 mg | ORAL_TABLET | ORAL | Status: DC | PRN
  Administered 2017-02-07 – 2017-02-10 (×5): 650 mg via ORAL
  Filled 2017-02-07 (×5): qty 2

## 2017-02-07 MED ORDER — HYDROCHLOROTHIAZIDE 25 MG PO TABS
25.0000 mg | ORAL_TABLET | Freq: Every day | ORAL | Status: DC
  Administered 2017-02-08 – 2017-02-09 (×2): 25 mg via ORAL
  Filled 2017-02-07 (×2): qty 1

## 2017-02-07 MED ORDER — TAMOXIFEN CITRATE 10 MG PO TABS
20.0000 mg | ORAL_TABLET | Freq: Every day | ORAL | Status: DC
  Filled 2017-02-07 (×3): qty 2

## 2017-02-07 MED ORDER — LOSARTAN POTASSIUM-HCTZ 100-25 MG PO TABS: 1.0000 | ORAL_TABLET | Freq: Every day | ORAL | Status: DC

## 2017-02-07 MED ORDER — LORAZEPAM 0.5 MG PO TABS: 0.5000 mg | ORAL_TABLET | Freq: Four times a day (QID) | ORAL | Status: DC | PRN

## 2017-02-07 MED ORDER — SODIUM CHLORIDE 0.9 % IV BOLUS (SEPSIS)
500.0000 mL | Freq: Once | INTRAVENOUS | Status: AC
  Administered 2017-02-07: 500 mL via INTRAVENOUS

## 2017-02-07 MED ORDER — LOSARTAN POTASSIUM 50 MG PO TABS
100.0000 mg | ORAL_TABLET | Freq: Every day | ORAL | Status: DC
  Administered 2017-02-08 – 2017-02-09 (×2): 100 mg via ORAL
  Filled 2017-02-07 (×2): qty 2

## 2017-02-07 MED ORDER — ENOXAPARIN SODIUM 40 MG/0.4ML ~~LOC~~ SOLN
40.0000 mg | SUBCUTANEOUS | Status: DC
  Administered 2017-02-07: 40 mg via SUBCUTANEOUS
  Filled 2017-02-07 (×3): qty 0.4

## 2017-02-07 MED ORDER — ONDANSETRON HCL 4 MG/2ML IJ SOLN: 4.0000 mg | Freq: Four times a day (QID) | INTRAMUSCULAR | Status: DC | PRN

## 2017-02-07 MED ORDER — DEXTROSE 5 % IV SOLN
2.0000 g | Freq: Once | INTRAVENOUS | Status: AC
  Administered 2017-02-07: 2 g via INTRAVENOUS
  Filled 2017-02-07: qty 2

## 2017-02-07 MED ORDER — ACETAMINOPHEN 325 MG PO TABS
ORAL_TABLET | ORAL | Status: AC
  Filled 2017-02-07: qty 2

## 2017-02-07 MED ORDER — TAMOXIFEN CITRATE 20 MG PO TABS: 20.0000 mg | ORAL_TABLET | Freq: Every day | ORAL | Status: DC

## 2017-02-07 MED ORDER — SODIUM CHLORIDE 0.9% FLUSH
10.0000 mL | INTRAVENOUS | Status: DC | PRN
  Administered 2017-02-07: 10 mL via INTRAVENOUS
  Filled 2017-02-07: qty 10

## 2017-02-07 MED ORDER — INSULIN ASPART 100 UNIT/ML ~~LOC~~ SOLN
0.0000 [IU] | Freq: Every day | SUBCUTANEOUS | Status: DC
  Administered 2017-02-10: 2 [IU] via SUBCUTANEOUS
  Administered 2017-02-11: 3 [IU] via SUBCUTANEOUS
  Administered 2017-02-13: 2 [IU] via SUBCUTANEOUS

## 2017-02-07 MED ORDER — ACETAMINOPHEN 325 MG PO TABS
650.0000 mg | ORAL_TABLET | Freq: Four times a day (QID) | ORAL | Status: DC | PRN
  Administered 2017-02-07: 650 mg via ORAL

## 2017-02-07 MED ORDER — PROCHLORPERAZINE MALEATE 10 MG PO TABS: 10.0000 mg | ORAL_TABLET | Freq: Four times a day (QID) | ORAL | Status: DC | PRN

## 2017-02-07 MED ORDER — ONDANSETRON HCL 4 MG PO TABS: 4.0000 mg | ORAL_TABLET | Freq: Four times a day (QID) | ORAL | Status: DC | PRN

## 2017-02-07 MED ORDER — DEXTROSE 5 % IV SOLN
2.0000 g | Freq: Three times a day (TID) | INTRAVENOUS | Status: DC
  Administered 2017-02-07 – 2017-02-11 (×12): 2 g via INTRAVENOUS
  Filled 2017-02-07 (×13): qty 2

## 2017-02-07 MED ORDER — AMLODIPINE BESYLATE 5 MG PO TABS
5.0000 mg | ORAL_TABLET | Freq: Every day | ORAL | Status: DC
  Administered 2017-02-08 – 2017-02-09 (×2): 5 mg via ORAL
  Filled 2017-02-07 (×2): qty 1

## 2017-02-07 MED ORDER — VANCOMYCIN HCL 10 G IV SOLR
1500.0000 mg | INTRAVENOUS | Status: DC
  Administered 2017-02-07: 1500 mg via INTRAVENOUS
  Filled 2017-02-07 (×2): qty 1500

## 2017-02-07 MED ORDER — INSULIN DETEMIR 100 UNIT/ML ~~LOC~~ SOLN
12.0000 [IU] | Freq: Every day | SUBCUTANEOUS | Status: DC
  Administered 2017-02-07 – 2017-02-09 (×3): 12 [IU] via SUBCUTANEOUS
  Filled 2017-02-07 (×4): qty 0.12

## 2017-02-07 MED ORDER — ENSURE ENLIVE PO LIQD
237.0000 mL | Freq: Two times a day (BID) | ORAL | Status: DC
  Administered 2017-02-08 – 2017-02-10 (×3): 237 mL via ORAL

## 2017-02-07 MED ORDER — FILGRASTIM 480 MCG/0.8ML IJ SOSY: 480.0000 ug | PREFILLED_SYRINGE | Freq: Once | INTRAMUSCULAR | Status: DC

## 2017-02-07 MED ORDER — INSULIN ASPART 100 UNIT/ML ~~LOC~~ SOLN
0.0000 [IU] | Freq: Three times a day (TID) | SUBCUTANEOUS | Status: DC
  Administered 2017-02-07: 2 [IU] via SUBCUTANEOUS
  Administered 2017-02-08 (×2): 3 [IU] via SUBCUTANEOUS
  Administered 2017-02-09 (×2): 2 [IU] via SUBCUTANEOUS
  Administered 2017-02-09 – 2017-02-10 (×2): 3 [IU] via SUBCUTANEOUS
  Administered 2017-02-10: 8 [IU] via SUBCUTANEOUS
  Administered 2017-02-10: 3 [IU] via SUBCUTANEOUS
  Administered 2017-02-11: 8 [IU] via SUBCUTANEOUS
  Administered 2017-02-11: 5 [IU] via SUBCUTANEOUS
  Administered 2017-02-11 – 2017-02-12 (×2): 3 [IU] via SUBCUTANEOUS
  Administered 2017-02-12: 8 [IU] via SUBCUTANEOUS
  Administered 2017-02-13 (×2): 3 [IU] via SUBCUTANEOUS

## 2017-02-07 MED ORDER — TBO-FILGRASTIM 480 MCG/0.8ML ~~LOC~~ SOSY
480.0000 ug | PREFILLED_SYRINGE | Freq: Once | SUBCUTANEOUS | Status: AC
  Administered 2017-02-07: 480 ug via SUBCUTANEOUS
  Filled 2017-02-07: qty 0.8

## 2017-02-07 NOTE — Progress Notes (Signed)
[place]  Telephone:(336) [phone] Fax:(336) [phone]     ID: Abigail Yoder DOB: 1966-07-16  MR#: [id number]  XBM#:[id number]  Patient Care Team: [name], MD as PCP - General (Internal Medicine) Alphonsa Overall, MD as Consulting Physician (General Surgery) Chauncey Cruel, MD as Consulting Physician (Oncology) Eppie Gibson, MD as Attending Physician (Radiation Oncology) Jacelyn Pi, MD as Consulting Physician (Endocrinology) Ardis Hughs, MD as Attending Physician (Urology) Scot Dock, NP OTHER MD:  CHIEF COMPLAINT: Estrogen receptor positive breast cancer  CURRENT TREATMENT: adjuvant chemotherapy   BREAST CANCER HISTORY: From the original intake note:  Abigail Yoder had bilateral routine screening mammography with tomography at [facility] 11/14/2016. The breast density was category B. In the left breast upper inner quadrant there was a 1.5 cm high density mass which was further evaluated with ultrasonography 11/16/2016. This confirmed a 1.2 cm lobulated mass in the left breast upper inner quadrant. The left axilla was benign.  Biopsy of the left breast mass in question 11/21/2016 showed (SAA 18-59) invasive ductal carcinoma, grade 3, estrogen and progesterone receptor positive, HER-2 nonamplified, with a signals ratio of 1.05 and the number per cell 2.15, with an MIB-1 of 90%.  Her subsequent history is as detailed below  INTERVAL HISTORY: Abigail Yoder is here today on cycle 2 day 8 of Doxorubicin and Cyclophosphamide.  She unfortunately did not receive neulasta after treatment.  She is neutropenic today and has a fever of 100.8.  She reports fatigue, and mouth pain.  She says she doesn't see any ulcerations in her mouth.    REVIEW OF SYSTEMS: She denies any nausea/vomiting, and says her reflux is improved from last week.  No peripheral neuropathy, constipation, diarrhea, or any other concerns.    PAST MEDICAL HISTORY: Past Medical History:  Diagnosis Date  .  Anemia   . Diabetes mellitus   . Family history of breast cancer   . Hypertension   . Obesity     PAST SURGICAL HISTORY: Past Surgical History:  Procedure Laterality Date  . APPENDECTOMY    . BREAST LUMPECTOMY WITH RADIOACTIVE SEED AND SENTINEL LYMPH NODE BIOPSY Left 12/31/2016   Procedure: BREAST LUMPECTOMY WITH RADIOACTIVE SEED AND SENTINEL LYMPH NODE BIOPSY;  Surgeon: Alphonsa Overall, MD;  Location: [place];  Service: General;  Laterality: Left;  . CERVICAL SPINE SURGERY    . CESAREAN SECTION    . KNEE SURGERY Left   . PORTACATH PLACEMENT N/A 12/31/2016   Procedure: INSERTION PORT-A-CATH WITH [place];  Surgeon: Alphonsa Overall, MD;  Location: [place];  Service: General;  Laterality: N/A;  . TUBAL LIGATION      FAMILY HISTORY Family History  Problem Relation Age of Onset  . Diabetes Mother   . Arthritis Mother   . Hypertension Father   . Heart disease Father   . Hyperlipidemia Father   . Breast cancer Sister 55    came back 17 years later  . Stroke Maternal Uncle   . Stroke Maternal Grandmother   The patient's father died at the age of 37 from a brain aneurysm. The patient's mother died at age 52 following total hip replacement. The patient had 4 brothers, 5 sisters. One sister was diagnosed with breast cancer at age 52. There is no history of ovarian cancer in the family.  GYNECOLOGIC HISTORY:  No LMP recorded. Patient is not currently having periods (Reason: Perimenopausal). Menarche age 28, first live birth age 40, the patient is [place] P2. She is perimenopausal  and her most recent period was October 2017. She never used oral contraceptives.  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 Her husband [name] works in a Proofreader. Daughter [name] works for the [employer] in [place]. Son show more right works for the Glass blower/designer at [employer] in  [place]. The patient has 2 grandchildren. She attends a local [place]: Not in place   HEALTH MAINTENANCE: Social History  Substance Use Topics  . Smoking status: Former Smoker    Quit date: 11/19/1994  . Smokeless tobacco: Never Used  . Alcohol use No     Colonoscopy:Never  PAP:2015  Bone density:Never   No Known Allergies  Current Outpatient Prescriptions  Medication Sig Dispense Refill  . amLODipine (NORVASC) 5 MG tablet Take 5 mg by mouth daily.    . ciprofloxacin (CIPRO) 500 MG tablet Take 1 tablet (500 mg total) by mouth 2 (two) times daily. 10 tablet 0  . dexamethasone (DECADRON) 4 MG tablet Take 2 tablets by mouth once a day on the day after chemotherapy and then take 2 tablets two times a day for 2 days. Take with food. 30 tablet 1  . insulin aspart (NOVOLOG PENFILL) cartridge 15 units sQ at breakfast, 10 units at lunch, 15 units at dinner 15 mL 11  . lidocaine-prilocaine (EMLA) cream Apply to affected area once 30 g 3  . LORazepam (ATIVAN) 0.5 MG tablet Take 1 tablet (0.5 mg total) by mouth every 6 (six) hours as needed (Nausea or vomiting). 30 tablet 0  . losartan-hydrochlorothiazide (HYZAAR) 100-25 MG tablet Take 1 tablet by mouth daily.    . metFORMIN (GLUCOPHAGE) 1000 MG tablet Take 1,000 mg by mouth 2 (two) times daily with a meal.    . NOVOLOG MIX 70/30 FLEXPEN (70-30) 100 UNIT/ML FlexPen 15UNITS IN AM/ 10 UNITS AT LUNCH/ 15 UNITS IN PM THREE TIMES DAILY SUBCUTANEOUS 30 DAYS  6  . prochlorperazine (COMPAZINE) 10 MG tablet Take 1 tablet (10 mg total) by mouth every 6 (six) hours as needed (Nausea or vomiting). (Patient not taking: Reported on 01/24/2017) 30 tablet 1  . tamoxifen (NOLVADEX) 20 MG tablet Take 20 mg by mouth daily.     No current facility-administered medications for this visit.     OBJECTIVE: Middle-aged African-American woman in no acute distress There were no vitals filed for this visit.   There is  no height or weight on file to calculate BMI.    ECOG FS:1 - Symptomatic but completely ambulatory  GENERAL: Patient is a well appearing female in no acute distress HEENT:  Sclerae anicteric. PERRL  Two tiny ulcerations on upper palate near front teeth. No evidence of oropharyngeal candidiasis. Neck is supple.  NODES:  No cervical, supraclavicular, infraclavicular, or axillary lymphadenopathy palpated.  BREAST EXAM:  Deferred. LUNGS:  Clear to auscultation bilaterally.  No wheezes or rhonchi. HEART:  Regular rate and rhythm. No murmur appreciated. ABDOMEN:  Soft, nontender.  Positive, normoactive bowel sounds. No organomegaly palpated. MSK:  No focal spinal tenderness to palpation.  EXTREMITIES:  No peripheral edema.   SKIN:  Clear with no obvious rashes or skin changes. No nail dyscrasia. NEURO:  Nonfocal. Well oriented.  Appropriate affect.   LAB RESULTS:  CMP     Component Value Date/Time   NA 139 01/31/2017 0852   K 3.9 01/31/2017 0852   CL 108 09/03/2014 1014  CO2 25 01/31/2017 0852   GLUCOSE 291 (H) 01/31/2017 0852   GLUCOSE 333 (H) 09/05/2006 1306   BUN 28.5 (H) 01/31/2017 0852   CREATININE 1.9 (H) 01/31/2017 0852   CALCIUM 9.1 01/31/2017 0852   PROT 7.2 01/31/2017 0852   ALBUMIN 3.0 (L) 01/31/2017 0852   AST 14 01/31/2017 0852   ALT 14 01/31/2017 0852   ALKPHOS 90 01/31/2017 0852   BILITOT 0.22 01/31/2017 0852   GFRNONAA 100.30 12/05/2009 1629   GFRAA 102 12/05/2007 1444    INo results found for: SPEP, UPEP  Lab Results  Component Value Date   WBC 6.0 01/31/2017   NEUTROABS 4.2 01/31/2017   HGB 8.3 (L) 01/31/2017   HCT 25.2 (L) 01/31/2017   MCV 89.8 01/31/2017   PLT 221 01/31/2017      Chemistry      Component Value Date/Time   NA 139 01/31/2017 0852   K 3.9 01/31/2017 0852   CL 108 09/03/2014 1014   CO2 25 01/31/2017 0852   BUN 28.5 (H) 01/31/2017 0852   CREATININE 1.9 (H) 01/31/2017 0852      Component Value Date/Time   CALCIUM 9.1 01/31/2017  0852   ALKPHOS 90 01/31/2017 0852   AST 14 01/31/2017 0852   ALT 14 01/31/2017 0852   BILITOT 0.22 01/31/2017 0852       No results found for: LABCA2  No components found for: LABCA125  No results for input(s): INR in the last 168 hours.  Urinalysis    Component Value Date/Time   BILIRUBINUR n 06/09/2014 1651   PROTEINUR 1+ 06/09/2014 1651   UROBILINOGEN 0.2 06/09/2014 1651   NITRITE positive 06/09/2014 1651   LEUKOCYTESUR small (1+) 06/09/2014 1651     STUDIES: No results found.  ELIGIBLE FOR AVAILABLE RESEARCH PROTOCOL: No   ASSESSMENT: 51 y.o. [place] woman status post left breast upper inner quadrant biopsy 11/21/2016 for a clinical T1 cN0, stage IA invasive ductal carcinoma, grade 3, estrogen and progesterone receptor positive, HER-2 not amplified, with an MIB-1 of 90%  (1) Oncotype DX Score of 60 predicts a risk of recurrence outside the breast of 34% if the patient's only systemic therapy is tamoxifen for 5 years. On a similar database this risk drops to about 12% if chemotherapy is also given  (2) genetics testing sent 11/28/2016; The Breast/GYN gene panel offered by [facility] includes sequencing and rearrangement analysis for the following 23 genes:  ATM, BARD1, BRCA1, BRCA2, BRIP1, CDH1, CHEK2, EPCAM, FANCC, MLH1, MSH2, MSH6, MUTYH, NBN, NF1, PALB2, PMS2, POLD1, PTEN, RAD51C, RAD51D, RECQL, and TP53.   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    (3) status post left breast lumpectomy on 12/31/2016: invasive ductal carcinoma, 1.8cm, grade 3, margins negative, 1 SLN negative, ER+(80%), PR-(2%), Ki-67 90%, HER-2 negative (ratio 1.05).  T1cN0  (4) chemotherapy to consist of cyclophosphamide and doxorubicin in dose dense fashion 4 (started 01/17/17), followed by Abraxane weekly 12.   (5) adjuvant radiation to follow as appropriate.  PLAN:  Tejah has febrile  neutropenia.  She has undergone full fever work up with blood cultures x 2.  A urinalysis was ordered.  She will also need a chest xray, but that can be done later as I don't want it to interfere with her receiving antibiotics ASAP.  She will receive 2g IV rocephin.  She will also need Neupogen 478mg subcutaneous daily x 3 due to the fact  that she did not receive Neulasta last week.  She received her first dose of neupogen here today prior to admission, given per Armanda Magic, RN.  She will be admitted by Dr. Cruzita Lederer, and I discussed this patient with him personally.  She is anemic, and I told her about the possibility of receiving a transfusion.    We will touch base with her while she is in the hospital.    A total of (60) minutes of face-to-face time was spent with this patient with greater than 50% of that time in counseling and care-coordination.    Scot Dock, NP   02/07/2017 8:27 AM Medical Oncology and Hematology [facility] [address] [place], [place] [address] Tel. [phone]    Fax. [phone]

## 2017-02-07 NOTE — Patient Instructions (Signed)
Implanted Port Home Guide An implanted port is a type of central line that is placed under the skin. Central lines are used to provide IV access when treatment or nutrition needs to be given through a person's veins. Implanted ports are used for long-term IV access. An implanted port may be placed because:  You need IV medicine that would be irritating to the small veins in your hands or arms.  You need long-term IV medicines, such as antibiotics.  You need IV nutrition for a long period.  You need frequent blood draws for lab tests.  You need dialysis.  Implanted ports are usually placed in the chest area, but they can also be placed in the upper arm, the abdomen, or the leg. An implanted port has two main parts:  Reservoir. The reservoir is round and will appear as a small, raised area under your skin. The reservoir is the part where a needle is inserted to give medicines or draw blood.  Catheter. The catheter is a thin, flexible tube that extends from the reservoir. The catheter is placed into a large vein. Medicine that is inserted into the reservoir goes into the catheter and then into the vein.  How will I care for my incision site? Do not get the incision site wet. Bathe or shower as directed by your health care provider. How is my port accessed? Special steps must be taken to access the port:  Before the port is accessed, a numbing cream can be placed on the skin. This helps numb the skin over the port site.  Your health care provider uses a sterile technique to access the port. ? Your health care provider must put on a mask and sterile gloves. ? The skin over your port is cleaned carefully with an antiseptic and allowed to dry. ? The port is gently pinched between sterile gloves, and a needle is inserted into the port.  Only "non-coring" port needles should be used to access the port. Once the port is accessed, a blood return should be checked. This helps ensure that the port  is in the vein and is not clogged.  If your port needs to remain accessed for a constant infusion, a clear (transparent) bandage will be placed over the needle site. The bandage and needle will need to be changed every week, or as directed by your health care provider.  Keep the bandage covering the needle clean and dry. Do not get it wet. Follow your health care provider's instructions on how to take a shower or bath while the port is accessed.  If your port does not need to stay accessed, no bandage is needed over the port.  What is flushing? Flushing helps keep the port from getting clogged. Follow your health care provider's instructions on how and when to flush the port. Ports are usually flushed with saline solution or a medicine called heparin. The need for flushing will depend on how the port is used.  If the port is used for intermittent medicines or blood draws, the port will need to be flushed: ? After medicines have been given. ? After blood has been drawn. ? As part of routine maintenance.  If a constant infusion is running, the port may not need to be flushed.  How long will my port stay implanted? The port can stay in for as long as your health care provider thinks it is needed. When it is time for the port to come out, surgery will be   done to remove it. The procedure is similar to the one performed when the port was put in. When should I seek immediate medical care? When you have an implanted port, you should seek immediate medical care if:  You notice a bad smell coming from the incision site.  You have swelling, redness, or drainage at the incision site.  You have more swelling or pain at the port site or the surrounding area.  You have a fever that is not controlled with medicine.  This information is not intended to replace advice given to you by your health care provider. Make sure you discuss any questions you have with your health care provider. Document  Released: 11/05/2005 Document Revised: 04/12/2016 Document Reviewed: 07/13/2013 Elsevier Interactive Patient Education  2017 [employer]vier Inc[employer]  

## 2017-02-07 NOTE — H&P (Signed)
History and Physical    Abigail Yoder:[id number] DOB: [birth year] DOA: 02/07/2017  I have briefly reviewed the patient's prior medical records in [place]  PCP: [name], MD  Patient coming from: home  Chief Complaint: fever and chills  HPI: Abigail Yoder is a 51 y.o. female with medical history significant of recently diagnosed breast cancer currently undergoing chemotherapy, diabetes, hypertension, obesity, is being sent for direct admission from the [facility] due to fever. Patient tells me that over the last couple days she has been feeling extreme fatigue and weak with minimal activity. She also complained of chills. Yesterday. She was evaluated in the [facility] today, she was found to be febrile with a temperature of 100.8. Blood work revealed neutropenia, and she was directed for admission for IV antibiotics. She has no chest pain, denies any cough or chest congestion. She has no abdominal pain, no nausea or vomiting. She has had a few episodes of loose stools in the last couple days. She has no sore throat, but he is been complaining of a runny nose for the past 3-4 days. She also is complaining of diffuse gum "achiness" especially with solid food and warm liquids. It appears that she has not received her Neulasta following her last chemotherapy.  Review of Systems: As per HPI otherwise 10 point review of systems negative.   Past Medical History:  Diagnosis Date  . Anemia   . Diabetes mellitus   . Family history of breast cancer   . Hypertension   . Obesity     Past Surgical History:  Procedure Laterality Date  . APPENDECTOMY    . BREAST LUMPECTOMY WITH RADIOACTIVE SEED AND SENTINEL LYMPH NODE BIOPSY Left 12/31/2016   Procedure: BREAST LUMPECTOMY WITH RADIOACTIVE SEED AND SENTINEL LYMPH NODE BIOPSY;  Surgeon: Alphonsa Overall, MD;  Location: [facility];  Service: General;  Laterality: Left;  . CERVICAL SPINE SURGERY    . CESAREAN SECTION    .  KNEE SURGERY Left   . PORTACATH PLACEMENT N/A 12/31/2016   Procedure: INSERTION PORT-A-CATH WITH [place];  Surgeon: Alphonsa Overall, MD;  Location: [facility];  Service: General;  Laterality: N/A;  . TUBAL LIGATION       reports that she quit smoking about 22 years ago. She has never used smokeless tobacco. She reports that she does not drink alcohol or use drugs.  No Known Allergies  Family History  Problem Relation Age of Onset  . Diabetes Mother   . Arthritis Mother   . Hypertension Father   . Heart disease Father   . Hyperlipidemia Father   . Breast cancer Sister 46    came back 17 years later  . Stroke Maternal Uncle   . Stroke Maternal Grandmother     Prior to Admission medications   Medication Sig Start Date End Date Taking? Authorizing Provider  amLODipine (NORVASC) 5 MG tablet Take 5 mg by mouth daily.    Historical Provider, MD  ciprofloxacin (CIPRO) 500 MG tablet Take 1 tablet (500 mg total) by mouth 2 (two) times daily. Patient not taking: Reported on 02/07/2017 01/24/17   Gardenia Phlegm, NP  dexamethasone (DECADRON) 4 MG tablet Take 2 tablets by mouth once a day on the day after chemotherapy and then take 2 tablets two times a day for 2 days. Take with food. 12/14/16   Chauncey Cruel, MD  insulin aspart (NOVOLOG PENFILL) cartridge 15 units sQ at breakfast, 10 units at  lunch, 15 units at dinner 12/16/16   Chauncey Cruel, MD  lidocaine-prilocaine (EMLA) cream Apply to affected area once 12/14/16   Chauncey Cruel, MD  LORazepam (ATIVAN) 0.5 MG tablet Take 1 tablet (0.5 mg total) by mouth every 6 (six) hours as needed (Nausea or vomiting). 12/14/16   Chauncey Cruel, MD  losartan-hydrochlorothiazide (HYZAAR) 100-25 MG tablet Take 1 tablet by mouth daily.    Historical Provider, MD  metFORMIN (GLUCOPHAGE) 1000 MG tablet Take 1,000 mg by mouth 2 (two) times daily with a meal.    Historical Provider, MD  NOVOLOG MIX 70/30 FLEXPEN (70-30) 100 UNIT/ML FlexPen  15UNITS IN AM/ 10 UNITS AT LUNCH/ 15 UNITS IN PM THREE TIMES DAILY SUBCUTANEOUS 30 DAYS 11/30/16   Historical Provider, MD  prochlorperazine (COMPAZINE) 10 MG tablet Take 1 tablet (10 mg total) by mouth every 6 (six) hours as needed (Nausea or vomiting). 12/14/16   Chauncey Cruel, MD  tamoxifen (NOLVADEX) 20 MG tablet Take 20 mg by mouth daily.    Historical Provider, MD    Physical Exam: There were no vitals filed for this visit.    Constitutional: NAD, calm, comfortable There were no vitals filed for this visit. Eyes: PERRL, lids and conjunctivae normal ENMT: Mucous membranes are moist. Posterior pharynx clear of any exudate or lesions.Normal dentition.  Neck: normal, supple, no masses, no thyromegaly Respiratory: clear to auscultation bilaterally, no wheezing, no crackles. Normal respiratory effort. No accessory muscle use.  Cardiovascular: Regular rate and rhythm, no murmurs / rubs / gallops. No extremity edema. 2+ pedal pulses. Port in place, no surrounding erythema, no tenderness Abdomen: no tenderness, no masses palpated. Bowel sounds positive.  Musculoskeletal: no clubbing / cyanosis. Normal muscle tone.  Skin: no rashes, lesions, ulcers. No induration Neurologic: CN 2-12 grossly intact. Strength 5/5 in all 4.  Psychiatric: Normal judgment and insight. Alert and oriented x 3. Normal mood.   Labs on Admission: I have personally reviewed following labs and imaging studies  CBC:  Recent Labs Lab 02/07/17 0808  WBC 0.3*  NEUTROABS 0.0*  HGB 7.8*  HCT 23.6*  MCV 87.1  PLT 818   Basic Metabolic Panel:  Recent Labs Lab 02/07/17 0808  NA 140  K 3.9  CO2 24  GLUCOSE 162*  BUN 23.0  CREATININE 1.3*  CALCIUM 8.1*   GFR: Estimated Creatinine Clearance: 57.5 mL/min (A) (by C-G formula based on SCr of 1.3 mg/dL (H)). Liver Function Tests:  Recent Labs Lab 02/07/17 0808  AST 13  ALT 14  ALKPHOS 71  BILITOT 0.49  PROT 6.6  ALBUMIN 2.8*   No results for  input(s): LIPASE, AMYLASE in the last 168 hours. No results for input(s): AMMONIA in the last 168 hours. Coagulation Profile: No results for input(s): INR, PROTIME in the last 168 hours. Cardiac Enzymes: No results for input(s): CKTOTAL, CKMB, CKMBINDEX, TROPONINI in the last 168 hours. BNP (last 3 results) No results for input(s): PROBNP in the last 8760 hours. HbA1C: No results for input(s): HGBA1C in the last 72 hours. CBG: No results for input(s): GLUCAP in the last 168 hours. Lipid Profile: No results for input(s): CHOL, HDL, LDLCALC, TRIG, CHOLHDL, LDLDIRECT in the last 72 hours. Thyroid Function Tests: No results for input(s): TSH, T4TOTAL, FREET4, T3FREE, THYROIDAB in the last 72 hours. Anemia Panel: No results for input(s): VITAMINB12, FOLATE, FERRITIN, TIBC, IRON, RETICCTPCT in the last 72 hours. Urine analysis:    Component Value Date/Time   LABSPEC 1.010 02/07/2017 1005  PHURINE 6.5 02/07/2017 1005   GLUCOSEU Negative 02/07/2017 1005   HGBUR Trace 02/07/2017 1005   BILIRUBINUR Negative 02/07/2017 1005   KETONESUR Negative 02/07/2017 1005   PROTEINUR 100 02/07/2017 1005   UROBILINOGEN 0.2 02/07/2017 1005   NITRITE Negative 02/07/2017 1005   LEUKOCYTESUR Negative 02/07/2017 1005     Radiological Exams on Admission: No results found. CXR pending  Assessment/Plan Active Problems:   Neutropenic fever (HCC)   Type 2 diabetes mellitus (HCC)   Essential hypertension   Obesity (BMI 30-39.9)   Malignant neoplasm of upper-inner quadrant of left breast in female, estrogen receptor positive ([place])   Neutropenic fever -Patient is currently receiving chemotherapy for her breast cancer, she is on cycle 2 day 8 of doxorubicin and cyclophosphamide, unfortunately did not receive Neulasta after her last treatment. -Patient is febrile in the cancer center to 100.8, however has no other signs to suggest sepsis at this time -Start empiric antibiotics with vancomycin and  cefepime for her neutropenia, blood cultures were already obtained in the cancer center prior to transfer. She was also given a dose of ceftriaxone prior to transfer and a dose of Granix -Urinalysis was unremarkable. -We'll obtain a chest x-ray for completeness  Chemotherapy-induced anemia -No bleeding reported, hemoglobin 7.8, continue to monitor and transfuse as needed to keep hemoglobin greater than 7. Platelets are normal today, will monitor and discontinue Lovenox if they become less than 100 K  Diabetes mellitus -Place on low-dose Levemir as well as sliding scale. She is on 70/30 at home, She is being followed by Dr. Chalmers Cater for her diabetes  Hypertension -Resume home medications    DVT prophylaxis: Lovenox  Code Status: Full code  Family Communication: family bedside Disposition Plan: admit to [facility] called: none      Admission status: inpatient   At the time of admission, it appears that the appropriate admission status for this patient is INPATIENT. This is judged to be reasonable and necessary in order to provide the required high service intensity to ensure the patient's safety given the presenting symptoms with neutropenic fever, physical exam findings, and initial radiographic and laboratory data in the context of their chronic comorbidities. Current circumstances are felt to place patient at high risk for further clinical deterioration threatening life, limb, or organ. Moreover, it is my clinical judgment that the patient will require inpatient hospital care spanning beyond 2 midnights from the point of admission and that early discharge would result in unnecessary risk of decompensation and readmission or threat to life, limb or bodily function.   Marzetta Board, MD [facility] Pager [phone]  If 7PM-7AM, please contact night-coverage [email or web address] Password [facility]  02/07/2017, 11:49 AM

## 2017-02-07 NOTE — Progress Notes (Signed)
Pharmacy Antibiotic Note  Abigail Yoder is a 51 y.o. female admitted on 02/07/2017 with FN.  Pharmacy has been consulted for vancomycin dosing.  Plan: Vancomycin 1500mg  IV every 24 hours.  Goal trough 15-20 mcg/mL.     Temp (24hrs), Avg:100.5 F (38.1 C), Min:100.1 F (37.8 C), Max:100.8 F (38.2 C)   Recent Labs Lab 02/07/17 0808  WBC 0.3*  CREATININE 1.3*    Estimated Creatinine Clearance: 57.5 mL/min (A) (by C-G formula based on SCr of 1.3 mg/dL (H)).    No Known Allergies    Thank you for allowing pharmacy to be a part of this patient's care.   Adrian Saran, PharmD, BCPS Pager [phone] 02/07/2017 12:09 PM

## 2017-02-07 NOTE — Progress Notes (Signed)
Pt taken via wheelchair up to room 1329. Attemp made to call report, advised by staff RN unavailable to leave number for call back. Requested RN call NP as this RN is in the infusion room and may not be available.

## 2017-02-07 NOTE — Patient Instructions (Signed)
Ceftriaxone injection What is this medicine? CEFTRIAXONE (sef try AX one) is a cephalosporin antibiotic. It is used to treat certain kinds of bacterial infections. It will not work for colds, flu, or other viral infections. This medicine may be used for other purposes; ask your health care provider or pharmacist if you have questions. COMMON BRAND NAME(S): Rocephin What should I tell my health care provider before I take this medicine? They need to know if you have any of these conditions: -any chronic illness -bowel disease, like colitis -both kidney and liver disease -high bilirubin level in newborn patients -an unusual or allergic reaction to ceftriaxone, other cephalosporin or penicillin antibiotics, foods, dyes, or preservatives -pregnant or trying to get pregnant -breast-feeding How should I use this medicine? This medicine is injected into a muscle or infused it into a vein. It is usually given in a medical office or clinic. If you are to give this medicine you will be taught how to inject it. Follow instructions carefully. Use your doses at regular intervals. Do not take your medicine more often than directed. Do not skip doses or stop your medicine early even if you feel better. Do not stop taking except on your doctor's advice. Talk to your pediatrician regarding the use of this medicine in children. Special care may be needed. Overdosage: If you think you have taken too much of this medicine contact a poison control center or emergency room at once. NOTE: This medicine is only for you. Do not share this medicine with others. What if I miss a dose? If you miss a dose, take it as soon as you can. If it is almost time for your next dose, take only that dose. Do not take double or extra doses. What may interact with this medicine? Do not take this medicine with any of the following medications: -intravenous calcium This medicine may also interact with the following medications: -birth  control pills This list may not describe all possible interactions. Give your health care provider a list of all the medicines, herbs, non-prescription drugs, or dietary supplements you use. Also tell them if you smoke, drink alcohol, or use illegal drugs. Some items may interact with your medicine. What should I watch for while using this medicine? Tell your doctor or health care professional if your symptoms do not improve or if they get worse. Do not treat diarrhea with over the counter products. Contact your doctor if you have diarrhea that lasts more than 2 days or if it is severe and watery. If you are being treated for a sexually transmitted disease, avoid sexual contact until you have finished your treatment. Having sex can infect your sexual partner. Calcium may bind to this medicine and cause lung or kidney problems. Avoid calcium products while taking this medicine and for 48 hours after taking the last dose of this medicine. What side effects may I notice from receiving this medicine? Side effects that you should report to your doctor or health care professional as soon as possible: -allergic reactions like skin rash, itching or hives, swelling of the face, lips, or tongue -breathing problems -fever, chills -irregular heartbeat -pain when passing urine -seizures -stomach pain, cramps -unusual bleeding, bruising -unusually weak or tired Side effects that usually do not require medical attention (report to your doctor or health care professional if they continue or are bothersome): -diarrhea -dizzy, drowsy -headache -nausea, vomiting -pain, swelling, irritation where injected -stomach upset -sweating This list may not describe all possible side effects.   Call your doctor for medical advice about side effects. You may report side effects to FDA at 1-800-FDA-1088. Where should I keep my medicine? Keep out of the reach of children. Store at room temperature below 25 degrees C (77  degrees F). Protect from light. Throw away any unused vials after the expiration date. NOTE: This sheet is a summary. It may not cover all possible information. If you have questions about this medicine, talk to your doctor, pharmacist, or health care provider.  2018 Elsevier/Gold Standard (2014-05-24 09:14:54)  

## 2017-02-08 DIAGNOSIS — D509 Iron deficiency anemia, unspecified: Secondary | ICD-10-CM

## 2017-02-08 DIAGNOSIS — E119 Type 2 diabetes mellitus without complications: Secondary | ICD-10-CM

## 2017-02-08 DIAGNOSIS — A599 Trichomoniasis, unspecified: Secondary | ICD-10-CM

## 2017-02-08 LAB — CBC WITH DIFFERENTIAL/PLATELET
BASOS ABS: 0 10*3/uL (ref 0.0–0.1)
Basophils Relative: 3 %
Eosinophils Absolute: 0 10*3/uL (ref 0.0–0.7)
Eosinophils Relative: 0 %
HCT: 20.8 % — ABNORMAL LOW (ref 36.0–46.0)
Hemoglobin: 7.2 g/dL — ABNORMAL LOW (ref 12.0–15.0)
LYMPHS ABS: 0.3 10*3/uL — AB (ref 0.7–4.0)
Lymphocytes Relative: 97 %
MCH: 29.8 pg (ref 26.0–34.0)
MCHC: 34.6 g/dL (ref 30.0–36.0)
MCV: 86 fL (ref 78.0–100.0)
MONO ABS: 0 10*3/uL — AB (ref 0.1–1.0)
MONOS PCT: 0 %
NEUTROS PCT: 0 %
Neutro Abs: 0 10*3/uL — ABNORMAL LOW (ref 1.7–7.7)
PLATELETS: 154 10*3/uL (ref 150–400)
RBC: 2.42 MIL/uL — AB (ref 3.87–5.11)
RDW: 13.4 % (ref 11.5–15.5)
WBC: 0.3 10*3/uL — AB (ref 4.0–10.5)

## 2017-02-08 LAB — GLUCOSE, CAPILLARY
GLUCOSE-CAPILLARY: 165 mg/dL — AB (ref 65–99)
Glucose-Capillary: 95 mg/dL (ref 65–99)
Glucose-Capillary: 184 mg/dL — ABNORMAL HIGH (ref 65–99)
Glucose-Capillary: 195 mg/dL — ABNORMAL HIGH (ref 65–99)

## 2017-02-08 LAB — COMPREHENSIVE METABOLIC PANEL
ALT: 13 U/L — ABNORMAL LOW (ref 14–54)
AST: 14 U/L — AB (ref 15–41)
Albumin: 2.6 g/dL — ABNORMAL LOW (ref 3.5–5.0)
Alkaline Phosphatase: 51 U/L (ref 38–126)
Anion gap: 7 (ref 5–15)
BUN: 19 mg/dL (ref 6–20)
CHLORIDE: 108 mmol/L (ref 101–111)
CO2: 25 mmol/L (ref 22–32)
Calcium: 7.4 mg/dL — ABNORMAL LOW (ref 8.9–10.3)
Creatinine, Ser: 1.22 mg/dL — ABNORMAL HIGH (ref 0.44–1.00)
GFR calc Af Amer: 59 mL/min — ABNORMAL LOW (ref >60)
GFR calc non Af Amer: 51 mL/min — ABNORMAL LOW (ref >60)
GLUCOSE: 79 mg/dL (ref 65–99)
POTASSIUM: 3.5 mmol/L (ref 3.5–5.1)
Sodium: 140 mmol/L (ref 135–145)
Total Bilirubin: 0.7 mg/dL (ref 0.3–1.2)
Total Protein: 6.3 g/dL — ABNORMAL LOW (ref 6.5–8.1)

## 2017-02-08 LAB — HIV ANTIBODY (ROUTINE TESTING W REFLEX): HIV Screen 4th Generation wRfx: NONREACTIVE

## 2017-02-08 LAB — URINE CULTURE

## 2017-02-08 LAB — MAGNESIUM: Magnesium: 1 mg/dL — ABNORMAL LOW (ref 1.7–2.4)

## 2017-02-08 LAB — PHOSPHORUS: Phosphorus: 3.1 mg/dL (ref 2.5–4.6)

## 2017-02-08 MED ORDER — POTASSIUM CHLORIDE CRYS ER 20 MEQ PO TBCR
40.0000 meq | EXTENDED_RELEASE_TABLET | Freq: Once | ORAL | Status: AC
  Administered 2017-02-08: 40 meq via ORAL
  Filled 2017-02-08: qty 2

## 2017-02-08 MED ORDER — LIP MEDEX EX OINT
TOPICAL_OINTMENT | CUTANEOUS | Status: AC
  Administered 2017-02-08: 15:00:00
  Filled 2017-02-08: qty 7

## 2017-02-08 MED ORDER — METRONIDAZOLE 500 MG PO TABS
2000.0000 mg | ORAL_TABLET | Freq: Once | ORAL | Status: AC
  Administered 2017-02-08: 2000 mg via ORAL
  Filled 2017-02-08: qty 4

## 2017-02-08 MED ORDER — MAGNESIUM SULFATE 4 GM/100ML IV SOLN
4.0000 g | Freq: Once | INTRAVENOUS | Status: AC
  Administered 2017-02-08: 4 g via INTRAVENOUS
  Filled 2017-02-08: qty 100

## 2017-02-08 MED ORDER — ADULT MULTIVITAMIN W/MINERALS CH
1.0000 | ORAL_TABLET | Freq: Every day | ORAL | Status: DC
  Administered 2017-02-08 – 2017-02-14 (×7): 1 via ORAL
  Filled 2017-02-08 (×7): qty 1

## 2017-02-08 MED ORDER — BOOST / RESOURCE BREEZE PO LIQD: 1.0000 | ORAL | Status: DC

## 2017-02-08 MED ORDER — TBO-FILGRASTIM 480 MCG/0.8ML ~~LOC~~ SOSY
480.0000 ug | PREFILLED_SYRINGE | Freq: Every day | SUBCUTANEOUS | Status: DC
  Administered 2017-02-08 – 2017-02-13 (×6): 480 ug via SUBCUTANEOUS
  Filled 2017-02-08 (×9): qty 0.8

## 2017-02-08 MED ORDER — VANCOMYCIN HCL IN DEXTROSE 1-5 GM/200ML-% IV SOLN
1000.0000 mg | Freq: Two times a day (BID) | INTRAVENOUS | Status: DC
  Administered 2017-02-08 (×2): 1000 mg via INTRAVENOUS
  Filled 2017-02-08 (×2): qty 200

## 2017-02-08 NOTE — Progress Notes (Signed)
CRITICAL VALUE STICKER  CRITICAL VALUE: WBC 0.3 (same as previous day)  RECEIVER (on-site recipient of call): Lawanda RN  [place] NOTIFIED: 02/08/2017 0525  MESSENGER (representative from lab):  MD NOTIFIED: Lamar Blinks NP   TIME OF NOTIFICATION: 3744  RESPONSE: no new orders at this time

## 2017-02-08 NOTE — Progress Notes (Signed)
Pharmacy Antibiotic Note  Abigail Yoder is a 51 y.o. female admitted on 02/07/2017 with FN.  Pharmacy has been consulted for vancomycin dosing.  Plan: Due to improved SCr, will change vanc from 1500mg  q24 to 1g q12 Note cultures have not drawn despite orders  Height: 5\' 3"  (160 cm) Weight: 212 lb (96.2 kg) IBW/kg (Calculated) : 52.4  Temp (24hrs), Avg:100.3 F (37.9 C), Min:99.1 F (37.3 C), Max:102.7 F (39.3 C)   Recent Labs Lab 02/07/17 0808 02/08/17 0510  WBC 0.3* 0.3*  CREATININE 1.3* 1.22*    Estimated Creatinine Clearance: 60.9 mL/min (A) (by C-G formula based on SCr of 1.22 mg/dL (H)).    Allergies  Allergen Reactions  . Pineapple Anaphylaxis and Hives      Thank you for allowing pharmacy to be a part of this patient's care.   Adrian Saran, PharmD, BCPS Pager [phone] 02/08/2017 8:51 AM

## 2017-02-08 NOTE — Progress Notes (Signed)
RONDA RAJKUMAR   DOB:05/24/1966   PZ#:[id number]   EUM#:[id number]  Subjective: Abigail Yoder is a 51 year old woman with ER positive breast cancer, hospitalized with febrile neutropenia following her second cycle of Doxorubicin and Cyclophosphamide.  My tells me she is feeling better today, and not as weak as she did yesterday.  She is not nauseated, and her mouth is not as sore.  She is tolerating the antibiotics well.     Objective:  Vitals:   02/08/17 0456 02/08/17 1038  BP: (!) 102/57 120/69  Pulse: 91   Resp: 16   Temp: 100.1 F (37.8 C)     Body mass index is 37.55 kg/m.  Intake/Output Summary (Last 24 hours) at 02/08/17 1048 Last data filed at 02/08/17 0600  Gross per 24 hour  Intake             1130 ml  Output                0 ml  Net             1130 ml    GENERAL: Patient is a well appearing female in no acute distress, sitting up in bed eating breakfast HEENT:  Sclerae anicteric.  Oropharynx clear and moist. No ulcerations or evidence of oropharyngeal candidiasis. Neck is supple.  NODES:  No cervical, supraclavicular, or axillary lymphadenopathy palpated.  LUNGS:  Clear to auscultation bilaterally.  No wheezes or rhonchi. HEART:  Regular rate and rhythm. No murmur appreciated. ABDOMEN:  Soft, nontender.  Positive, normoactive bowel sounds. No organomegaly palpated. EXTREMITIES:  No peripheral edema.   SKIN:  Clear with no obvious rashes or skin changes. No nail dyscrasia. NEURO:  Nonfocal. Well oriented.  Appropriate affect.    CBG (last 3)   Recent Labs  02/07/17 1703 02/07/17 2003 02/08/17 0743  GLUCAP 95 133* 95     Labs:  Admission on 02/07/2017  Component Date Value Ref Range Status  . HIV Screen 4th Generation wRfx 02/07/2017 Non Reactive  Non Reactive Final   Comment: (NOTE) Performed At: [facility] [place], [place] [id number] Lindon Romp MD PJ:[id number]   . Glucose-Capillary 02/07/2017 126* 65 - 99 mg/dL Final  . WBC  02/08/2017 0.3* 4.0 - 10.5 K/uL Final   Comment: CRITICAL RESULT CALLED TO, READ BACK BY AND VERIFIED WITH: CHERRY,B. RN '@[id number]'  ON 3.23.18 BY NMCCOY   . RBC 02/08/2017 2.42* 3.87 - 5.11 MIL/uL Final  . Hemoglobin 02/08/2017 7.2* 12.0 - 15.0 g/dL Final  . HCT 02/08/2017 20.8* 36.0 - 46.0 % Final  . MCV 02/08/2017 86.0  78.0 - 100.0 fL Final  . MCH 02/08/2017 29.8  26.0 - 34.0 pg Final  . MCHC 02/08/2017 34.6  30.0 - 36.0 g/dL Final  . RDW 02/08/2017 13.4  11.5 - 15.5 % Final  . Platelets 02/08/2017 154  150 - 400 K/uL Final  . Neutrophils Relative % 02/08/2017 0  % Final  . Lymphocytes Relative 02/08/2017 97  % Final  . Monocytes Relative 02/08/2017 0  % Final  . Eosinophils Relative 02/08/2017 0  % Final  . Basophils Relative 02/08/2017 3  % Final  . Neutro Abs 02/08/2017 0.0* 1.7 - 7.7 K/uL Final  . Lymphs Abs 02/08/2017 0.3* 0.7 - 4.0 K/uL Final  . Monocytes Absolute 02/08/2017 0.0* 0.1 - 1.0 K/uL Final  . Eosinophils Absolute 02/08/2017 0.0  0.0 - 0.7 K/uL Final  . Basophils Absolute 02/08/2017 0.0  0.0 - 0.1 K/uL Final  .  WBC Morphology 02/08/2017 WHITE COUNT CONFIRMED ON SMEAR   Final  . Sodium 02/08/2017 140  135 - 145 mmol/L Final  . Potassium 02/08/2017 3.5  3.5 - 5.1 mmol/L Final  . Chloride 02/08/2017 108  101 - 111 mmol/L Final  . CO2 02/08/2017 25  22 - 32 mmol/L Final  . Glucose, Bld 02/08/2017 79  65 - 99 mg/dL Final  . BUN 02/08/2017 19  6 - 20 mg/dL Final  . Creatinine, Ser 02/08/2017 1.22* 0.44 - 1.00 mg/dL Final  . Calcium 02/08/2017 7.4* 8.9 - 10.3 mg/dL Final  . Total Protein 02/08/2017 6.3* 6.5 - 8.1 g/dL Final  . Albumin 02/08/2017 2.6* 3.5 - 5.0 g/dL Final  . AST 02/08/2017 14* 15 - 41 U/L Final  . ALT 02/08/2017 13* 14 - 54 U/L Final  . Alkaline Phosphatase 02/08/2017 51  38 - 126 U/L Final  . Total Bilirubin 02/08/2017 0.7  0.3 - 1.2 mg/dL Final  . GFR calc non Af Amer 02/08/2017 51* >60 mL/min Final  . GFR calc Af Amer 02/08/2017 59* >60 mL/min Final    Comment: (NOTE) The eGFR has been calculated using the CKD EPI equation. This calculation has not been validated in all clinical situations. eGFR's persistently <60 mL/min signify possible Chronic Kidney Disease.   . Anion gap 02/08/2017 7  5 - 15 Final  . Phosphorus 02/08/2017 3.1  2.5 - 4.6 mg/dL Final  . Magnesium 02/08/2017 1.0* 1.7 - 2.4 mg/dL Final  . Glucose-Capillary 02/07/2017 95  65 - 99 mg/dL Final  . Glucose-Capillary 02/07/2017 133* 65 - 99 mg/dL Final  . Glucose-Capillary 02/08/2017 95  65 - 99 mg/dL Final    Urine Studies No results for input(s): UHGB, CRYS in the last 72 hours.  Invalid input(s): UACOL, UAPR, USPG, UPH, UTP, UGL, UKET, UBIL, UNIT, UROB, ULEU, UEPI, UWBC, URBC, UBAC, CAST, UCOM, BILUA  Basic Metabolic Panel:  Recent Labs Lab 02/07/17 0808 02/08/17 0510  NA 140 140  K 3.9 3.5  CL  --  108  CO2 24 25  GLUCOSE 162* 79  BUN 23.0 19  CREATININE 1.3* 1.22*  CALCIUM 8.1* 7.4*  MG  --  1.0*  PHOS  --  3.1   GFR Estimated Creatinine Clearance: 60.9 mL/min (A) (by C-G formula based on SCr of 1.22 mg/dL (H)). Liver Function Tests:  Recent Labs Lab 02/07/17 0808 02/08/17 0510  AST 13 14*  ALT 14 13*  ALKPHOS 71 51  BILITOT 0.49 0.7  PROT 6.6 6.3*  ALBUMIN 2.8* 2.6*   No results for input(s): LIPASE, AMYLASE in the last 168 hours. No results for input(s): AMMONIA in the last 168 hours. Coagulation profile No results for input(s): INR, PROTIME in the last 168 hours.  CBC:  Recent Labs Lab 02/07/17 0808 02/08/17 0510  WBC 0.3* 0.3*  NEUTROABS 0.0* 0.0*  HGB 7.8* 7.2*  HCT 23.6* 20.8*  MCV 87.1 86.0  PLT 198 154   Cardiac Enzymes: No results for input(s): CKTOTAL, CKMB, CKMBINDEX, TROPONINI in the last 168 hours. BNP: Invalid input(s): POCBNP CBG:  Recent Labs Lab 02/07/17 1326 02/07/17 1703 02/07/17 2003 02/08/17 0743  GLUCAP 126* 95 133* 95    Microbiology No results found for this or any previous visit (from the  past 240 hour(s)).    Studies:  Dg Chest 2 View  Result Date: 02/07/2017 CLINICAL DATA:  Neutropenic fever EXAM: CHEST  2 VIEW COMPARISON:  12/31/2016 FINDINGS: Lungs are clear without infiltrate effusion or mass. Negative  for pneumonia. Heart size normal. Port-A-Cath tip in the lower SVC unchanged in position. IMPRESSION: No active cardiopulmonary disease. Electronically Signed   By: Franchot Gallo M.D.   On: 02/07/2017 14:16    Assessment/Plan: 51 y.o. woman with Stage IA invasive ductal carcinoma, ER/PR positive, currently undergoing adjuvant chemotherapy with dose dense Doxorubicin and Cyclophosphamide.   1. Stage IA Breast cancer: cycle 2 day 9 of treatment.  Patient has an appointment next week, but we will hold chemotherapy next week and she will receive cycle 3 of her adjuvant chemotherapy on 02/21/17.  2. Febrile Neutropenia: On broad coverage with Cefepime and Vancomycin.  Cultures remain pending.  Trichomonas noted on urine.  + Metronidazole 2gm PO x 1, recommended she tell her husband and he be treated as well.  Continue Granix daily as she did not receive Neulasta after cycle 2 of her chemotherapy.    3. Dispo: We will see patient back in clinic on 02/14/2017 after she is discharged.    We thank the hospitalists for their excellent care of this patient!    Scot Dock, NP 02/08/2017  10:48 AM Medical Oncology and Hematology [facility] [address] [place], [place] [address] Tel. [phone]    Fax. [phone]

## 2017-02-08 NOTE — Progress Notes (Signed)
PROGRESS NOTE    BOOTS MCGLOWN  VOH:[id number] DOB: 02/02/1966 DOA: 02/07/2017 PCP: [name], MD   Brief Narrative:  Abigail Yoder is a 51 y.o. female with medical history significant of recently diagnosed breast cancer currently undergoing chemotherapy, diabetes, hypertension, obesity, is being sent for direct admission from the [place] due to fever. Patient tells me that over the last couple days she has been feeling extreme fatigue and weak with minimal activity. She also complained of chills. Yesterday. She was evaluated in the [place] today, she was found to be febrile with a temperature of 100.8. Blood work revealed neutropenia, and she was directed for admission for IV antibiotics. She has no chest pain, denies any cough or chest congestion. She has no abdominal pain, no nausea or vomiting. She has had a few episodes of loose stools in the last couple days. She has no sore throat, but he is been complaining of a runny nose for the past 3-4 days. She also is complaining of diffuse gum "achiness" especially with solid food and warm liquids. It appears that she has not received her Neulasta following her last chemotherapy.   Assessment & Plan:   Principal Problem:   Neutropenic fever ([place]) Active Problems:   Type 2 diabetes mellitus (HCC)   Iron deficiency anemia   Essential hypertension   Obesity (BMI 30-39.9)   Malignant neoplasm of upper-inner quadrant of left breast in female, estrogen receptor positive ([place])   Hypomagnesemia  #1 neutropenic fever Questionable etiology. Patient noted to have spiked a fever with a temp of 102.7 overnight. Patient states some improvement with weakness than on admission and feels a little bit better than on admission however not at baseline. Patient has been pancultured results pending. Chest x-ray negative for any acute abnormalities. Continue empiric IV vancomycin and IV cefepime. IV fluids. Supportive care.  #2 Iron deficiency  anemia Follow H&H. Transfusion threshold hemoglobin less than 7.  #3 chemotherapy-induced anemia and neutropenia Patient denies any overt bleeding. Hemoglobin currently at 7.2 from 9.2 on 01/17/2017. Transfusion threshold hemoglobin less than 7. Patient did not receive Neulasta following her last chemotherapy. Place on granix until [facility] is greater than 1. Continue empiric IV antibiotics. Hematology/oncology following.  #4 diabetes mellitus type 2 Check a hemoglobin A1c. CBGs have ranged from 95-165. Continue current dose of Levemir. Continue sliding scale insulin.  #5 hypomagnesemia Replete.  #6 hypertension Blood pressure stable. Continue current regimen of Norvasc, Cozaar, HCTZ. Follow.  #7 stage IA breast cancer Status post cycle 2 day 9 of treatment. Per hematology/oncology.  #8 Trichomonas in the urine Patient given a dose of Flagyl 2 g by mouth 1.   DVT prophylaxis: Lovenox Code Status: Full Family Communication: Updated patient and mother at bedside. Disposition Plan: Home when afebrile for 24-48 hours, resolution of neutropenia and neutropenic fevers, clinical improvement.   Consultants:   Hematology/oncology  Procedures:   Chest x-ray 02/07/2017    Antimicrobials:   IV vancomycin 02/07/2017  IV cefepime 02/07/2017   Subjective: Patient states she feels better than on admission however not at baseline. No chest pain. No shortness of breath. Patient noted to have a temperature 102.7 overnight.  Objective: Vitals:   02/07/17 2029 02/07/17 2230 02/08/17 0456 02/08/17 1038  BP: 131/72  (!) 102/57 120/69  Pulse: 96  91   Resp: 18  16   Temp: (!) 102.7 F (39.3 C) 100.3 F (37.9 C) 100.1 F (37.8 C)   TempSrc: Oral Oral Oral  SpO2: 98%  99%   Weight:      Height:        Intake/Output Summary (Last 24 hours) at 02/08/17 1225 Last data filed at 02/08/17 1000  Gross per 24 hour  Intake             1370 ml  Output                0 ml  Net              1370 ml   Filed Weights   02/07/17 1955  Weight: 96.2 kg (212 lb)    Examination:  General exam: Appears calm and comfortable  Respiratory system: Clear to auscultation. Respiratory effort normal. Cardiovascular system: S1 & S2 heard, RRR. No JVD, murmurs, rubs, gallops or clicks. No pedal edema. Gastrointestinal system: Abdomen is nondistended, soft and nontender. No organomegaly or masses felt. Normal bowel sounds heard. Central nervous system: Alert and oriented. No focal neurological deficits. Extremities: Symmetric 5 x 5 power. Skin: No rashes, lesions or ulcers Psychiatry: Judgement and insight appear normal. Mood & affect appropriate.     Data Reviewed: I have personally reviewed following labs and imaging studies  CBC:  Recent Labs Lab 02/07/17 0808 02/08/17 0510  WBC 0.3* 0.3*  NEUTROABS 0.0* 0.0*  HGB 7.8* 7.2*  HCT 23.6* 20.8*  MCV 87.1 86.0  PLT 198 595   Basic Metabolic Panel:  Recent Labs Lab 02/07/17 0808 02/08/17 0510  NA 140 140  K 3.9 3.5  CL  --  108  CO2 24 25  GLUCOSE 162* 79  BUN 23.0 19  CREATININE 1.3* 1.22*  CALCIUM 8.1* 7.4*  MG  --  1.0*  PHOS  --  3.1   GFR: Estimated Creatinine Clearance: 60.9 mL/min (A) (by C-G formula based on SCr of 1.22 mg/dL (H)). Liver Function Tests:  Recent Labs Lab 02/07/17 0808 02/08/17 0510  AST 13 14*  ALT 14 13*  ALKPHOS 71 51  BILITOT 0.49 0.7  PROT 6.6 6.3*  ALBUMIN 2.8* 2.6*   No results for input(s): LIPASE, AMYLASE in the last 168 hours. No results for input(s): AMMONIA in the last 168 hours. Coagulation Profile: No results for input(s): INR, PROTIME in the last 168 hours. Cardiac Enzymes: No results for input(s): CKTOTAL, CKMB, CKMBINDEX, TROPONINI in the last 168 hours. BNP (last 3 results) No results for input(s): PROBNP in the last 8760 hours. HbA1C: No results for input(s): HGBA1C in the last 72 hours. CBG:  Recent Labs Lab 02/07/17 1326 02/07/17 1703 02/07/17 2003  02/08/17 0743 02/08/17 1207  GLUCAP 126* 95 133* 95 165*   Lipid Profile: No results for input(s): CHOL, HDL, LDLCALC, TRIG, CHOLHDL, LDLDIRECT in the last 72 hours. Thyroid Function Tests: No results for input(s): TSH, T4TOTAL, FREET4, T3FREE, THYROIDAB in the last 72 hours. Anemia Panel: No results for input(s): VITAMINB12, FOLATE, FERRITIN, TIBC, IRON, RETICCTPCT in the last 72 hours. Sepsis Labs: No results for input(s): PROCALCITON, LATICACIDVEN in the last 168 hours.  No results found for this or any previous visit (from the past 240 hour(s)).       Radiology Studies: Dg Chest 2 View  Result Date: 02/07/2017 CLINICAL DATA:  Neutropenic fever EXAM: CHEST  2 VIEW COMPARISON:  12/31/2016 FINDINGS: Lungs are clear without infiltrate effusion or mass. Negative for pneumonia. Heart size normal. Port-A-Cath tip in the lower SVC unchanged in position. IMPRESSION: No active cardiopulmonary disease. Electronically Signed   By: Franchot Gallo M.D.  On: 02/07/2017 14:16        Scheduled Meds: . amLODipine  5 mg Oral Daily  . ceFEPime (MAXIPIME) IV  2 g Intravenous Q8H  . enoxaparin (LOVENOX) injection  40 mg Subcutaneous Q24H  . feeding supplement (ENSURE ENLIVE)  237 mL Oral BID BM  . losartan  100 mg Oral Daily   And  . hydrochlorothiazide  25 mg Oral Daily  . insulin aspart  0-15 Units Subcutaneous TID WC  . insulin aspart  0-5 Units Subcutaneous QHS  . insulin detemir  12 Units Subcutaneous QHS  . magnesium sulfate 1 - 4 g bolus IVPB  4 g Intravenous Once  . tamoxifen  20 mg Oral Daily  . Tbo-filgastrim (GRANIX) SQ  480 mcg Subcutaneous q1800  . vancomycin  1,000 mg Intravenous Q12H   Continuous Infusions:   LOS: 1 day    Time spent: 68 minutes    THOMPSON,DANIEL, MD [facility] Pager [phone]  If 7PM-7AM, please contact night-coverage [email or web address] Password [facility] 02/08/2017, 12:25 PM

## 2017-02-08 NOTE — Progress Notes (Signed)
Initial Nutrition Assessment  DOCUMENTATION CODES:   Obesity unspecified  INTERVENTION:  - Continue Ensure Enlive BID, each supplement provides 350 kcal and 20 grams of protein - Will trial Boost Breeze once/day, this supplement provides 250 kcal and 9 grams of protein - Will order multivitamin with minerals. - RD will continue to monitor for needs.  NUTRITION DIAGNOSIS:   Inadequate oral intake related to cancer and cancer related treatments as evidenced by per patient/family report.  GOAL:   Patient will meet greater than or equal to 90% of their needs  MONITOR:   PO intake, Supplement acceptance, Weight trends, Labs  REASON FOR ASSESSMENT:   Malnutrition Screening Tool  ASSESSMENT:   51 y.o. female with medical history significant of recently diagnosed breast cancer currently undergoing chemotherapy, diabetes, hypertension, obesity, is being sent for direct admission from the [place] due to fever. Patient tells me that over the last couple days she has been feeling extreme fatigue and weak with minimal activity. She also complained of chills. Yesterday. She was evaluated in the [place] today, she was found to be febrile with a temperature of 100.8. Blood work revealed neutropenia, and she was directed for admission for IV antibiotics.   Pt seen for MST. BMI indicates obesity. Pt reports that for lunch and dinner last night and breakfast this AM she took 1-2 bites of items on the tray. She has been trying a variety of meats, vegetables, fruits, beverages, and cereal for breakfast. So far she has done best with water, apple juice, and graham crackers. Pt reports that since beginning chemo (she is s/p 2/9 planned chemo treatments) she has been experiencing metallic taste with all PO intakes. Lunch tray came during RD visit so discussion was brief so that pt could attempt to eat while food was still warm. She states that she has been trying her best to eat something at all  meals and to drink water to stay hydrated. Talked with her about use of plastic rather than metal utensils and pour liquids that are in a can into a different cup; pt had not tried this PTA but is interested in trying this following d/c. She has not yet received Ensure but states RN told her she would bring it soon. Pt states that all beverages need to be very, very cold in order for her to be able to drink them.   Physical assessment shows no muscle or fat wasting at this time. Per chart review, pt has lost 11 lbs (5% body weight) in the past 1 month which is significant for time frame. Unable to quantify PO intakes PTA. Pt likely does meet criteria for malnutrition but unable to confirm without additional information at this time.  Medications reviewed; sliding scale Novolog, 12 units Lantus/day, 100 mg Cozaar with 25 mg Hydrodiuril/day, 4 g IV Mg sulfate x1 dose today, 40 mEq oral KCl x1 dose/day.  Labs reviewed; CBG: 95 mg/dL today, creatinine: 1.22 mg/dL, Ca: 7.4 mg/dL, Mg: 1 mg/dL, GFR: 59 mL/min.    Diet Order:  Diet Carb Modified Fluid consistency: Thin; Room service appropriate? Yes  Skin:  Reviewed, no issues  Last BM:  3/22  Height:   Ht Readings from Last 1 Encounters:  02/07/17 5\' 3"  (1.6 m)    Weight:   Wt Readings from Last 1 Encounters:  02/07/17 212 lb (96.2 kg)    Ideal Body Weight:  56.82 kg  BMI:  Body mass index is 37.55 kg/m.  Estimated Nutritional Needs:  Kcal:  1925-2115 (20-22 kcal/kg)  Protein:  115-125 grams (1.2-1.3 grams/kg)  Fluid:  1.9-2.1 L/day  EDUCATION NEEDS:   No education needs identified at this time    Jarome Matin, MS, RD, LDN, CNSC Inpatient Clinical Dietitian Pager # [phone] After hours/weekend pager # [phone]

## 2017-02-09 ENCOUNTER — Inpatient Hospital Stay (HOSPITAL_COMMUNITY): Payer: BLUE CROSS/BLUE SHIELD

## 2017-02-09 DIAGNOSIS — Z5181 Encounter for therapeutic drug level monitoring: Secondary | ICD-10-CM

## 2017-02-09 DIAGNOSIS — Z833 Family history of diabetes mellitus: Secondary | ICD-10-CM

## 2017-02-09 DIAGNOSIS — D701 Agranulocytosis secondary to cancer chemotherapy: Principal | ICD-10-CM

## 2017-02-09 DIAGNOSIS — Z91018 Allergy to other foods: Secondary | ICD-10-CM

## 2017-02-09 DIAGNOSIS — Z87891 Personal history of nicotine dependence: Secondary | ICD-10-CM

## 2017-02-09 DIAGNOSIS — K0889 Other specified disorders of teeth and supporting structures: Secondary | ICD-10-CM

## 2017-02-09 DIAGNOSIS — Z823 Family history of stroke: Secondary | ICD-10-CM

## 2017-02-09 DIAGNOSIS — Z803 Family history of malignant neoplasm of breast: Secondary | ICD-10-CM

## 2017-02-09 DIAGNOSIS — Z8261 Family history of arthritis: Secondary | ICD-10-CM

## 2017-02-09 DIAGNOSIS — Z8249 Family history of ischemic heart disease and other diseases of the circulatory system: Secondary | ICD-10-CM

## 2017-02-09 DIAGNOSIS — Z8349 Family history of other endocrine, nutritional and metabolic diseases: Secondary | ICD-10-CM

## 2017-02-09 LAB — CBC WITH DIFFERENTIAL/PLATELET
BASOS ABS: 0 10*3/uL (ref 0.0–0.1)
Basophils Relative: 0 %
EOS PCT: 0 %
Eosinophils Absolute: 0 10*3/uL (ref 0.0–0.7)
HEMATOCRIT: 20.4 % — AB (ref 36.0–46.0)
Hemoglobin: 7 g/dL — ABNORMAL LOW (ref 12.0–15.0)
LYMPHS ABS: 0.2 10*3/uL — AB (ref 0.7–4.0)
Lymphocytes Relative: 90 %
MCH: 28.9 pg (ref 26.0–34.0)
MCHC: 33.8 g/dL (ref 30.0–36.0)
MCV: 85.4 fL (ref 78.0–100.0)
MONO ABS: 0 10*3/uL — AB (ref 0.1–1.0)
Monocytes Relative: 0 %
NEUTROS PCT: 10 %
Neutro Abs: 0 10*3/uL — ABNORMAL LOW (ref 1.7–7.7)
PLATELETS: 119 10*3/uL — AB (ref 150–400)
RBC: 2.39 MIL/uL — AB (ref 3.87–5.11)
RDW: 13.4 % (ref 11.5–15.5)
WBC: 0.2 10*3/uL — AB (ref 4.0–10.5)

## 2017-02-09 LAB — URINALYSIS, ROUTINE W REFLEX MICROSCOPIC
BACTERIA UA: NONE SEEN
BILIRUBIN URINE: NEGATIVE
Glucose, UA: NEGATIVE mg/dL
HGB URINE DIPSTICK: NEGATIVE
Ketones, ur: NEGATIVE mg/dL
LEUKOCYTES UA: NEGATIVE
NITRITE: NEGATIVE
PH: 5 (ref 5.0–8.0)
Protein, ur: 100 mg/dL — AB
Specific Gravity, Urine: 1.013 (ref 1.005–1.030)

## 2017-02-09 LAB — GLUCOSE, CAPILLARY
GLUCOSE-CAPILLARY: 130 mg/dL — AB (ref 65–99)
GLUCOSE-CAPILLARY: 184 mg/dL — AB (ref 65–99)
Glucose-Capillary: 124 mg/dL — ABNORMAL HIGH (ref 65–99)
Glucose-Capillary: 151 mg/dL — ABNORMAL HIGH (ref 65–99)

## 2017-02-09 LAB — BASIC METABOLIC PANEL
ANION GAP: 4 — AB (ref 5–15)
BUN: 15 mg/dL (ref 6–20)
CO2: 25 mmol/L (ref 22–32)
CREATININE: 1.2 mg/dL — AB (ref 0.44–1.00)
Calcium: 7.7 mg/dL — ABNORMAL LOW (ref 8.9–10.3)
Chloride: 107 mmol/L (ref 101–111)
GFR, EST NON AFRICAN AMERICAN: 52 mL/min — AB (ref >60)
Glucose, Bld: 162 mg/dL — ABNORMAL HIGH (ref 65–99)
Potassium: 3.5 mmol/L (ref 3.5–5.1)
SODIUM: 136 mmol/L (ref 135–145)

## 2017-02-09 LAB — MAGNESIUM: MAGNESIUM: 2 mg/dL (ref 1.7–2.4)

## 2017-02-09 MED ORDER — VANCOMYCIN HCL IN DEXTROSE 1-5 GM/200ML-% IV SOLN
1000.0000 mg | Freq: Two times a day (BID) | INTRAVENOUS | Status: DC
  Administered 2017-02-09 – 2017-02-10 (×2): 1000 mg via INTRAVENOUS
  Filled 2017-02-09 (×2): qty 200

## 2017-02-09 MED ORDER — POTASSIUM CHLORIDE CRYS ER 20 MEQ PO TBCR
40.0000 meq | EXTENDED_RELEASE_TABLET | Freq: Once | ORAL | Status: AC
  Administered 2017-02-09: 40 meq via ORAL
  Filled 2017-02-09: qty 2

## 2017-02-09 NOTE — Progress Notes (Addendum)
PROGRESS NOTE    Abigail Yoder  OZH:[id number] DOB: [birth year] DOA: 02/07/2017 PCP: [name], MD   Brief Narrative:  Abigail Yoder is a 51 y.o. female with medical history significant of recently diagnosed breast cancer currently undergoing chemotherapy, diabetes, hypertension, obesity, is being sent for direct admission from the [place] due to fever. Patient tells me that over the last couple days she has been feeling extreme fatigue and weak with minimal activity. She also complained of chills. Yesterday. She was evaluated in the [place] today, she was found to be febrile with a temperature of 100.8. Blood work revealed neutropenia, and she was directed for admission for IV antibiotics. She has no chest pain, denies any cough or chest congestion. She has no abdominal pain, no nausea or vomiting. She has had a few episodes of loose stools in the last couple days. She has no sore throat, but he is been complaining of a runny nose for the past 3-4 days. She also is complaining of diffuse gum "achiness" especially with solid food and warm liquids. It appears that she has not received her Neulasta following her last chemotherapy.   Assessment & Plan:   Principal Problem:   Neutropenic fever ([place]) Active Problems:   Type 2 diabetes mellitus (HCC)   Iron deficiency anemia   Essential hypertension   Obesity (BMI 30-39.9)   Malignant neoplasm of upper-inner quadrant of left breast in female, estrogen receptor positive ([place])   Hypomagnesemia   Trichomonas infection  #1 neutropenic fever Questionable etiology. Patient noted to have spiked a fever with a temp of 102.3 overnight and 102.5 today. Patient states some improvement with weakness than on admission and feels better than on admission however not at baseline. Patient has been pancultured results pending. Chest x-ray negative for any acute abnormalities. Will repeat chest x-ray. Repeat UA with cultures and sensitivities. Repeat  blood cultures 2. Check lower extremity Dopplers. If patient still spiking fevers in the next 24-48 hours may need to add antifungal coverage. Discontinue IV vancomycin per ID rxcs. Continue IV cefepime. Supportive care. Consult with ID for further evaluation and recommendations.   #2 Iron deficiency anemia Follow H&H. Transfusion threshold hemoglobin less than 7.  #3 chemotherapy-induced anemia and neutropenia Patient denies any overt bleeding. Hemoglobin currently at 7.0 from 9.2 on 01/17/2017. Transfusion threshold hemoglobin less than 7. Patient did not receive Neulasta following her last chemotherapy. Continue on granix until [place] is greater than 1. Continue empiric IV antibiotics. Hematology/oncology following.  #4 diabetes mellitus type 2 Check a hemoglobin A1c. CBGs have ranged from 124-195. Continue current dose of Levemir. Continue sliding scale insulin.  #5 hypomagnesemia Repleted.  #6 hypertension Blood pressure stable. Continue current regimen of Norvasc, Cozaar, HCTZ. Follow.  #7 stage IA breast cancer Status post cycle 2 day 9 of treatment. Per hematology/oncology.  #8 Trichomonas in the urine Patient given a dose of Flagyl 2 g by mouth 1.   DVT prophylaxis: Lovenox Code Status: Full Family Communication: Updated patient and mother at bedside. Disposition Plan: Home when afebrile for 24-48 hours, resolution of neutropenia and neutropenic fevers, clinical improvement.   Consultants:   Hematology/oncology  Procedures:   Chest x-ray 02/07/2017    Antimicrobials:   IV vancomycin 02/07/2017>>>> 02/09/2017  IV cefepime 02/07/2017   Subjective: Patient states she feels better than on admission however not at baseline. No chest pain. No shortness of breath. Patient noted to have a temperature 102.3 yesterday afternoon and then 102.5 today.  Objective: Vitals:   02/08/17 1038 02/08/17 1500 02/08/17 2046 02/09/17 0425  BP: 120/69 (!) 115/50 112/72 124/69    Pulse:  95 97 96  Resp:  18 18 18   Temp:  (!) 102.3 F (39.1 C) 100.2 F (37.9 C) (!) 100.5 F (38.1 C)  TempSrc:  Oral Oral Oral  SpO2:  100% 100% 99%  Weight:      Height:        Intake/Output Summary (Last 24 hours) at 02/09/17 1021 Last data filed at 02/09/17 0547  Gross per 24 hour  Intake              790 ml  Output                0 ml  Net              790 ml   Filed Weights   02/07/17 1955  Weight: 96.2 kg (212 lb)    Examination:  General exam: Appears calm and comfortable  Respiratory system: Clear to auscultation. Respiratory effort normal. Cardiovascular system: S1 & S2 heard, RRR. No JVD, murmurs, rubs, gallops or clicks. No pedal edema. Gastrointestinal system: Abdomen is nondistended, soft and nontender. No organomegaly or masses felt. Normal bowel sounds heard. Central nervous system: Alert and oriented. No focal neurological deficits. Extremities: Symmetric 5 x 5 power. Skin: No rashes, lesions or ulcers Psychiatry: Judgement and insight appear normal. Mood & affect appropriate.     Data Reviewed: I have personally reviewed following labs and imaging studies  CBC:  Recent Labs Lab 02/07/17 0808 02/08/17 0510 02/09/17 0405  WBC 0.3* 0.3* 0.2*  NEUTROABS 0.0* 0.0* 0.0*  HGB 7.8* 7.2* 7.0*  HCT 23.6* 20.8* 20.4*  MCV 87.1 86.0 85.4  PLT 198 154 245*   Basic Metabolic Panel:  Recent Labs Lab 02/07/17 0808 02/08/17 0510 02/09/17 0405  NA 140 140 136  K 3.9 3.5 3.5  CL  --  108 107  CO2 24 25 25   GLUCOSE 162* 79 162*  BUN 23.0 19 15  CREATININE 1.3* 1.22* 1.20*  CALCIUM 8.1* 7.4* 7.7*  MG  --  1.0* 2.0  PHOS  --  3.1  --    GFR: Estimated Creatinine Clearance: 61.9 mL/min (A) (by C-G formula based on SCr of 1.2 mg/dL (H)). Liver Function Tests:  Recent Labs Lab 02/07/17 0808 02/08/17 0510  AST 13 14*  ALT 14 13*  ALKPHOS 71 51  BILITOT 0.49 0.7  PROT 6.6 6.3*  ALBUMIN 2.8* 2.6*   No results for input(s): LIPASE,  AMYLASE in the last 168 hours. No results for input(s): AMMONIA in the last 168 hours. Coagulation Profile: No results for input(s): INR, PROTIME in the last 168 hours. Cardiac Enzymes: No results for input(s): CKTOTAL, CKMB, CKMBINDEX, TROPONINI in the last 168 hours. BNP (last 3 results) No results for input(s): PROBNP in the last 8760 hours. HbA1C: No results for input(s): HGBA1C in the last 72 hours. CBG:  Recent Labs Lab 02/08/17 0743 02/08/17 1207 02/08/17 1716 02/08/17 1953 02/09/17 0807  GLUCAP 95 165* 184* 195* 124*   Lipid Profile: No results for input(s): CHOL, HDL, LDLCALC, TRIG, CHOLHDL, LDLDIRECT in the last 72 hours. Thyroid Function Tests: No results for input(s): TSH, T4TOTAL, FREET4, T3FREE, THYROIDAB in the last 72 hours. Anemia Panel: No results for input(s): VITAMINB12, FOLATE, FERRITIN, TIBC, IRON, RETICCTPCT in the last 72 hours. Sepsis Labs: No results for input(s): PROCALCITON, LATICACIDVEN in the last 168 hours.  Recent  Results (from the past 240 hour(s))  Culture, Blood     Status: None (Preliminary result)   Collection Time: 02/07/17  9:28 AM  Result Value Ref Range Status   BLOOD CULTURE, ROUTINE Preliminary report  Preliminary   RESULT 1 Comment  Preliminary    Comment: No growth detected at this time.  Culture, Blood     Status: None (Preliminary result)   Collection Time: 02/07/17 10:05 AM  Result Value Ref Range Status   BLOOD CULTURE, ROUTINE Preliminary report  Preliminary   RESULT 1 Comment  Preliminary    Comment: No growth detected at this time.  Urine Culture     Status: None   Collection Time: 02/07/17 10:05 AM  Result Value Ref Range Status   Urine Culture, Routine Final report  Final   Urine Culture result 1 Comment  Final    Comment: Mixed urogenital flora 10,000-25,000 colony forming units per mL          Radiology Studies: Dg Chest 2 View  Result Date: 02/07/2017 CLINICAL DATA:  Neutropenic fever EXAM: CHEST  2  VIEW COMPARISON:  12/31/2016 FINDINGS: Lungs are clear without infiltrate effusion or mass. Negative for pneumonia. Heart size normal. Port-A-Cath tip in the lower SVC unchanged in position. IMPRESSION: No active cardiopulmonary disease. Electronically Signed   By: Franchot Gallo M.D.   On: 02/07/2017 14:16        Scheduled Meds: . amLODipine  5 mg Oral Daily  . ceFEPime (MAXIPIME) IV  2 g Intravenous Q8H  . enoxaparin (LOVENOX) injection  40 mg Subcutaneous Q24H  . feeding supplement  1 Container Oral Q24H  . feeding supplement (ENSURE ENLIVE)  237 mL Oral BID BM  . losartan  100 mg Oral Daily   And  . hydrochlorothiazide  25 mg Oral Daily  . insulin aspart  0-15 Units Subcutaneous TID WC  . insulin aspart  0-5 Units Subcutaneous QHS  . insulin detemir  12 Units Subcutaneous QHS  . multivitamin with minerals  1 tablet Oral Daily  . tamoxifen  20 mg Oral Daily  . Tbo-filgastrim (GRANIX) SQ  480 mcg Subcutaneous q1800  . vancomycin  1,000 mg Intravenous Q12H   Continuous Infusions:   LOS: 2 days    Time spent: 40 minutes    Demani Weyrauch, MD [facility] Pager [phone]  If 7PM-7AM, please contact night-coverage [email or web address] Password [facility] 02/09/2017, 10:21 AM

## 2017-02-09 NOTE — Consult Note (Addendum)
[facility]  Total days of antibiotics 3        Day 3 vanco        Day 3 cefepime               Reason for Consult: febrile neutropenia    Referring Physician: [place] Silos  Principal Problem:   Neutropenic fever ([place]) Active Problems:   Type 2 diabetes mellitus (HCC)   Iron deficiency anemia   Essential hypertension   Obesity (BMI 30-39.9)   Malignant neoplasm of upper-inner quadrant of left breast in female, estrogen receptor positive (HCC)   Hypomagnesemia   Trichomonas infection    HPI: Abigail Yoder is a 51 y.o. female with pmhx: of HTN, DM2, anemia and recent dx of stage 1A invasive ductal breast ca s/p lumpectomy , ER/PR+  who is receiving adjuvent chemotherpay with doxorubicin and cyclophosphamide currently on cycle 2 (of 4 cycles plus 12 cycles of abraxane),  day 10 of treatment, with her 3rd cycle to start on 4/5. She states that on her first chemo cycle she had received GCSF but on this 2nd cycle she had not received it.  She was admitted on 3/22 straight from oncology clinic when it was noted she had fever of 100.8 in setting of neutropenia. She was empirically started on vancomycin and cefepime. He work up thus far includes blood cx x 2 ngtd from 3/22 but did find to have trichomonas thus she received a 1 time dose of metronidazole. She is also receiving r-metHuG-CSF to help shorten neutropenia. Her infectious work up thus far is unrevealing. I have reviewed her CXR from today that does not show any signs of pulmonary infiltrate  She states that she felt poorly when she was admitted but now improving. Joint pains improved. Occasional gum sensitivities since starting chemo   I have briefly reviewed the patient's prior medical [facility] in [facility]. Per dr. Virgie Dad note. She did also have fever after her 1st cycle but did not tell her oncology team. When she last saw dr Jana Hakim on 3/15, it was noted she had chemo induced anemia, cr mildly  elevated . There was mention to place her on prophylactic cipro due to neutropenia  ID asked to weigh in on abtx management  Past Medical History:  Diagnosis Date  . Anemia   . Diabetes mellitus   . Family history of breast cancer   . Hypertension   . Obesity     Allergies:  Allergies  Allergen Reactions  . Pineapple Anaphylaxis and Hives    MEDICATIONS: . amLODipine  5 mg Oral Daily  . ceFEPime (MAXIPIME) IV  2 g Intravenous Q8H  . enoxaparin (LOVENOX) injection  40 mg Subcutaneous Q24H  . feeding supplement  1 Container Oral Q24H  . feeding supplement (ENSURE ENLIVE)  237 mL Oral BID BM  . losartan  100 mg Oral Daily   And  . hydrochlorothiazide  25 mg Oral Daily  . insulin aspart  0-15 Units Subcutaneous TID WC  . insulin aspart  0-5 Units Subcutaneous QHS  . insulin detemir  12 Units Subcutaneous QHS  . multivitamin with minerals  1 tablet Oral Daily  . Tbo-filgastrim (GRANIX) SQ  480 mcg Subcutaneous q1800    Social History  Substance Use Topics  . Smoking status: Former Smoker    Quit date: 11/19/1994  . Smokeless tobacco: Never Used  . 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 Her husband [name] works in a warehouse. Daughter [name] works for the [employer] in [place]. Son show more right works for the Glass blower/designer at [employer] in [place]. The patient has 2 grandchildren. She attends a local [employer]   Family History  Problem Relation Age of Onset  . Diabetes Mother   . Arthritis Mother   . Hypertension Father   . Heart disease Father   . Hyperlipidemia Father   . Breast cancer Sister 40    came back 17 years later  . Stroke Maternal Uncle   . Stroke Maternal Grandmother      Review of Systems  Constitutional: + fevers only. Negative for fever, chills, diaphoresis, activity change, appetite change, fatigue and  unexpected weight change.  HENT: Negative for congestion, sore throat, rhinorrhea, sneezing, trouble swallowing and sinus pressure.  Eyes: Negative for photophobia and visual disturbance.  Respiratory: Negative for cough, chest tightness, shortness of breath, wheezing and stridor.  Cardiovascular: Negative for chest pain, palpitations and leg swelling.  Gastrointestinal: Negative for nausea, vomiting, abdominal pain, diarrhea, constipation, blood in stool, abdominal distention and anal bleeding.  Genitourinary: Negative for dysuria, hematuria, flank pain and difficulty urinating.  Musculoskeletal: Negative for myalgias, back pain, joint swelling, arthralgias and gait problem.  Skin: Negative for color change, pallor, rash and wound.  Neurological: Negative for dizziness, tremors, weakness and light-headedness.  Hematological: Negative for adenopathy. Does not bruise/bleed easily.  Psychiatric/Behavioral: Negative for behavioral problems, confusion, sleep disturbance, dysphoric mood, decreased concentration and agitation.    OBJECTIVE: Temp:  [100.2 F (37.9 C)-102.3 F (39.1 C)] 100.5 F (38.1 C) (03/24 0425) Pulse Rate:  [95-97] 96 (03/24 0425) Resp:  [18] 18 (03/24 0425) BP: (112-124)/(50-72) 124/69 (03/24 0425) SpO2:  [99 %-100 %] 99 % (03/24 0425) Physical Exam  Constitutional:  oriented to person, place, and time. appears well-developed and well-nourished. No distress.  HENT: [place]/AT, PERRLA, no scleral icterus Mouth/Throat: Oropharynx is clear and moist. No oropharyngeal exudate. Poor dentition with some inflammation of gum line in molars Cardiovascular: Normal rate, regular rhythm and normal heart sounds. Exam reveals no gallop and no friction rub.  No murmur heard.  Pulmonary/Chest: Effort normal and breath sounds normal. No respiratory distress.  has no wheezes.  Neck = supple, no nuchal rigidity Abdominal: Soft. Bowel sounds are normal.  exhibits no distension. There is no  tenderness.  Lymphadenopathy: no cervical adenopathy. No axillary adenopathy Neurological: alert and oriented to person, place, and time.  Skin: Skin is warm and dry. No rash noted. No erythema.  Psychiatric: a normal mood and affect.  behavior is normal.  Ext: trace edema bilaterally   LABS: Results for orders placed or performed during the hospital encounter of 02/07/17 (from the past 48 hour(s))  Glucose, capillary     Status: None   Collection Time: 02/07/17  5:03 PM  Result Value Ref Range   Glucose-Capillary 95 65 - 99 mg/dL  Glucose, capillary     Status: Abnormal   Collection Time: 02/07/17  8:03 PM  Result Value Ref Range   Glucose-Capillary 133 (H) 65 - 99 mg/dL  CBC with Differential/Platelet     Status: Abnormal   Collection Time: 02/08/17  5:10 AM  Result Value Ref Range   WBC 0.3 (LL) 4.0 - 10.5 K/uL    Comment: CRITICAL RESULT CALLED TO, READ BACK BY AND VERIFIED WITH: CHERRY,B. RN _0  ON 3.23.18 BY NMCCOY  RBC 2.42 (L) 3.87 - 5.11 MIL/uL   Hemoglobin 7.2 (L) 12.0 - 15.0 g/dL   HCT 20.8 (L) 36.0 - 46.0 %   MCV 86.0 78.0 - 100.0 fL   MCH 29.8 26.0 - 34.0 pg   MCHC 34.6 30.0 - 36.0 g/dL   RDW 13.4 11.5 - 15.5 %   Platelets 154 150 - 400 K/uL   Neutrophils Relative % 0 %   Lymphocytes Relative 97 %   Monocytes Relative 0 %   Eosinophils Relative 0 %   Basophils Relative 3 %   Neutro Abs 0.0 (L) 1.7 - 7.7 K/uL   Lymphs Abs 0.3 (L) 0.7 - 4.0 K/uL   Monocytes Absolute 0.0 (L) 0.1 - 1.0 K/uL   Eosinophils Absolute 0.0 0.0 - 0.7 K/uL   Basophils Absolute 0.0 0.0 - 0.1 K/uL   WBC Morphology WHITE COUNT CONFIRMED ON SMEAR   Comprehensive metabolic panel     Status: Abnormal   Collection Time: 02/08/17  5:10 AM  Result Value Ref Range   Sodium 140 135 - 145 mmol/L   Potassium 3.5 3.5 - 5.1 mmol/L   Chloride 108 101 - 111 mmol/L   CO2 25 22 - 32 mmol/L   Glucose, Bld 79 65 - 99 mg/dL   BUN 19 6 - 20 mg/dL   Creatinine, Ser 1.22 (H) 0.44 - 1.00 mg/dL    Calcium 7.4 (L) 8.9 - 10.3 mg/dL   Total Protein 6.3 (L) 6.5 - 8.1 g/dL   Albumin 2.6 (L) 3.5 - 5.0 g/dL   AST 14 (L) 15 - 41 U/L   ALT 13 (L) 14 - 54 U/L   Alkaline Phosphatase 51 38 - 126 U/L   Total Bilirubin 0.7 0.3 - 1.2 mg/dL   GFR calc non Af Amer 51 (L) >60 mL/min   GFR calc Af Amer 59 (L) >60 mL/min    Comment: (NOTE) The eGFR has been calculated using the CKD EPI equation. This calculation has not been validated in all clinical situations. eGFR's persistently <60 mL/min signify possible Chronic Kidney Disease.    Anion gap 7 5 - 15  Phosphorus     Status: None   Collection Time: 02/08/17  5:10 AM  Result Value Ref Range   Phosphorus 3.1 2.5 - 4.6 mg/dL  Magnesium     Status: Abnormal   Collection Time: 02/08/17  5:10 AM  Result Value Ref Range   Magnesium 1.0 (L) 1.7 - 2.4 mg/dL  Glucose, capillary     Status: None   Collection Time: 02/08/17  7:43 AM  Result Value Ref Range   Glucose-Capillary 95 65 - 99 mg/dL  Glucose, capillary     Status: Abnormal   Collection Time: 02/08/17 12:07 PM  Result Value Ref Range   Glucose-Capillary 165 (H) 65 - 99 mg/dL  Glucose, capillary     Status: Abnormal   Collection Time: 02/08/17  5:16 PM  Result Value Ref Range   Glucose-Capillary 184 (H) 65 - 99 mg/dL  Glucose, capillary     Status: Abnormal   Collection Time: 02/08/17  7:53 PM  Result Value Ref Range   Glucose-Capillary 195 (H) 65 - 99 mg/dL  Magnesium     Status: None   Collection Time: 02/09/17  4:05 AM  Result Value Ref Range   Magnesium 2.0 1.7 - 2.4 mg/dL  CBC with Differential/Platelet     Status: Abnormal   Collection Time: 02/09/17  4:05 AM  Result Value Ref Range  WBC 0.2 (LL) 4.0 - 10.5 K/uL    Comment: CRITICAL VALUE NOTED.  VALUE IS CONSISTENT WITH PREVIOUSLY REPORTED AND CALLED VALUE.   RBC 2.39 (L) 3.87 - 5.11 MIL/uL   Hemoglobin 7.0 (L) 12.0 - 15.0 g/dL   HCT 20.4 (L) 36.0 - 46.0 %   MCV 85.4 78.0 - 100.0 fL   MCH 28.9 26.0 - 34.0 pg   MCHC  33.8 30.0 - 36.0 g/dL   RDW 13.4 11.5 - 15.5 %   Platelets 119 (L) 150 - 400 K/uL    Comment: SPECIMEN CHECKED FOR CLOTS PLATELET COUNT CONFIRMED BY SMEAR REPEATED TO VERIFY    Neutrophils Relative % 10 %   Lymphocytes Relative 90 %   Monocytes Relative 0 %   Eosinophils Relative 0 %   Basophils Relative 0 %   Neutro Abs 0.0 (L) 1.7 - 7.7 K/uL   Lymphs Abs 0.2 (L) 0.7 - 4.0 K/uL   Monocytes Absolute 0.0 (L) 0.1 - 1.0 K/uL   Eosinophils Absolute 0.0 0.0 - 0.7 K/uL   Basophils Absolute 0.0 0.0 - 0.1 K/uL   RBC Morphology TEARDROP CELLS    WBC Morphology WHITE COUNT CONFIRMED ON SMEAR   Basic metabolic panel     Status: Abnormal   Collection Time: 02/09/17  4:05 AM  Result Value Ref Range   Sodium 136 135 - 145 mmol/L   Potassium 3.5 3.5 - 5.1 mmol/L   Chloride 107 101 - 111 mmol/L   CO2 25 22 - 32 mmol/L   Glucose, Bld 162 (H) 65 - 99 mg/dL   BUN 15 6 - 20 mg/dL   Creatinine, Ser 1.20 (H) 0.44 - 1.00 mg/dL   Calcium 7.7 (L) 8.9 - 10.3 mg/dL   GFR calc non Af Amer 52 (L) >60 mL/min   GFR calc Af Amer >60 >60 mL/min    Comment: (NOTE) The eGFR has been calculated using the CKD EPI equation. This calculation has not been validated in all clinical situations. eGFR's persistently <60 mL/min signify possible Chronic Kidney Disease.    Anion gap 4 (L) 5 - 15  Glucose, capillary     Status: Abnormal   Collection Time: 02/09/17  8:07 AM  Result Value Ref Range   Glucose-Capillary 124 (H) 65 - 99 mg/dL  Glucose, capillary     Status: Abnormal   Collection Time: 02/09/17 12:20 PM  Result Value Ref Range   Glucose-Capillary 130 (H) 65 - 99 mg/dL  Urinalysis, Routine w reflex microscopic     Status: Abnormal   Collection Time: 02/09/17  1:00 PM  Result Value Ref Range   Color, Urine YELLOW YELLOW   APPearance HAZY (A) CLEAR   Specific Gravity, Urine 1.013 1.005 - 1.030   pH 5.0 5.0 - 8.0   Glucose, UA NEGATIVE NEGATIVE mg/dL   Hgb urine dipstick NEGATIVE NEGATIVE   Bilirubin  Urine NEGATIVE NEGATIVE   Ketones, ur NEGATIVE NEGATIVE mg/dL   Protein, ur 100 (A) NEGATIVE mg/dL   Nitrite NEGATIVE NEGATIVE   Leukocytes, UA NEGATIVE NEGATIVE   RBC / HPF 0-5 0 - 5 RBC/hpf   WBC, UA 0-5 0 - 5 WBC/hpf   Bacteria, UA NONE SEEN NONE SEEN   Squamous Epithelial / LPF 0-5 (A) NONE SEEN   Mucous PRESENT     MICRO: 3/22 blood cx ngtd 3/24 blood cx ngtd 3/24 urine cx ngtd IMAGING: Dg Chest 2 View  Result Date: 02/09/2017 CLINICAL DATA:  Patient with fever.  History of breast  cancer. EXAM: CHEST  2 VIEW COMPARISON:  Chest radiograph 02/07/2017 FINDINGS: Right anterior chest wall Port-A-Cath is present tip projecting over the superior vena cava. Anterior cervical spinal fusion hardware. Normal cardiac and mediastinal contours. No consolidative pulmonary opacities. No pleural effusion or pneumothorax. Regional skeleton is unremarkable. IMPRESSION: No active cardiopulmonary disease. Electronically Signed   By: Lovey Newcomer M.D.   On: 02/09/2017 10:19    HISTORICAL MICRO/IMAGING  Assessment/Plan:  51yo F with stage A1 breast Ca on doxorubicine and cyclophosphamide cycle 2 of 4 of her treatment regimen. She is day 10 of her current cycle, has had febrile neutropenia since 3/22, started on vancomycin plus cefepime. She was also started on recombinant GCSF to minimize duration of neutropenia, no change in [place] as of yet.   Febrile neutropenia:  - continue with cefepime for gram negative coverage - continue on vancomycin given dental condition/better gram positive coverage -if still febrile tomorrow, recommend to start anidulafungin 0 - if still febrile in 72hr, may need to get chest CT/abd/pelvis looking for invasive fungal infection, though this mostly occurs with liquid tumor  Therapeutic drug monitoring = creatinine stable at 1.2 but will need to continue to monitor esp with vancomycin renal toxicity  Chemo induced Neutropenia = - continue to monitor CBC, agree with giving  GCSF until [place] >100  Trichomonas = has been treated. Repeat urine cx is pending to see that it is cleared. May consider to have her significant other treated as well so that there is no reinfection

## 2017-02-09 NOTE — Progress Notes (Addendum)
Pharmacy Antibiotic Note  Abigail Yoder is a 51 y.o. female admitted on 02/07/2017 with FN.  Pharmacy has been consulted for vancomycin dosing.  Plan: Resume vancomycin  1000 mg IV q12h Monitor clinical course, renal function, cultures as available   Height: 5\' 3"  (160 cm) Weight: 212 lb (96.2 kg) IBW/kg (Calculated) : 52.4  Temp (24hrs), Avg:101.4 F (38.6 C), Min:100.2 F (37.9 C), Max:102.5 F (39.2 C)   Recent Labs Lab 02/07/17 0808 02/08/17 0510 02/09/17 0405  WBC 0.3* 0.3* 0.2*  CREATININE 1.3* 1.22* 1.20*    Estimated Creatinine Clearance: 61.9 mL/min (A) (by C-G formula based on SCr of 1.2 mg/dL (H)).    Allergies  Allergen Reactions  . Pineapple Anaphylaxis and Hives      Thank you for allowing pharmacy to be a part of this patient's care.   Royetta Asal, PharmD, BCPS Pager [phone] 02/09/2017 3:06 PM

## 2017-02-10 ENCOUNTER — Inpatient Hospital Stay (HOSPITAL_COMMUNITY): Payer: BLUE CROSS/BLUE SHIELD

## 2017-02-10 DIAGNOSIS — Z794 Long term (current) use of insulin: Secondary | ICD-10-CM

## 2017-02-10 DIAGNOSIS — T451X5A Adverse effect of antineoplastic and immunosuppressive drugs, initial encounter: Secondary | ICD-10-CM

## 2017-02-10 DIAGNOSIS — R509 Fever, unspecified: Secondary | ICD-10-CM

## 2017-02-10 DIAGNOSIS — D61810 Antineoplastic chemotherapy induced pancytopenia: Secondary | ICD-10-CM

## 2017-02-10 LAB — CBC WITH DIFFERENTIAL/PLATELET
BASOS ABS: 0 10*3/uL (ref 0.0–0.1)
Basophils Relative: 6 %
EOS ABS: 0 10*3/uL (ref 0.0–0.7)
Eosinophils Relative: 0 %
HCT: 19.3 % — ABNORMAL LOW (ref 36.0–46.0)
Hemoglobin: 6.7 g/dL — CL (ref 12.0–15.0)
LYMPHS ABS: 0.2 10*3/uL — AB (ref 0.7–4.0)
Lymphocytes Relative: 82 %
MCH: 29.6 pg (ref 26.0–34.0)
MCHC: 34.7 g/dL (ref 30.0–36.0)
MCV: 85.4 fL (ref 78.0–100.0)
MONO ABS: 0 10*3/uL — AB (ref 0.1–1.0)
Monocytes Relative: 6 %
NEUTROS ABS: 0 10*3/uL — AB (ref 1.7–7.7)
Neutrophils Relative %: 6 %
PLATELETS: 65 10*3/uL — AB (ref 150–400)
RBC: 2.26 MIL/uL — ABNORMAL LOW (ref 3.87–5.11)
RDW: 13.5 % (ref 11.5–15.5)
WBC: 0.2 10*3/uL — CL (ref 4.0–10.5)

## 2017-02-10 LAB — URINE CULTURE: CULTURE: NO GROWTH

## 2017-02-10 LAB — SODIUM, URINE, RANDOM: SODIUM UR: 79 mmol/L

## 2017-02-10 LAB — ABO/RH: ABO/RH(D): O POS

## 2017-02-10 LAB — BASIC METABOLIC PANEL
Anion gap: 5 (ref 5–15)
BUN: 17 mg/dL (ref 6–20)
CO2: 24 mmol/L (ref 22–32)
Calcium: 8.1 mg/dL — ABNORMAL LOW (ref 8.9–10.3)
Chloride: 107 mmol/L (ref 101–111)
Creatinine, Ser: 1.46 mg/dL — ABNORMAL HIGH (ref 0.44–1.00)
GFR calc Af Amer: 47 mL/min — ABNORMAL LOW (ref >60)
GFR calc non Af Amer: 41 mL/min — ABNORMAL LOW (ref >60)
Glucose, Bld: 156 mg/dL — ABNORMAL HIGH (ref 65–99)
Potassium: 3.6 mmol/L (ref 3.5–5.1)
SODIUM: 136 mmol/L (ref 135–145)

## 2017-02-10 LAB — CREATININE, URINE, RANDOM: Creatinine, Urine: 182.33 mg/dL

## 2017-02-10 LAB — GLUCOSE, CAPILLARY
GLUCOSE-CAPILLARY: 214 mg/dL — AB (ref 65–99)
Glucose-Capillary: 198 mg/dL — ABNORMAL HIGH (ref 65–99)
Glucose-Capillary: 293 mg/dL — ABNORMAL HIGH (ref 65–99)
Glucose-Capillary: 160 mg/dL — ABNORMAL HIGH (ref 65–99)

## 2017-02-10 LAB — PREPARE RBC (CROSSMATCH)

## 2017-02-10 MED ORDER — SODIUM CHLORIDE 0.9 % IV SOLN
Freq: Once | INTRAVENOUS | Status: AC
  Administered 2017-02-10: 10:00:00 via INTRAVENOUS

## 2017-02-10 MED ORDER — INSULIN DETEMIR 100 UNIT/ML ~~LOC~~ SOLN
16.0000 [IU] | Freq: Every day | SUBCUTANEOUS | Status: DC
  Administered 2017-02-10: 16 [IU] via SUBCUTANEOUS
  Filled 2017-02-10 (×2): qty 0.16

## 2017-02-10 MED ORDER — DIPHENHYDRAMINE HCL 25 MG PO CAPS
25.0000 mg | ORAL_CAPSULE | Freq: Once | ORAL | Status: AC
  Administered 2017-02-10: 25 mg via ORAL
  Filled 2017-02-10: qty 1

## 2017-02-10 MED ORDER — FUROSEMIDE 10 MG/ML IJ SOLN
20.0000 mg | Freq: Once | INTRAMUSCULAR | Status: AC
  Administered 2017-02-10: 20 mg via INTRAVENOUS
  Filled 2017-02-10: qty 2

## 2017-02-10 MED ORDER — SODIUM CHLORIDE 0.9 % IV SOLN: Freq: Once | INTRAVENOUS | Status: AC

## 2017-02-10 MED ORDER — AMLODIPINE BESYLATE 10 MG PO TABS
10.0000 mg | ORAL_TABLET | Freq: Every day | ORAL | Status: DC
  Administered 2017-02-10 – 2017-02-14 (×5): 10 mg via ORAL
  Filled 2017-02-10 (×5): qty 1

## 2017-02-10 MED ORDER — IBUPROFEN 200 MG PO TABS
400.0000 mg | ORAL_TABLET | Freq: Four times a day (QID) | ORAL | Status: DC | PRN
  Administered 2017-02-10 – 2017-02-11 (×4): 400 mg via ORAL
  Filled 2017-02-10 (×4): qty 2

## 2017-02-10 MED ORDER — ACETAMINOPHEN 325 MG PO TABS
650.0000 mg | ORAL_TABLET | Freq: Once | ORAL | Status: AC
  Administered 2017-02-10: 650 mg via ORAL
  Filled 2017-02-10: qty 2

## 2017-02-10 MED ORDER — VANCOMYCIN HCL IN DEXTROSE 750-5 MG/150ML-% IV SOLN
750.0000 mg | Freq: Two times a day (BID) | INTRAVENOUS | Status: DC
  Administered 2017-02-10 – 2017-02-12 (×5): 750 mg via INTRAVENOUS
  Filled 2017-02-10 (×6): qty 150

## 2017-02-10 NOTE — Progress Notes (Signed)
Pharmacy Antibiotic Note  Abigail Yoder is a 51 y.o. female with ER positive breast cancer, admitted 3/22 with febrile neutropenia following her second cycle of Doxorubicin and Cyclophosphamide (given 3/15).  Pharmacy has been consulted for vancomcyin dosing.  Day #3 antibiotics. ID consulted.  Patient remains febrile and neutropenic. - Vancomycin was discontinued yesterday morning then resumed later in day by ID for better gram positive coverage given dental condition. SCr increased today so will adjust dose. - Cefepime 2g IV q8h per MD, current dose remains appriopriate - Granix 480 mcg daily - started 3/22 (did not receive Neulasta after C2 of chemo). ANC remaining 0.  Plan: Adjust vancomycin to 750 mg IV q12h for estimated CrCl~50 ml/min.  F/u SCr in AM.  Height: 5\' 3"  (160 cm) Weight: 212 lb (96.2 kg) IBW/kg (Calculated) : 52.4  Temp (24hrs), Avg:101.1 F (38.4 C), Min:99.9 F (37.7 C), Max:102.5 F (39.2 C)   Recent Labs Lab 02/07/17 0808 02/08/17 0510 02/09/17 0405 02/10/17 0500  WBC 0.3* 0.3* 0.2* 0.2*  CREATININE 1.3* 1.22* 1.20* 1.46*    Estimated Creatinine Clearance: 50.9 mL/min (A) (by C-G formula based on SCr of 1.46 mg/dL (H)).    Allergies  Allergen Reactions  . Pineapple Anaphylaxis and Hives    Antimicrobials this admission: 3/22 vanc >>  3/22 cefepime >> 3/23 Flagyl x 1 (+trich on urine)  Dose adjustments this admission: 3/23: changed vanc from 1500mg  q24 to 1g q12 for improved SCr 3/25 changed vanc from 1g q12h to 750mg  q12h for increased SCr  Microbiology results: 3/22 BCx: ngtd 3/22 UCx: 10-25K cfu/ml mixed urogenital flora  3/24 BCx x1 from central line:  3/24 BCx x 2:  3/24 UCx:   Thank you for allowing pharmacy to be a part of this patient's care.  Hershal Coria 02/10/2017 11:44 AM

## 2017-02-10 NOTE — Progress Notes (Signed)
PROGRESS NOTE    Abigail Yoder  NUU:[id number] DOB: [birth year] DOA: 02/07/2017 PCP: [name], MD   Brief Narrative:  Abigail Yoder is a 51 y.o. female with medical history significant of recently diagnosed breast cancer currently undergoing chemotherapy, diabetes, hypertension, obesity, is being sent for direct admission from the [facility] due to fever. Patient tells me that over the last couple days she has been feeling extreme fatigue and weak with minimal activity. She also complained of chills. Yesterday. She was evaluated in the [facility] today, she was found to be febrile with a temperature of 100.8. Blood work revealed neutropenia, and she was directed for admission for IV antibiotics. She has no chest pain, denies any cough or chest congestion. She has no abdominal pain, no nausea or vomiting. She has had a few episodes of loose stools in the last couple days. She has no sore throat, but he is been complaining of a runny nose for the past 3-4 days. She also is complaining of diffuse gum "achiness" especially with solid food and warm liquids. It appears that she has not received her Neulasta following her last chemotherapy.   Assessment & Plan:   Principal Problem:   Neutropenic fever ([place]) Active Problems:   Type 2 diabetes mellitus (HCC)   Iron deficiency anemia   Essential hypertension   Obesity (BMI 30-39.9)   Malignant neoplasm of upper-inner quadrant of left breast in female, estrogen receptor positive ([place])   Hypomagnesemia   Trichomonas infection  #1 neutropenic fever Questionable etiology. Patient noted to have spiked a fever with a temp of 102.3 overnight and 101 today. Patient states some improvement with weakness than on admission and feels better than on admission however not at baseline. Patient has been pancultured results pending. Repeat Chest x-ray negative for any acute abnormalities. Repeat UA with cultures and sensitivities negative leukocytes,  negative nitrites. Repeat blood cultures 2 pending. Lower extremity Dopplers negative. If patient still spiking fevers in the next 24-48 hours may need to add antifungal coverage. Continue empiric IV vancomycin IV cefepime for now per ID recommendations. ID following and appreciate input and recommendations.    #2 Iron deficiency anemia Hemoglobin currently at 6.7 from 9.2 on 01/17/2017. Transfuse 2 units packed red blood cells. Follow H&H.   #3 chemotherapy-induced anemia and neutropenia and thrombocytopenia Patient denies any overt bleeding. Hemoglobin currently at 6.7 from 7.0 from 9.2 on 01/17/2017. Transfusion threshold hemoglobin less than 7. Patient did not receive Neulasta following her last chemotherapy. Continue on granix until [place] is greater than 1. Transfuse 2 units packed red blood cells. Continue empiric IV antibiotics. Hematology/oncology following.  #4 diabetes mellitus type 2 Check a hemoglobin A1c. CBGs have ranged from 198-293. Increase Levemir to 16 units daily. Continue sliding scale insulin.  #5 hypomagnesemia Repleted.  #6 hypertension Blood pressure stable. Discontinue Cozaar and HCTZ secondary to increasing creatinine. Change Norvasc to 10 mg daily.   #7 stage IA breast cancer Status post cycle 2 day 9 of treatment. Per hematology/oncology.  #8 Trichomonas in the urine Patient given a dose of Flagyl 2 g by mouth 1.   DVT prophylaxis: SCDs Code Status: Full Family Communication: Updated patient. No family at bedside. Disposition Plan: Home when afebrile for 24-48 hours, resolution of neutropenia and neutropenic fevers, clinical improvement.   Consultants:   Hematology/oncology  Infectious disease: Dr. Baxter Flattery 02/09/2017  Procedures:   Chest x-ray 02/07/2017  Lower extremity Dopplers 02/10/2017  Transfusion 2 units packed red blood cells 02/10/2017  Antimicrobials:   IV vancomycin 02/07/2017>>>>   IV cefepime 02/07/2017   Subjective: Patient  states she feels better than on admission. No chest pain. No shortness of breath. Patient noted to still be spiking fevers with a MAXIMUM TEMPERATURE of 102.3.  Objective: Vitals:   02/10/17 0422 02/10/17 0535 02/10/17 0930 02/10/17 0956  BP: (!) 118/54  (!) 142/71 130/63  Pulse: 95  (!) 103 (!) 104  Resp: 13  16 16   Temp: (!) 101.2 F (38.4 C) (!) 100.8 F (38.2 C) (!) 101 F (38.3 C) 99.9 F (37.7 C)  TempSrc: Oral Oral Oral Oral  SpO2: 100%  100% 100%  Weight:      Height:        Intake/Output Summary (Last 24 hours) at 02/10/17 1042 Last data filed at 02/10/17 0506  Gross per 24 hour  Intake              550 ml  Output                0 ml  Net              550 ml   Filed Weights   02/07/17 1955  Weight: 96.2 kg (212 lb)    Examination:  General exam: Appears calm and comfortable.  Respiratory system: Clear to auscultation. Respiratory effort normal. Cardiovascular system: S1 & S2 heard, RRR. No JVD, murmurs, rubs, gallops or clicks. No pedal edema. Gastrointestinal system: Abdomen is nondistended, soft and nontender. No organomegaly or masses felt. Normal bowel sounds heard. Central nervous system: Alert and oriented. No focal neurological deficits. Extremities: Symmetric 5 x 5 power. Skin: No rashes, lesions or ulcers Psychiatry: Judgement and insight appear normal. Mood & affect appropriate.     Data Reviewed: I have personally reviewed following labs and imaging studies  CBC:  Recent Labs Lab 02/07/17 0808 02/08/17 0510 02/09/17 0405 02/10/17 0500  WBC 0.3* 0.3* 0.2* 0.2*  NEUTROABS 0.0* 0.0* 0.0* 0.0*  HGB 7.8* 7.2* 7.0* 6.7*  HCT 23.6* 20.8* 20.4* 19.3*  MCV 87.1 86.0 85.4 85.4  PLT 198 154 119* 65*   Basic Metabolic Panel:  Recent Labs Lab 02/07/17 0808 02/08/17 0510 02/09/17 0405 02/10/17 0500  NA 140 140 136 136  K 3.9 3.5 3.5 3.6  CL  --  108 107 107  CO2 24 25 25 24   GLUCOSE 162* 79 162* 156*  BUN 23.0 19 15 17   CREATININE 1.3*  1.22* 1.20* 1.46*  CALCIUM 8.1* 7.4* 7.7* 8.1*  MG  --  1.0* 2.0  --   PHOS  --  3.1  --   --    GFR: Estimated Creatinine Clearance: 50.9 mL/min (A) (by C-G formula based on SCr of 1.46 mg/dL (H)). Liver Function Tests:  Recent Labs Lab 02/07/17 0808 02/08/17 0510  AST 13 14*  ALT 14 13*  ALKPHOS 71 51  BILITOT 0.49 0.7  PROT 6.6 6.3*  ALBUMIN 2.8* 2.6*   No results for input(s): LIPASE, AMYLASE in the last 168 hours. No results for input(s): AMMONIA in the last 168 hours. Coagulation Profile: No results for input(s): INR, PROTIME in the last 168 hours. Cardiac Enzymes: No results for input(s): CKTOTAL, CKMB, CKMBINDEX, TROPONINI in the last 168 hours. BNP (last 3 results) No results for input(s): PROBNP in the last 8760 hours. HbA1C: No results for input(s): HGBA1C in the last 72 hours. CBG:  Recent Labs Lab 02/09/17 0807 02/09/17 1220 02/09/17 1724 02/09/17 2059 02/10/17 0733  GLUCAP  124* 130* 151* 184* 198*   Lipid Profile: No results for input(s): CHOL, HDL, LDLCALC, TRIG, CHOLHDL, LDLDIRECT in the last 72 hours. Thyroid Function Tests: No results for input(s): TSH, T4TOTAL, FREET4, T3FREE, THYROIDAB in the last 72 hours. Anemia Panel: No results for input(s): VITAMINB12, FOLATE, FERRITIN, TIBC, IRON, RETICCTPCT in the last 72 hours. Sepsis Labs: No results for input(s): PROCALCITON, LATICACIDVEN in the last 168 hours.  Recent Results (from the past 240 hour(s))  Culture, Blood     Status: None (Preliminary result)   Collection Time: 02/07/17  9:28 AM  Result Value Ref Range Status   BLOOD CULTURE, ROUTINE Preliminary report  Preliminary   RESULT 1 Comment  Preliminary    Comment: No growth in 36 - 48 hours.  Culture, Blood     Status: None (Preliminary result)   Collection Time: 02/07/17 10:05 AM  Result Value Ref Range Status   BLOOD CULTURE, ROUTINE Preliminary report  Preliminary   RESULT 1 Comment  Preliminary    Comment: No growth in 36 - 48  hours.  Urine Culture     Status: None   Collection Time: 02/07/17 10:05 AM  Result Value Ref Range Status   Urine Culture, Routine Final report  Final   Urine Culture result 1 Comment  Final    Comment: Mixed urogenital flora 10,000-25,000 colony forming units per mL          Radiology Studies: Dg Chest 2 View  Result Date: 02/09/2017 CLINICAL DATA:  Patient with fever.  History of breast cancer. EXAM: CHEST  2 VIEW COMPARISON:  Chest radiograph 02/07/2017 FINDINGS: Right anterior chest wall Port-A-Cath is present tip projecting over the superior vena cava. Anterior cervical spinal fusion hardware. Normal cardiac and mediastinal contours. No consolidative pulmonary opacities. No pleural effusion or pneumothorax. Regional skeleton is unremarkable. IMPRESSION: No active cardiopulmonary disease. Electronically Signed   By: Lovey Newcomer M.D.   On: 02/09/2017 10:19        Scheduled Meds: . amLODipine  10 mg Oral Daily  . ceFEPime (MAXIPIME) IV  2 g Intravenous Q8H  . enoxaparin (LOVENOX) injection  40 mg Subcutaneous Q24H  . feeding supplement  1 Container Oral Q24H  . feeding supplement (ENSURE ENLIVE)  237 mL Oral BID BM  . furosemide  20 mg Intravenous Once  . insulin aspart  0-15 Units Subcutaneous TID WC  . insulin aspart  0-5 Units Subcutaneous QHS  . insulin detemir  12 Units Subcutaneous QHS  . multivitamin with minerals  1 tablet Oral Daily  . Tbo-filgastrim (GRANIX) SQ  480 mcg Subcutaneous q1800  . vancomycin  1,000 mg Intravenous Q12H   Continuous Infusions:   LOS: 3 days    Time spent: 40 minutes    Latunya Kissick, MD [facility] Pager [phone]  If 7PM-7AM, please contact night-coverage [email or web address] Password [facility] 02/10/2017, 10:42 AM

## 2017-02-10 NOTE — Plan of Care (Signed)
Problem: Nutrition: Goal: Adequate nutrition will be maintained Outcome: Progressing Patient with slightly improved po intake today.  She has tolerated 2 Ensures.

## 2017-02-10 NOTE — Progress Notes (Signed)
ID Progress Note:  - still febrile though this morning temp lower than previous 24hr - still remains neutropenic with ANC 0 -  Per discuss with dr Grandville Silos, patient feeling well, non-toxic  - recommend to continue with vancomycin and cefepime today - see how her fever curve looks tomorrow  - if still febrile, would consider empiric antifungal - agree with dr Grandville Silos to d/c nephrotoxic drugs when receiving vancomycin since her Cr mildly elevated today - continue with GCSF to help boost WBC - no new cx results to guide therapy  Dr Johnnye Sima to return tomorrow to provide further recs

## 2017-02-10 NOTE — Progress Notes (Signed)
**  Preliminary report by tech**  Bilateral lower extremity venous duplex completed. There is no evidence of deep or superficial vein thrombosis involving the right and left lower extremities. All visualized vessels appear patent and compressible. There is no evidence of Baker's cysts bilaterally.  02/10/17 9:26 AM Abigail Yoder RVT

## 2017-02-10 NOTE — Progress Notes (Signed)
Prior to starting blood transfusion, patient's temperature was 101 despite receiving Tylenol pre-medication.  Dr. Grandville Silos notified and he stated to go ahead and proceed with transfusion.  Transfusion initiated and patient also given po Ibuprofen.  Patient's temperature trended down throughout transfusion and patient tolerated both units of PRBCs with no issues.  Zandra Abts [facility]  02/10/2017 6:45 PM

## 2017-02-11 ENCOUNTER — Other Ambulatory Visit: Payer: Self-pay | Admitting: Oncology

## 2017-02-11 DIAGNOSIS — Z79810 Long term (current) use of selective estrogen receptor modulators (SERMs): Secondary | ICD-10-CM

## 2017-02-11 DIAGNOSIS — Z95828 Presence of other vascular implants and grafts: Secondary | ICD-10-CM

## 2017-02-11 DIAGNOSIS — C50412 Malignant neoplasm of upper-outer quadrant of left female breast: Secondary | ICD-10-CM

## 2017-02-11 LAB — MAGNESIUM: MAGNESIUM: 1.5 mg/dL — AB (ref 1.7–2.4)

## 2017-02-11 LAB — BPAM RBC
Blood Product Expiration Date: 201804192359
Blood Product Expiration Date: 201804192359
ISSUE DATE / TIME: 201803250930
ISSUE DATE / TIME: 201803251202
Unit Type and Rh: 5100
Unit Type and Rh: 5100

## 2017-02-11 LAB — TYPE AND SCREEN
ABO/RH(D): O POS
Antibody Screen: NEGATIVE
Unit division: 0
Unit division: 0

## 2017-02-11 LAB — GLUCOSE, CAPILLARY
GLUCOSE-CAPILLARY: 207 mg/dL — AB (ref 65–99)
Glucose-Capillary: 257 mg/dL — ABNORMAL HIGH (ref 65–99)
Glucose-Capillary: 160 mg/dL — ABNORMAL HIGH (ref 65–99)
Glucose-Capillary: 298 mg/dL — ABNORMAL HIGH (ref 65–99)

## 2017-02-11 LAB — BASIC METABOLIC PANEL
Anion gap: 6 (ref 5–15)
BUN: 22 mg/dL — AB (ref 6–20)
CHLORIDE: 106 mmol/L (ref 101–111)
CO2: 23 mmol/L (ref 22–32)
Calcium: 8.4 mg/dL — ABNORMAL LOW (ref 8.9–10.3)
Creatinine, Ser: 1.28 mg/dL — ABNORMAL HIGH (ref 0.44–1.00)
GFR calc Af Amer: 55 mL/min — ABNORMAL LOW (ref >60)
GFR calc non Af Amer: 48 mL/min — ABNORMAL LOW (ref >60)
Glucose, Bld: 253 mg/dL — ABNORMAL HIGH (ref 65–99)
POTASSIUM: 3.8 mmol/L (ref 3.5–5.1)
SODIUM: 135 mmol/L (ref 135–145)

## 2017-02-11 LAB — CBC WITH DIFFERENTIAL/PLATELET
BASOS PCT: 0 %
Basophils Absolute: 0 10*3/uL (ref 0.0–0.1)
EOS ABS: 0 10*3/uL (ref 0.0–0.7)
EOS PCT: 0 %
HEMATOCRIT: 26.1 % — AB (ref 36.0–46.0)
HEMOGLOBIN: 9 g/dL — AB (ref 12.0–15.0)
Lymphocytes Relative: 63 %
Lymphs Abs: 0.1 10*3/uL — ABNORMAL LOW (ref 0.7–4.0)
MCH: 28.5 pg (ref 26.0–34.0)
MCHC: 34.5 g/dL (ref 30.0–36.0)
MCV: 82.6 fL (ref 78.0–100.0)
Monocytes Absolute: 0 10*3/uL — ABNORMAL LOW (ref 0.1–1.0)
Monocytes Relative: 8 %
NEUTROS PCT: 29 %
Neutro Abs: 0.1 10*3/uL — ABNORMAL LOW (ref 1.7–7.7)
Platelets: 61 10*3/uL — ABNORMAL LOW (ref 150–400)
RBC: 3.16 MIL/uL — ABNORMAL LOW (ref 3.87–5.11)
RDW: 14.6 % (ref 11.5–15.5)
WBC: 0.2 10*3/uL — CL (ref 4.0–10.5)

## 2017-02-11 LAB — CBC
HEMATOCRIT: 25.9 % — AB (ref 36.0–46.0)
HEMOGLOBIN: 9 g/dL — AB (ref 12.0–15.0)
MCH: 29 pg (ref 26.0–34.0)
MCHC: 34.7 g/dL (ref 30.0–36.0)
MCV: 83.5 fL (ref 78.0–100.0)
Platelets: 58 10*3/uL — ABNORMAL LOW (ref 150–400)
RBC: 3.1 MIL/uL — ABNORMAL LOW (ref 3.87–5.11)
RDW: 14.7 % (ref 11.5–15.5)
WBC: 0.3 10*3/uL — CL (ref 4.0–10.5)

## 2017-02-11 MED ORDER — MAGNESIUM SULFATE 4 GM/100ML IV SOLN
4.0000 g | Freq: Once | INTRAVENOUS | Status: AC
  Administered 2017-02-11: 4 g via INTRAVENOUS
  Filled 2017-02-11: qty 100

## 2017-02-11 MED ORDER — GLUCERNA SHAKE PO LIQD
237.0000 mL | Freq: Three times a day (TID) | ORAL | Status: DC
  Administered 2017-02-11 – 2017-02-13 (×5): 237 mL via ORAL
  Filled 2017-02-11 (×12): qty 237

## 2017-02-11 MED ORDER — LIDOCAINE-PRILOCAINE 2.5-2.5 % EX CREA: TOPICAL_CREAM | Freq: Once | CUTANEOUS | Status: DC

## 2017-02-11 MED ORDER — SODIUM CHLORIDE 0.9 % IV SOLN
1.0000 g | Freq: Three times a day (TID) | INTRAVENOUS | Status: DC
  Administered 2017-02-11 – 2017-02-13 (×8): 1 g via INTRAVENOUS
  Filled 2017-02-11 (×9): qty 1

## 2017-02-11 MED ORDER — INSULIN DETEMIR 100 UNIT/ML ~~LOC~~ SOLN
20.0000 [IU] | Freq: Every day | SUBCUTANEOUS | Status: DC
  Administered 2017-02-11 – 2017-02-13 (×3): 20 [IU] via SUBCUTANEOUS
  Filled 2017-02-11 (×3): qty 0.2

## 2017-02-11 NOTE — Progress Notes (Signed)
INFECTIOUS DISEASE PROGRESS NOTE  ID: Abigail Yoder is a 51 y.o. female with  Principal Problem:   Neutropenic fever ([place]) Active Problems:   Type 2 diabetes mellitus (HCC)   Iron deficiency anemia   Essential hypertension   Obesity (BMI 30-39.9)   Malignant neoplasm of upper-inner quadrant of left breast in female, estrogen receptor positive ([place])   Hypomagnesemia   Trichomonas infection   Antineoplastic chemotherapy induced pancytopenia (HCC)  Subjective: Without complaints- no SOB/cough, no dysuria, normal BM. No problems with port  Abtx:  Anti-infectives    Start     Dose/Rate Route Frequency Ordered Stop   02/11/17 0800  meropenem (MERREM) 1 g in sodium chloride 0.9 % 100 mL IVPB     1 g 200 mL/hr over 30 Minutes Intravenous Every 8 hours 02/11/17 0741     02/10/17 1600  vancomycin (VANCOCIN) IVPB 750 mg/150 ml premix     750 mg 150 mL/hr over 60 Minutes Intravenous Every 12 hours 02/10/17 1152     02/09/17 1600  vancomycin (VANCOCIN) IVPB 1000 mg/200 mL premix  Status:  Discontinued     1,000 mg 200 mL/hr over 60 Minutes Intravenous Every 12 hours 02/09/17 1459 02/10/17 1152   02/08/17 1000  vancomycin (VANCOCIN) IVPB 1000 mg/200 mL premix  Status:  Discontinued     1,000 mg 200 mL/hr over 60 Minutes Intravenous Every 12 hours 02/08/17 0853 02/09/17 1033   02/08/17 1000  metroNIDAZOLE (FLAGYL) tablet 2,000 mg     2,000 mg Oral  Once 02/08/17 0921 02/08/17 1038   02/07/17 1400  ceFEPIme (MAXIPIME) 2 g in dextrose 5 % 50 mL IVPB  Status:  Discontinued     2 g 100 mL/hr over 30 Minutes Intravenous Every 8 hours 02/07/17 1146 02/11/17 0741   02/07/17 1400  vancomycin (VANCOCIN) 1,500 mg in sodium chloride 0.9 % 500 mL IVPB  Status:  Discontinued     1,500 mg 250 mL/hr over 120 Minutes Intravenous Every 24 hours 02/07/17 1210 02/08/17 0853      Medications:  Scheduled: . amLODipine  10 mg Oral Daily  . feeding supplement (GLUCERNA SHAKE)  237 mL Oral TID BM  .  insulin aspart  0-15 Units Subcutaneous TID WC  . insulin aspart  0-5 Units Subcutaneous QHS  . insulin detemir  16 Units Subcutaneous QHS  . lidocaine-prilocaine   Topical Once  . meropenem (MERREM) IV  1 g Intravenous Q8H  . multivitamin with minerals  1 tablet Oral Daily  . Tbo-filgastrim (GRANIX) SQ  480 mcg Subcutaneous q1800  . vancomycin  750 mg Intravenous Q12H    Objective: Vital signs in last 24 hours: Temp:  [99.8 F (37.7 C)-103 F (39.4 C)] 101.6 F (38.7 C) (03/26 1100) Pulse Rate:  [73-118] 118 (03/26 1259) Resp:  [13-18] 18 (03/26 1259) BP: (125-146)/(63-75) 144/68 (03/26 1259) SpO2:  [98 %-100 %] 98 % (03/26 1259) Weight:  [96.4 kg (212 lb 8.4 oz)] 96.4 kg (212 lb 8.4 oz) (03/26 0430)   General appearance: alert, cooperative and no distress Throat: lips, mucosa, and tongue normal; teeth and gums normal Neck: no adenopathy and supple, symmetrical, trachea midline Resp: clear to auscultation bilaterally Chest wall: no tenderness, R chest port- non-tender, no fluctuance.  Cardio: regular rate and rhythm GI: normal findings: bowel sounds normal and soft, non-tender  Lab Results  Recent Labs  02/10/17 0500 02/10/17 1700 02/11/17 0500  WBC 0.2* 0.3* 0.2*  HGB 6.7* 9.0* 9.0*  HCT 19.3* 25.9*  26.1*  NA 136  --  135  K 3.6  --  3.8  CL 107  --  106  CO2 24  --  23  BUN 17  --  22*  CREATININE 1.46*  --  1.28*   Liver Panel No results for input(s): PROT, ALBUMIN, AST, ALT, ALKPHOS, BILITOT, BILIDIR, IBILI in the last 72 hours. Sedimentation Rate No results for input(s): ESRSEDRATE in the last 72 hours. C-Reactive Protein No results for input(s): CRP in the last 72 hours.  Microbiology: Recent Results (from the past 240 hour(s))  Culture, Blood     Status: None (Preliminary result)   Collection Time: 02/07/17  9:28 AM  Result Value Ref Range Status   BLOOD CULTURE, ROUTINE Preliminary report  Preliminary   RESULT 1 Comment  Preliminary    Comment:  No growth in 36 - 48 hours.  Culture, Blood     Status: None (Preliminary result)   Collection Time: 02/07/17 10:05 AM  Result Value Ref Range Status   BLOOD CULTURE, ROUTINE Preliminary report  Preliminary   RESULT 1 Comment  Preliminary    Comment: No growth in 36 - 48 hours.  Urine Culture     Status: None   Collection Time: 02/07/17 10:05 AM  Result Value Ref Range Status   Urine Culture, Routine Final report  Final   Urine Culture result 1 Comment  Final    Comment: Mixed urogenital flora 10,000-25,000 colony forming units per mL   Culture, blood (single)     Status: None (Preliminary result)   Collection Time: 02/09/17  8:30 AM  Result Value Ref Range Status   Specimen Description BLOOD PORT  Final   Special Requests BOTTLES DRAWN AEROBIC AND ANAEROBIC 85ml  Final   Culture   Final    NO GROWTH 2 DAYS Performed at [facility], [place] [address], [place], [place] [address]    Report Status PENDING  Incomplete  Culture, blood (Routine X 2) w Reflex to ID Panel     Status: None (Preliminary result)   Collection Time: 02/09/17 10:02 AM  Result Value Ref Range Status   Specimen Description BLOOD LEFT ANTECUBITAL  Final   Special Requests BOTTLES DRAWN AEROBIC AND ANAEROBIC 10CC  Final   Culture   Final    NO GROWTH 2 DAYS Performed at [facility], [place] [address] [place], [place] [address]    Report Status PENDING  Incomplete  Culture, blood (Routine X 2) w Reflex to ID Panel     Status: None (Preliminary result)   Collection Time: 02/09/17 10:07 AM  Result Value Ref Range Status   Specimen Description BLOOD RIGHT ARM  Final   Special Requests BOTTLES DRAWN AEROBIC AND ANAEROBIC 5CC  Final   Culture   Final    NO GROWTH 2 DAYS Performed at [facility], [place] [address], [place], [place] [address]    Report Status PENDING  Incomplete  Culture, Urine     Status: None   Collection Time: 02/09/17  1:00 PM  Result Value Ref Range Status   Specimen  Description URINE, CLEAN CATCH  Final   Special Requests NONE  Final   Culture   Final    NO GROWTH Performed at [facility], [place] [address] [place], [place] [address]    Report Status 02/10/2017 FINAL  Final    Studies/Results: No results found.   Assessment/Plan: Neutropenic Fever ANC 100 GCSF Day 13 post  CTX (doxorubicin/cyclophosphamide)  Breast CA (1a)  Total days of antibiotics: 5 vanco/merrem  Continues to have fever however clinically she looks well and feels well.  Consider adding fluconazole if fever persists Await return of counts.  BCx unrevealing.  Will continue to follow.         Bobby Rumpf Infectious Diseases (pager) [phone] [email or web address] 02/11/2017, 3:07 PM  LOS: 4 days

## 2017-02-11 NOTE — Progress Notes (Addendum)
PROGRESS NOTE    Abigail Yoder  ONG:[id number] DOB: 06/17/1966 DOA: 02/07/2017 PCP: [name], MD   Brief Narrative:  Abigail Yoder is a 51 y.o. female with medical history significant of recently diagnosed breast cancer currently undergoing chemotherapy, diabetes, hypertension, obesity, is being sent for direct admission from the [facility] due to fever. Patient tells me that over the last couple days she has been feeling extreme fatigue and weak with minimal activity. She also complained of chills. Yesterday. She was evaluated in the [facility] today, she was found to be febrile with a temperature of 100.8. Blood work revealed neutropenia, and she was directed for admission for IV antibiotics. She has no chest pain, denies any cough or chest congestion. She has no abdominal pain, no nausea or vomiting. She has had a few episodes of loose stools in the last couple days. She has no sore throat, but he is been complaining of a runny nose for the past 3-4 days. She also is complaining of diffuse gum "achiness" especially with solid food and warm liquids. It appears that she has not received her Neulasta following her last chemotherapy.   Assessment & Plan:   Principal Problem:   Neutropenic fever ([place]) Active Problems:   Type 2 diabetes mellitus (HCC)   Iron deficiency anemia   Essential hypertension   Obesity (BMI 30-39.9)   Malignant neoplasm of upper-inner quadrant of left breast in female, estrogen receptor positive ([place])   Hypomagnesemia   Trichomonas infection   Antineoplastic chemotherapy induced pancytopenia ([place])  #1 neutropenic fever Questionable etiology. Patient noted to have spiked a fever with a temp of 102.5 overnight and 103 today. Patient states some improvement with weakness than on admission and feels better than on admission however not at baseline. Patient has been pancultured results pending. Repeat Chest x-ray negative for any acute abnormalities. Repeat UA  with cultures and sensitivities negative leukocytes, negative nitrites. Repeat blood cultures 2 pending. Lower extremity Dopplers negative. IV cefepime has been discontinued and patient started on IV Merrem to broaden coverage for anaerobic 6 per her hematologist/oncologist. Hematology oncology seems to feel that fungal infections unlikely given brief period of neutropenia. Continue IV vancomycin. If patient still spiking fevers in the next 24-48 hours may need to add antifungal coverage. ID following and appreciate input and recommendations.    #2 Iron deficiency anemia Hemoglobin currently at 9.0 from 6.7 from 9.2 on 01/17/2017. s/p  2 units packed red blood cells. Follow H&H.   #3 chemotherapy-induced anemia and neutropenia and thrombocytopenia Patient denies any overt bleeding. Hemoglobin currently at 9.0 from 6.7 from 7.0 from 9.2 on 01/17/2017, after transfusion of 2 units PRBC (02/10/2017).  Transfusion threshold hemoglobin less than 7. Patient did not receive Neulasta following her last chemotherapy. Continue on granix until [place] is greater than 1. Continue empiric IV antibiotics. Hematology/oncology following.  #4 diabetes mellitus type 2 Hemoglobin A1c pending. CBGs have ranged from 207-257. D/C ensure and boost and place on glucerna. Increase Levemir to 20 units daily . Continue sliding scale insulin.  #5 hypomagnesemia Repleted.  #6 hypertension Blood pressure stable. Discontinued Cozaar and HCTZ secondary to increasing creatinine. Continue Norvasc to 10 mg daily.   #7 stage IA breast cancer Status post cycle 2 day 9 of treatment. Per hematology/oncology.  #8 Trichomonas in the urine Patient given a dose of Flagyl 2 g by mouth 1.   DVT prophylaxis: SCDs Code Status: Full Family Communication: Updated patient. No family at bedside. Disposition Plan:  Home when afebrile for 24-48 hours, resolution of neutropenia and neutropenic fevers, clinical improvement.   Consultants:    Hematology/oncology: Dr Jana Hakim  Infectious disease: Dr. Baxter Flattery 02/09/2017  Procedures:   Chest x-ray 02/07/2017  Lower extremity Dopplers 02/10/2017  Transfusion 2 units packed red blood cells 02/10/2017  Antimicrobials:   IV vancomycin 02/07/2017>>>>   IV cefepime 02/07/2017>>02/11/2017  IV Merrem 02/11/2017   Subjective: Patient states she feels better than on admission. No chest pain. No shortness of breath. Patient noted to still be spiking fevers with a MAXIMUM TEMPERATURE of 103.  Objective: Vitals:   02/11/17 0430 02/11/17 0815 02/11/17 0947 02/11/17 0949  BP: 135/75   135/75  Pulse: 92     Resp: 16     Temp: 99.8 F (37.7 C) (!) 102.2 F (39 C) (!) 103 F (39.4 C)   TempSrc: Oral Oral    SpO2: 100%     Weight: 96.4 kg (212 lb 8.4 oz)     Height:        Intake/Output Summary (Last 24 hours) at 02/11/17 1011 Last data filed at 02/11/17 0537  Gross per 24 hour  Intake          1782.33 ml  Output             1050 ml  Net           732.33 ml   Filed Weights   02/07/17 1955 02/10/17 1445 02/11/17 0430  Weight: 96.2 kg (212 lb) 93.7 kg (206 lb 9.1 oz) 96.4 kg (212 lb 8.4 oz)    Examination:  General exam: Appears calm and comfortable.  Respiratory system: Clear to auscultation. Respiratory effort normal. Cardiovascular system: S1 & S2 heard, RRR. No JVD, murmurs, rubs, gallops or clicks. No pedal edema. Gastrointestinal system: Abdomen is nondistended, soft and nontender. No organomegaly or masses felt. Normal bowel sounds heard. Central nervous system: Alert and oriented. No focal neurological deficits. Extremities: Symmetric 5 x 5 power. Skin: No rashes, lesions or ulcers Psychiatry: Judgement and insight appear normal. Mood & affect appropriate.     Data Reviewed: I have personally reviewed following labs and imaging studies  CBC:  Recent Labs Lab 02/07/17 0808 02/08/17 0510 02/09/17 0405 02/10/17 0500 02/10/17 1700 02/11/17 0500   WBC 0.3* 0.3* 0.2* 0.2* 0.3* 0.2*  NEUTROABS 0.0* 0.0* 0.0* 0.0*  --  0.1*  HGB 7.8* 7.2* 7.0* 6.7* 9.0* 9.0*  HCT 23.6* 20.8* 20.4* 19.3* 25.9* 26.1*  MCV 87.1 86.0 85.4 85.4 83.5 82.6  PLT 198 154 119* 65* 58* 61*   Basic Metabolic Panel:  Recent Labs Lab 02/07/17 0808 02/08/17 0510 02/09/17 0405 02/10/17 0500 02/11/17 0500  NA 140 140 136 136 135  K 3.9 3.5 3.5 3.6 3.8  CL  --  108 107 107 106  CO2 24 25 25 24 23   GLUCOSE 162* 79 162* 156* 253*  BUN 23.0 19 15 17  22*  CREATININE 1.3* 1.22* 1.20* 1.46* 1.28*  CALCIUM 8.1* 7.4* 7.7* 8.1* 8.4*  MG  --  1.0* 2.0  --  1.5*  PHOS  --  3.1  --   --   --    GFR: Estimated Creatinine Clearance: 57.5 mL/min (A) (by C-G formula based on SCr of 1.28 mg/dL (H)). Liver Function Tests:  Recent Labs Lab 02/07/17 0808 02/08/17 0510  AST 13 14*  ALT 14 13*  ALKPHOS 71 51  BILITOT 0.49 0.7  PROT 6.6 6.3*  ALBUMIN 2.8* 2.6*   No results  for input(s): LIPASE, AMYLASE in the last 168 hours. No results for input(s): AMMONIA in the last 168 hours. Coagulation Profile: No results for input(s): INR, PROTIME in the last 168 hours. Cardiac Enzymes: No results for input(s): CKTOTAL, CKMB, CKMBINDEX, TROPONINI in the last 168 hours. BNP (last 3 results) No results for input(s): PROBNP in the last 8760 hours. HbA1C: No results for input(s): HGBA1C in the last 72 hours. CBG:  Recent Labs Lab 02/10/17 0733 02/10/17 1152 02/10/17 1508 02/10/17 2058 02/11/17 0811  GLUCAP 198* 293* 160* 214* 207*   Lipid Profile: No results for input(s): CHOL, HDL, LDLCALC, TRIG, CHOLHDL, LDLDIRECT in the last 72 hours. Thyroid Function Tests: No results for input(s): TSH, T4TOTAL, FREET4, T3FREE, THYROIDAB in the last 72 hours. Anemia Panel: No results for input(s): VITAMINB12, FOLATE, FERRITIN, TIBC, IRON, RETICCTPCT in the last 72 hours. Sepsis Labs: No results for input(s): PROCALCITON, LATICACIDVEN in the last 168 hours.  Recent Results  (from the past 240 hour(s))  Culture, Blood     Status: None (Preliminary result)   Collection Time: 02/07/17  9:28 AM  Result Value Ref Range Status   BLOOD CULTURE, ROUTINE Preliminary report  Preliminary   RESULT 1 Comment  Preliminary    Comment: No growth in 36 - 48 hours.  Culture, Blood     Status: None (Preliminary result)   Collection Time: 02/07/17 10:05 AM  Result Value Ref Range Status   BLOOD CULTURE, ROUTINE Preliminary report  Preliminary   RESULT 1 Comment  Preliminary    Comment: No growth in 36 - 48 hours.  Urine Culture     Status: None   Collection Time: 02/07/17 10:05 AM  Result Value Ref Range Status   Urine Culture, Routine Final report  Final   Urine Culture result 1 Comment  Final    Comment: Mixed urogenital flora 10,000-25,000 colony forming units per mL   Culture, blood (single)     Status: None (Preliminary result)   Collection Time: 02/09/17  8:30 AM  Result Value Ref Range Status   Specimen Description BLOOD PORT  Final   Special Requests BOTTLES DRAWN AEROBIC AND ANAEROBIC 56ml  Final   Culture   Final    NO GROWTH 1 DAY Performed at [facility], [place] [address] [place], [place] [address]    Report Status PENDING  Incomplete  Culture, blood (Routine X 2) w Reflex to ID Panel     Status: None (Preliminary result)   Collection Time: 02/09/17 10:02 AM  Result Value Ref Range Status   Specimen Description BLOOD LEFT ANTECUBITAL  Final   Special Requests BOTTLES DRAWN AEROBIC AND ANAEROBIC 10CC  Final   Culture   Final    NO GROWTH 1 DAY Performed at [facility], [place] [address], [place], [place] [address]    Report Status PENDING  Incomplete  Culture, blood (Routine X 2) w Reflex to ID Panel     Status: None (Preliminary result)   Collection Time: 02/09/17 10:07 AM  Result Value Ref Range Status   Specimen Description BLOOD RIGHT ARM  Final   Special Requests BOTTLES DRAWN AEROBIC AND ANAEROBIC 5CC  Final   Culture   Final    NO  GROWTH 1 DAY Performed at [facility], [place] [address] [place], [place] [address]    Report Status PENDING  Incomplete  Culture, Urine     Status: None   Collection Time: 02/09/17  1:00 PM  Result  Value Ref Range Status   Specimen Description URINE, CLEAN CATCH  Final   Special Requests NONE  Final   Culture   Final    NO GROWTH Performed at [facility], [address], [place], [place] [address]    Report Status 02/10/2017 FINAL  Final         Radiology Studies: No results found.      Scheduled Meds: . amLODipine  10 mg Oral Daily  . feeding supplement (GLUCERNA SHAKE)  237 mL Oral TID BM  . insulin aspart  0-15 Units Subcutaneous TID WC  . insulin aspart  0-5 Units Subcutaneous QHS  . insulin detemir  16 Units Subcutaneous QHS  . lidocaine-prilocaine   Topical Once  . magnesium sulfate 1 - 4 g bolus IVPB  4 g Intravenous Once  . meropenem (MERREM) IV  1 g Intravenous Q8H  . multivitamin with minerals  1 tablet Oral Daily  . Tbo-filgastrim (GRANIX) SQ  480 mcg Subcutaneous q1800  . vancomycin  750 mg Intravenous Q12H   Continuous Infusions:   LOS: 4 days    Time spent: 46 minutes    Rodolph Hagemann, MD [facility] Pager [phone]  If 7PM-7AM, please contact night-coverage [email or web address] Password [facility] 02/11/2017, 10:11 AM

## 2017-02-11 NOTE — Progress Notes (Signed)
Abigail Yoder   DOB:May 02, 1966   DX#:[id number]   ZJQ#:[id number]  Subjective: feels well, denies h/a, N/V, visual changes, dizzyness, stiff neck; denies rash ir itching; minimal cough ("clearing my throat"), no phlegm no SOB, no pleurisy; BMs loose, no dysuria or hematurial no port discomfort; ambulates halls BID w/o difficulty; no family in room   Objective: middle aged African American woman examined in bed Vitals:   02/11/17 0046 02/11/17 0430  BP:  135/75  Pulse:  92  Resp:  16  Temp: 99.8 F (37.7 C) 99.8 F (37.7 C)    Body mass index is 37.65 kg/m.  Intake/Output Summary (Last 24 hours) at 02/11/17 0734 Last data filed at 02/11/17 0537  Gross per 24 hour  Intake          2262.33 ml  Output             1050 ml  Net          1212.33 ml     Sclerae unicteric  Oropharynx shows no thrush or other lesions  No cervical or supraclavicular adenopathy  Lungs no rales or wheezes--auscultated anterolaterally  Heart regular rate and rhythm  Abdomen soft, +BS  Neuro nonfocal  Breast exam: deferred  Skin: port intact  CBG (last 3)   Recent Labs  02/10/17 1152 02/10/17 1508 02/10/17 2058  GLUCAP 293* 160* 214*     Labs:  Lab Results  Component Value Date   WBC 0.2 (LL) 02/11/2017   HGB 9.0 (L) 02/11/2017   HCT 26.1 (L) 02/11/2017   MCV 82.6 02/11/2017   PLT 61 (L) 02/11/2017   NEUTROABS 0.1 (L) 02/11/2017    '@LASTCHEMISTRY' @  Urine Studies No results for input(s): UHGB, CRYS in the last 72 hours.  Invalid input(s): UACOL, UAPR, USPG, UPH, UTP, UGL, UKET, UBIL, UNIT, UROB, ULEU, UEPI, UWBC, URBC, UBAC, CAST, [place], [place]  Basic Metabolic Panel:  Recent Labs Lab 02/07/17 0808  02/08/17 0510 02/09/17 0405 02/10/17 0500 02/11/17 0500  NA 140  --  140 136 136 135  K 3.9  < > 3.5 3.5 3.6 3.8  CL  --   --  108 107 107 106  CO2 24  --  '25 25 24 23  ' GLUCOSE 162*  --  79 162* 156* 253*  BUN 23.0  --  '19 15 17 ' 22*  CREATININE 1.3*  --  1.22* 1.20* 1.46* 1.28*   CALCIUM 8.1*  --  7.4* 7.7* 8.1* 8.4*  MG  --   --  1.0* 2.0  --  1.5*  PHOS  --   --  3.1  --   --   --   < > = values in this interval not displayed. GFR Estimated Creatinine Clearance: 57.5 mL/min (A) (by C-G formula based on SCr of 1.28 mg/dL (H)). Liver Function Tests:  Recent Labs Lab 02/07/17 0808 02/08/17 0510  AST 13 14*  ALT 14 13*  ALKPHOS 71 51  BILITOT 0.49 0.7  PROT 6.6 6.3*  ALBUMIN 2.8* 2.6*   No results for input(s): LIPASE, AMYLASE in the last 168 hours. No results for input(s): AMMONIA in the last 168 hours. Coagulation profile No results for input(s): INR, PROTIME in the last 168 hours.  CBC:  Recent Labs Lab 02/07/17 0808 02/08/17 0510 02/09/17 0405 02/10/17 0500 02/10/17 1700 02/11/17 0500  WBC 0.3* 0.3* 0.2* 0.2* 0.3* 0.2*  NEUTROABS 0.0* 0.0* 0.0* 0.0*  --  0.1*  HGB 7.8* 7.2* 7.0* 6.7* 9.0* 9.0*  HCT  23.6* 20.8* 20.4* 19.3* 25.9* 26.1*  MCV 87.1 86.0 85.4 85.4 83.5 82.6  PLT 198 154 119* 65* 58* 61*   Cardiac Enzymes: No results for input(s): CKTOTAL, CKMB, CKMBINDEX, TROPONINI in the last 168 hours. BNP: Invalid input(s): POCBNP CBG:  Recent Labs Lab 02/09/17 2059 02/10/17 0733 02/10/17 1152 02/10/17 1508 02/10/17 2058  GLUCAP 184* 198* 293* 160* 214*   D-Dimer No results for input(s): DDIMER in the last 72 hours. Hgb A1c No results for input(s): HGBA1C in the last 72 hours. Lipid Profile No results for input(s): CHOL, HDL, LDLCALC, TRIG, CHOLHDL, LDLDIRECT in the last 72 hours. Thyroid function studies No results for input(s): TSH, T4TOTAL, T3FREE, THYROIDAB in the last 72 hours.  Invalid input(s): FREET3 Anemia work up No results for input(s): VITAMINB12, FOLATE, FERRITIN, TIBC, IRON, RETICCTPCT in the last 72 hours. Microbiology Recent Results (from the past 240 hour(s))  Culture, Blood     Status: None (Preliminary result)   Collection Time: 02/07/17  9:28 AM  Result Value Ref Range Status   BLOOD CULTURE,  ROUTINE Preliminary report  Preliminary   RESULT 1 Comment  Preliminary    Comment: No growth in 36 - 48 hours.  Culture, Blood     Status: None (Preliminary result)   Collection Time: 02/07/17 10:05 AM  Result Value Ref Range Status   BLOOD CULTURE, ROUTINE Preliminary report  Preliminary   RESULT 1 Comment  Preliminary    Comment: No growth in 36 - 48 hours.  Urine Culture     Status: None   Collection Time: 02/07/17 10:05 AM  Result Value Ref Range Status   Urine Culture, Routine Final report  Final   Urine Culture result 1 Comment  Final    Comment: Mixed urogenital flora 10,000-25,000 colony forming units per mL   Culture, blood (single)     Status: None (Preliminary result)   Collection Time: 02/09/17  8:30 AM  Result Value Ref Range Status   Specimen Description BLOOD PORT  Final   Special Requests BOTTLES DRAWN AEROBIC AND ANAEROBIC 40m  Final   Culture   Final    NO GROWTH 1 DAY Performed at [facility] [address] [place] [address]   Report Status PENDING  Incomplete  Culture, blood (Routine X 2) w Reflex to ID Panel     Status: None (Preliminary result)   Collection Time: 02/09/17 10:02 AM  Result Value Ref Range Status   Specimen Description BLOOD LEFT ANTECUBITAL  Final   Special Requests BOTTLES DRAWN AEROBIC AND ANAEROBIC 10CC  Final   Culture   Final    NO GROWTH 1 DAY Performed at [facility] [address], [place] [address]   Report Status PENDING  Incomplete  Culture, blood (Routine X 2) w Reflex to ID Panel     Status: None (Preliminary result)   Collection Time: 02/09/17 10:07 AM  Result Value Ref Range Status   Specimen Description BLOOD RIGHT ARM  Final   Special Requests BOTTLES DRAWN AEROBIC AND ANAEROBIC 5CC  Final   Culture   Final    NO GROWTH 1 DAY Performed at [facility] [address], [place] [id number]   Report Status PENDING  Incomplete  Culture, Urine     Status: None    Collection Time: 02/09/17  1:00 PM  Result Value Ref Range Status   Specimen Description URINE, CLEAN CATCH  Final  Special Requests NONE  Final   Culture   Final    NO GROWTH Performed at [facility], [place] [address], [place], [place] [address]    Report Status 02/10/2017 FINAL  Final      Studies:  Dg Chest 2 View  Result Date: 02/09/2017 CLINICAL DATA:  Patient with fever.  History of breast cancer. EXAM: CHEST  2 VIEW COMPARISON:  Chest radiograph 02/07/2017 FINDINGS: Right anterior chest wall Port-A-Cath is present tip projecting over the superior vena cava. Anterior cervical spinal fusion hardware. Normal cardiac and mediastinal contours. No consolidative pulmonary opacities. No pleural effusion or pneumothorax. Regional skeleton is unremarkable. IMPRESSION: No active cardiopulmonary disease. Electronically Signed   By: Lovey Newcomer M.D.   On: 02/09/2017 10:19    Assessment: 52 y.o. [place] woman status post left breast upper inner quadrant biopsy 11/21/2016 for a clinical T1 cN0, stage IA invasive ductal carcinoma, grade 3, estrogen and progesterone receptor positive, HER-2 not amplified, with an MIB-1 of 90%, admitted 03/22 with febrile neutropenia day 8 cycle 2 of cyclphosphamide/ doxorubicin  (1) Oncotype DX Score of 60 predicts a risk of recurrence outside the breast of 34% if the patient's only systemic therapy is tamoxifen for 5 years. On a similar database this risk drops to about 12% if chemotherapy is also given  (2) genetics testing sent 11/28/2016; The Breast/GYN gene panel offered by [facility] includes sequencing and rearrangement analysis for the following 23 genes: ATM, BARD1, BRCA1, BRCA2, BRIP1, CDH1, CHEK2, EPCAM, FANCC, MLH1, MSH2, MSH6, MUTYH, NBN, NF1, PALB2, PMS2, POLD1, PTEN, RAD51C, RAD51D, RECQL, and TP53. Genetic testing did not reveal a deleterious mutation, however it detected a Variant of Unknown Significance in the RAD51Dgene called [id number].     (3) status post left breast lumpectomy on 12/31/2016: invasive ductal carcinoma, 1.8cm, grade 3, margins negative, 1 SLN negative, ER+(80%), PR-(2%), Ki-67 90%, HER-2 negative (ratio 1.05).  T1cN0  (4) chemotherapy consisting of cyclophosphamide and doxorubicin in dose dense fashion 4 (started 01/17/17), to be followed by Abraxane weekly 12.   (5) adjuvant radiation to follow as appropriate.  (6) febrile neutropenia; CURRENTLY: day 12 cycle 2 chemo, day 4 neupogen, day 5 antibiotics (vanco, cefepime) Still neutropenic, spiked last night to 102 Blood and urine cultures from 03/22 and repeat 03/24 negative On exam no localizing symptoms; port intact  Plan:  She is clinically not septic but continuing fevers worrisome. Will change from cefepime to meropenem to broaden coverage (anaerobics). Fungal infection unlikely given the brief period of neutropenia.  Anticipate WBC recovery next 48-72 hours  Greatly appreciate yur care of this patient. Will follow with you    Chauncey Cruel, MD 02/11/2017  7:34 AM Medical Oncology and Hematology [facility] [address] [place], [place] [address] Tel. [phone]    Fax. [phone]   .

## 2017-02-12 LAB — CBC WITH DIFFERENTIAL/PLATELET
BASOS ABS: 0 10*3/uL (ref 0.0–0.1)
Basophils Relative: 0 %
EOS PCT: 0 %
Eosinophils Absolute: 0 10*3/uL (ref 0.0–0.7)
HEMATOCRIT: 26.4 % — AB (ref 36.0–46.0)
HEMOGLOBIN: 8.9 g/dL — AB (ref 12.0–15.0)
LYMPHS PCT: 26 %
Lymphs Abs: 0.2 10*3/uL — ABNORMAL LOW (ref 0.7–4.0)
MCH: 27.6 pg (ref 26.0–34.0)
MCHC: 33.7 g/dL (ref 30.0–36.0)
MCV: 82 fL (ref 78.0–100.0)
MONOS PCT: 6 %
Monocytes Absolute: 0 10*3/uL — ABNORMAL LOW (ref 0.1–1.0)
NEUTROS ABS: 0.5 10*3/uL — AB (ref 1.7–7.7)
Neutrophils Relative %: 68 %
Platelets: 81 10*3/uL — ABNORMAL LOW (ref 150–400)
RBC: 3.22 MIL/uL — ABNORMAL LOW (ref 3.87–5.11)
RDW: 14.5 % (ref 11.5–15.5)
WBC: 0.7 10*3/uL — CL (ref 4.0–10.5)

## 2017-02-12 LAB — BASIC METABOLIC PANEL
ANION GAP: 7 (ref 5–15)
BUN: 22 mg/dL — ABNORMAL HIGH (ref 6–20)
CHLORIDE: 108 mmol/L (ref 101–111)
CO2: 24 mmol/L (ref 22–32)
Calcium: 8.7 mg/dL — ABNORMAL LOW (ref 8.9–10.3)
Creatinine, Ser: 1.17 mg/dL — ABNORMAL HIGH (ref 0.44–1.00)
GFR calc Af Amer: >60 mL/min (ref >60)
GFR calc non Af Amer: 53 mL/min — ABNORMAL LOW (ref >60)
GLUCOSE: 126 mg/dL — AB (ref 65–99)
POTASSIUM: 3.5 mmol/L (ref 3.5–5.1)
Sodium: 139 mmol/L (ref 135–145)

## 2017-02-12 LAB — GLUCOSE, CAPILLARY
GLUCOSE-CAPILLARY: 269 mg/dL — AB (ref 65–99)
GLUCOSE-CAPILLARY: 148 mg/dL — AB (ref 65–99)
Glucose-Capillary: 113 mg/dL — ABNORMAL HIGH (ref 65–99)
Glucose-Capillary: 184 mg/dL — ABNORMAL HIGH (ref 65–99)

## 2017-02-12 LAB — MAGNESIUM: MAGNESIUM: 2.2 mg/dL (ref 1.7–2.4)

## 2017-02-12 LAB — HEMOGLOBIN A1C
Hgb A1c MFr Bld: 7.8 % — ABNORMAL HIGH (ref 4.8–5.6)
Mean Plasma Glucose: 177 mg/dL

## 2017-02-12 MED ORDER — POTASSIUM CHLORIDE CRYS ER 20 MEQ PO TBCR
40.0000 meq | EXTENDED_RELEASE_TABLET | Freq: Once | ORAL | Status: AC
  Administered 2017-02-12: 40 meq via ORAL
  Filled 2017-02-12: qty 2

## 2017-02-12 NOTE — Progress Notes (Signed)
PROGRESS NOTE    Abigail Yoder  ASN:[id number] DOB: [birth year] DOA: 02/07/2017 PCP: [name], MD   Brief Narrative:  Abigail Yoder is a 51 y.o. female with medical history significant of recently diagnosed breast cancer currently undergoing chemotherapy, diabetes, hypertension, obesity, is being sent for direct admission from the [facility] due to fever. Patient tells me that over the last couple days she has been feeling extreme fatigue and weak with minimal activity. She also complained of chills. Yesterday. She was evaluated in the [facility] today, she was found to be febrile with a temperature of 100.8. Blood work revealed neutropenia, and she was directed for admission for IV antibiotics. She has no chest pain, denies any cough or chest congestion. She has no abdominal pain, no nausea or vomiting. She has had a few episodes of loose stools in the last couple days. She has no sore throat, but he is been complaining of a runny nose for the past 3-4 days. She also is complaining of diffuse gum "achiness" especially with solid food and warm liquids. It appears that she has not received her Neulasta following her last chemotherapy.   Assessment & Plan:   Principal Problem:   Neutropenic fever ([place]) Active Problems:   Type 2 diabetes mellitus (HCC)   Iron deficiency anemia   Essential hypertension   Obesity (BMI 30-39.9)   Malignant neoplasm of upper-inner quadrant of left breast in female, estrogen receptor positive ([place])   Hypomagnesemia   Trichomonas infection   Antineoplastic chemotherapy induced pancytopenia ([place])  #1 neutropenic fever Questionable etiology. Patient noted to have spiked a fever with a temp of 103 yesterday. Fever curve seems to be starting to trend down. WBC starting to improve slowly. Patient states some improvement with weakness than on admission and feels better than on admission however not at baseline. Patient has been pancultured results pending.  Repeat Chest x-ray negative for any acute abnormalities. Repeat UA with cultures and sensitivities negative leukocytes, negative nitrites. Repeat blood cultures 2 pending. Lower extremity Dopplers negative. IV cefepime has been discontinued and patient started on IV Merrem to broaden coverage for anaerobic 6 per her hematologist/oncologist. Hematology oncology seems to feel that fungal infections unlikely given brief period of neutropenia. Continue IV vancomycin. If patient still spiking fevers in the next 24-48 hours may need to add antifungal coverage. ID following and appreciate input and recommendations.    #2 Iron deficiency anemia Hemoglobin currently at 8.9 from 9.0 from 6.7 from 9.2 on 01/17/2017. s/p  2 units packed red blood cells. Follow H&H.   #3 chemotherapy-induced anemia and neutropenia and thrombocytopenia Patient denies any overt bleeding. Neutropenia slowly improving WBC now at 0.7 from 0.2. Hemoglobin currently at 8.9 from 9.0 from 6.7 from 7.0 from 9.2 on 01/17/2017, after transfusion of 2 units PRBC (02/10/2017).  Transfusion threshold hemoglobin less than 7. Patient did not receive Neulasta following her last chemotherapy. Continue on granix until [place] is greater than 1. Continue empiric IV antibiotics. Hematology/oncology following.  #4 diabetes mellitus type 2 Hemoglobin A1c 7.8. CBGs have ranged from 113-298. D/C'd ensure and boost and placed on glucerna. Increased Levemir to 20 units daily . Continue sliding scale insulin.  #5 hypomagnesemia Repleted.  #6 hypertension Blood pressure stable. Discontinued Cozaar and HCTZ secondary to increasing creatinine. Continue Norvasc to 10 mg daily.   #7 stage IA breast cancer Status post cycle 2 day 9 of treatment. Per hematology/oncology.  #8 Trichomonas in the urine Patient given a dose of  Flagyl 2 g by mouth 1.   DVT prophylaxis: SCDs Code Status: Full Family Communication: Updated patient. No family at  bedside. Disposition Plan: Home when afebrile for 24-48 hours, resolution of neutropenia and neutropenic fevers, clinical improvement.   Consultants:   Hematology/oncology: Dr Jana Hakim  Infectious disease: Dr. Baxter Flattery 02/09/2017  Procedures:   Chest x-ray 02/07/2017  Lower extremity Dopplers 02/10/2017  Transfusion 2 units packed red blood cells 02/10/2017  Antimicrobials:   IV vancomycin 02/07/2017>>>>   IV cefepime 02/07/2017>>02/11/2017  IV Merrem 02/11/2017   Subjective: Patient states she feels better than on admission. No chest pain. No shortness of breath. Patient noted to still be spiking fevers with a MAXIMUM TEMPERATURE of 103 yesterday.  Objective: Vitals:   02/11/17 2100 02/12/17 0517 02/12/17 0524 02/12/17 0700  BP: 138/74 132/73    Pulse: 99 80    Resp: 18 16    Temp: 100 F (37.8 C) 98.1 F (36.7 C)    TempSrc: Oral Oral    SpO2: 99% 100%    Weight:   98.6 kg (217 lb 6 oz) 93.9 kg (207 lb)  Height:        Intake/Output Summary (Last 24 hours) at 02/12/17 1030 Last data filed at 02/12/17 0525  Gross per 24 hour  Intake              750 ml  Output             1300 ml  Net             -550 ml   Filed Weights   02/11/17 0430 02/12/17 0524 02/12/17 0700  Weight: 96.4 kg (212 lb 8.4 oz) 98.6 kg (217 lb 6 oz) 93.9 kg (207 lb)    Examination:  General exam: Appears calm and comfortable.  Respiratory system: Clear to auscultation. Respiratory effort normal. Cardiovascular system: S1 & S2 heard, RRR. No JVD, murmurs, rubs, gallops or clicks. No pedal edema. Gastrointestinal system: Abdomen is nondistended, soft and nontender. No organomegaly or masses felt. Normal bowel sounds heard. Central nervous system: Alert and oriented. No focal neurological deficits. Extremities: Symmetric 5 x 5 power. Skin: No rashes, lesions or ulcers Psychiatry: Judgement and insight appear normal. Mood & affect appropriate.     Data Reviewed: I have personally  reviewed following labs and imaging studies  CBC:  Recent Labs Lab 02/08/17 0510 02/09/17 0405 02/10/17 0500 02/10/17 1700 02/11/17 0500 02/12/17 0500  WBC 0.3* 0.2* 0.2* 0.3* 0.2* 0.7*  NEUTROABS 0.0* 0.0* 0.0*  --  0.1* 0.5*  HGB 7.2* 7.0* 6.7* 9.0* 9.0* 8.9*  HCT 20.8* 20.4* 19.3* 25.9* 26.1* 26.4*  MCV 86.0 85.4 85.4 83.5 82.6 82.0  PLT 154 119* 65* 58* 61* 81*   Basic Metabolic Panel:  Recent Labs Lab 02/08/17 0510 02/09/17 0405 02/10/17 0500 02/11/17 0500 02/12/17 0500  NA 140 136 136 135 139  K 3.5 3.5 3.6 3.8 3.5  CL 108 107 107 106 108  CO2 25 25 24 23 24   GLUCOSE 79 162* 156* 253* 126*  BUN 19 15 17  22* 22*  CREATININE 1.22* 1.20* 1.46* 1.28* 1.17*  CALCIUM 7.4* 7.7* 8.1* 8.4* 8.7*  MG 1.0* 2.0  --  1.5* 2.2  PHOS 3.1  --   --   --   --    GFR: Estimated Creatinine Clearance: 62 mL/min (A) (by C-G formula based on SCr of 1.17 mg/dL (H)). Liver Function Tests:  Recent Labs Lab 02/07/17 0808 02/08/17 0510  AST 13  14*  ALT 14 13*  ALKPHOS 71 51  BILITOT 0.49 0.7  PROT 6.6 6.3*  ALBUMIN 2.8* 2.6*   No results for input(s): LIPASE, AMYLASE in the last 168 hours. No results for input(s): AMMONIA in the last 168 hours. Coagulation Profile: No results for input(s): INR, PROTIME in the last 168 hours. Cardiac Enzymes: No results for input(s): CKTOTAL, CKMB, CKMBINDEX, TROPONINI in the last 168 hours. BNP (last 3 results) No results for input(s): PROBNP in the last 8760 hours. HbA1C:  Recent Labs  02/11/17 0500  HGBA1C 7.8*   CBG:  Recent Labs Lab 02/11/17 0811 02/11/17 1227 02/11/17 1718 02/11/17 2159 02/12/17 0730  GLUCAP 207* 257* 160* 298* 113*   Lipid Profile: No results for input(s): CHOL, HDL, LDLCALC, TRIG, CHOLHDL, LDLDIRECT in the last 72 hours. Thyroid Function Tests: No results for input(s): TSH, T4TOTAL, FREET4, T3FREE, THYROIDAB in the last 72 hours. Anemia Panel: No results for input(s): VITAMINB12, FOLATE, FERRITIN,  TIBC, IRON, RETICCTPCT in the last 72 hours. Sepsis Labs: No results for input(s): PROCALCITON, LATICACIDVEN in the last 168 hours.  Recent Results (from the past 240 hour(s))  Culture, Blood     Status: None (Preliminary result)   Collection Time: 02/07/17  9:28 AM  Result Value Ref Range Status   BLOOD CULTURE, ROUTINE Preliminary report  Preliminary   RESULT 1 Comment  Preliminary    Comment: No growth in 36 - 48 hours.  Culture, Blood     Status: None (Preliminary result)   Collection Time: 02/07/17 10:05 AM  Result Value Ref Range Status   BLOOD CULTURE, ROUTINE Preliminary report  Preliminary   RESULT 1 Comment  Preliminary    Comment: No growth in 36 - 48 hours.  Urine Culture     Status: None   Collection Time: 02/07/17 10:05 AM  Result Value Ref Range Status   Urine Culture, Routine Final report  Final   Urine Culture result 1 Comment  Final    Comment: Mixed urogenital flora 10,000-25,000 colony forming units per mL   Culture, blood (single)     Status: None (Preliminary result)   Collection Time: 02/09/17  8:30 AM  Result Value Ref Range Status   Specimen Description BLOOD PORT  Final   Special Requests BOTTLES DRAWN AEROBIC AND ANAEROBIC 49ml  Final   Culture   Final    NO GROWTH 2 DAYS Performed at [facility], [place] [address], [place], [place] [address]    Report Status PENDING  Incomplete  Culture, blood (Routine X 2) w Reflex to ID Panel     Status: None (Preliminary result)   Collection Time: 02/09/17 10:02 AM  Result Value Ref Range Status   Specimen Description BLOOD LEFT ANTECUBITAL  Final   Special Requests BOTTLES DRAWN AEROBIC AND ANAEROBIC 10CC  Final   Culture   Final    NO GROWTH 2 DAYS Performed at [facility], [place] [address], [place], [place] [address]    Report Status PENDING  Incomplete  Culture, blood (Routine X 2) w Reflex to ID Panel     Status: None (Preliminary result)   Collection Time: 02/09/17 10:07 AM  Result Value Ref  Range Status   Specimen Description BLOOD RIGHT ARM  Final   Special Requests BOTTLES DRAWN AEROBIC AND ANAEROBIC 5CC  Final   Culture   Final    NO GROWTH 2 DAYS Performed at [facility], [place] [address], [place],  [address]  Report Status PENDING  Incomplete  Culture, Urine     Status: None   Collection Time: 02/09/17  1:00 PM  Result Value Ref Range Status   Specimen Description URINE, CLEAN CATCH  Final   Special Requests NONE  Final   Culture   Final    NO GROWTH Performed at [facility], [address], [place], [place] [address]    Report Status 02/10/2017 FINAL  Final         Radiology Studies: No results found.      Scheduled Meds: . amLODipine  10 mg Oral Daily  . feeding supplement (GLUCERNA SHAKE)  237 mL Oral TID BM  . insulin aspart  0-15 Units Subcutaneous TID WC  . insulin aspart  0-5 Units Subcutaneous QHS  . insulin detemir  20 Units Subcutaneous QHS  . lidocaine-prilocaine   Topical Once  . meropenem (MERREM) IV  1 g Intravenous Q8H  . multivitamin with minerals  1 tablet Oral Daily  . Tbo-filgastrim (GRANIX) SQ  480 mcg Subcutaneous q1800  . vancomycin  750 mg Intravenous Q12H   Continuous Infusions:   LOS: 5 days    Time spent: 40 minutes    THOMPSON,DANIEL, MD [facility] Pager [phone]  If 7PM-7AM, please contact night-coverage [email or web address] Password [email or web address] 02/12/2017, 10:30 AM

## 2017-02-12 NOTE — Progress Notes (Signed)
INFECTIOUS DISEASE PROGRESS NOTE  ID: Abigail Yoder is a 51 y.o. female with  Principal Problem:   Neutropenic fever ([place]) Active Problems:   Type 2 diabetes mellitus (HCC)   Iron deficiency anemia   Essential hypertension   Obesity (BMI 30-39.9)   Malignant neoplasm of upper-inner quadrant of left breast in female, estrogen receptor positive ([place])   Hypomagnesemia   Trichomonas infection   Antineoplastic chemotherapy induced pancytopenia (HCC)  Subjective: c/o gum tenderness.  No oral ulcers, no dysphagia, no sob, no cough, normal BM, normal urine.   Abtx:  Anti-infectives    Start     Dose/Rate Route Frequency Ordered Stop   02/11/17 0800  meropenem (MERREM) 1 g in sodium chloride 0.9 % 100 mL IVPB     1 g 200 mL/hr over 30 Minutes Intravenous Every 8 hours 02/11/17 0741     02/10/17 1600  vancomycin (VANCOCIN) IVPB 750 mg/150 ml premix     750 mg 150 mL/hr over 60 Minutes Intravenous Every 12 hours 02/10/17 1152     02/09/17 1600  vancomycin (VANCOCIN) IVPB 1000 mg/200 mL premix  Status:  Discontinued     1,000 mg 200 mL/hr over 60 Minutes Intravenous Every 12 hours 02/09/17 1459 02/10/17 1152   02/08/17 1000  vancomycin (VANCOCIN) IVPB 1000 mg/200 mL premix  Status:  Discontinued     1,000 mg 200 mL/hr over 60 Minutes Intravenous Every 12 hours 02/08/17 0853 02/09/17 1033   02/08/17 1000  metroNIDAZOLE (FLAGYL) tablet 2,000 mg     2,000 mg Oral  Once 02/08/17 0921 02/08/17 1038   02/07/17 1400  ceFEPIme (MAXIPIME) 2 g in dextrose 5 % 50 mL IVPB  Status:  Discontinued     2 g 100 mL/hr over 30 Minutes Intravenous Every 8 hours 02/07/17 1146 02/11/17 0741   02/07/17 1400  vancomycin (VANCOCIN) 1,500 mg in sodium chloride 0.9 % 500 mL IVPB  Status:  Discontinued     1,500 mg 250 mL/hr over 120 Minutes Intravenous Every 24 hours 02/07/17 1210 02/08/17 0853      Medications:  Scheduled: . amLODipine  10 mg Oral Daily  . feeding supplement (GLUCERNA SHAKE)  237 mL  Oral TID BM  . insulin aspart  0-15 Units Subcutaneous TID WC  . insulin aspart  0-5 Units Subcutaneous QHS  . insulin detemir  20 Units Subcutaneous QHS  . lidocaine-prilocaine   Topical Once  . meropenem (MERREM) IV  1 g Intravenous Q8H  . multivitamin with minerals  1 tablet Oral Daily  . Tbo-filgastrim (GRANIX) SQ  480 mcg Subcutaneous q1800  . vancomycin  750 mg Intravenous Q12H    Objective: Vital signs in last 24 hours: Temp:  [98.1 F (36.7 C)-100 F (37.8 C)] 99.7 F (37.6 C) (03/27 1418) Pulse Rate:  [80-99] 93 (03/27 1418) Resp:  [16-20] 20 (03/27 1418) BP: (132-138)/(67-74) 135/67 (03/27 1418) SpO2:  [99 %-100 %] 100 % (03/27 1418) Weight:  [93.9 kg (207 lb)-98.6 kg (217 lb 6 oz)] 93.9 kg (207 lb) (03/27 0700)   General appearance: alert, cooperative and no distress Throat: normal findings: oropharynx pink & moist without lesions or evidence of thrush Resp: clear to auscultation bilaterally Chest wall: R sided port clean, non-tender.  Cardio: regular rate and rhythm GI: normal findings: bowel sounds normal and soft, non-tender  Lab Results  Recent Labs  02/11/17 0500 02/12/17 0500  WBC 0.2* 0.7*  HGB 9.0* 8.9*  HCT 26.1* 26.4*  NA 135 139  K 3.8 3.5  CL 106 108  CO2 23 24  BUN 22* 22*  CREATININE 1.28* 1.17*   Liver Panel No results for input(s): PROT, ALBUMIN, AST, ALT, ALKPHOS, BILITOT, BILIDIR, IBILI in the last 72 hours. Sedimentation Rate No results for input(s): ESRSEDRATE in the last 72 hours. C-Reactive Protein No results for input(s): CRP in the last 72 hours.  Microbiology: Recent Results (from the past 240 hour(s))  Culture, Blood     Status: None (Preliminary result)   Collection Time: 02/07/17  9:28 AM  Result Value Ref Range Status   BLOOD CULTURE, ROUTINE Preliminary report  Preliminary   RESULT 1 Comment  Preliminary    Comment: No growth in 36 - 48 hours.  Culture, Blood     Status: None (Preliminary result)   Collection  Time: 02/07/17 10:05 AM  Result Value Ref Range Status   BLOOD CULTURE, ROUTINE Preliminary report  Preliminary   RESULT 1 Comment  Preliminary    Comment: No growth in 36 - 48 hours.  Urine Culture     Status: None   Collection Time: 02/07/17 10:05 AM  Result Value Ref Range Status   Urine Culture, Routine Final report  Final   Urine Culture result 1 Comment  Final    Comment: Mixed urogenital flora 10,000-25,000 colony forming units per mL   Culture, blood (single)     Status: None (Preliminary result)   Collection Time: 02/09/17  8:30 AM  Result Value Ref Range Status   Specimen Description BLOOD PORT  Final   Special Requests BOTTLES DRAWN AEROBIC AND ANAEROBIC 49ml  Final   Culture   Final    NO GROWTH 2 DAYS Performed at [facility], [place] [address] [place], [place] [address]    Report Status PENDING  Incomplete  Culture, blood (Routine X 2) w Reflex to ID Panel     Status: None (Preliminary result)   Collection Time: 02/09/17 10:02 AM  Result Value Ref Range Status   Specimen Description BLOOD LEFT ANTECUBITAL  Final   Special Requests BOTTLES DRAWN AEROBIC AND ANAEROBIC 10CC  Final   Culture   Final    NO GROWTH 2 DAYS Performed at [facility], [place] [address], [place], [place] [address]    Report Status PENDING  Incomplete  Culture, blood (Routine X 2) w Reflex to ID Panel     Status: None (Preliminary result)   Collection Time: 02/09/17 10:07 AM  Result Value Ref Range Status   Specimen Description BLOOD RIGHT ARM  Final   Special Requests BOTTLES DRAWN AEROBIC AND ANAEROBIC 5CC  Final   Culture   Final    NO GROWTH 2 DAYS Performed at [facility], [place] [address] [place], [place] [address]    Report Status PENDING  Incomplete  Culture, Urine     Status: None   Collection Time: 02/09/17  1:00 PM  Result Value Ref Range Status   Specimen Description URINE, CLEAN CATCH  Final   Special Requests NONE  Final   Culture   Final    NO  GROWTH Performed at [facility], [place] [address], [place], [place] [address]    Report Status 02/10/2017 FINAL  Final    Studies/Results: No results found.   Assessment/Plan: Neutropenic Fever ANC 500 GCSF Day 12 post CTX (doxorubicin/cyclophosphamide 3-15)  Breast CA (1a)  Total days of antibiotics: 6 vanco/merrem  Doing better- anc improved, fever better Will watch her  on anbx for 24-48 more hours Port looks ok She feels well and would like to go home           [place] (pager) [phone] [email or web address] 02/12/2017, 3:18 PM  LOS: 5 days

## 2017-02-12 NOTE — Progress Notes (Signed)
Pharmacy Antibiotic Note  Abigail Yoder is a 51 y.o. female with ER positive breast cancer, admitted 3/22 with febrile neutropenia following her second cycle of Doxorubicin and Cyclophosphamide (given 3/15).  Pharmacy has been consulted for vancomcyin dosing.  Day #6 antibiotics. ID consulted.  Patient remains febrile and neutropenic. - Vancomycin was discontinued yesterday morning then resumed later in day by ID for better gram positive coverage given dental condition. SCr increased today so will adjust dose. - Cefepime 2g IV q8h per MD, current dose remains appriopriate - Granix 480 mcg daily - started 3/22 (did not receive Neulasta after C2 of chemo). ANC improving now 0.5.  Plan: Continue vancomycin 750mg  q12 Check vanc trough tomorrow AM Hopeful to discontinue abx's soon as patient's fever curve appears to be improving  Height: 5\' 3"  (160 cm) Weight: 207 lb (93.9 kg) IBW/kg (Calculated) : 52.4  Temp (24hrs), Avg:99.3 F (37.4 C), Min:98.1 F (36.7 C), Max:100 F (37.8 C)   Recent Labs Lab 02/08/17 0510 02/09/17 0405 02/10/17 0500 02/10/17 1700 02/11/17 0500 02/12/17 0500  WBC 0.3* 0.2* 0.2* 0.3* 0.2* 0.7*  CREATININE 1.22* 1.20* 1.46*  --  1.28* 1.17*    Estimated Creatinine Clearance: 62 mL/min (A) (by C-G formula based on SCr of 1.17 mg/dL (H)).    Allergies  Allergen Reactions  . Pineapple Anaphylaxis and Hives    Antimicrobials this admission: 3/22 vanc >>  3/22 cefepime >> 3/23 Flagyl x 1 (+trich on urine)  Dose adjustments this admission: 3/23: changed vanc from 1500mg  q24 to 1g q12 for improved SCr 3/25 changed vanc from 1g q12h to 750mg  q12h for increased SCr  Microbiology results: 3/22 BCx: ngtd 3/22 UCx: 10-25K cfu/ml mixed urogenital flora  3/24 BCx x1 from central line:  3/24 BCx x 2:  3/24 UCx:   Thank you for allowing pharmacy to be a part of this patient's care.  Kara Mead 02/12/2017 2:55 PM

## 2017-02-13 LAB — GLUCOSE, CAPILLARY
GLUCOSE-CAPILLARY: 153 mg/dL — AB (ref 65–99)
GLUCOSE-CAPILLARY: 158 mg/dL — AB (ref 65–99)
Glucose-Capillary: 77 mg/dL (ref 65–99)
Glucose-Capillary: 130 mg/dL — ABNORMAL HIGH (ref 65–99)

## 2017-02-13 LAB — CBC WITH DIFFERENTIAL/PLATELET
Basophils Absolute: 0 10*3/uL (ref 0.0–0.1)
Basophils Relative: 1 %
EOS PCT: 1 %
Eosinophils Absolute: 0 10*3/uL (ref 0.0–0.7)
HEMATOCRIT: 24.7 % — AB (ref 36.0–46.0)
HEMOGLOBIN: 8.5 g/dL — AB (ref 12.0–15.0)
LYMPHS ABS: 0.4 10*3/uL — AB (ref 0.7–4.0)
Lymphocytes Relative: 24 %
MCH: 28.9 pg (ref 26.0–34.0)
MCHC: 34.4 g/dL (ref 30.0–36.0)
MCV: 84 fL (ref 78.0–100.0)
MONOS PCT: 10 %
Monocytes Absolute: 0.2 10*3/uL (ref 0.1–1.0)
Neutro Abs: 1 10*3/uL — ABNORMAL LOW (ref 1.7–7.7)
Neutrophils Relative %: 64 %
Platelets: 112 10*3/uL — ABNORMAL LOW (ref 150–400)
RBC: 2.94 MIL/uL — AB (ref 3.87–5.11)
RDW: 14.6 % (ref 11.5–15.5)
WBC: 1.6 10*3/uL — AB (ref 4.0–10.5)

## 2017-02-13 LAB — CULTURE, BLOOD (SINGLE)

## 2017-02-13 LAB — BASIC METABOLIC PANEL
ANION GAP: 5 (ref 5–15)
BUN: 21 mg/dL — ABNORMAL HIGH (ref 6–20)
CHLORIDE: 111 mmol/L (ref 101–111)
CO2: 25 mmol/L (ref 22–32)
CREATININE: 1.26 mg/dL — AB (ref 0.44–1.00)
Calcium: 8.6 mg/dL — ABNORMAL LOW (ref 8.9–10.3)
GFR, EST AFRICAN AMERICAN: 56 mL/min — AB (ref >60)
GFR, EST NON AFRICAN AMERICAN: 48 mL/min — AB (ref >60)
Glucose, Bld: 85 mg/dL (ref 65–99)
POTASSIUM: 4.2 mmol/L (ref 3.5–5.1)
Sodium: 141 mmol/L (ref 135–145)

## 2017-02-13 LAB — VANCOMYCIN, TROUGH: Vancomycin Tr: 27 ug/mL (ref 15–20)

## 2017-02-13 MED ORDER — VANCOMYCIN HCL IN DEXTROSE 1-5 GM/200ML-% IV SOLN: 1000.0000 mg | INTRAVENOUS | Status: DC

## 2017-02-13 MED ORDER — CIPROFLOXACIN HCL 500 MG PO TABS
500.0000 mg | ORAL_TABLET | Freq: Two times a day (BID) | ORAL | Status: DC
  Administered 2017-02-13 – 2017-02-14 (×2): 500 mg via ORAL
  Filled 2017-02-13 (×2): qty 1

## 2017-02-13 MED ORDER — AMOXICILLIN-POT CLAVULANATE 875-125 MG PO TABS
1.0000 | ORAL_TABLET | Freq: Two times a day (BID) | ORAL | Status: DC
  Administered 2017-02-13 – 2017-02-14 (×3): 1 via ORAL
  Filled 2017-02-13 (×3): qty 1

## 2017-02-13 MED ORDER — FLUCONAZOLE 100 MG PO TABS
150.0000 mg | ORAL_TABLET | Freq: Once | ORAL | Status: AC
  Administered 2017-02-13: 150 mg via ORAL
  Filled 2017-02-13: qty 2

## 2017-02-13 NOTE — Progress Notes (Signed)
[facility] Infusion Coordinator will follow [name] with ID for possible IV ABX if needed upon DC home.   If patient discharges after hours, please call [phone].   Abigail Yoder 02/13/2017, 10:07 AM

## 2017-02-13 NOTE — Progress Notes (Signed)
Pt having complaints of yeast infection symptoms that have been occurring for the past couple of days. Josephine Cables notified order obtained for Diflucan.

## 2017-02-13 NOTE — Progress Notes (Signed)
Pharmacy Antibiotic Note  Abigail Yoder is a 51 y.o. female admitted on 02/07/2017 with bacteremia.  Pharmacy has been consulted for Vancomycin dosing.  Plan: Change vancomycin to 1gm iv q24hr, next dose at 1600.  Goal trough 15-20  Height: 5\' 3"  (160 cm) Weight: 214 lb 11.7 oz (97.4 kg) IBW/kg (Calculated) : 52.4  Temp (24hrs), Avg:99.4 F (37.4 C), Min:98.9 F (37.2 C), Max:99.7 F (37.6 C)   Recent Labs Lab 02/09/17 0405 02/10/17 0500 02/10/17 1700 02/11/17 0500 02/12/17 0500 02/13/17 0300  WBC 0.2* 0.2* 0.3* 0.2* 0.7* 1.6*  CREATININE 1.20* 1.46*  --  1.28* 1.17* 1.26*  VANCOTROUGH  --   --   --   --   --  27*    Estimated Creatinine Clearance: 58.7 mL/min (A) (by C-G formula based on SCr of 1.26 mg/dL (H)).    Allergies  Allergen Reactions  . Pineapple Anaphylaxis and Hives    Antimicrobials this admission: 3/22 vanc >>  3/22 cefepime >> 3/26 3/23 Flagyl x 1 (+trich on urine) 3/26 meropenem >>  Dose adjustments this admission: 3/23: changed vanc from 1500mg  q24 to 1g q12 for improved SCr 3/25 changed vanc from 1g q12h to 750mg  q12h for increased SCr 3/28 0300 VT = _27__ on 750mg  q12--prior doses charted mostly correctly.--change to  1gm iv q24hr, next dose at 1600   Microbiology results: 3/22 BCx: ngtd 3/22 UCx: 10-25K cfu/ml mixed urogenital flora  3/24 BCx x1 from central line:  3/24 BCx x 2: ngtd 3/24 UCx: ngf    Thank you for allowing pharmacy to be a part of this patient's care.  Nani Skillern Crowford 02/13/2017 6:29 AM

## 2017-02-13 NOTE — Progress Notes (Signed)
CRITICAL VALUE ALERT  Critical value received:  Vanc trough-27  Date of notification:  02/13/17  Time of notification:  0350  Critical value read back:Yes.    Nurse who received alert:  S.Arless Vineyard   MD notified (1st page):  Pharmacy managing  Time of first page:    MD notified (2nd page):  Time of second page:  Responding MD:    Time MD responded:

## 2017-02-13 NOTE — Progress Notes (Signed)
PROGRESS NOTE    Abigail OLAES  Yoder:[id number] DOB: August 06, 1966 DOA: 02/07/2017 PCP: [name], MD   Brief Narrative:  Abigail Yoder is a 51 y.o. female with medical history significant of recently diagnosed breast cancer currently undergoing chemotherapy, diabetes, hypertension, obesity, is being sent for direct admission from the [facility] due to fever. Patient tells me that over the last couple days she has been feeling extreme fatigue and weak with minimal activity. She also complained of chills. Yesterday. She was evaluated in the [facility] today, she was found to be febrile with a temperature of 100.8. Blood work revealed neutropenia, and she was directed for admission for IV antibiotics. She has no chest pain, denies any cough or chest congestion. She has no abdominal pain, no nausea or vomiting. She has had a few episodes of loose stools in the last couple days. She has no sore throat, but he is been complaining of a runny nose for the past 3-4 days. She also is complaining of diffuse gum "achiness" especially with solid food and warm liquids. It appears that she has not received her Neulasta following her last chemotherapy.   Assessment & Plan:   Principal Problem:   Neutropenic fever ([place]) Active Problems:   Type 2 diabetes mellitus (HCC)   Iron deficiency anemia   Essential hypertension   Obesity (BMI 30-39.9)   Malignant neoplasm of upper-inner quadrant of left breast in female, estrogen receptor positive ([place])   Hypomagnesemia   Trichomonas infection   Antineoplastic chemotherapy induced pancytopenia (HCC)  Neutropenic fever -Unknown etiology at this time. Her cultures remain negative. -Currently she is on vancomycin and meropenem. This will be day 7. -Port site looks okay. -If she spikes fever again, we will have to start her on antifungal. Otherwise she can likely go home without any antibiotics for now. Will ask infectious disease prior to discharge. -Tylenol  if needed for fevers.  Iron deficiency anemia -Hemoglobin currently at 8.9 from 9.0 from 6.7 from 9.2 on 01/17/2017. s/p  2 units packed red blood cells. Follow H&H.  -Remained stable at this time CBC Latest Ref Rng & Units 02/13/2017 02/12/2017 02/11/2017  WBC 4.0 - 10.5 K/uL 1.6(L) 0.7(LL) 0.2(LL)  Hemoglobin 12.0 - 15.0 g/dL 8.5(L) 8.9(L) 9.0(L)  Hematocrit 36.0 - 46.0 % 24.7(L) 26.4(L) 26.1(L)  Platelets 150 - 400 K/uL 112(L) 81(L) 61(L)    Chemotherapy-induced anemia and neutropenia and thrombocytopenia Transfusion threshold hemoglobin less than 7. Patient did not receive Neulasta following her last chemotherapy. Continue on granix until [place] is greater than 1. Continue empiric IV antibiotics. Hematology/oncology following.  Diabetes mellitus type 2 Hemoglobin A1c 7.8. Increased Levemir to 20 units daily . Continue sliding scale insulin.  Hypomagnesemia Resolved  Hypertension Blood pressure stable. Discontinued Cozaar and HCTZ secondary to increasing creatinine. Continue Norvasc to 10 mg daily.   Stage IA breast cancer Status post cycle 2 day 9 of treatment. Per hematology/oncology.  Trichomonas in the urine Patient given a dose of Flagyl 2 g by mouth 1.   DVT prophylaxis: SCDs Code Status: Full Family Communication: Patient comprehends well.  Disposition Plan: If remains stable and afebrile. Can likely go home in next 24 hrs.    Consultants:   Hematology/oncology: Dr Jana Hakim  Infectious disease: Dr. Baxter Flattery 02/09/2017  Procedures:   Chest x-ray 02/07/2017  Lower extremity Dopplers 02/10/2017  Transfusion 2 units packed red blood cells 02/10/2017  Antimicrobials:   IV vancomycin 02/07/2017>>>>   IV cefepime 02/07/2017>>02/11/2017  IV Merrem 02/11/2017  Subjective: Patient remains afebrile. Doesn't have any complaints.   Objective: Vitals:   02/12/17 0700 02/12/17 1418 02/12/17 2043 02/13/17 0540  BP:  135/67 (!) 148/85 124/64  Pulse:  93 93 81    Resp:  20 18 16   Temp:  99.7 F (37.6 C) 99.6 F (37.6 C) 98.9 F (37.2 C)  TempSrc:  Oral Oral Oral  SpO2:  100% 100% 100%  Weight: 93.9 kg (207 lb)   97.4 kg (214 lb 11.7 oz)  Height:        Intake/Output Summary (Last 24 hours) at 02/13/17 1349 Last data filed at 02/13/17 0541  Gross per 24 hour  Intake              200 ml  Output              700 ml  Net             -500 ml   Filed Weights   02/12/17 0524 02/12/17 0700 02/13/17 0540  Weight: 98.6 kg (217 lb 6 oz) 93.9 kg (207 lb) 97.4 kg (214 lb 11.7 oz)    Examination:  General exam: Appears calm and comfortable.  Respiratory system: Clear to auscultation. Respiratory effort normal. Cardiovascular system: S1 & S2 heard, RRR. No JVD, murmurs, rubs, gallops or clicks. No pedal edema. Gastrointestinal system: Abdomen is nondistended, soft and nontender. No organomegaly or masses felt. Normal bowel sounds heard. Central nervous system: Alert and oriented. No focal neurological deficits. Extremities: Symmetric 5 x 5 power. Skin: No rashes, lesions or ulcers Psychiatry: Judgement and insight appear normal. Mood & affect appropriate.     Data Reviewed: I have personally reviewed following labs and imaging studies  CBC:  Recent Labs Lab 02/09/17 0405 02/10/17 0500 02/10/17 1700 02/11/17 0500 02/12/17 0500 02/13/17 0300  WBC 0.2* 0.2* 0.3* 0.2* 0.7* 1.6*  NEUTROABS 0.0* 0.0*  --  0.1* 0.5* 1.0*  HGB 7.0* 6.7* 9.0* 9.0* 8.9* 8.5*  HCT 20.4* 19.3* 25.9* 26.1* 26.4* 24.7*  MCV 85.4 85.4 83.5 82.6 82.0 84.0  PLT 119* 65* 58* 61* 81* 433*   Basic Metabolic Panel:  Recent Labs Lab 02/08/17 0510 02/09/17 0405 02/10/17 0500 02/11/17 0500 02/12/17 0500 02/13/17 0300  NA 140 136 136 135 139 141  K 3.5 3.5 3.6 3.8 3.5 4.2  CL 108 107 107 106 108 111  CO2 25 25 24 23 24 25   GLUCOSE 79 162* 156* 253* 126* 85  BUN 19 15 17  22* 22* 21*  CREATININE 1.22* 1.20* 1.46* 1.28* 1.17* 1.26*  CALCIUM 7.4* 7.7* 8.1* 8.4*  8.7* 8.6*  MG 1.0* 2.0  --  1.5* 2.2  --   PHOS 3.1  --   --   --   --   --    GFR: Estimated Creatinine Clearance: 58.7 mL/min (A) (by C-G formula based on SCr of 1.26 mg/dL (H)). Liver Function Tests:  Recent Labs Lab 02/07/17 0808 02/08/17 0510  AST 13 14*  ALT 14 13*  ALKPHOS 71 51  BILITOT 0.49 0.7  PROT 6.6 6.3*  ALBUMIN 2.8* 2.6*   No results for input(s): LIPASE, AMYLASE in the last 168 hours. No results for input(s): AMMONIA in the last 168 hours. Coagulation Profile: No results for input(s): INR, PROTIME in the last 168 hours. Cardiac Enzymes: No results for input(s): CKTOTAL, CKMB, CKMBINDEX, TROPONINI in the last 168 hours. BNP (last 3 results) No results for input(s): PROBNP in the last 8760 hours. HbA1C:  Recent Labs  02/11/17 0500  HGBA1C 7.8*   CBG:  Recent Labs Lab 02/12/17 1705 02/12/17 2131 02/13/17 0748 02/13/17 0946 02/13/17 1159  GLUCAP 269* 148* 77 130* 153*   Lipid Profile: No results for input(s): CHOL, HDL, LDLCALC, TRIG, CHOLHDL, LDLDIRECT in the last 72 hours. Thyroid Function Tests: No results for input(s): TSH, T4TOTAL, FREET4, T3FREE, THYROIDAB in the last 72 hours. Anemia Panel: No results for input(s): VITAMINB12, FOLATE, FERRITIN, TIBC, IRON, RETICCTPCT in the last 72 hours. Sepsis Labs: No results for input(s): PROCALCITON, LATICACIDVEN in the last 168 hours.  Recent Results (from the past 240 hour(s))  Culture, Blood     Status: None (Preliminary result)   Collection Time: 02/07/17  9:28 AM  Result Value Ref Range Status   BLOOD CULTURE, ROUTINE Preliminary report  Preliminary   RESULT 1 Comment  Preliminary    Comment: No growth in 36 - 48 hours.  Culture, Blood     Status: None (Preliminary result)   Collection Time: 02/07/17 10:05 AM  Result Value Ref Range Status   BLOOD CULTURE, ROUTINE Preliminary report  Preliminary   RESULT 1 Comment  Preliminary    Comment: No growth in 36 - 48 hours.  Urine Culture      Status: None   Collection Time: 02/07/17 10:05 AM  Result Value Ref Range Status   Urine Culture, Routine Final report  Final   Urine Culture result 1 Comment  Final    Comment: Mixed urogenital flora 10,000-25,000 colony forming units per mL   Culture, blood (single)     Status: None (Preliminary result)   Collection Time: 02/09/17  8:30 AM  Result Value Ref Range Status   Specimen Description BLOOD PORT  Final   Special Requests BOTTLES DRAWN AEROBIC AND ANAEROBIC 70ml  Final   Culture   Final    NO GROWTH 3 DAYS Performed at [facility], [place] [address], [place], [place] [address]    Report Status PENDING  Incomplete  Culture, blood (Routine X 2) w Reflex to ID Panel     Status: None (Preliminary result)   Collection Time: 02/09/17 10:02 AM  Result Value Ref Range Status   Specimen Description BLOOD LEFT ANTECUBITAL  Final   Special Requests BOTTLES DRAWN AEROBIC AND ANAEROBIC 10CC  Final   Culture   Final    NO GROWTH 3 DAYS Performed at [facility], [place] [address] [place], [place] [address]    Report Status PENDING  Incomplete  Culture, blood (Routine X 2) w Reflex to ID Panel     Status: None (Preliminary result)   Collection Time: 02/09/17 10:07 AM  Result Value Ref Range Status   Specimen Description BLOOD RIGHT ARM  Final   Special Requests BOTTLES DRAWN AEROBIC AND ANAEROBIC 5CC  Final   Culture   Final    NO GROWTH 3 DAYS Performed at [facility], [place] [address], [place], [place] [address]    Report Status PENDING  Incomplete  Culture, Urine     Status: None   Collection Time: 02/09/17  1:00 PM  Result Value Ref Range Status   Specimen Description URINE, CLEAN CATCH  Final   Special Requests NONE  Final   Culture   Final    NO GROWTH Performed at [facility], [place] [address], [place], [place] [address]    Report Status 02/10/2017 FINAL  Final         Radiology Studies: No results found.  Scheduled Meds: .  amLODipine  10 mg Oral Daily  . feeding supplement (GLUCERNA SHAKE)  237 mL Oral TID BM  . insulin aspart  0-15 Units Subcutaneous TID WC  . insulin aspart  0-5 Units Subcutaneous QHS  . insulin detemir  20 Units Subcutaneous QHS  . lidocaine-prilocaine   Topical Once  . meropenem (MERREM) IV  1 g Intravenous Q8H  . multivitamin with minerals  1 tablet Oral Daily  . Tbo-filgastrim (GRANIX) SQ  480 mcg Subcutaneous q1800  . vancomycin  1,000 mg Intravenous Q24H   Continuous Infusions:   LOS: 6 days    Time spent: 40 minutes    Derian Pfost Arsenio Loader, MD [facility] Pager [phone]  If 7PM-7AM, please contact night-coverage [email or web address] Password [email or web address] 02/13/2017, 1:49 PM

## 2017-02-13 NOTE — Care Management Note (Signed)
Case Management Note  Patient Details  Name: Abigail Yoder MRN: [id number] Date of Birth: 08/12/1966  Subjective/Objective:         51 yo admitted with Neutropenic fevers. Hx of breast CA.           Action/Plan: From home with spouse. Pt independent with ADLs prior to admission. Chart reviewed and CM following for any DC needs.  Expected Discharge Date:   (unknown)               Expected Discharge Plan:  Home/Self Care  In-House Referral:     Discharge planning Services  CM Consult  Post Acute Care Choice:    Choice offered to:     DME Arranged:    DME Agency:     HH Arranged:    HH Agency:     Status of Service:  In process, will continue to follow  If discussed at Long Length of Stay Meetings, dates discussed:    Additional Comments:  Lynnell Catalan, RN 02/13/2017, 1:10 PM  [phone]

## 2017-02-13 NOTE — Progress Notes (Addendum)
INFECTIOUS DISEASE PROGRESS NOTE  ID: Abigail Yoder is a 51 y.o. female with  Principal Problem:   Neutropenic fever ([place]) Active Problems:   Type 2 diabetes mellitus (HCC)   Iron deficiency anemia   Essential hypertension   Obesity (BMI 30-39.9)   Malignant neoplasm of upper-inner quadrant of left breast in female, estrogen receptor positive ([place])   Hypomagnesemia   Trichomonas infection   Antineoplastic chemotherapy induced pancytopenia (HCC)  Subjective: Without complaints, would like to go home.   Abtx:  Anti-infectives    Start     Dose/Rate Route Frequency Ordered Stop   02/13/17 1600  vancomycin (VANCOCIN) IVPB 1000 mg/200 mL premix     1,000 mg 200 mL/hr over 60 Minutes Intravenous Every 24 hours 02/13/17 0629     02/11/17 0800  meropenem (MERREM) 1 g in sodium chloride 0.9 % 100 mL IVPB     1 g 200 mL/hr over 30 Minutes Intravenous Every 8 hours 02/11/17 0741     02/10/17 1600  vancomycin (VANCOCIN) IVPB 750 mg/150 ml premix  Status:  Discontinued     750 mg 150 mL/hr over 60 Minutes Intravenous Every 12 hours 02/10/17 1152 02/13/17 0358   02/09/17 1600  vancomycin (VANCOCIN) IVPB 1000 mg/200 mL premix  Status:  Discontinued     1,000 mg 200 mL/hr over 60 Minutes Intravenous Every 12 hours 02/09/17 1459 02/10/17 1152   02/08/17 1000  vancomycin (VANCOCIN) IVPB 1000 mg/200 mL premix  Status:  Discontinued     1,000 mg 200 mL/hr over 60 Minutes Intravenous Every 12 hours 02/08/17 0853 02/09/17 1033   02/08/17 1000  metroNIDAZOLE (FLAGYL) tablet 2,000 mg     2,000 mg Oral  Once 02/08/17 0921 02/08/17 1038   02/07/17 1400  ceFEPIme (MAXIPIME) 2 g in dextrose 5 % 50 mL IVPB  Status:  Discontinued     2 g 100 mL/hr over 30 Minutes Intravenous Every 8 hours 02/07/17 1146 02/11/17 0741   02/07/17 1400  vancomycin (VANCOCIN) 1,500 mg in sodium chloride 0.9 % 500 mL IVPB  Status:  Discontinued     1,500 mg 250 mL/hr over 120 Minutes Intravenous Every 24 hours 02/07/17  1210 02/08/17 0853      Medications:  Scheduled: . amLODipine  10 mg Oral Daily  . feeding supplement (GLUCERNA SHAKE)  237 mL Oral TID BM  . insulin aspart  0-15 Units Subcutaneous TID WC  . insulin aspart  0-5 Units Subcutaneous QHS  . insulin detemir  20 Units Subcutaneous QHS  . lidocaine-prilocaine   Topical Once  . meropenem (MERREM) IV  1 g Intravenous Q8H  . multivitamin with minerals  1 tablet Oral Daily  . Tbo-filgastrim (GRANIX) SQ  480 mcg Subcutaneous q1800  . vancomycin  1,000 mg Intravenous Q24H    Objective: Vital signs in last 24 hours: Temp:  [98.9 F (37.2 C)-99.6 F (37.6 C)] 98.9 F (37.2 C) (03/28 0540) Pulse Rate:  [81-93] 81 (03/28 0540) Resp:  [16-18] 16 (03/28 0540) BP: (124-148)/(64-85) 124/64 (03/28 0540) SpO2:  [100 %] 100 % (03/28 0540) Weight:  [97.4 kg (214 lb 11.7 oz)] 97.4 kg (214 lb 11.7 oz) (03/28 0540)   General appearance: alert, cooperative and no distress Resp: clear to auscultation bilaterally Chest wall: port site is clean, non-tender, no fluctuance.  Cardio: regular rate and rhythm GI: normal findings: bowel sounds normal and soft, non-tender  Lab Results  Recent Labs  02/12/17 0500 02/13/17 0300  WBC 0.7* 1.6*  HGB 8.9* 8.5*  HCT 26.4* 24.7*  NA 139 141  K 3.5 4.2  CL 108 111  CO2 24 25  BUN 22* 21*  CREATININE 1.17* 1.26*   Liver Panel No results for input(s): PROT, ALBUMIN, AST, ALT, ALKPHOS, BILITOT, BILIDIR, IBILI in the last 72 hours. Sedimentation Rate No results for input(s): ESRSEDRATE in the last 72 hours. C-Reactive Protein No results for input(s): CRP in the last 72 hours.  Microbiology: Recent Results (from the past 240 hour(s))  Culture, Blood     Status: None   Collection Time: 02/07/17  9:28 AM  Result Value Ref Range Status   BLOOD CULTURE, ROUTINE Final report  Final   RESULT 1 Comment  Final    Comment: No aerobic or anaerobic growth in five days.  Culture, Blood     Status: None    Collection Time: 02/07/17 10:05 AM  Result Value Ref Range Status   BLOOD CULTURE, ROUTINE Final report  Final   RESULT 1 Comment  Final    Comment: No aerobic or anaerobic growth in five days.  Urine Culture     Status: None   Collection Time: 02/07/17 10:05 AM  Result Value Ref Range Status   Urine Culture, Routine Final report  Final   Urine Culture result 1 Comment  Final    Comment: Mixed urogenital flora 10,000-25,000 colony forming units per mL   Culture, blood (single)     Status: None (Preliminary result)   Collection Time: 02/09/17  8:30 AM  Result Value Ref Range Status   Specimen Description BLOOD PORT  Final   Special Requests BOTTLES DRAWN AEROBIC AND ANAEROBIC 81ml  Final   Culture   Final    NO GROWTH 3 DAYS Performed at [facility], [place] [address], [place], [place] [address]    Report Status PENDING  Incomplete  Culture, blood (Routine X 2) w Reflex to ID Panel     Status: None (Preliminary result)   Collection Time: 02/09/17 10:02 AM  Result Value Ref Range Status   Specimen Description BLOOD LEFT ANTECUBITAL  Final   Special Requests BOTTLES DRAWN AEROBIC AND ANAEROBIC 10CC  Final   Culture   Final    NO GROWTH 3 DAYS Performed at [facility], [place] [address], [place], [place] [address]    Report Status PENDING  Incomplete  Culture, blood (Routine X 2) w Reflex to ID Panel     Status: None (Preliminary result)   Collection Time: 02/09/17 10:07 AM  Result Value Ref Range Status   Specimen Description BLOOD RIGHT ARM  Final   Special Requests BOTTLES DRAWN AEROBIC AND ANAEROBIC 5CC  Final   Culture   Final    NO GROWTH 3 DAYS Performed at [facility], [place] [address] [place], [place] [address]    Report Status PENDING  Incomplete  Culture, Urine     Status: None   Collection Time: 02/09/17  1:00 PM  Result Value Ref Range Status   Specimen Description URINE, CLEAN CATCH  Final   Special Requests NONE  Final   Culture   Final    NO  GROWTH Performed at [facility], [place] [address] [place],  [address]    Report Status 02/10/2017 FINAL  Final    Studies/Results: No results found.   Assessment/Plan: Neutropenic Fever ANC 1000 GCSF Day 12 post CTX (doxorubicin/cyclophosphamide 3-15)  Breast CA (1a)  Total days of antibiotics: 7 vanco/merrem  Will change her to cipro/augmentin for 7 days Agree she can be d/c home if she remains stable.  Available as needed.          Bobby Rumpf Infectious Diseases (pager) [phone] [email or web address] 02/13/2017, 2:59 PM  LOS: 6 days

## 2017-02-14 ENCOUNTER — Ambulatory Visit: Payer: BLUE CROSS/BLUE SHIELD | Admitting: Adult Health

## 2017-02-14 ENCOUNTER — Other Ambulatory Visit: Payer: BLUE CROSS/BLUE SHIELD

## 2017-02-14 ENCOUNTER — Ambulatory Visit: Payer: BLUE CROSS/BLUE SHIELD

## 2017-02-14 LAB — CULTURE, BLOOD (ROUTINE X 2)
Culture: NO GROWTH
Culture: NO GROWTH

## 2017-02-14 LAB — GLUCOSE, CAPILLARY
GLUCOSE-CAPILLARY: 223 mg/dL — AB (ref 65–99)
Glucose-Capillary: 77 mg/dL (ref 65–99)

## 2017-02-14 LAB — COMPREHENSIVE METABOLIC PANEL
ALBUMIN: 2.4 g/dL — AB (ref 3.5–5.0)
ALK PHOS: 62 U/L (ref 38–126)
ALT: 16 U/L (ref 14–54)
ANION GAP: 6 (ref 5–15)
AST: 18 U/L (ref 15–41)
BILIRUBIN TOTAL: 0.3 mg/dL (ref 0.3–1.2)
BUN: 17 mg/dL (ref 6–20)
CO2: 24 mmol/L (ref 22–32)
Calcium: 8.5 mg/dL — ABNORMAL LOW (ref 8.9–10.3)
Chloride: 109 mmol/L (ref 101–111)
Creatinine, Ser: 1.06 mg/dL — ABNORMAL HIGH (ref 0.44–1.00)
GFR calc Af Amer: >60 mL/min (ref >60)
GFR calc non Af Amer: 60 mL/min — ABNORMAL LOW (ref >60)
GLUCOSE: 107 mg/dL — AB (ref 65–99)
POTASSIUM: 4.2 mmol/L (ref 3.5–5.1)
SODIUM: 139 mmol/L (ref 135–145)
TOTAL PROTEIN: 6.3 g/dL — AB (ref 6.5–8.1)

## 2017-02-14 LAB — CBC WITH DIFFERENTIAL/PLATELET
BASOS ABS: 0 10*3/uL (ref 0.0–0.1)
Basophils Relative: 0 %
EOS PCT: 0 %
Eosinophils Absolute: 0 10*3/uL (ref 0.0–0.7)
HCT: 25 % — ABNORMAL LOW (ref 36.0–46.0)
Hemoglobin: 8.4 g/dL — ABNORMAL LOW (ref 12.0–15.0)
Lymphocytes Relative: 13 %
Lymphs Abs: 0.5 10*3/uL — ABNORMAL LOW (ref 0.7–4.0)
MCH: 28.2 pg (ref 26.0–34.0)
MCHC: 33.6 g/dL (ref 30.0–36.0)
MCV: 83.9 fL (ref 78.0–100.0)
MONOS PCT: 7 %
Monocytes Absolute: 0.3 10*3/uL (ref 0.1–1.0)
NEUTROS PCT: 80 %
Neutro Abs: 3.1 10*3/uL (ref 1.7–7.7)
PLATELETS: 158 10*3/uL (ref 150–400)
RBC: 2.98 MIL/uL — AB (ref 3.87–5.11)
RDW: 14.3 % (ref 11.5–15.5)
WBC: 3.9 10*3/uL — AB (ref 4.0–10.5)

## 2017-02-14 LAB — CULTURE, BLOOD (SINGLE): CULTURE: NO GROWTH

## 2017-02-14 LAB — MAGNESIUM: Magnesium: 1.5 mg/dL — ABNORMAL LOW (ref 1.7–2.4)

## 2017-02-14 MED ORDER — SACCHAROMYCES BOULARDII 250 MG PO CAPS: 250.0000 mg | ORAL_CAPSULE | Freq: Two times a day (BID) | ORAL | 0 refills | Status: DC

## 2017-02-14 MED ORDER — SACCHAROMYCES BOULARDII 250 MG PO CAPS: 250.0000 mg | ORAL_CAPSULE | Freq: Two times a day (BID) | ORAL | Status: DC

## 2017-02-14 MED ORDER — AMOXICILLIN-POT CLAVULANATE 875-125 MG PO TABS: 1.0000 | ORAL_TABLET | Freq: Two times a day (BID) | ORAL | 0 refills | Status: AC

## 2017-02-14 MED ORDER — CIPROFLOXACIN HCL 500 MG PO TABS: 500.0000 mg | ORAL_TABLET | Freq: Two times a day (BID) | ORAL | 0 refills | Status: AC

## 2017-02-14 MED ORDER — HEPARIN SOD (PORK) LOCK FLUSH 100 UNIT/ML IV SOLN
500.0000 [IU] | INTRAVENOUS | Status: AC | PRN
  Administered 2017-02-14: 500 [IU]
  Filled 2017-02-14: qty 5

## 2017-02-14 NOTE — Progress Notes (Signed)
Patient's d/c instructions rendered. Understood teachings re: meds,follow-up and where to pick up meds. All questions answered satisfactorily.Patient in good spirit. Stable. Port a cath deaccessed per protocol, site unremarkable.

## 2017-02-14 NOTE — Discharge Summary (Signed)
Physician Discharge Summary  Abigail Yoder KGM:[id number] DOB: 1966-08-06 DOA: 02/07/2017  PCP: [name], MD  Admit date: 02/07/2017 Discharge date: 02/14/2017  Admitted From: Home Disposition: Home  Recommendations for Outpatient Follow-up:  1. Follow up with PCP in 1-2 weeks 2. Follow up with Dr Jana Hakim on 02/21/17  3. Take Cipro and Augmentin for 9 more days.   Home Health: No Equipment/Devices: No  Discharge Condition: Stable CODE STATUS: Full  Diet recommendation: Resume home diet.   Brief/Interim Summary: 51 year old female with past medical history of breast cancer currently undergoing chemotherapy, diabetes, hypertension, obesity was sent to the hospital for admission from her cancer Center due to fevers. She was admitted for neutropenic fever and broad-spectrum antibiotics were started cultures were performed, infectious disease and oncology were consulted. At first she was started on vancomycin and cefepime which was later switched to vancomycin and meropenem. During her hospital stay initially she did have low-grade fevers but no infectious etiology was identified. There were thoughts of maybe starting her on antifungal but then her fevers improved therefore was never started. Due to anemia likely from her chemotherapy she had received 2 units of PRBC and thereafter her hemoglobin remained stable around 8.5. Her Port-A-Cath site looked okay as well without any infection. Slowly her [place] started to rise and did not spike any more fevers. With the help of infectious disease was determined that her antibiotics can be switched to Augmentin and ciprofloxacin. Probiotic was added. She was asked to finish about 9 days of antibiotics and follow-up outpatient with Dr. Billy Fischer on 02/20/2017.  During her hospital stay her blood pressure remained stable and she was taken off of losartan and hydrochlorothiazide due to slightly elevated kidney function. Her blood pressure remained stable on Norvasc.  At the time of discharge we will only continue Norvasc for blood pressure and losartan and hydrochlorothiazide can be slowly re-added outpatient depending on her blood pressure. Today she has reached maximum benefit from inpatient hospital stay and is deemed stable to be discharged with outpatient follow-up as noted above.   Discharge Diagnoses:  Principal Problem:   Neutropenic fever ([place]) Active Problems:   Type 2 diabetes mellitus (HCC)   Iron deficiency anemia   Essential hypertension   Obesity (BMI 30-39.9)   Malignant neoplasm of upper-inner quadrant of left breast in female, estrogen receptor positive ([place])   Hypomagnesemia   Trichomonas infection   Antineoplastic chemotherapy induced pancytopenia (HCC)  Neutropenic fever -Unknown etiology at this time. Her cultures remain negative. -Currently she is on vancomycin and meropenem. This will be day 7. Augmentin/Cipro for 9 more day. Added probiotic.  -Tylenol if needed for fevers.  Iron deficiency anemia -Hemoglobin currently at 8.4 from 9.0 from 6.7 from 9.2 on 01/17/2017. s/p  2 units packed red blood cells. Follow H&H.  -Remained stable at this time CBC Latest Ref Rng & Units 02/14/2017 02/13/2017 02/12/2017  WBC 4.0 - 10.5 K/uL 3.9(L) 1.6(L) 0.7(LL)  Hemoglobin 12.0 - 15.0 g/dL 8.4(L) 8.5(L) 8.9(L)  Hematocrit 36.0 - 46.0 % 25.0(L) 24.7(L) 26.4(L)  Platelets 150 - 400 K/uL 158 112(L) 81(L)    Chemotherapy-induced anemia and neutropenia and thrombocytopenia Transfusion threshold hemoglobin less than 7. Patient did not receive Neulasta following her last chemotherapy. Continue on granix until [place] is greater than 1.  Follow up outpatient with Dr Jana Hakim on 02/21/17  Diabetes mellitus type 2 Hemoglobin A1c 7.8. Increased Levemir to 20 units daily . Continue sliding scale insulin.  Hypomagnesemia Resolved  Hypertension Blood pressure  remains stable. Discontinued Cozaar and HCTZ secondary to increasing creatinine.  Continue Norvasc to 10 mg daily.   Stage IA breast cancer Status post cycle 2 day 9 of treatment. Per hematology/oncology.     Discharge Instructions   Allergies as of 02/14/2017      Reactions   Pineapple Anaphylaxis, Hives      Medication List    STOP taking these medications   dexamethasone 4 MG tablet Commonly known as:  DECADRON   losartan-hydrochlorothiazide 100-25 MG tablet Commonly known as:  HYZAAR     TAKE these medications   amLODipine 5 MG tablet Commonly known as:  NORVASC Take 5 mg by mouth daily.   amoxicillin-clavulanate 875-125 MG tablet Commonly known as:  AUGMENTIN Take 1 tablet by mouth every 12 (twelve) hours.   ciprofloxacin 500 MG tablet Commonly known as:  CIPRO Take 1 tablet (500 mg total) by mouth 2 (two) times daily.   insulin aspart protamine- aspart (70-30) 100 UNIT/ML injection Commonly known as:  NOVOLOG MIX 70/30 Inject 20 Units into the skin 2 (two) times daily with a meal.   lidocaine-prilocaine cream Commonly known as:  EMLA Apply to affected area once   LORazepam 0.5 MG tablet Commonly known as:  ATIVAN Take 1 tablet (0.5 mg total) by mouth every 6 (six) hours as needed (Nausea or vomiting).   metFORMIN 1000 MG tablet Commonly known as:  GLUCOPHAGE Take 1,000 mg by mouth 2 (two) times daily with a meal.   prochlorperazine 10 MG tablet Commonly known as:  COMPAZINE Take 1 tablet (10 mg total) by mouth every 6 (six) hours as needed (Nausea or vomiting).   saccharomyces boulardii 250 MG capsule Commonly known as:  FLORASTOR Take 1 capsule (250 mg total) by mouth 2 (two) times daily.      Follow-up Information    Chauncey Cruel, MD Follow up on 02/21/2017.   Specialty:  Oncology Contact information: [address] [place] [address] [phone]          Allergies  Allergen Reactions  . Pineapple Anaphylaxis and Hives    Consultations:  Oncology   Infectious Disease     Procedures/Studies: Dg Chest 2 View  Result Date: 02/09/2017 CLINICAL DATA:  Patient with fever.  History of breast cancer. EXAM: CHEST  2 VIEW COMPARISON:  Chest radiograph 02/07/2017 FINDINGS: Right anterior chest wall Port-A-Cath is present tip projecting over the superior vena cava. Anterior cervical spinal fusion hardware. Normal cardiac and mediastinal contours. No consolidative pulmonary opacities. No pleural effusion or pneumothorax. Regional skeleton is unremarkable. IMPRESSION: No active cardiopulmonary disease. Electronically Signed   By: Lovey Newcomer M.D.   On: 02/09/2017 10:19   Dg Chest 2 View  Result Date: 02/07/2017 CLINICAL DATA:  Neutropenic fever EXAM: CHEST  2 VIEW COMPARISON:  12/31/2016 FINDINGS: Lungs are clear without infiltrate effusion or mass. Negative for pneumonia. Heart size normal. Port-A-Cath tip in the lower SVC unchanged in position. IMPRESSION: No active cardiopulmonary disease. Electronically Signed   By: Franchot Gallo M.D.   On: 02/07/2017 14:16       Subjective:   Discharge Exam: Vitals:   02/14/17 0515 02/14/17 0947  BP: 131/67 136/70  Pulse: 82 80  Resp: 16   Temp: 98.4 F (36.9 C)    Vitals:   02/13/17 1554 02/13/17 2150 02/14/17 0515 02/14/17 0947  BP: 120/62 (!) 101/45 131/67 136/70  Pulse: 91 90 82 80  Resp: 18 16 16    Temp: 98.7 F (37.1 C) 98.7  F (37.1 C) 98.4 F (36.9 C)   TempSrc: Oral Oral Oral   SpO2: 100% 99% 100%   Weight:   97.6 kg (215 lb 2.7 oz)   Height:        General: Pt is alert, awake, not in acute distress Cardiovascular: RRR, S1/S2 +, no rubs, no gallops Respiratory: CTA bilaterally, no wheezing, no rhonchi Abdominal: Soft, NT, ND, bowel sounds + Extremities: no edema, no cyanosis    The results of significant diagnostics from this hospitalization (including imaging, microbiology, ancillary and laboratory) are listed below for reference.     Microbiology: Recent Results (from the past 240 hour(s))   Culture, Blood     Status: None   Collection Time: 02/07/17  9:28 AM  Result Value Ref Range Status   BLOOD CULTURE, ROUTINE Final report  Final   RESULT 1 Comment  Final    Comment: No aerobic or anaerobic growth in five days.  Culture, Blood     Status: None   Collection Time: 02/07/17 10:05 AM  Result Value Ref Range Status   BLOOD CULTURE, ROUTINE Final report  Final   RESULT 1 Comment  Final    Comment: No aerobic or anaerobic growth in five days.  Urine Culture     Status: None   Collection Time: 02/07/17 10:05 AM  Result Value Ref Range Status   Urine Culture, Routine Final report  Final   Urine Culture result 1 Comment  Final    Comment: Mixed urogenital flora 10,000-25,000 colony forming units per mL   Culture, blood (single)     Status: None (Preliminary result)   Collection Time: 02/09/17  8:30 AM  Result Value Ref Range Status   Specimen Description BLOOD PORT  Final   Special Requests BOTTLES DRAWN AEROBIC AND ANAEROBIC 18ml  Final   Culture   Final    NO GROWTH 4 DAYS Performed at [facility] [address], [place], [place] [address]    Report Status PENDING  Incomplete  Culture, blood (Routine X 2) w Reflex to ID Panel     Status: None (Preliminary result)   Collection Time: 02/09/17 10:02 AM  Result Value Ref Range Status   Specimen Description BLOOD LEFT ANTECUBITAL  Final   Special Requests BOTTLES DRAWN AEROBIC AND ANAEROBIC 10CC  Final   Culture   Final    NO GROWTH 4 DAYS Performed at [facility], [place] [address], [place], [place] [address]    Report Status PENDING  Incomplete  Culture, blood (Routine X 2) w Reflex to ID Panel     Status: None (Preliminary result)   Collection Time: 02/09/17 10:07 AM  Result Value Ref Range Status   Specimen Description BLOOD RIGHT ARM  Final   Special Requests BOTTLES DRAWN AEROBIC AND ANAEROBIC 5CC  Final   Culture   Final    NO GROWTH 4 DAYS Performed at [facility], [place] [address],  [place], [place] [address]    Report Status PENDING  Incomplete  Culture, Urine     Status: None   Collection Time: 02/09/17  1:00 PM  Result Value Ref Range Status   Specimen Description URINE, CLEAN CATCH  Final   Special Requests NONE  Final   Culture   Final    NO GROWTH Performed at [facility], [place] [address] [place],  [address]    Report Status 02/10/2017 FINAL  Final     Labs: BNP (last  3 results) No results for input(s): BNP in the last 8760 hours. Basic Metabolic Panel:  Recent Labs Lab 02/08/17 0510 02/09/17 0405 02/10/17 0500 02/11/17 0500 02/12/17 0500 02/13/17 0300 02/14/17 0453  NA 140 136 136 135 139 141 139  K 3.5 3.5 3.6 3.8 3.5 4.2 4.2  CL 108 107 107 106 108 111 109  CO2 25 25 24 23 24 25 24   GLUCOSE 79 162* 156* 253* 126* 85 107*  BUN 19 15 17  22* 22* 21* 17  CREATININE 1.22* 1.20* 1.46* 1.28* 1.17* 1.26* 1.06*  CALCIUM 7.4* 7.7* 8.1* 8.4* 8.7* 8.6* 8.5*  MG 1.0* 2.0  --  1.5* 2.2  --  1.5*  PHOS 3.1  --   --   --   --   --   --    Liver Function Tests:  Recent Labs Lab 02/08/17 0510 02/14/17 0453  AST 14* 18  ALT 13* 16  ALKPHOS 51 62  BILITOT 0.7 0.3  PROT 6.3* 6.3*  ALBUMIN 2.6* 2.4*   No results for input(s): LIPASE, AMYLASE in the last 168 hours. No results for input(s): AMMONIA in the last 168 hours. CBC:  Recent Labs Lab 02/10/17 0500 02/10/17 1700 02/11/17 0500 02/12/17 0500 02/13/17 0300 02/14/17 0453  WBC 0.2* 0.3* 0.2* 0.7* 1.6* 3.9*  NEUTROABS 0.0*  --  0.1* 0.5* 1.0* 3.1  HGB 6.7* 9.0* 9.0* 8.9* 8.5* 8.4*  HCT 19.3* 25.9* 26.1* 26.4* 24.7* 25.0*  MCV 85.4 83.5 82.6 82.0 84.0 83.9  PLT 65* 58* 61* 81* 112* 158   Cardiac Enzymes: No results for input(s): CKTOTAL, CKMB, CKMBINDEX, TROPONINI in the last 168 hours. BNP: Invalid input(s): POCBNP CBG:  Recent Labs Lab 02/13/17 0946 02/13/17 1159 02/13/17 1739 02/13/17 2148 02/14/17 0806  GLUCAP 130* 153* 158* 223* 77   D-Dimer No results for  input(s): DDIMER in the last 72 hours. Hgb A1c No results for input(s): HGBA1C in the last 72 hours. Lipid Profile No results for input(s): CHOL, HDL, LDLCALC, TRIG, CHOLHDL, LDLDIRECT in the last 72 hours. Thyroid function studies No results for input(s): TSH, T4TOTAL, T3FREE, THYROIDAB in the last 72 hours.  Invalid input(s): FREET3 Anemia work up No results for input(s): VITAMINB12, FOLATE, FERRITIN, TIBC, IRON, RETICCTPCT in the last 72 hours. Urinalysis    Component Value Date/Time   COLORURINE YELLOW 02/09/2017 1300   APPEARANCEUR HAZY (A) 02/09/2017 1300   LABSPEC 1.013 02/09/2017 1300   LABSPEC 1.010 02/07/2017 1005   PHURINE 5.0 02/09/2017 1300   GLUCOSEU NEGATIVE 02/09/2017 1300   GLUCOSEU Negative 02/07/2017 1005   HGBUR NEGATIVE 02/09/2017 1300   BILIRUBINUR NEGATIVE 02/09/2017 1300   BILIRUBINUR Negative 02/07/2017 1005   KETONESUR NEGATIVE 02/09/2017 1300   PROTEINUR 100 (A) 02/09/2017 1300   UROBILINOGEN 0.2 02/07/2017 1005   NITRITE NEGATIVE 02/09/2017 1300   LEUKOCYTESUR NEGATIVE 02/09/2017 1300   LEUKOCYTESUR Negative 02/07/2017 1005   Sepsis Labs Invalid input(s): PROCALCITONIN,  WBC,  LACTICIDVEN Microbiology Recent Results (from the past 240 hour(s))  Culture, Blood     Status: None   Collection Time: 02/07/17  9:28 AM  Result Value Ref Range Status   BLOOD CULTURE, ROUTINE Final report  Final   RESULT 1 Comment  Final    Comment: No aerobic or anaerobic growth in five days.  Culture, Blood     Status: None   Collection Time: 02/07/17 10:05 AM  Result Value Ref Range Status   BLOOD CULTURE, ROUTINE Final report  Final   RESULT 1  Comment  Final    Comment: No aerobic or anaerobic growth in five days.  Urine Culture     Status: None   Collection Time: 02/07/17 10:05 AM  Result Value Ref Range Status   Urine Culture, Routine Final report  Final   Urine Culture result 1 Comment  Final    Comment: Mixed urogenital flora 10,000-25,000 colony forming  units per mL   Culture, blood (single)     Status: None (Preliminary result)   Collection Time: 02/09/17  8:30 AM  Result Value Ref Range Status   Specimen Description BLOOD PORT  Final   Special Requests BOTTLES DRAWN AEROBIC AND ANAEROBIC 41ml  Final   Culture   Final    NO GROWTH 4 DAYS Performed at [facility] [address], [place], [place] [address]    Report Status PENDING  Incomplete  Culture, blood (Routine X 2) w Reflex to ID Panel     Status: None (Preliminary result)   Collection Time: 02/09/17 10:02 AM  Result Value Ref Range Status   Specimen Description BLOOD LEFT ANTECUBITAL  Final   Special Requests BOTTLES DRAWN AEROBIC AND ANAEROBIC 10CC  Final   Culture   Final    NO GROWTH 4 DAYS Performed at [facility], [place] [address] [place], [place] [address]    Report Status PENDING  Incomplete  Culture, blood (Routine X 2) w Reflex to ID Panel     Status: None (Preliminary result)   Collection Time: 02/09/17 10:07 AM  Result Value Ref Range Status   Specimen Description BLOOD RIGHT ARM  Final   Special Requests BOTTLES DRAWN AEROBIC AND ANAEROBIC 5CC  Final   Culture   Final    NO GROWTH 4 DAYS Performed at [facility], [place] [address] [place], [place] [address]    Report Status PENDING  Incomplete  Culture, Urine     Status: None   Collection Time: 02/09/17  1:00 PM  Result Value Ref Range Status   Specimen Description URINE, CLEAN CATCH  Final   Special Requests NONE  Final   Culture   Final    NO GROWTH Performed at [facility], [place] [address], [place], [place] [address]    Report Status 02/10/2017 FINAL  Final     Time coordinating discharge: Over 30 minutes  SIGNED:   Damita Lack, MD  [facility] 02/14/2017, 10:20 AM Pager   If 7PM-7AM, please contact night-coverage [email or web address] Password [email or web address]

## 2017-02-14 NOTE — Progress Notes (Signed)
Abigail Yoder   DOB:[birth year]   SN#:[id number]   LPF#:[id number]  Subjective: ambulating in halls; having loose (unformed) BMs, 3 this AM so far; rest of ROS is benign; no family in room   Objective: middle aged African American woman examined in recliner Vitals:   02/13/17 2150 02/14/17 0515  BP: (!) 101/45 131/67  Pulse: 90 82  Resp: 16 16  Temp: 98.7 F (37.1 C) 98.4 F (36.9 C)    Body mass index is 38.12 kg/m.  Intake/Output Summary (Last 24 hours) at 02/14/17 0739 Last data filed at 02/14/17 0516  Gross per 24 hour  Intake              240 ml  Output             1100 ml  Net             -860 ml    Sclerae unicteric, EOMs intact Oropharynx clear and moist, no thrush or other lesions No cervical or supraclavicular adenopathy Lungs no rales or rhonchi Heart regular rate and rhythm Abd soft, nontender, positive bowel sounds MSK .no upper extremity lymphedema Neuro: nonfocal, well oriented, appropriate affect Breasts: Deferred    CBG (last 3)   Recent Labs  02/13/17 1159 02/13/17 1739 02/13/17 2148  GLUCAP 153* 158* 223*     Labs:  Lab Results  Component Value Date   WBC 3.9 (L) 02/14/2017   HGB 8.4 (L) 02/14/2017   HCT 25.0 (L) 02/14/2017   MCV 83.9 02/14/2017   PLT 158 02/14/2017   NEUTROABS 3.1 02/14/2017    '@LASTCHEMISTRY' @  Urine Studies No results for input(s): UHGB, CRYS in the last 72 hours.  Invalid input(s): UACOL, UAPR, USPG, UPH, UTP, UGL, UKET, UBIL, UNIT, UROB, [place], UEPI, UWBC, [place], [place], [place], [place], [place]  Basic Metabolic Panel:  Recent Labs Lab 02/08/17 0510 02/09/17 0405 02/10/17 0500 02/11/17 0500 02/12/17 0500 02/13/17 0300 02/14/17 0453  NA 140 136 136 135 139 141 139  K 3.5 3.5 3.6 3.8 3.5 4.2 4.2  CL 108 107 107 106 108 111 109  CO2 '25 25 24 23 24 25 24  ' GLUCOSE 79 162* 156* 253* 126* 85 107*  BUN '19 15 17 ' 22* 22* 21* 17  CREATININE 1.22* 1.20* 1.46* 1.28* 1.17* 1.26* 1.06*  CALCIUM 7.4* 7.7* 8.1* 8.4* 8.7*  8.6* 8.5*  MG 1.0* 2.0  --  1.5* 2.2  --  1.5*  PHOS 3.1  --   --   --   --   --   --    GFR Estimated Creatinine Clearance: 69.9 mL/min (A) (by C-G formula based on SCr of 1.06 mg/dL (H)). Liver Function Tests:  Recent Labs Lab 02/07/17 0808 02/08/17 0510 02/14/17 0453  AST 13 14* 18  ALT 14 13* 16  ALKPHOS 71 51 62  BILITOT 0.49 0.7 0.3  PROT 6.6 6.3* 6.3*  ALBUMIN 2.8* 2.6* 2.4*   No results for input(s): LIPASE, AMYLASE in the last 168 hours. No results for input(s): AMMONIA in the last 168 hours. Coagulation profile No results for input(s): INR, PROTIME in the last 168 hours.  CBC:  Recent Labs Lab 02/10/17 0500 02/10/17 1700 02/11/17 0500 02/12/17 0500 02/13/17 0300 02/14/17 0453  WBC 0.2* 0.3* 0.2* 0.7* 1.6* 3.9*  NEUTROABS 0.0*  --  0.1* 0.5* 1.0* 3.1  HGB 6.7* 9.0* 9.0* 8.9* 8.5* 8.4*  HCT 19.3* 25.9* 26.1* 26.4* 24.7* 25.0*  MCV 85.4 83.5 82.6 82.0 84.0 83.9  PLT 65*  58* 61* 81* 112* 158   Cardiac Enzymes: No results for input(s): CKTOTAL, CKMB, CKMBINDEX, TROPONINI in the last 168 hours. BNP: Invalid input(s): POCBNP CBG:  Recent Labs Lab 02/13/17 0748 02/13/17 0946 02/13/17 1159 02/13/17 1739 02/13/17 2148  GLUCAP 77 130* 153* 158* 223*   D-Dimer No results for input(s): DDIMER in the last 72 hours. Hgb A1c No results for input(s): HGBA1C in the last 72 hours. Lipid Profile No results for input(s): CHOL, HDL, LDLCALC, TRIG, CHOLHDL, LDLDIRECT in the last 72 hours. Thyroid function studies No results for input(s): TSH, T4TOTAL, T3FREE, THYROIDAB in the last 72 hours.  Invalid input(s): FREET3 Anemia work up No results for input(s): VITAMINB12, FOLATE, FERRITIN, TIBC, IRON, RETICCTPCT in the last 72 hours. Microbiology Recent Results (from the past 240 hour(s))  Culture, Blood     Status: None   Collection Time: 02/07/17  9:28 AM  Result Value Ref Range Status   BLOOD CULTURE, ROUTINE Final report  Final   RESULT 1 Comment  Final     Comment: No aerobic or anaerobic growth in five days.  Culture, Blood     Status: None   Collection Time: 02/07/17 10:05 AM  Result Value Ref Range Status   BLOOD CULTURE, ROUTINE Final report  Final   RESULT 1 Comment  Final    Comment: No aerobic or anaerobic growth in five days.  Urine Culture     Status: None   Collection Time: 02/07/17 10:05 AM  Result Value Ref Range Status   Urine Culture, Routine Final report  Final   Urine Culture result 1 Comment  Final    Comment: Mixed urogenital flora 10,000-25,000 colony forming units per mL   Culture, blood (single)     Status: None (Preliminary result)   Collection Time: 02/09/17  8:30 AM  Result Value Ref Range Status   Specimen Description BLOOD PORT  Final   Special Requests BOTTLES DRAWN AEROBIC AND ANAEROBIC 69m  Final   Culture   Final    NO GROWTH 4 DAYS Performed at [facility] [place] [address]   Report Status PENDING  Incomplete  Culture, blood (Routine X 2) w Reflex to ID Panel     Status: None (Preliminary result)   Collection Time: 02/09/17 10:02 AM  Result Value Ref Range Status   Specimen Description BLOOD LEFT ANTECUBITAL  Final   Special Requests BOTTLES DRAWN AEROBIC AND ANAEROBIC 10CC  Final   Culture   Final    NO GROWTH 4 DAYS Performed at [facility] [address] [place] [address]   Report Status PENDING  Incomplete  Culture, blood (Routine X 2) w Reflex to ID Panel     Status: None (Preliminary result)   Collection Time: 02/09/17 10:07 AM  Result Value Ref Range Status   Specimen Description BLOOD RIGHT ARM  Final   Special Requests BOTTLES DRAWN AEROBIC AND ANAEROBIC 5CC  Final   Culture   Final    NO GROWTH 4 DAYS Performed at [facility] [address], [place] [address]   Report Status PENDING  Incomplete  Culture, Urine     Status: None   Collection Time: 02/09/17  1:00 PM  Result Value Ref Range Status   Specimen  Description URINE, CLEAN CATCH  Final   Special Requests NONE  Final   Culture   Final    NO GROWTH Performed at [facility], [place] [address], [place], [place] [address]    Report Status 02/10/2017 FINAL  Final      Studies:  chest X-ray 03/24 NAD  Assessment: 51 y.o. [place] woman status post left breast upper inner quadrant biopsy 11/21/2016 for a clinical T1 cN0, stage IA invasive ductal carcinoma, grade 3, estrogen and progesterone receptor positive, HER-2 not amplified, with an MIB-1 of 90%, admitted 03/22 with febrile neutropenia day 8 cycle 2 of cyclphosphamide/ doxorubicin  (1) Oncotype DX Score of 60 predicts a risk of recurrence outside the breast of 34% if the patient's only systemic therapy is tamoxifen for 5 years. On a similar database this risk drops to about 12% if chemotherapy is also given  (2) genetics testing sent 11/28/2016; The Breast/GYN gene panel offered by [facility] includes sequencing and rearrangement analysis for the following 23 genes: ATM, BARD1, BRCA1, BRCA2, BRIP1, CDH1, CHEK2, EPCAM, FANCC, MLH1, MSH2, MSH6, MUTYH, NBN, NF1, PALB2, PMS2, POLD1, PTEN, RAD51C, RAD51D, RECQL, and TP53. Genetic testing did not reveal a deleterious mutation, however it detected a Variant of Unknown Significance in the RAD51Dgene called [id number].    (3) status post left breast lumpectomy on 12/31/2016: invasive ductal carcinoma, 1.8cm, grade 3, margins negative, 1 SLN negative, ER+(80%), PR-(2%), Ki-67 90%, HER-2 negative (ratio 1.05).  T1cN0  (4) chemotherapy consisting of cyclophosphamide and doxorubicin in dose dense fashion 4 (started 01/17/17), to be followed by Abraxane weekly 12.   (5) adjuvant radiation to follow as appropriate.  (6) febrile neutropenia; CURRENTLY: day 15 cycle 2 chemo, day 7 neupogen, day 8 antibiotics . Neutropenia resolved All cultures remain negative  Plan:  Ready for d/c.  She was due for chemo today but next cycle will be  postponed to 02/21/2017--she has an appointment with [place] at 10 AM that day I apologized to her for missing her neulasta shot day 3 cycle 2-- will change to onpro with subsequent cycles assuming approval by insurance Only concern is diarrhea. This is likely due to ABX. Would consider 3 additional days (instead of 7) po ABX and start probiotics  Appreciate your care of this patient!    Chauncey Cruel, MD 02/11/2017  7:34 AM Medical Oncology and Hematology [facility] [address] [place], [place] [address] Tel. [phone]    Fax. [phone]   .

## 2017-02-14 NOTE — Progress Notes (Addendum)
PROGRESS NOTE    Abigail Yoder  XJO:[id number] DOB: 20-Nov-1965 DOA: 02/07/2017 PCP: [name], MD   Brief Narrative:  Abigail Yoder is a 51 y.o. female with medical history significant of recently diagnosed breast cancer currently undergoing chemotherapy, diabetes, hypertension, obesity, is being sent for direct admission from the [facility] due to fever. Patient tells me that over the last couple days she has been feeling extreme fatigue and weak with minimal activity. She also complained of chills. Yesterday. She was evaluated in the [facility] today, she was found to be febrile with a temperature of 100.8. Blood work revealed neutropenia, and she was directed for admission for IV antibiotics. She has no chest pain, denies any cough or chest congestion. She has no abdominal pain, no nausea or vomiting. She has had a few episodes of loose stools in the last couple days. She has no sore throat, but he is been complaining of a runny nose for the past 3-4 days. She also is complaining of diffuse gum "achiness" especially with solid food and warm liquids. It appears that she has not received her Neulasta following her last chemotherapy.   Assessment & Plan:   Principal Problem:   Neutropenic fever ([place]) Active Problems:   Type 2 diabetes mellitus (HCC)   Iron deficiency anemia   Essential hypertension   Obesity (BMI 30-39.9)   Malignant neoplasm of upper-inner quadrant of left breast in female, estrogen receptor positive ([place])   Hypomagnesemia   Trichomonas infection   Antineoplastic chemotherapy induced pancytopenia (HCC)  Neutropenic fever -Unknown etiology at this time. Her cultures remain negative. -Currently she is on vancomycin and meropenem. This will be day 7. Augmentin/Cipro for 9 more day. Added probiotic.  -Port site looks okay. -Tylenol if needed for fevers.  Iron deficiency anemia -Hemoglobin currently at 8.4 from 9.0 from 6.7 from 9.2 on 01/17/2017. s/p  2 units  packed red blood cells. Follow H&H.  -Remained stable at this time CBC Latest Ref Rng & Units 02/14/2017 02/13/2017 02/12/2017  WBC 4.0 - 10.5 K/uL 3.9(L) 1.6(L) 0.7(LL)  Hemoglobin 12.0 - 15.0 g/dL 8.4(L) 8.5(L) 8.9(L)  Hematocrit 36.0 - 46.0 % 25.0(L) 24.7(L) 26.4(L)  Platelets 150 - 400 K/uL 158 112(L) 81(L)    Chemotherapy-induced anemia and neutropenia and thrombocytopenia Transfusion threshold hemoglobin less than 7. Patient did not receive Neulasta following her last chemotherapy. Continue on granix until [place] is greater than 1.  Follow up outpatient with Dr Jana Hakim on 02/21/17  Diabetes mellitus type 2 Hemoglobin A1c 7.8. Increased Levemir to 20 units daily . Continue sliding scale insulin.  Hypomagnesemia Resolved  Hypertension Blood pressure remains stable. Discontinued Cozaar and HCTZ secondary to increasing creatinine. Continue Norvasc to 10 mg daily.   Stage IA breast cancer Status post cycle 2 day 9 of treatment. Per hematology/oncology.  Trichomonas in the urine Patient given a dose of Flagyl 2 g by mouth 1.   DVT prophylaxis: SCDs Code Status: Full Family Communication: Patient comprehends well.  Disposition Plan: If remains stable and afebrile. Can likely go home in next 24 hrs.    Consultants:   Hematology/oncology: Dr Jana Hakim  Infectious disease: Dr. Baxter Flattery 02/09/2017  Procedures:   Chest x-ray 02/07/2017  Lower extremity Dopplers 02/10/2017  Transfusion 2 units packed red blood cells 02/10/2017  Antimicrobials:   IV vancomycin 02/07/2017>>>>   IV cefepime 02/07/2017>>02/11/2017  IV Merrem 02/11/2017  Augmentin 3/28   Subjective: Patient remains afebrile. Doesn't have any complaints. Eager to go home.  Objective: Vitals:   02/13/17 1554 02/13/17 2150 02/14/17 0515 02/14/17 0947  BP: 120/62 (!) 101/45 131/67 136/70  Pulse: 91 90 82 80  Resp: 18 16 16    Temp: 98.7 F (37.1 C) 98.7 F (37.1 C) 98.4 F (36.9 C)   TempSrc: Oral Oral  Oral   SpO2: 100% 99% 100%   Weight:   97.6 kg (215 lb 2.7 oz)   Height:        Intake/Output Summary (Last 24 hours) at 02/14/17 1011 Last data filed at 02/14/17 0815  Gross per 24 hour  Intake              480 ml  Output             1100 ml  Net             -620 ml   Filed Weights   02/12/17 0700 02/13/17 0540 02/14/17 0515  Weight: 93.9 kg (207 lb) 97.4 kg (214 lb 11.7 oz) 97.6 kg (215 lb 2.7 oz)    Examination:  General exam: Appears calm and comfortable.  Respiratory system: Clear to auscultation. Respiratory effort normal. Cardiovascular system: S1 & S2 heard, RRR. No JVD, murmurs, rubs, gallops or clicks. No pedal edema. Gastrointestinal system: Abdomen is nondistended, soft and nontender. No organomegaly or masses felt. Normal bowel sounds heard. Central nervous system: Alert and oriented. No focal neurological deficits. Extremities: Symmetric 5 x 5 power. Skin: No rashes, lesions or ulcers Psychiatry: Judgement and insight appear normal. Mood & affect appropriate.     Data Reviewed: I have personally reviewed following labs and imaging studies  CBC:  Recent Labs Lab 02/10/17 0500 02/10/17 1700 02/11/17 0500 02/12/17 0500 02/13/17 0300 02/14/17 0453  WBC 0.2* 0.3* 0.2* 0.7* 1.6* 3.9*  NEUTROABS 0.0*  --  0.1* 0.5* 1.0* 3.1  HGB 6.7* 9.0* 9.0* 8.9* 8.5* 8.4*  HCT 19.3* 25.9* 26.1* 26.4* 24.7* 25.0*  MCV 85.4 83.5 82.6 82.0 84.0 83.9  PLT 65* 58* 61* 81* 112* 902   Basic Metabolic Panel:  Recent Labs Lab 02/08/17 0510 02/09/17 0405 02/10/17 0500 02/11/17 0500 02/12/17 0500 02/13/17 0300 02/14/17 0453  NA 140 136 136 135 139 141 139  K 3.5 3.5 3.6 3.8 3.5 4.2 4.2  CL 108 107 107 106 108 111 109  CO2 25 25 24 23 24 25 24   GLUCOSE 79 162* 156* 253* 126* 85 107*  BUN 19 15 17  22* 22* 21* 17  CREATININE 1.22* 1.20* 1.46* 1.28* 1.17* 1.26* 1.06*  CALCIUM 7.4* 7.7* 8.1* 8.4* 8.7* 8.6* 8.5*  MG 1.0* 2.0  --  1.5* 2.2  --  1.5*  PHOS 3.1  --   --    --   --   --   --    GFR: Estimated Creatinine Clearance: 69.9 mL/min (A) (by C-G formula based on SCr of 1.06 mg/dL (H)). Liver Function Tests:  Recent Labs Lab 02/08/17 0510 02/14/17 0453  AST 14* 18  ALT 13* 16  ALKPHOS 51 62  BILITOT 0.7 0.3  PROT 6.3* 6.3*  ALBUMIN 2.6* 2.4*   No results for input(s): LIPASE, AMYLASE in the last 168 hours. No results for input(s): AMMONIA in the last 168 hours. Coagulation Profile: No results for input(s): INR, PROTIME in the last 168 hours. Cardiac Enzymes: No results for input(s): CKTOTAL, CKMB, CKMBINDEX, TROPONINI in the last 168 hours. BNP (last 3 results) No results for input(s): PROBNP in the last 8760 hours. HbA1C: No results for input(s):  HGBA1C in the last 72 hours. CBG:  Recent Labs Lab 02/13/17 0946 02/13/17 1159 02/13/17 1739 02/13/17 2148 02/14/17 0806  GLUCAP 130* 153* 158* 223* 77   Lipid Profile: No results for input(s): CHOL, HDL, LDLCALC, TRIG, CHOLHDL, LDLDIRECT in the last 72 hours. Thyroid Function Tests: No results for input(s): TSH, T4TOTAL, FREET4, T3FREE, THYROIDAB in the last 72 hours. Anemia Panel: No results for input(s): VITAMINB12, FOLATE, FERRITIN, TIBC, IRON, RETICCTPCT in the last 72 hours. Sepsis Labs: No results for input(s): PROCALCITON, LATICACIDVEN in the last 168 hours.  Recent Results (from the past 240 hour(s))  Culture, Blood     Status: None   Collection Time: 02/07/17  9:28 AM  Result Value Ref Range Status   BLOOD CULTURE, ROUTINE Final report  Final   RESULT 1 Comment  Final    Comment: No aerobic or anaerobic growth in five days.  Culture, Blood     Status: None   Collection Time: 02/07/17 10:05 AM  Result Value Ref Range Status   BLOOD CULTURE, ROUTINE Final report  Final   RESULT 1 Comment  Final    Comment: No aerobic or anaerobic growth in five days.  Urine Culture     Status: None   Collection Time: 02/07/17 10:05 AM  Result Value Ref Range Status   Urine Culture,  Routine Final report  Final   Urine Culture result 1 Comment  Final    Comment: Mixed urogenital flora 10,000-25,000 colony forming units per mL   Culture, blood (single)     Status: None (Preliminary result)   Collection Time: 02/09/17  8:30 AM  Result Value Ref Range Status   Specimen Description BLOOD PORT  Final   Special Requests BOTTLES DRAWN AEROBIC AND ANAEROBIC 41ml  Final   Culture   Final    NO GROWTH 4 DAYS Performed at [facility] [address] [place], [place] [address]    Report Status PENDING  Incomplete  Culture, blood (Routine X 2) w Reflex to ID Panel     Status: None (Preliminary result)   Collection Time: 02/09/17 10:02 AM  Result Value Ref Range Status   Specimen Description BLOOD LEFT ANTECUBITAL  Final   Special Requests BOTTLES DRAWN AEROBIC AND ANAEROBIC 10CC  Final   Culture   Final    NO GROWTH 4 DAYS Performed at [facility], [place] [address], [place], [place] [address]    Report Status PENDING  Incomplete  Culture, blood (Routine X 2) w Reflex to ID Panel     Status: None (Preliminary result)   Collection Time: 02/09/17 10:07 AM  Result Value Ref Range Status   Specimen Description BLOOD RIGHT ARM  Final   Special Requests BOTTLES DRAWN AEROBIC AND ANAEROBIC 5CC  Final   Culture   Final    NO GROWTH 4 DAYS Performed at [facility], [place] [address], [place], [place] [address]    Report Status PENDING  Incomplete  Culture, Urine     Status: None   Collection Time: 02/09/17  1:00 PM  Result Value Ref Range Status   Specimen Description URINE, CLEAN CATCH  Final   Special Requests NONE  Final   Culture   Final    NO GROWTH Performed at [facility], [place] [address] [place], [place] [address]    Report Status 02/10/2017 FINAL  Final         Radiology Studies: No results found.  Scheduled Meds: . amLODipine  10 mg Oral Daily  . amoxicillin-clavulanate  1 tablet Oral Q12H  . ciprofloxacin  500 mg Oral  BID  . feeding supplement (GLUCERNA SHAKE)  237 mL Oral TID BM  . insulin aspart  0-15 Units Subcutaneous TID WC  . insulin aspart  0-5 Units Subcutaneous QHS  . insulin detemir  20 Units Subcutaneous QHS  . lidocaine-prilocaine   Topical Once  . multivitamin with minerals  1 tablet Oral Daily  . Tbo-filgastrim (GRANIX) SQ  480 mcg Subcutaneous q1800   Continuous Infusions:   LOS: 7 days    Time spent: 40 minutes    Ankit Arsenio Loader, MD [facility] Pager [phone]  If 7PM-7AM, please contact night-coverage [email or web address] Password [facility] 02/14/2017, 10:11 AM

## 2017-02-14 NOTE — Progress Notes (Signed)
Discharge home. Stable.

## 2017-02-21 ENCOUNTER — Other Ambulatory Visit: Payer: Self-pay | Admitting: Oncology

## 2017-02-21 ENCOUNTER — Ambulatory Visit: Payer: BLUE CROSS/BLUE SHIELD

## 2017-02-21 ENCOUNTER — Encounter: Payer: Self-pay | Admitting: Adult Health

## 2017-02-21 ENCOUNTER — Ambulatory Visit (HOSPITAL_BASED_OUTPATIENT_CLINIC_OR_DEPARTMENT_OTHER): Payer: BLUE CROSS/BLUE SHIELD

## 2017-02-21 ENCOUNTER — Encounter: Payer: Self-pay | Admitting: *Deleted

## 2017-02-21 ENCOUNTER — Ambulatory Visit (HOSPITAL_BASED_OUTPATIENT_CLINIC_OR_DEPARTMENT_OTHER): Payer: BLUE CROSS/BLUE SHIELD | Admitting: Adult Health

## 2017-02-21 ENCOUNTER — Other Ambulatory Visit (HOSPITAL_BASED_OUTPATIENT_CLINIC_OR_DEPARTMENT_OTHER): Payer: BLUE CROSS/BLUE SHIELD

## 2017-02-21 VITALS — BP 153/75 | HR 95 | Temp 98.3°F | Resp 20 | Wt 209.3 lb

## 2017-02-21 DIAGNOSIS — Z170 Estrogen receptor positive status [ER+]: Principal | ICD-10-CM

## 2017-02-21 DIAGNOSIS — Z5111 Encounter for antineoplastic chemotherapy: Secondary | ICD-10-CM | POA: Diagnosis not present

## 2017-02-21 DIAGNOSIS — C50212 Malignant neoplasm of upper-inner quadrant of left female breast: Secondary | ICD-10-CM | POA: Diagnosis not present

## 2017-02-21 DIAGNOSIS — Z803 Family history of malignant neoplasm of breast: Secondary | ICD-10-CM

## 2017-02-21 LAB — CBC WITH DIFFERENTIAL/PLATELET
BASO%: 0.5 % (ref 0.0–2.0)
BASOS ABS: 0 10*3/uL (ref 0.0–0.1)
EOS ABS: 0 10*3/uL (ref 0.0–0.5)
EOS%: 0.3 % (ref 0.0–7.0)
HEMATOCRIT: 27.2 % — AB (ref 34.8–46.6)
HGB: 9 g/dL — ABNORMAL LOW (ref 11.6–15.9)
LYMPH#: 1.1 10*3/uL (ref 0.9–3.3)
LYMPH%: 16.3 % (ref 14.0–49.7)
MCH: 28.5 pg (ref 25.1–34.0)
MCHC: 33.1 g/dL (ref 31.5–36.0)
MCV: 86.1 fL (ref 79.5–101.0)
MONO#: 0.5 10*3/uL (ref 0.1–0.9)
MONO%: 8.3 % (ref 0.0–14.0)
NEUT#: 4.8 10*3/uL (ref 1.5–6.5)
NEUT%: 74.6 % (ref 38.4–76.8)
PLATELETS: 267 10*3/uL (ref 145–400)
RBC: 3.16 10*6/uL — AB (ref 3.70–5.45)
RDW: 14.8 % — ABNORMAL HIGH (ref 11.2–14.5)
WBC: 6.5 10*3/uL (ref 3.9–10.3)

## 2017-02-21 LAB — COMPREHENSIVE METABOLIC PANEL
ALT: 14 U/L (ref 0–55)
ANION GAP: 10 meq/L (ref 3–11)
AST: 19 U/L (ref 5–34)
Albumin: 2.9 g/dL — ABNORMAL LOW (ref 3.5–5.0)
Alkaline Phosphatase: 81 U/L (ref 40–150)
BUN: 13.4 mg/dL (ref 7.0–26.0)
CALCIUM: 9.2 mg/dL (ref 8.4–10.4)
CHLORIDE: 109 meq/L (ref 98–109)
CO2: 23 meq/L (ref 22–29)
CREATININE: 1.2 mg/dL — AB (ref 0.6–1.1)
EGFR: 59 mL/min/{1.73_m2} — AB (ref >90)
Glucose: 113 mg/dl (ref 70–140)
Potassium: 4 mEq/L (ref 3.5–5.1)
Sodium: 142 mEq/L (ref 136–145)
Total Bilirubin: 0.25 mg/dL (ref 0.20–1.20)
Total Protein: 7.1 g/dL (ref 6.4–8.3)

## 2017-02-21 MED ORDER — SODIUM CHLORIDE 0.9 % IV SOLN
Freq: Once | INTRAVENOUS | Status: AC
  Administered 2017-02-21: 11:00:00 via INTRAVENOUS

## 2017-02-21 MED ORDER — SODIUM CHLORIDE 0.9% FLUSH
10.0000 mL | INTRAVENOUS | Status: DC | PRN
  Administered 2017-02-21: 10 mL
  Filled 2017-02-21: qty 10

## 2017-02-21 MED ORDER — SODIUM CHLORIDE 0.9% FLUSH
10.0000 mL | INTRAVENOUS | Status: DC | PRN
  Administered 2017-02-21: 10 mL via INTRAVENOUS
  Filled 2017-02-21: qty 10

## 2017-02-21 MED ORDER — HEPARIN SOD (PORK) LOCK FLUSH 100 UNIT/ML IV SOLN
500.0000 [IU] | Freq: Once | INTRAVENOUS | Status: AC | PRN
  Administered 2017-02-21: 500 [IU]
  Filled 2017-02-21: qty 5

## 2017-02-21 MED ORDER — CYCLOPHOSPHAMIDE CHEMO INJECTION 1 GM
600.0000 mg/m2 | Freq: Once | INTRAMUSCULAR | Status: AC
  Administered 2017-02-21: 1280 mg via INTRAVENOUS
  Filled 2017-02-21: qty 64

## 2017-02-21 MED ORDER — DOXORUBICIN HCL CHEMO IV INJECTION 2 MG/ML
60.0000 mg/m2 | Freq: Once | INTRAVENOUS | Status: AC
  Administered 2017-02-21: 128 mg via INTRAVENOUS
  Filled 2017-02-21: qty 64

## 2017-02-21 MED ORDER — PALONOSETRON HCL INJECTION 0.25 MG/5ML
INTRAVENOUS | Status: AC
  Filled 2017-02-21: qty 5

## 2017-02-21 MED ORDER — SODIUM CHLORIDE 0.9 % IV SOLN
Freq: Once | INTRAVENOUS | Status: AC
  Administered 2017-02-21: 12:00:00 via INTRAVENOUS
  Filled 2017-02-21: qty 5

## 2017-02-21 MED ORDER — PALONOSETRON HCL INJECTION 0.25 MG/5ML
0.2500 mg | Freq: Once | INTRAVENOUS | Status: AC
  Administered 2017-02-21: 0.25 mg via INTRAVENOUS

## 2017-02-21 NOTE — Patient Instructions (Signed)
[facility] Discharge Instructions for Patients Receiving Chemotherapy  Today you received the following chemotherapy agents:  Doxyrubin (adriamycin), Cytoxan (cyclophosphamide)  To help prevent nausea and vomiting after your treatment, we encourage you to take your nausea medication as prescribed.   If you develop nausea and vomiting that is not controlled by your nausea medication, call the clinic.   BELOW ARE SYMPTOMS THAT SHOULD BE REPORTED IMMEDIATELY:  *FEVER GREATER THAN 100.5 F  *CHILLS WITH OR WITHOUT FEVER  NAUSEA AND VOMITING THAT IS NOT CONTROLLED WITH YOUR NAUSEA MEDICATION  *UNUSUAL SHORTNESS OF BREATH  *UNUSUAL BRUISING OR BLEEDING  TENDERNESS IN MOUTH AND THROAT WITH OR WITHOUT PRESENCE OF ULCERS  *URINARY PROBLEMS  *BOWEL PROBLEMS  UNUSUAL RASH Items with * indicate a potential emergency and should be followed up as soon as possible.  Feel free to call the clinic you have any questions or concerns. The clinic phone number is [phone].  Please show the [facility] at check-in to the Emergency Department and triage nurse.

## 2017-02-21 NOTE — Progress Notes (Signed)
Abigail Yoder  [phone] [phone]     ID: [name] DOB: [birth year]  MR#: [id number]  FTD#:[id number]  Patient Care Team: [name], MD as PCP - General (Internal Medicine) Alphonsa Overall, MD as Consulting Physician (General Surgery) Chauncey Cruel, MD as Consulting Physician (Oncology) Eppie Gibson, MD as Attending Physician (Radiation Oncology) Jacelyn Pi, MD as Consulting Physician (Endocrinology) Ardis Hughs, MD as Attending Physician (Urology) Scot Dock, NP OTHER MD:  CHIEF COMPLAINT: Estrogen receptor positive breast cancer  CURRENT TREATMENT: adjuvant chemotherapy   BREAST CANCER HISTORY: From the original intake note:  Angy had bilateral routine screening mammography with tomography at [facility] 11/14/2016. The breast density was category B. In the left breast upper inner quadrant there was a 1.5 cm high density mass which was further evaluated with ultrasonography 11/16/2016. This confirmed a 1.2 cm lobulated mass in the left breast upper inner quadrant. The left axilla was benign.  Biopsy of the left breast mass in question 11/21/2016 showed (SAA [id number]) invasive ductal carcinoma, grade 3, estrogen and progesterone receptor positive, HER-2 nonamplified, with a signals ratio of 1.05 and the number per cell 2.15, with an MIB-1 of 90%.  Her subsequent history is as detailed below  INTERVAL HISTORY: Abigail Yoder is here today on cycle 3 day 1 of Doxorubicin and Cyclophosphamide.  She is doing well today.  She was hospitalized after receiving her second cycle of chemotherapy and developed neutropenic fever.  No etiology of the fevers could be identified.  Her WBC has since recovered.  She has been afebrile and is finishing up with antibiotics including Augmentin and cipro.  She is fatigued.  She did put in for [facility] and short term disability starting April 2.  She is fatigued from her cancer diagnosis and treatment.  She works as a  Patent examiner for [employer].  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  She is unable to meet her job requirements due to fatigue even with accommodations.    REVIEW OF SYSTEMS: She denies any fevers, chills, nausea/vomiting, peripheral neuropathy, constipation, diarrhea, or any other concerns.  A detailed ros is non contributory.  PAST MEDICAL HISTORY: Past Medical History:  Diagnosis Date  . Anemia   . Diabetes mellitus   . Family history of breast cancer   . Hypertension   . Obesity     PAST SURGICAL HISTORY: Past Surgical History:  Procedure Laterality Date  . APPENDECTOMY    . BREAST LUMPECTOMY WITH RADIOACTIVE SEED AND SENTINEL LYMPH NODE BIOPSY Left 12/31/2016   Procedure: BREAST LUMPECTOMY WITH RADIOACTIVE SEED AND SENTINEL LYMPH NODE BIOPSY;  Surgeon: Alphonsa Overall, MD;  Location: [facility];  Service: General;  Laterality: Left;  . CERVICAL SPINE SURGERY    . CESAREAN SECTION    . KNEE SURGERY Left   . PORTACATH PLACEMENT N/A 12/31/2016   Procedure: INSERTION PORT-A-CATH WITH [place];  Surgeon: Alphonsa Overall, MD;  Location: [facility];  Service: General;  Laterality: N/A;  . TUBAL LIGATION      FAMILY HISTORY Family History  Problem Relation Age of Onset  . Diabetes Mother   . Arthritis Mother   . Hypertension Father   . Heart disease Father   . Hyperlipidemia Father   . Breast cancer  Sister 97    came back 17 years later  . Stroke Maternal Uncle   . Stroke Maternal Grandmother   The patient's father died at the age of 61 from a brain aneurysm. The patient's mother died at age 55 following total hip replacement. The patient had 4 brothers, 5 sisters. One sister was diagnosed with breast cancer at age 42. There is no history of ovarian  cancer in the family.  GYNECOLOGIC HISTORY:  No LMP recorded. Patient is not currently having periods (Reason: Perimenopausal). Menarche age 35, first live birth age 8, the patient is [place] P2. She is perimenopausal and her most recent period was October 2017. She never used oral contraceptives.  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 Her husband Abigail Yoder works in a Proofreader. Daughter Abigail Yoder works for the [employer] in [place]. Son show more right works for the Glass blower/designer at [employer] in [place]. The patient has 2 grandchildren. She attends a local [place]: Not in place   HEALTH MAINTENANCE: Social History  Substance Use Topics  . Smoking status: Former Smoker    Quit date: 11/19/1994  . Smokeless tobacco: Never Used  . Alcohol use No     Colonoscopy:Never  PAP:2015  Bone density:Never   Allergies  Allergen Reactions  . Pineapple Anaphylaxis and Hives    Current Outpatient Prescriptions  Medication Sig Dispense Refill  . amLODipine (NORVASC) 5 MG tablet Take 5 mg by mouth daily.    Marland Kitchen amoxicillin-clavulanate (AUGMENTIN) 875-125 MG tablet Take 1 tablet by mouth every 12 (twelve) hours. 18 tablet 0  . ciprofloxacin (CIPRO) 500 MG tablet Take 1 tablet (500 mg total) by mouth 2 (two) times daily. 18 tablet 0  . insulin aspart protamine- aspart (NOVOLOG MIX 70/30) (70-30) 100 UNIT/ML injection Inject 20 Units into the skin 2 (two) times daily with a meal.    . lidocaine-prilocaine (EMLA) cream Apply to affected area once 30 g 3  . LORazepam (ATIVAN) 0.5 MG tablet Take 1 tablet (0.5 mg total) by mouth every 6 (six) hours as needed (Nausea or vomiting). 30 tablet 0  . metFORMIN (GLUCOPHAGE) 1000 MG tablet Take 1,000 mg by mouth 2 (two) times daily with a meal.    . prochlorperazine (COMPAZINE) 10 MG tablet Take 1 tablet (10 mg  total) by mouth every 6 (six) hours as needed (Nausea or vomiting). 30 tablet 1  . saccharomyces boulardii (FLORASTOR) 250 MG capsule Take 1 capsule (250 mg total) by mouth 2 (two) times daily. 60 capsule 0   No current facility-administered medications for this visit.     OBJECTIVE: Middle-aged African-American woman in no acute distress Vitals:   02/21/17 1017  BP: (!) 153/75  Pulse: 95  Resp: 20  Temp: 98.3 F (36.8 C)     Body mass index is 37.08 kg/m.    ECOG FS:1 - Symptomatic but completely ambulatory  GENERAL: Patient is a well appearing female in no acute distress HEENT:  Sclerae anicteric. PERRL  No evidence of oropharyngeal candidiasis. Neck is supple.  NODES:  No cervical, supraclavicular, infraclavicular, or axillary lymphadenopathy palpated.  BREAST EXAM:  Deferred. LUNGS:  Clear to auscultation bilaterally.  No wheezes or rhonchi. HEART:  Regular rate and rhythm. No murmur appreciated. ABDOMEN:  Soft, nontender.  Positive, normoactive bowel sounds. No organomegaly palpated. MSK:  No focal spinal tenderness to palpation.  EXTREMITIES:  No peripheral edema.   SKIN:  Clear with no obvious rashes or skin changes. No nail dyscrasia. NEURO:  Nonfocal. Well oriented.  Appropriate affect.   LAB RESULTS:  CMP     Component Value Date/Time   NA 139 02/14/2017 0453   NA 140 02/07/2017 0808   K 4.2 02/14/2017 0453   K 3.9 02/07/2017 0808   CL 109 02/14/2017 0453   CO2 24 02/14/2017 0453   CO2 24 02/07/2017 0808   GLUCOSE 107 (H) 02/14/2017 0453   GLUCOSE 162 (H) 02/07/2017 0808   GLUCOSE 333 (H) 09/05/2006 1306   BUN 17 02/14/2017 0453   BUN 23.0 02/07/2017 0808   CREATININE 1.06 (H) 02/14/2017 0453   CREATININE 1.3 (H) 02/07/2017 0808   CALCIUM 8.5 (L) 02/14/2017 0453   CALCIUM 8.1 (L) 02/07/2017 0808   PROT 6.3 (L) 02/14/2017 0453   PROT 6.6 02/07/2017 0808   ALBUMIN 2.4 (L) 02/14/2017 0453   ALBUMIN 2.8 (L) 02/07/2017 0808   AST 18 02/14/2017 0453   AST  13 02/07/2017 0808   ALT 16 02/14/2017 0453   ALT 14 02/07/2017 0808   ALKPHOS 62 02/14/2017 0453   ALKPHOS 71 02/07/2017 0808   BILITOT 0.3 02/14/2017 0453   BILITOT 0.49 02/07/2017 0808   GFRNONAA 60 (L) 02/14/2017 0453   GFRAA >60 02/14/2017 0453    INo results found for: SPEP, UPEP  Lab Results  Component Value Date   WBC 6.5 02/21/2017   NEUTROABS 4.8 02/21/2017   HGB 9.0 (L) 02/21/2017   HCT 27.2 (L) 02/21/2017   MCV 86.1 02/21/2017   PLT 267 02/21/2017      Chemistry      Component Value Date/Time   NA 139 02/14/2017 0453   NA 140 02/07/2017 0808   K 4.2 02/14/2017 0453   K 3.9 02/07/2017 0808   CL 109 02/14/2017 0453   CO2 24 02/14/2017 0453   CO2 24 02/07/2017 0808   BUN 17 02/14/2017 0453   BUN 23.0 02/07/2017 0808   CREATININE 1.06 (H) 02/14/2017 0453   CREATININE 1.3 (H) 02/07/2017 0808      Component Value Date/Time   CALCIUM 8.5 (L) 02/14/2017 0453   CALCIUM 8.1 (L) 02/07/2017 0808   ALKPHOS 62 02/14/2017 0453   ALKPHOS 71 02/07/2017 0808   AST 18 02/14/2017 0453   AST 13 02/07/2017 0808   ALT 16 02/14/2017 0453   ALT 14 02/07/2017 0808   BILITOT 0.3 02/14/2017 0453   BILITOT 0.49 02/07/2017 0808       No results found for: LABCA2  No components found for: OACZY606  No results for input(s): INR in the last 168 hours.  Urinalysis    Component Value Date/Time   COLORURINE YELLOW 02/09/2017 1300   APPEARANCEUR HAZY (A) 02/09/2017 1300   LABSPEC 1.013 02/09/2017 1300   LABSPEC 1.010 02/07/2017 1005   PHURINE 5.0 02/09/2017 1300   GLUCOSEU NEGATIVE 02/09/2017 1300   GLUCOSEU Negative 02/07/2017 1005   HGBUR NEGATIVE 02/09/2017 1300   BILIRUBINUR NEGATIVE 02/09/2017 1300   BILIRUBINUR Negative 02/07/2017 1005   KETONESUR NEGATIVE 02/09/2017 1300   PROTEINUR 100 (A) 02/09/2017 1300   UROBILINOGEN 0.2 02/07/2017 1005   NITRITE NEGATIVE 02/09/2017 1300   LEUKOCYTESUR NEGATIVE 02/09/2017 1300   LEUKOCYTESUR Negative 02/07/2017 1005      STUDIES: Dg Chest 2 View  Result Date: 02/09/2017 CLINICAL DATA:  Patient with fever.  History of breast cancer. EXAM: CHEST  2 VIEW COMPARISON:  Chest radiograph 02/07/2017  FINDINGS: Right anterior chest wall Port-A-Cath is present tip projecting over the superior vena cava. Anterior cervical spinal fusion hardware. Normal cardiac and mediastinal contours. No consolidative pulmonary opacities. No pleural effusion or pneumothorax. Regional skeleton is unremarkable. IMPRESSION: No active cardiopulmonary disease. Electronically Signed   By: Lovey Newcomer M.D.   On: 02/09/2017 10:19   Dg Chest 2 View  Result Date: 02/07/2017 CLINICAL DATA:  Neutropenic fever EXAM: CHEST  2 VIEW COMPARISON:  12/31/2016 FINDINGS: Lungs are clear without infiltrate effusion or mass. Negative for pneumonia. Heart size normal. Port-A-Cath tip in the lower SVC unchanged in position. IMPRESSION: No active cardiopulmonary disease. Electronically Signed   By: Franchot Gallo M.D.   On: 02/07/2017 14:16    ELIGIBLE FOR AVAILABLE RESEARCH PROTOCOL: No   ASSESSMENT: 51 y.o. [place] woman status post left breast upper inner quadrant biopsy 11/21/2016 for a clinical T1 cN0, stage IA invasive ductal carcinoma, grade 3, estrogen and progesterone receptor positive, HER-2 not amplified, with an MIB-1 of 90%  (1) Oncotype DX Score of 60 predicts a risk of recurrence outside the breast of 34% if the patient's only systemic therapy is tamoxifen for 5 years. On a similar database this risk drops to about 12% if chemotherapy is also given  (2) genetics testing sent 11/28/2016; The Breast/GYN gene panel offered by [facility] includes sequencing and rearrangement analysis for the following 23 genes:  ATM, BARD1, BRCA1, BRCA2, BRIP1, CDH1, CHEK2, EPCAM, FANCC, MLH1, MSH2, MSH6, MUTYH, NBN, NF1, PALB2, PMS2, POLD1, PTEN, RAD51C, RAD51D, RECQL, and TP53.   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    (3) status post left breast lumpectomy on 12/31/2016: invasive ductal carcinoma, 1.8cm, grade 3, margins negative, 1 SLN negative, ER+(80%), PR-(2%), Ki-67 90%, HER-2 negative (ratio 1.05).  T1cN0  (4) chemotherapy to consist of cyclophosphamide and doxorubicin in dose dense fashion 4 (started 01/17/17), followed by Abraxane weekly 12.   (5) adjuvant radiation to follow as appropriate.  PLAN: [place] is doing well.  Her CBC is normal, she will proceed with chemotherapy, CMET pending.  I will complete paperwork as necessary for her FMLA starting on 4/2.  She is unable to work even with accommodations, due to fatigue from chemotherapy and possible complications.  She was also recently hospitalized a week ago.  She will return tomorrow for Neulasta and in one week for labs and evaluation.     A total of (30) minutes of face-to-face time was spent with this patient with greater than 50% of that time in counseling and care-coordination.    Scot Dock, NP   02/21/2017 10:28 AM Medical Oncology and Hematology [facility] [address] [place],  [address] Tel. [phone]    Fax. [phone]

## 2017-02-22 ENCOUNTER — Ambulatory Visit (HOSPITAL_BASED_OUTPATIENT_CLINIC_OR_DEPARTMENT_OTHER): Payer: BLUE CROSS/BLUE SHIELD

## 2017-02-22 VITALS — BP 146/66 | HR 90 | Temp 98.5°F

## 2017-02-22 DIAGNOSIS — Z5189 Encounter for other specified aftercare: Secondary | ICD-10-CM | POA: Diagnosis not present

## 2017-02-22 DIAGNOSIS — C50212 Malignant neoplasm of upper-inner quadrant of left female breast: Secondary | ICD-10-CM

## 2017-02-22 DIAGNOSIS — Z170 Estrogen receptor positive status [ER+]: Secondary | ICD-10-CM

## 2017-02-22 DIAGNOSIS — Z803 Family history of malignant neoplasm of breast: Secondary | ICD-10-CM

## 2017-02-22 MED ORDER — PEGFILGRASTIM INJECTION 6 MG/0.6ML ~~LOC~~
6.0000 mg | PREFILLED_SYRINGE | Freq: Once | SUBCUTANEOUS | Status: AC
  Administered 2017-02-22: 6 mg via SUBCUTANEOUS
  Filled 2017-02-22: qty 0.6

## 2017-02-22 MED ORDER — FILGRASTIM 480 MCG/0.8ML IJ SOSY: 480.0000 ug | PREFILLED_SYRINGE | Freq: Once | INTRAMUSCULAR | Status: DC

## 2017-02-25 ENCOUNTER — Encounter: Payer: Self-pay | Admitting: *Deleted

## 2017-02-28 ENCOUNTER — Ambulatory Visit (HOSPITAL_BASED_OUTPATIENT_CLINIC_OR_DEPARTMENT_OTHER): Payer: BLUE CROSS/BLUE SHIELD

## 2017-02-28 ENCOUNTER — Other Ambulatory Visit (HOSPITAL_BASED_OUTPATIENT_CLINIC_OR_DEPARTMENT_OTHER): Payer: BLUE CROSS/BLUE SHIELD

## 2017-02-28 ENCOUNTER — Ambulatory Visit (HOSPITAL_BASED_OUTPATIENT_CLINIC_OR_DEPARTMENT_OTHER): Payer: BLUE CROSS/BLUE SHIELD | Admitting: Adult Health

## 2017-02-28 ENCOUNTER — Telehealth: Payer: Self-pay

## 2017-02-28 ENCOUNTER — Encounter: Payer: Self-pay | Admitting: Adult Health

## 2017-02-28 VITALS — BP 154/77 | HR 97 | Temp 98.7°F | Resp 18

## 2017-02-28 VITALS — BP 132/69 | HR 94 | Temp 98.5°F | Resp 18 | Wt 207.0 lb

## 2017-02-28 DIAGNOSIS — D649 Anemia, unspecified: Secondary | ICD-10-CM

## 2017-02-28 DIAGNOSIS — R5383 Other fatigue: Secondary | ICD-10-CM

## 2017-02-28 DIAGNOSIS — C50212 Malignant neoplasm of upper-inner quadrant of left female breast: Secondary | ICD-10-CM | POA: Diagnosis not present

## 2017-02-28 DIAGNOSIS — D701 Agranulocytosis secondary to cancer chemotherapy: Secondary | ICD-10-CM | POA: Diagnosis not present

## 2017-02-28 DIAGNOSIS — Z170 Estrogen receptor positive status [ER+]: Principal | ICD-10-CM

## 2017-02-28 LAB — CBC WITH DIFFERENTIAL/PLATELET
BASO%: 0 % (ref 0.0–2.0)
Basophils Absolute: 0 10*3/uL (ref 0.0–0.1)
EOS%: 0 % (ref 0.0–7.0)
Eosinophils Absolute: 0 10*3/uL (ref 0.0–0.5)
HEMATOCRIT: 24.4 % — AB (ref 34.8–46.6)
HEMOGLOBIN: 8 g/dL — AB (ref 11.6–15.9)
LYMPH#: 0.2 10*3/uL — AB (ref 0.9–3.3)
LYMPH%: 72.7 % — ABNORMAL HIGH (ref 14.0–49.7)
MCH: 28.3 pg (ref 25.1–34.0)
MCHC: 32.8 g/dL (ref 31.5–36.0)
MCV: 86.2 fL (ref 79.5–101.0)
MONO#: 0 10*3/uL — AB (ref 0.1–0.9)
MONO%: 9.1 % (ref 0.0–14.0)
NEUT%: 18.2 % — AB (ref 38.4–76.8)
NEUTROS ABS: 0 10*3/uL — AB (ref 1.5–6.5)
Platelets: 167 10*3/uL (ref 145–400)
RBC: 2.83 10*6/uL — ABNORMAL LOW (ref 3.70–5.45)
RDW: 15.2 % — ABNORMAL HIGH (ref 11.2–14.5)
WBC: 0.2 10*3/uL — AB (ref 3.9–10.3)
nRBC: 0 % (ref 0–0)

## 2017-02-28 LAB — COMPREHENSIVE METABOLIC PANEL
ALBUMIN: 2.9 g/dL — AB (ref 3.5–5.0)
ALK PHOS: 75 U/L (ref 40–150)
ALT: 14 U/L (ref 0–55)
ANION GAP: 10 meq/L (ref 3–11)
AST: 12 U/L (ref 5–34)
BUN: 49 mg/dL — ABNORMAL HIGH (ref 7.0–26.0)
CALCIUM: 8.9 mg/dL (ref 8.4–10.4)
CO2: 23 mEq/L (ref 22–29)
Chloride: 106 mEq/L (ref 98–109)
Creatinine: 2.2 mg/dL — ABNORMAL HIGH (ref 0.6–1.1)
EGFR: 29 mL/min/{1.73_m2} — ABNORMAL LOW (ref >90)
Glucose: 231 mg/dl — ABNORMAL HIGH (ref 70–140)
Sodium: 138 mEq/L (ref 136–145)
Total Bilirubin: 0.42 mg/dL (ref 0.20–1.20)
Total Protein: 6.5 g/dL (ref 6.4–8.3)

## 2017-02-28 MED ORDER — SODIUM CHLORIDE 0.9% FLUSH
10.0000 mL | INTRAVENOUS | Status: DC | PRN
  Administered 2017-02-28: 10 mL via INTRAVENOUS
  Filled 2017-02-28: qty 10

## 2017-02-28 MED ORDER — HEPARIN SOD (PORK) LOCK FLUSH 100 UNIT/ML IV SOLN
500.0000 [IU] | Freq: Once | INTRAVENOUS | Status: AC
  Administered 2017-02-28: 500 [IU] via INTRAVENOUS
  Filled 2017-02-28: qty 5

## 2017-02-28 MED ORDER — SODIUM CHLORIDE 0.9 % IV SOLN
Freq: Once | INTRAVENOUS | Status: AC
  Administered 2017-02-28: 16:00:00 via INTRAVENOUS

## 2017-02-28 MED ORDER — CIPROFLOXACIN HCL 500 MG PO TABS: 500.0000 mg | ORAL_TABLET | Freq: Two times a day (BID) | ORAL | 0 refills | Status: DC

## 2017-02-28 NOTE — Telephone Encounter (Signed)
-----   Message from Gardenia Phlegm, NP sent at 02/28/2017 11:11 AM EDT ----- Kidney function elevated.  She needs fluids today, tomorrow, and Monday (repeat BMET on Monday before IV fluids STAT).  Is she having any diarrhea or decreased fluid intake?

## 2017-02-28 NOTE — Telephone Encounter (Signed)
Contacted pt to let her know that her kidney function was a little high and therefore, will need some IVF hydration today, per Wilber Bihari, NP. Pt states that she is available to come back this afternoon. Scheduled pt for iv fluids at 3pm today. Notified pt that she will need to come back tomorrow (ivf's) and Monday for (labs/ivf). Pt verbalized understanding. Told pt that a new schedule will be given today after her infusion to reflect ivf appointments. Pt denies any diarrhea at this time. Pt also states that she has been hydrating well. Sent high priority msg to scheduling to add pt today in infusion. Spoke with charge nurse and okay to add at 3pm.

## 2017-02-28 NOTE — Progress Notes (Signed)
Gave pt new updated calendar of appointments, while waiting at the lobby. Pt wanted to know if she will have diarrhea with her antibiotic that Mendel Ryder C,NP prescribed her. Pt was recently on antibiotics a few weeks ago, which caused her to have diarrhea. Pt diarrhea resolved at this time. Pt also remembered to report that her pcp took her off of hctz d/t increased kidney function and put her on a different bp medication (unable to remember med). Pt also taking probiotics 2x daily to help with diarrhea. Advised pt to keep hydrating and to check her temperature twice a day as well. Pt given instructions on neutropenic precautions due to Richards of 0. Pt coming back tomorrow and Monday for more ivf's. Pt verbalized understanding.

## 2017-02-28 NOTE — Progress Notes (Signed)
[facility]  [phone] [phone]     ID: Abigail Yoder DOB: [birth year]  MR#: [id number]  WVP#:[id number]  Patient Care Team: [name], MD as PCP - General (Internal Medicine) Alphonsa Overall, MD as Consulting Physician (General Surgery) Chauncey Cruel, MD as Consulting Physician (Oncology) Eppie Gibson, MD as Attending Physician (Radiation Oncology) Jacelyn Pi, MD as Consulting Physician (Endocrinology) Ardis Hughs, MD as Attending Physician (Urology) Scot Dock, NP OTHER MD:  CHIEF COMPLAINT: Estrogen receptor positive breast cancer  CURRENT TREATMENT: adjuvant chemotherapy   BREAST CANCER HISTORY: From the original intake note:  Abigail Yoder had bilateral routine screening mammography with tomography at [facility] 11/14/2016. The breast density was category B. In the left breast upper inner quadrant there was a 1.5 cm high density mass which was further evaluated with ultrasonography 11/16/2016. This confirmed a 1.2 cm lobulated mass in the left breast upper inner quadrant. The left axilla was benign.  Biopsy of the left breast mass in question 11/21/2016 showed (SAA [id number]) invasive ductal carcinoma, grade 3, estrogen and progesterone receptor positive, HER-2 nonamplified, with a signals ratio of 1.05 and the number per cell 2.15, with an MIB-1 of 90%.  Her subsequent history is as detailed below  INTERVAL HISTORY: Abigail Yoder is here today on cycle 3 day 8 of Doxorubicin and Cyclophosphamide.  She is doing well today.  She is neutropenic and anemic today.  She is mildly fatigued, however is not symptomatic.  She is doing well and denies any difficulties such as neuropathy, fevers, chills, nausea, vomiting, mucositis, or any other concerns.  Her blood sugars have been controlled when she has been checking them recently.    REVIEW OF SYSTEMS: A detailed ROS was conducted and is non contributory.    PAST MEDICAL HISTORY: Past Medical History:   Diagnosis Date  . Anemia   . Diabetes mellitus   . Family history of breast cancer   . Hypertension   . Obesity     PAST SURGICAL HISTORY: Past Surgical History:  Procedure Laterality Date  . APPENDECTOMY    . BREAST LUMPECTOMY WITH RADIOACTIVE SEED AND SENTINEL LYMPH NODE BIOPSY Left 12/31/2016   Procedure: BREAST LUMPECTOMY WITH RADIOACTIVE SEED AND SENTINEL LYMPH NODE BIOPSY;  Surgeon: Alphonsa Overall, MD;  Location: [facility];  Service: General;  Laterality: Left;  . CERVICAL SPINE SURGERY    . CESAREAN SECTION    . KNEE SURGERY Left   . PORTACATH PLACEMENT N/A 12/31/2016   Procedure: INSERTION PORT-A-CATH WITH [place];  Surgeon: Alphonsa Overall, MD;  Location: [facility];  Service: General;  Laterality: N/A;  . TUBAL LIGATION      FAMILY HISTORY Family History  Problem Relation Age of Onset  . Diabetes Mother   . Arthritis Mother   . Hypertension Father   . Heart disease Father   . Hyperlipidemia Father   . Breast cancer Sister 60    came back 17 years later  . Stroke Maternal Uncle   . Stroke Maternal Grandmother   The patient's father died at the age of 16 from a brain aneurysm. The patient's mother died at age 33 following total hip replacement. The patient had 4 brothers, 5 sisters. One sister was diagnosed with breast cancer at age 69. There is no history of ovarian cancer in the family.  GYNECOLOGIC HISTORY:  No LMP recorded. Patient is not currently having periods (Reason: Perimenopausal). Menarche age 25, first live birth age 59, the patient is  Abigail Yoder. She is perimenopausal and her most recent period was October 2017. She never used oral contraceptives.  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 Her husband [name] works in a Proofreader. Daughter [name] works for the [employer] in [place]. Son show more right works for the Glass blower/designer at [employer] in [place]. The patient has 2 grandchildren. She attends a local [place]: Not in place   HEALTH MAINTENANCE: Social History  Substance Use Topics  . Smoking status: Former Smoker    Quit date: 11/19/1994  . Smokeless tobacco: Never Used  . Alcohol use No     Colonoscopy:Never  PAP:2015  Bone density:Never   Allergies  Allergen Reactions  . Pineapple Anaphylaxis and Hives    Current Outpatient Prescriptions  Medication Sig Dispense Refill  . amLODipine (NORVASC) 5 MG tablet Take 5 mg by mouth daily.    . insulin aspart protamine- aspart (NOVOLOG MIX 70/30) (70-30) 100 UNIT/ML injection Inject 20 Units into the skin 2 (two) times daily with a meal.    . lidocaine-prilocaine (EMLA) cream Apply to affected area once 30 g 3  . LORazepam (ATIVAN) 0.5 MG tablet Take 1 tablet (0.5 mg total) by mouth every 6 (six) hours as needed (Nausea or vomiting). 30 tablet 0  . metFORMIN (GLUCOPHAGE) 1000 MG tablet Take 1,000 mg by mouth 2 (two) times daily with a meal.    . prochlorperazine (COMPAZINE) 10 MG tablet Take 1 tablet (10 mg total) by mouth every 6 (six) hours as needed (Nausea or vomiting). 30 tablet 1  . saccharomyces boulardii (FLORASTOR) 250 MG capsule Take 1 capsule (250 mg total) by mouth 2 (two) times daily. 60 capsule 0   No current facility-administered medications for this visit.     OBJECTIVE:  Vitals:   02/28/17 0948  BP: 132/69  Pulse: 94  Resp: 18  Temp: 98.5 F (36.9 C)     Body mass index is 36.67 kg/m.    ECOG FS:1 - Symptomatic but completely ambulatory  GENERAL: Patient is a well appearing female in no acute distress HEENT:  Sclerae anicteric. PERRL. Oropharynx clear and moist. No ulcerations or evidence of oropharyngeal candidiasis. Neck is supple.  NODES:  No cervical, supraclavicular, or axillary lymphadenopathy palpated.  BREAST EXAM:  Deferred. LUNGS:  Clear to auscultation bilaterally.  No  wheezes or rhonchi. HEART:  Regular rate and rhythm. No murmur appreciated. ABDOMEN:  Soft, nontender.  Positive, normoactive bowel sounds. No organomegaly palpated. MSK:  No focal spinal tenderness to palpation. Full range of motion bilaterally in the upper extremities. EXTREMITIES:  No peripheral edema.   SKIN:  Clear with no obvious rashes or skin changes. No nail dyscrasia. NEURO:  Nonfocal. Well oriented.  Appropriate affect.     LAB RESULTS:  CMP     Component Value Date/Time   NA 142 02/21/2017 0934   K 4.0 02/21/2017 0934   CL 109 02/14/2017 0453   CO2 23 02/21/2017 0934   GLUCOSE 113 02/21/2017 0934   GLUCOSE 333 (H) 09/05/2006 1306   BUN 13.4 02/21/2017 0934   CREATININE 1.2 (H) 02/21/2017 0934   CALCIUM 9.2 02/21/2017 0934   PROT 7.1 02/21/2017 0934   ALBUMIN 2.9 (L) 02/21/2017 0934   AST 19 02/21/2017 0934   ALT 14 02/21/2017 0934   ALKPHOS 81 02/21/2017 0934   BILITOT 0.25 02/21/2017 0934  GFRNONAA 60 (L) 02/14/2017 0453   GFRAA >60 02/14/2017 0453    INo results found for: SPEP, UPEP  Lab Results  Component Value Date   WBC 0.2 (LL) 02/28/2017   NEUTROABS 0.0 (LL) 02/28/2017   HGB 8.0 (L) 02/28/2017   HCT 24.4 (L) 02/28/2017   MCV 86.2 02/28/2017   PLT 167 02/28/2017      Chemistry      Component Value Date/Time   NA 142 02/21/2017 0934   K 4.0 02/21/2017 0934   CL 109 02/14/2017 0453   CO2 23 02/21/2017 0934   BUN 13.4 02/21/2017 0934   CREATININE 1.2 (H) 02/21/2017 0934      Component Value Date/Time   CALCIUM 9.2 02/21/2017 0934   ALKPHOS 81 02/21/2017 0934   AST 19 02/21/2017 0934   ALT 14 02/21/2017 0934   BILITOT 0.25 02/21/2017 0934       No results found for: LABCA2  No components found for: [id number]  No results for input(s): INR in the last 168 hours.  Urinalysis    Component Value Date/Time   COLORURINE YELLOW 02/09/2017 1300   APPEARANCEUR HAZY (A) 02/09/2017 1300   LABSPEC 1.013 02/09/2017 1300   LABSPEC 1.010  02/07/2017 1005   PHURINE 5.0 02/09/2017 1300   GLUCOSEU NEGATIVE 02/09/2017 1300   GLUCOSEU Negative 02/07/2017 1005   HGBUR NEGATIVE 02/09/2017 1300   BILIRUBINUR NEGATIVE 02/09/2017 1300   BILIRUBINUR Negative 02/07/2017 1005   KETONESUR NEGATIVE 02/09/2017 1300   PROTEINUR 100 (A) 02/09/2017 1300   UROBILINOGEN 0.2 02/07/2017 1005   NITRITE NEGATIVE 02/09/2017 1300   LEUKOCYTESUR NEGATIVE 02/09/2017 1300   LEUKOCYTESUR Negative 02/07/2017 1005     STUDIES: Dg Chest 2 View  Result Date: 02/09/2017 CLINICAL DATA:  Patient with fever.  History of breast cancer. EXAM: CHEST  2 VIEW COMPARISON:  Chest radiograph 02/07/2017 FINDINGS: Right anterior chest wall Port-A-Cath is present tip projecting over the superior vena cava. Anterior cervical spinal fusion hardware. Normal cardiac and mediastinal contours. No consolidative pulmonary opacities. No pleural effusion or pneumothorax. Regional skeleton is unremarkable. IMPRESSION: No active cardiopulmonary disease. Electronically Signed   By: Lovey Newcomer M.D.   On: 02/09/2017 10:19   Dg Chest 2 View  Result Date: 02/07/2017 CLINICAL DATA:  Neutropenic fever EXAM: CHEST  2 VIEW COMPARISON:  12/31/2016 FINDINGS: Lungs are clear without infiltrate effusion or mass. Negative for pneumonia. Heart size normal. Port-A-Cath tip in the lower SVC unchanged in position. IMPRESSION: No active cardiopulmonary disease. Electronically Signed   By: Franchot Gallo M.D.   On: 02/07/2017 14:16    ELIGIBLE FOR AVAILABLE RESEARCH PROTOCOL: No   ASSESSMENT: 51 y.o. 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 On a similar database  this risk drops to about 12% if chemotherapy is also given  (2) genetics testing sent 11/28/2016; The Breast/GYN gene panel offered by [facility] includes sequencing and rearrangement analysis for the following 23 genes:  ATM, BARD1, BRCA1, BRCA2, BRIP1, CDH1, CHEK2, EPCAM, FANCC, MLH1, MSH2, MSH6, MUTYH, NBN, NF1, PALB2, PMS2, POLD1, PTEN, RAD51C, RAD51D, RECQL, and TP53.   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    (3) status post left breast lumpectomy on 12/31/2016: invasive ductal carcinoma, 1.8cm, grade 3, margins negative, 1 SLN negative, ER+(80%), PR-(2%), Ki-67 90%, HER-2 negative (ratio 1.05).  T1cN0  (4) chemotherapy to consist of cyclophosphamide and doxorubicin in dose dense fashion 4 (started 01/17/17), followed by Abraxane weekly 12.   (5) adjuvant radiation to follow as appropriate.  PLAN: Abigail Yoder is doing well today.  She is neutropenic following her third cycle of chemotherapy.  I prescribed her cipro to take BID.  We reviewed neutropenic precautions.  Will continue to monitor her anemia, as she is not symptomatic.  She wants to know why she does not qualify for onpro.  I sent a message to our prior authorization specialist, Aleen Sells to help answer this question.  Abigail Yoder verbalized understanding of instructions.  She knows to call for any questions or concerns prior to her next visit.  We will see November in one week for labs, evaluation and cycle 4 of Doxorubicin and Cyclophosphamide.    A total of (30) minutes of face-to-face time was spent with this patient with greater than 50% of that time in counseling and care-coordination.    Scot Dock, NP   02/28/2017 10:03 AM Medical Oncology and Hematology [facility] [address] [place], [place] [address] Tel. [phone]    Fax. [phone]

## 2017-02-28 NOTE — Patient Instructions (Signed)
Dehydration, Adult Dehydration is a condition in which there is not enough fluid or water in the body. This happens when you lose more fluids than you take in. Important organs, such as the kidneys, brain, and heart, cannot function without a proper amount of fluids. Any loss of fluids from the body can lead to dehydration. Dehydration can range from mild to severe. This condition should be treated right away to prevent it from becoming severe. What are the causes? This condition may be caused by:  Vomiting.  Diarrhea.  Excessive sweating, such as from heat exposure or exercise.  Not drinking enough fluid, especially:  When ill.  While doing activity that requires a lot of energy.  Excessive urination.  Fever.  Infection.  Certain medicines, such as medicines that cause the body to lose excess fluid (diuretics).  Inability to access safe drinking water.  Reduced physical ability to get adequate water and food. What increases the risk? This condition is more likely to develop in people:  Who have a poorly controlled long-term (chronic) illness, such as diabetes, heart disease, or kidney disease.  Who are age 65 or older.  Who are disabled.  Who live in a place with high altitude.  Who play endurance sports. What are the signs or symptoms? Symptoms of mild dehydration may include:   Thirst.  Dry lips.  Slightly dry mouth.  Dry, warm skin.  Dizziness. Symptoms of moderate dehydration may include:   Very dry mouth.  Muscle cramps.  Dark urine. Urine may be the color of tea.  Decreased urine production.  Decreased tear production.  Heartbeat that is irregular or faster than normal (palpitations).  Headache.  Light-headedness, especially when you stand up from a sitting position.  Fainting (syncope). Symptoms of severe dehydration may include:   Changes in skin, such as:  Cold and clammy skin.  Blotchy (mottled) or pale skin.  Skin that does  not quickly return to normal after being lightly pinched and released (poor skin turgor).  Changes in body fluids, such as:  Extreme thirst.  No tear production.  Inability to sweat when body temperature is high, such as in hot weather.  Very little urine production.  Changes in vital signs, such as:  Weak pulse.  Pulse that is more than 100 beats a minute when sitting still.  Rapid breathing.  Low blood pressure.  Other changes, such as:  Sunken eyes.  Cold hands and feet.  Confusion.  Lack of energy (lethargy).  Difficulty waking up from sleep.  Short-term weight loss.  Unconsciousness. How is this diagnosed? This condition is diagnosed based on your symptoms and a physical exam. Blood and urine tests may be done to help confirm the diagnosis. How is this treated? Treatment for this condition depends on the severity. Mild or moderate dehydration can often be treated at home. Treatment should be started right away. Do not wait until dehydration becomes severe. Severe dehydration is an emergency and it needs to be treated in a hospital. Treatment for mild dehydration may include:   Drinking more fluids.  Replacing salts and minerals in your blood (electrolytes) that you may have lost. Treatment for moderate dehydration may include:   Drinking an oral rehydration solution (ORS). This is a drink that helps you replace fluids and electrolytes (rehydrate). It can be found at pharmacies and retail stores. Treatment for severe dehydration may include:   Receiving fluids through an IV tube.  Receiving an electrolyte solution through a feeding tube that is   passed through your nose and into your stomach (nasogastric tube, or NG tube).  Correcting any abnormalities in electrolytes.  Treating the underlying cause of dehydration. Follow these instructions at home:  If directed by your health care provider, drink an ORS:  Make an ORS by following instructions on the  package.  Start by drinking small amounts, about  cup (120 mL) every 5-10 minutes.  Slowly increase how much you drink until you have taken the amount recommended by your health care provider.  Drink enough clear fluid to keep your urine clear or pale yellow. If you were told to drink an ORS, finish the ORS first, then start slowly drinking other clear fluids. Drink fluids such as:  Water. Do not drink only water. Doing that can lead to having too little salt (sodium) in the body (hyponatremia).  Ice chips.  Fruit juice that you have added water to (diluted fruit juice).  Low-calorie sports drinks.  Avoid:  Alcohol.  Drinks that contain a lot of sugar. These include high-calorie sports drinks, fruit juice that is not diluted, and soda.  Caffeine.  Foods that are greasy or contain a lot of fat or sugar.  Take over-the-counter and prescription medicines only as told by your health care provider.  Do not take sodium tablets. This can lead to having too much sodium in the body (hypernatremia).  Eat foods that contain a healthy balance of electrolytes, such as bananas, oranges, potatoes, tomatoes, and spinach.  Keep all follow-up visits as told by your health care provider. This is important. Contact a health care provider if:  You have abdominal pain that:  Gets worse.  Stays in one area (localizes).  You have a rash.  You have a stiff neck.  You are more irritable than usual.  You are sleepier or more difficult to wake up than usual.  You feel weak or dizzy.  You feel very thirsty.  You have urinated only a small amount of very dark urine over 6-8 hours. Get help right away if:  You have symptoms of severe dehydration.  You cannot drink fluids without vomiting.  Your symptoms get worse with treatment.  You have a fever.  You have a severe headache.  You have vomiting or diarrhea that:  Gets worse.  Does not go away.  You have blood or green matter  (bile) in your vomit.  You have blood in your stool. This may cause stool to look black and tarry.  You have not urinated in 6-8 hours.  You faint.  Your heart rate while sitting still is over 100 beats a minute.  You have trouble breathing. This information is not intended to replace advice given to you by your health care provider. Make sure you discuss any questions you have with your health care provider. Document Released: 11/05/2005 Document Revised: 06/01/2016 Document Reviewed: 12/30/2015 Elsevier Interactive Patient Education  2017 [employer]vier Inc[employer]  

## 2017-03-01 ENCOUNTER — Other Ambulatory Visit: Payer: Self-pay

## 2017-03-01 ENCOUNTER — Ambulatory Visit (HOSPITAL_BASED_OUTPATIENT_CLINIC_OR_DEPARTMENT_OTHER): Payer: BLUE CROSS/BLUE SHIELD | Admitting: Nurse Practitioner

## 2017-03-01 VITALS — BP 138/82 | HR 98 | Temp 99.2°F | Resp 18 | Ht 63.0 in | Wt 209.6 lb

## 2017-03-01 DIAGNOSIS — C50212 Malignant neoplasm of upper-inner quadrant of left female breast: Secondary | ICD-10-CM

## 2017-03-01 DIAGNOSIS — T451X5A Adverse effect of antineoplastic and immunosuppressive drugs, initial encounter: Principal | ICD-10-CM

## 2017-03-01 DIAGNOSIS — D61810 Antineoplastic chemotherapy induced pancytopenia: Secondary | ICD-10-CM

## 2017-03-01 MED ORDER — HEPARIN SOD (PORK) LOCK FLUSH 100 UNIT/ML IV SOLN
500.0000 [IU] | Freq: Once | INTRAVENOUS | Status: AC
  Administered 2017-03-01: 500 [IU] via INTRAVENOUS
  Filled 2017-03-01: qty 5

## 2017-03-01 MED ORDER — SODIUM CHLORIDE 0.9 % IV SOLN
1000.0000 mL | Freq: Once | INTRAVENOUS | Status: AC
  Administered 2017-03-01: 1000 mL via INTRAVENOUS

## 2017-03-01 MED ORDER — SODIUM CHLORIDE 0.9% FLUSH
10.0000 mL | Freq: Once | INTRAVENOUS | Status: AC
  Administered 2017-03-01: 10 mL via INTRAVENOUS
  Filled 2017-03-01: qty 10

## 2017-03-01 NOTE — Progress Notes (Signed)
RN visit for IV fluids  Temp 99.2  ANC 0.0  Dr. Jana Hakim made aware. Pt started Cipro yesterday

## 2017-03-01 NOTE — Patient Instructions (Signed)
Neutropenia Neutropenia is a condition that occurs when you have a lower-than-normal level of a type of white blood cell (neutrophil) in your body. Neutrophils are made in the spongy center of large bones (bone marrow) and they fight infections. Neutrophils are your body's main defense against bacterial and fungal infections. The fewer neutrophils you have and the longer your body remains without them, the greater your risk of getting a severe infection. What are the causes? This condition can occur if your body uses up or destroys neutrophils faster than your bone marrow can make them. This problem may happen because of:  Bacterial or fungal infection.  Allergic disorders.  Reactions to some medicines.  Autoimmune disease.  An enlarged spleen. This condition can also occur if your bone marrow does not produce enough neutrophils. This problem may be caused by:  Cancer.  Cancer treatments, such as radiation or chemotherapy.  Viral infections.  Medicines, such as phenytoin.  Vitamin B12 deficiency.  Diseases of the bone marrow.  Environmental toxins, such as insecticides. What are the signs or symptoms? This condition does not usually cause symptoms. If symptoms are present, they are usually caused by an underlying infection. Symptoms of an infection may include:  Fever.  Chills.  Swollen glands.  Oral or anal ulcers.  Cough and shortness of breath.  Rash.  Skin infection.  Fatigue. How is this diagnosed? Your health care provider may suspect neutropenia if you have:  A condition that may cause neutropenia.  Symptoms of infection, especially fever.  Frequent and unusual infections. You will have a medical history and physical exam. Tests will also be done, such as:  A complete blood count (CBC).  A procedure to collect a sample of bone marrow for examination (bone marrow biopsy).  A chest X-ray.  A urine culture.  A blood culture. How is this  treated? Treatment depends on the underlying cause and severity of your condition. Mild neutropenia may not require treatment. Treatment may include medicines, such as:  Antibiotic medicine given through an IV tube.  Antiviral medicines.  Antifungal medicines.  A medicine to increase neutrophil production (colony-stimulating factor). You may get this drug through an IV tube or by injection.  Steroids given through an IV tube. If an underlying condition is causing neutropenia, you may need treatment for that condition. If medicines you are taking are causing neutropenia, your health care provider may have you stop taking those medicines. Follow these instructions at home: Medicines   Take over-the-counter and prescription medicines only as told by your health care provider.  Get a seasonal flu shot (influenza vaccine). Lifestyle   Do not eat unpasteurized foods.Do not eat unwashed raw fruits or vegetables.  Avoid exposure to groups of people or children.  Avoid being around people who are sick.  Avoid being around dirt or dust, such as in construction areas or gardens.  Do not provide direct care for pets. Avoid animal droppings. Do not clean litter boxes and bird cages. Hygiene    Bathe daily.  Clean the area between the genitals and the anus (perineal area) after you urinate or have a bowel movement. If you are female, wipe from front to back.  Brush your teeth with a soft toothbrush before and after meals.  Do not use a razor that has a blade. Use an electric razor to remove hair.  Wash your hands often. Make sure others who come in contact with you also wash their hands. If soap and water are not available,  use hand sanitizer. General instructions   Do not have sex unless your health care provider has approved.  Take actions to avoid cuts and burns. For example:  Be cautious when you use knives. Always cut away from yourself.  Keep knives in protective sheaths or  guards when not in use.  Use oven mitts when you cook with a hot stove, oven, or grill.  Stand a safe distance away from open fires.  Avoid people who received a vaccine in the past 30 days if that vaccine contained a live version of the germ (live vaccine). You should not get a live vaccine. Common live vaccines are varicella, measles, mumps, and rubella.  Do not share food utensils.  Do not use tampons, enemas, or rectal suppositories unless your health care provider has approved.  Keep all appointments as told by your health care provider. This is important. Contact a health care provider if:  You have a fever.  You have chills or you start to shake.  You have:  A sore throat.  A warm, red, or tender area on your skin.  A cough.  Frequent or painful urination.  Vaginal discharge or itching.  You develop:  Sores in your mouth or anus.  Swollen lymph nodes.  Red streaks on the skin.  A rash.  You feel:  Nauseous or you vomit.  Very fatigued.  Short of breath. This information is not intended to replace advice given to you by your health care provider. Make sure you discuss any questions you have with your health care provider. Document Released: 04/27/2002 Document Revised: 04/12/2016 Document Reviewed: 05/18/2015 Elsevier Interactive Patient Education  2017 [employer] Dehydration, Adult Dehydration is a condition in which there is not enough fluid or water in the body. This happens when you lose more fluids than you take in. Important organs, such as the kidneys, brain, and heart, cannot function without a proper amount of fluids. Any loss of fluids from the body can lead to dehydration. Dehydration can range from mild to severe. This condition should be treated right away to prevent it from becoming severe. What are the causes? This condition may be caused by:  Vomiting.  Diarrhea.  Excessive sweating, such as from heat exposure or exercise.  Not  drinking enough fluid, especially:  When ill.  While doing activity that requires a lot of energy.  Excessive urination.  Fever.  Infection.  Certain medicines, such as medicines that cause the body to lose excess fluid (diuretics).  Inability to access safe drinking water.  Reduced physical ability to get adequate water and food. What increases the risk? This condition is more likely to develop in people:  Who have a poorly controlled long-term (chronic) illness, such as diabetes, heart disease, or kidney disease.  Who are age 65 or older.  Who are disabled.  Who live in a place with high altitude.  Who play endurance sports. What are the signs or symptoms? Symptoms of mild dehydration may include:   Thirst.  Dry lips.  Slightly dry mouth.  Dry, warm skin.  Dizziness. Symptoms of moderate dehydration may include:   Very dry mouth.  Muscle cramps.  Dark urine. Urine may be the color of tea.  Decreased urine production.  Decreased tear production.  Heartbeat that is irregular or faster than normal (palpitations).  Headache.  Light-headedness, especially when you stand up from a sitting position.  Fainting (syncope). Symptoms of severe dehydration may include:   Changes in skin, such as:    Cold and clammy skin.  Blotchy (mottled) or pale skin.  Skin that does not quickly return to normal after being lightly pinched and released (poor skin turgor).  Changes in body fluids, such as:  Extreme thirst.  No tear production.  Inability to sweat when body temperature is high, such as in hot weather.  Very little urine production.  Changes in vital signs, such as:  Weak pulse.  Pulse that is more than 100 beats a minute when sitting still.  Rapid breathing.  Low blood pressure.  Other changes, such as:  Sunken eyes.  Cold hands and feet.  Confusion.  Lack of energy (lethargy).  Difficulty waking up from sleep.  Short-term  weight loss.  Unconsciousness. How is this diagnosed? This condition is diagnosed based on your symptoms and a physical exam. Blood and urine tests may be done to help confirm the diagnosis. How is this treated? Treatment for this condition depends on the severity. Mild or moderate dehydration can often be treated at home. Treatment should be started right away. Do not wait until dehydration becomes severe. Severe dehydration is an emergency and it needs to be treated in a hospital. Treatment for mild dehydration may include:   Drinking more fluids.  Replacing salts and minerals in your blood (electrolytes) that you may have lost. Treatment for moderate dehydration may include:   Drinking an oral rehydration solution (ORS). This is a drink that helps you replace fluids and electrolytes (rehydrate). It can be found at pharmacies and retail stores. Treatment for severe dehydration may include:   Receiving fluids through an IV tube.  Receiving an electrolyte solution through a feeding tube that is passed through your nose and into your stomach (nasogastric tube, or NG tube).  Correcting any abnormalities in electrolytes.  Treating the underlying cause of dehydration. Follow these instructions at home:  If directed by your health care provider, drink an ORS:  Make an ORS by following instructions on the package.  Start by drinking small amounts, about  cup (120 mL) every 5-10 minutes.  Slowly increase how much you drink until you have taken the amount recommended by your health care provider.  Drink enough clear fluid to keep your urine clear or pale yellow. If you were told to drink an ORS, finish the ORS first, then start slowly drinking other clear fluids. Drink fluids such as:  Water. Do not drink only water. Doing that can lead to having too little salt (sodium) in the body (hyponatremia).  Ice chips.  Fruit juice that you have added water to (diluted fruit  juice).  Low-calorie sports drinks.  Avoid:  Alcohol.  Drinks that contain a lot of sugar. These include high-calorie sports drinks, fruit juice that is not diluted, and soda.  Caffeine.  Foods that are greasy or contain a lot of fat or sugar.  Take over-the-counter and prescription medicines only as told by your health care provider.  Do not take sodium tablets. This can lead to having too much sodium in the body (hypernatremia).  Eat foods that contain a healthy balance of electrolytes, such as bananas, oranges, potatoes, tomatoes, and spinach.  Keep all follow-up visits as told by your health care provider. This is important. Contact a health care provider if:  You have abdominal pain that:  Gets worse.  Stays in one area (localizes).  You have a rash.  You have a stiff neck.  You are more irritable than usual.  You are sleepier or more difficult to   wake up than usual.  You feel weak or dizzy.  You feel very thirsty.  You have urinated only a small amount of very dark urine over 6-8 hours. Get help right away if:  You have symptoms of severe dehydration.  You cannot drink fluids without vomiting.  Your symptoms get worse with treatment.  You have a fever.  You have a severe headache.  You have vomiting or diarrhea that:  Gets worse.  Does not go away.  You have blood or green matter (bile) in your vomit.  You have blood in your stool. This may cause stool to look black and tarry.  You have not urinated in 6-8 hours.  You faint.  Your heart rate while sitting still is over 100 beats a minute.  You have trouble breathing. This information is not intended to replace advice given to you by your health care provider. Make sure you discuss any questions you have with your health care provider. Document Released: 11/05/2005 Document Revised: 06/01/2016 Document Reviewed: 12/30/2015 Elsevier Interactive Patient Education  2017 Elsevier Inc.  

## 2017-03-04 ENCOUNTER — Other Ambulatory Visit (HOSPITAL_BASED_OUTPATIENT_CLINIC_OR_DEPARTMENT_OTHER): Payer: BLUE CROSS/BLUE SHIELD

## 2017-03-04 ENCOUNTER — Ambulatory Visit (HOSPITAL_BASED_OUTPATIENT_CLINIC_OR_DEPARTMENT_OTHER): Payer: BLUE CROSS/BLUE SHIELD | Admitting: Nurse Practitioner

## 2017-03-04 DIAGNOSIS — C50212 Malignant neoplasm of upper-inner quadrant of left female breast: Secondary | ICD-10-CM

## 2017-03-04 DIAGNOSIS — Z170 Estrogen receptor positive status [ER+]: Principal | ICD-10-CM

## 2017-03-04 LAB — COMPREHENSIVE METABOLIC PANEL
ALBUMIN: 3.1 g/dL — AB (ref 3.5–5.0)
ALT: 13 U/L (ref 0–55)
ANION GAP: 11 meq/L (ref 3–11)
AST: 14 U/L (ref 5–34)
Alkaline Phosphatase: 68 U/L (ref 40–150)
BUN: 21.4 mg/dL (ref 7.0–26.0)
CALCIUM: 8.5 mg/dL (ref 8.4–10.4)
CHLORIDE: 107 meq/L (ref 98–109)
CO2: 26 mEq/L (ref 22–29)
CREATININE: 2 mg/dL — AB (ref 0.6–1.1)
EGFR: 33 mL/min/{1.73_m2} — ABNORMAL LOW (ref >90)
Glucose: 67 mg/dl — ABNORMAL LOW (ref 70–140)
Potassium: 4.2 mEq/L (ref 3.5–5.1)
Sodium: 144 mEq/L (ref 136–145)
Total Bilirubin: 0.24 mg/dL (ref 0.20–1.20)
Total Protein: 7.1 g/dL (ref 6.4–8.3)

## 2017-03-04 LAB — CBC WITH DIFFERENTIAL/PLATELET
BASO%: 0.2 % (ref 0.0–2.0)
Basophils Absolute: 0 10*3/uL (ref 0.0–0.1)
EOS%: 0.2 % (ref 0.0–7.0)
Eosinophils Absolute: 0 10*3/uL (ref 0.0–0.5)
HEMATOCRIT: 23.7 % — AB (ref 34.8–46.6)
HEMOGLOBIN: 7.8 g/dL — AB (ref 11.6–15.9)
LYMPH%: 12.9 % — AB (ref 14.0–49.7)
MCH: 28.3 pg (ref 25.1–34.0)
MCHC: 32.9 g/dL (ref 31.5–36.0)
MCV: 85.9 fL (ref 79.5–101.0)
MONO#: 0.3 10*3/uL (ref 0.1–0.9)
MONO%: 6.8 % (ref 0.0–14.0)
NEUT%: 79.9 % — AB (ref 38.4–76.8)
NEUTROS ABS: 3.4 10*3/uL (ref 1.5–6.5)
PLATELETS: 73 10*3/uL — AB (ref 145–400)
RBC: 2.76 10*6/uL — ABNORMAL LOW (ref 3.70–5.45)
RDW: 15.2 % — ABNORMAL HIGH (ref 11.2–14.5)
WBC: 4.3 10*3/uL (ref 3.9–10.3)
lymph#: 0.6 10*3/uL — ABNORMAL LOW (ref 0.9–3.3)
nRBC: 0 % (ref 0–0)

## 2017-03-04 MED ORDER — SODIUM CHLORIDE 0.9 % IV SOLN
1000.0000 mL | Freq: Once | INTRAVENOUS | Status: AC
  Administered 2017-03-04: 1000 mL via INTRAVENOUS

## 2017-03-04 MED ORDER — HEPARIN SOD (PORK) LOCK FLUSH 100 UNIT/ML IV SOLN
500.0000 [IU] | Freq: Once | INTRAVENOUS | Status: AC
  Administered 2017-03-04: 500 [IU] via INTRAVENOUS
  Filled 2017-03-04: qty 5

## 2017-03-04 MED ORDER — SODIUM CHLORIDE 0.9% FLUSH
10.0000 mL | Freq: Once | INTRAVENOUS | Status: AC
  Administered 2017-03-04: 10 mL via INTRAVENOUS
  Filled 2017-03-04: qty 10

## 2017-03-04 NOTE — Progress Notes (Signed)
RN visit for IV fluids.  Pt had labs done today. Neutrophils have recovered but HGB is 7.8. Pt is not symptomatic.  Platelets are 73k and again not symptomatic.  Dr. Jana Hakim made aware.

## 2017-03-04 NOTE — Patient Instructions (Signed)
Dehydration, Adult Dehydration is a condition in which there is not enough fluid or water in the body. This happens when you lose more fluids than you take in. Important organs, such as the kidneys, brain, and heart, cannot function without a proper amount of fluids. Any loss of fluids from the body can lead to dehydration. Dehydration can range from mild to severe. This condition should be treated right away to prevent it from becoming severe. What are the causes? This condition may be caused by:  Vomiting.  Diarrhea.  Excessive sweating, such as from heat exposure or exercise.  Not drinking enough fluid, especially:  When ill.  While doing activity that requires a lot of energy.  Excessive urination.  Fever.  Infection.  Certain medicines, such as medicines that cause the body to lose excess fluid (diuretics).  Inability to access safe drinking water.  Reduced physical ability to get adequate water and food. What increases the risk? This condition is more likely to develop in people:  Who have a poorly controlled long-term (chronic) illness, such as diabetes, heart disease, or kidney disease.  Who are age 65 or older.  Who are disabled.  Who live in a place with high altitude.  Who play endurance sports. What are the signs or symptoms? Symptoms of mild dehydration may include:   Thirst.  Dry lips.  Slightly dry mouth.  Dry, warm skin.  Dizziness. Symptoms of moderate dehydration may include:   Very dry mouth.  Muscle cramps.  Dark urine. Urine may be the color of tea.  Decreased urine production.  Decreased tear production.  Heartbeat that is irregular or faster than normal (palpitations).  Headache.  Light-headedness, especially when you stand up from a sitting position.  Fainting (syncope). Symptoms of severe dehydration may include:   Changes in skin, such as:  Cold and clammy skin.  Blotchy (mottled) or pale skin.  Skin that does  not quickly return to normal after being lightly pinched and released (poor skin turgor).  Changes in body fluids, such as:  Extreme thirst.  No tear production.  Inability to sweat when body temperature is high, such as in hot weather.  Very little urine production.  Changes in vital signs, such as:  Weak pulse.  Pulse that is more than 100 beats a minute when sitting still.  Rapid breathing.  Low blood pressure.  Other changes, such as:  Sunken eyes.  Cold hands and feet.  Confusion.  Lack of energy (lethargy).  Difficulty waking up from sleep.  Short-term weight loss.  Unconsciousness. How is this diagnosed? This condition is diagnosed based on your symptoms and a physical exam. Blood and urine tests may be done to help confirm the diagnosis. How is this treated? Treatment for this condition depends on the severity. Mild or moderate dehydration can often be treated at home. Treatment should be started right away. Do not wait until dehydration becomes severe. Severe dehydration is an emergency and it needs to be treated in a hospital. Treatment for mild dehydration may include:   Drinking more fluids.  Replacing salts and minerals in your blood (electrolytes) that you may have lost. Treatment for moderate dehydration may include:   Drinking an oral rehydration solution (ORS). This is a drink that helps you replace fluids and electrolytes (rehydrate). It can be found at pharmacies and retail stores. Treatment for severe dehydration may include:   Receiving fluids through an IV tube.  Receiving an electrolyte solution through a feeding tube that is   passed through your nose and into your stomach (nasogastric tube, or NG tube).  Correcting any abnormalities in electrolytes.  Treating the underlying cause of dehydration. Follow these instructions at home:  If directed by your health care provider, drink an ORS:  Make an ORS by following instructions on the  package.  Start by drinking small amounts, about  cup (120 mL) every 5-10 minutes.  Slowly increase how much you drink until you have taken the amount recommended by your health care provider.  Drink enough clear fluid to keep your urine clear or pale yellow. If you were told to drink an ORS, finish the ORS first, then start slowly drinking other clear fluids. Drink fluids such as:  Water. Do not drink only water. Doing that can lead to having too little salt (sodium) in the body (hyponatremia).  Ice chips.  Fruit juice that you have added water to (diluted fruit juice).  Low-calorie sports drinks.  Avoid:  Alcohol.  Drinks that contain a lot of sugar. These include high-calorie sports drinks, fruit juice that is not diluted, and soda.  Caffeine.  Foods that are greasy or contain a lot of fat or sugar.  Take over-the-counter and prescription medicines only as told by your health care provider.  Do not take sodium tablets. This can lead to having too much sodium in the body (hypernatremia).  Eat foods that contain a healthy balance of electrolytes, such as bananas, oranges, potatoes, tomatoes, and spinach.  Keep all follow-up visits as told by your health care provider. This is important. Contact a health care provider if:  You have abdominal pain that:  Gets worse.  Stays in one area (localizes).  You have a rash.  You have a stiff neck.  You are more irritable than usual.  You are sleepier or more difficult to wake up than usual.  You feel weak or dizzy.  You feel very thirsty.  You have urinated only a small amount of very dark urine over 6-8 hours. Get help right away if:  You have symptoms of severe dehydration.  You cannot drink fluids without vomiting.  Your symptoms get worse with treatment.  You have a fever.  You have a severe headache.  You have vomiting or diarrhea that:  Gets worse.  Does not go away.  You have blood or green matter  (bile) in your vomit.  You have blood in your stool. This may cause stool to look black and tarry.  You have not urinated in 6-8 hours.  You faint.  Your heart rate while sitting still is over 100 beats a minute.  You have trouble breathing. This information is not intended to replace advice given to you by your health care provider. Make sure you discuss any questions you have with your health care provider. Document Released: 11/05/2005 Document Revised: 06/01/2016 Document Reviewed: 12/30/2015 Elsevier Interactive Patient Education  2017 [employer]vier Inc[employer]  

## 2017-03-05 ENCOUNTER — Telehealth: Payer: Self-pay

## 2017-03-05 NOTE — Telephone Encounter (Signed)
-----   Message from Gardenia Phlegm, NP sent at 03/05/2017 12:16 PM EDT ----- Please call patient and see how she feels.  Is she up for getting blood tomorrow?

## 2017-03-05 NOTE — Telephone Encounter (Signed)
Explained to pt she needs blood d/t low HGB even though she is asymptomatic b/c she is getting chemo treatment and we don't want to drob hgb lower. She is willing to come to [facility] at 0800 tomorrow, go to [facility] for her echo, and return to [facility] or sickle cell for the blood transfusion.   She is aware that sickle cell is closed at present and we will be giving her the specific time of her appt tomorrow morning.

## 2017-03-06 ENCOUNTER — Ambulatory Visit (HOSPITAL_BASED_OUTPATIENT_CLINIC_OR_DEPARTMENT_OTHER)
Admission: RE | Admit: 2017-03-06 | Discharge: 2017-03-06 | Disposition: A | Discharge status: Home / Self Care | Payer: BLUE CROSS/BLUE SHIELD | Source: Ambulatory Visit | Attending: Cardiology | Admitting: Cardiology

## 2017-03-06 ENCOUNTER — Ambulatory Visit (HOSPITAL_BASED_OUTPATIENT_CLINIC_OR_DEPARTMENT_OTHER): Payer: BLUE CROSS/BLUE SHIELD

## 2017-03-06 ENCOUNTER — Ambulatory Visit (HOSPITAL_COMMUNITY)
Admission: RE | Admit: 2017-03-06 | Discharge: 2017-03-06 | Disposition: A | Discharge status: Home / Self Care | Payer: BLUE CROSS/BLUE SHIELD | Source: Ambulatory Visit | Attending: Internal Medicine | Admitting: Internal Medicine

## 2017-03-06 ENCOUNTER — Other Ambulatory Visit: Payer: Self-pay | Admitting: Adult Health

## 2017-03-06 ENCOUNTER — Other Ambulatory Visit: Payer: Self-pay | Admitting: *Deleted

## 2017-03-06 ENCOUNTER — Ambulatory Visit (HOSPITAL_COMMUNITY)
Admission: RE | Admit: 2017-03-06 | Discharge: 2017-03-06 | Disposition: A | Discharge status: Home / Self Care | Payer: BLUE CROSS/BLUE SHIELD | Source: Ambulatory Visit | Attending: Oncology | Admitting: Oncology

## 2017-03-06 ENCOUNTER — Encounter (HOSPITAL_COMMUNITY): Payer: Self-pay

## 2017-03-06 ENCOUNTER — Telehealth: Payer: Self-pay | Admitting: *Deleted

## 2017-03-06 VITALS — BP 147/71 | HR 88 | Wt 211.5 lb

## 2017-03-06 DIAGNOSIS — I071 Rheumatic tricuspid insufficiency: Secondary | ICD-10-CM | POA: Diagnosis not present

## 2017-03-06 DIAGNOSIS — Z170 Estrogen receptor positive status [ER+]: Principal | ICD-10-CM

## 2017-03-06 DIAGNOSIS — Z79899 Other long term (current) drug therapy: Secondary | ICD-10-CM | POA: Insufficient documentation

## 2017-03-06 DIAGNOSIS — Z8261 Family history of arthritis: Secondary | ICD-10-CM | POA: Insufficient documentation

## 2017-03-06 DIAGNOSIS — Z87891 Personal history of nicotine dependence: Secondary | ICD-10-CM | POA: Diagnosis not present

## 2017-03-06 DIAGNOSIS — D649 Anemia, unspecified: Secondary | ICD-10-CM

## 2017-03-06 DIAGNOSIS — Z833 Family history of diabetes mellitus: Secondary | ICD-10-CM | POA: Insufficient documentation

## 2017-03-06 DIAGNOSIS — Z823 Family history of stroke: Secondary | ICD-10-CM | POA: Insufficient documentation

## 2017-03-06 DIAGNOSIS — C50212 Malignant neoplasm of upper-inner quadrant of left female breast: Secondary | ICD-10-CM | POA: Diagnosis not present

## 2017-03-06 DIAGNOSIS — C50912 Malignant neoplasm of unspecified site of left female breast: Secondary | ICD-10-CM | POA: Insufficient documentation

## 2017-03-06 DIAGNOSIS — Z803 Family history of malignant neoplasm of breast: Secondary | ICD-10-CM | POA: Insufficient documentation

## 2017-03-06 DIAGNOSIS — E119 Type 2 diabetes mellitus without complications: Secondary | ICD-10-CM | POA: Diagnosis not present

## 2017-03-06 DIAGNOSIS — I10 Essential (primary) hypertension: Secondary | ICD-10-CM | POA: Insufficient documentation

## 2017-03-06 DIAGNOSIS — I313 Pericardial effusion (noninflammatory): Secondary | ICD-10-CM | POA: Diagnosis not present

## 2017-03-06 DIAGNOSIS — Z794 Long term (current) use of insulin: Secondary | ICD-10-CM | POA: Diagnosis not present

## 2017-03-06 DIAGNOSIS — Z8249 Family history of ischemic heart disease and other diseases of the circulatory system: Secondary | ICD-10-CM | POA: Diagnosis not present

## 2017-03-06 LAB — PREPARE RBC (CROSSMATCH)

## 2017-03-06 MED ORDER — ACETAMINOPHEN 325 MG PO TABS
650.0000 mg | ORAL_TABLET | Freq: Once | ORAL | Status: AC
  Administered 2017-03-06: 650 mg via ORAL

## 2017-03-06 MED ORDER — ACETAMINOPHEN 325 MG PO TABS
ORAL_TABLET | ORAL | Status: AC
  Filled 2017-03-06: qty 2

## 2017-03-06 MED ORDER — DIPHENHYDRAMINE HCL 25 MG PO CAPS
25.0000 mg | ORAL_CAPSULE | Freq: Once | ORAL | Status: AC
  Administered 2017-03-06: 25 mg via ORAL

## 2017-03-06 MED ORDER — SODIUM CHLORIDE 0.9 % IV SOLN
250.0000 mL | Freq: Once | INTRAVENOUS | Status: AC
  Administered 2017-03-06: 250 mL via INTRAVENOUS

## 2017-03-06 MED ORDER — HEPARIN SOD (PORK) LOCK FLUSH 100 UNIT/ML IV SOLN
500.0000 [IU] | Freq: Every day | INTRAVENOUS | Status: AC | PRN
  Administered 2017-03-06: 500 [IU]
  Filled 2017-03-06: qty 5

## 2017-03-06 MED ORDER — SODIUM CHLORIDE 0.9% FLUSH
10.0000 mL | INTRAVENOUS | Status: AC | PRN
  Administered 2017-03-06: 10 mL
  Filled 2017-03-06: qty 10

## 2017-03-06 MED ORDER — DIPHENHYDRAMINE HCL 25 MG PO CAPS
ORAL_CAPSULE | ORAL | Status: AC
  Filled 2017-03-06: qty 1

## 2017-03-06 NOTE — Progress Notes (Signed)
  Echocardiogram 2D Echocardiogram has been performed.  Donata Clay 03/06/2017, 10:04 AM

## 2017-03-06 NOTE — Patient Instructions (Signed)
Follow up in 6 months with echocardiogram and follow up with Dr. Aundra Dubin. We will call you closer to this time, or you may call our office to schedule 1 month before you are due to be seen.  Do the following things EVERYDAY: 1) Weigh yourself in the morning before breakfast. Write it down and keep it in a log. 2) Take your medicines as prescribed 3) Eat low salt foods-Limit salt (sodium) to 2000 mg per day.  4) Stay as active as you can everyday 5) Limit all fluids for the day to less than 2 liters

## 2017-03-06 NOTE — Patient Instructions (Signed)

## 2017-03-06 NOTE — Telephone Encounter (Signed)
This RN spoke with pt for need for blood transfusion - informed her appointment scheduled for 2 pm today at this office.  Abigail Yoder confirmed above.

## 2017-03-06 NOTE — Progress Notes (Signed)
Oncology: Dr. Jana Hakim  51 yo with history of HTN and DM presents for cardio-oncology evaluation.  Left breast cancer was diagnosed in 12/17.  ER+/PR+/HER2-.  Plan for cyclophosphamide + doxorubicin x 4 cycles then abraxane x 12 cycles.  She has 1 cycle cyclophosphamide/doxorubicin left.  She had lumpectomy 2/18.  Will have radiation.   No known cardiac disease.  No exertional dyspnea, no chest pain.  Overall doing ok.   PMH: 1. Type II diabetes 2. HTN 3. Breast cancer: Left breast cancer diagnosed in 12/17.  ER+/PR+/HER2-.  Plan for cyclophosphamide + doxorubicin x 4 cycles then abraxane x 12 cycles.  She had lumpectomy 2/18.  Will have radiation.  - Echo (2/18): EF 60-65%, GLS -23.6%.  - Echo (4/18): EF 60-65%, strain not complete on this study, normal RV size and systolic function.   Social History   Social History  . Marital status: Married    Spouse name: N/A  . Number of children: 2  . Years of education: N/A   Occupational History  . Not on file.   Social History Main Topics  . Smoking status: Former Smoker    Quit date: 11/19/1994  . Smokeless tobacco: Never Used  . Alcohol use No  . Drug use: No  . Sexual activity: Yes    Birth control/ protection: Surgical   Other Topics Concern  . Not on file   Social History Narrative  . No narrative on file   Family History  Problem Relation Age of Onset  . Diabetes Mother   . Arthritis Mother   . Hypertension Father   . Heart disease Father   . Hyperlipidemia Father   . Breast cancer Sister 28    came back 17 years later  . Stroke Maternal Uncle   . Stroke Maternal Grandmother    ROS: All systems reviewed and negative except as per HPI.   Current Outpatient Prescriptions  Medication Sig Dispense Refill  . amLODipine (NORVASC) 5 MG tablet Take 5 mg by mouth daily.    . insulin aspart protamine- aspart (NOVOLOG MIX 70/30) (70-30) 100 UNIT/ML injection Inject 20 Units into the skin 2 (two) times daily with a meal.     . lidocaine-prilocaine (EMLA) cream Apply to affected area once 30 g 3  . LORazepam (ATIVAN) 0.5 MG tablet Take 1 tablet (0.5 mg total) by mouth every 6 (six) hours as needed (Nausea or vomiting). 30 tablet 0  . prochlorperazine (COMPAZINE) 10 MG tablet Take 1 tablet (10 mg total) by mouth every 6 (six) hours as needed (Nausea or vomiting). 30 tablet 1  . saccharomyces boulardii (FLORASTOR) 250 MG capsule Take 1 capsule (250 mg total) by mouth 2 (two) times daily. 60 capsule 0   No current facility-administered medications for this encounter.    BP (!) 147/71   Pulse 88   Wt 211 lb 8 oz (95.9 kg)   SpO2 100%   BMI 37.47 kg/m  General: NAD Neck: JVP 7 cm, no thyromegaly or thyroid nodule.  Lungs: CTAB CV: Nondisplaced PMI.  Heart regular S1/S2, no S3/S4, no murmur.  No peripheral edema.  No carotid bruit.  Normal pedal pulses.  Abdomen: Soft, nontender, no hepatosplenomegaly, no distention.  Skin: Intact without lesions or rashes.  Neurologic: Alert and oriented x 3.  Psych: Normal affect. Extremities: No clubbing or cyanosis.  HEENT: Normal.   Assessment/Plan: 1. Breast cancer: She has 1 cycle of cyclophosphamide + doxorubicin left.  I reviewed today's echo: EF remains normal.  I will arrange for echo in 6 months to screen for any late effects of .  2. HTN: BP mildly elevated, she says that it has been ok at home.    Loralie Champagne 03/06/2017

## 2017-03-07 ENCOUNTER — Encounter: Payer: Self-pay | Admitting: *Deleted

## 2017-03-07 ENCOUNTER — Ambulatory Visit (HOSPITAL_BASED_OUTPATIENT_CLINIC_OR_DEPARTMENT_OTHER): Payer: BLUE CROSS/BLUE SHIELD

## 2017-03-07 ENCOUNTER — Other Ambulatory Visit (HOSPITAL_BASED_OUTPATIENT_CLINIC_OR_DEPARTMENT_OTHER): Payer: BLUE CROSS/BLUE SHIELD

## 2017-03-07 ENCOUNTER — Ambulatory Visit (HOSPITAL_BASED_OUTPATIENT_CLINIC_OR_DEPARTMENT_OTHER): Payer: BLUE CROSS/BLUE SHIELD | Admitting: Adult Health

## 2017-03-07 VITALS — BP 154/79 | HR 78 | Temp 98.3°F | Resp 18 | Ht 63.0 in | Wt 210.5 lb

## 2017-03-07 DIAGNOSIS — Z5111 Encounter for antineoplastic chemotherapy: Secondary | ICD-10-CM

## 2017-03-07 DIAGNOSIS — Z170 Estrogen receptor positive status [ER+]: Secondary | ICD-10-CM | POA: Diagnosis not present

## 2017-03-07 DIAGNOSIS — C50212 Malignant neoplasm of upper-inner quadrant of left female breast: Secondary | ICD-10-CM

## 2017-03-07 DIAGNOSIS — D649 Anemia, unspecified: Secondary | ICD-10-CM

## 2017-03-07 LAB — CBC WITH DIFFERENTIAL/PLATELET
BASO%: 0.2 % (ref 0.0–2.0)
Basophils Absolute: 0 10*3/uL (ref 0.0–0.1)
EOS ABS: 0 10*3/uL (ref 0.0–0.5)
EOS%: 0.3 % (ref 0.0–7.0)
HCT: 26 % — ABNORMAL LOW (ref 34.8–46.6)
HEMOGLOBIN: 8.6 g/dL — AB (ref 11.6–15.9)
LYMPH%: 6.1 % — AB (ref 14.0–49.7)
MCH: 28.8 pg (ref 25.1–34.0)
MCHC: 33 g/dL (ref 31.5–36.0)
MCV: 87.3 fL (ref 79.5–101.0)
MONO#: 0.8 10*3/uL (ref 0.1–0.9)
MONO%: 10.5 % (ref 0.0–14.0)
NEUT%: 82.9 % — ABNORMAL HIGH (ref 38.4–76.8)
NEUTROS ABS: 6.4 10*3/uL (ref 1.5–6.5)
Platelets: 143 10*3/uL — ABNORMAL LOW (ref 145–400)
RBC: 2.98 10*6/uL — ABNORMAL LOW (ref 3.70–5.45)
RDW: 14.9 % — AB (ref 11.2–14.5)
WBC: 7.7 10*3/uL (ref 3.9–10.3)
lymph#: 0.5 10*3/uL — ABNORMAL LOW (ref 0.9–3.3)

## 2017-03-07 LAB — BPAM RBC
BLOOD PRODUCT EXPIRATION DATE: 201805092359
ISSUE DATE / TIME: 201804181443
Unit Type and Rh: 5100

## 2017-03-07 LAB — COMPREHENSIVE METABOLIC PANEL
ALT: 14 U/L (ref 0–55)
AST: 16 U/L (ref 5–34)
Albumin: 3.1 g/dL — ABNORMAL LOW (ref 3.5–5.0)
Alkaline Phosphatase: 91 U/L (ref 40–150)
Anion Gap: 9 mEq/L (ref 3–11)
BUN: 12.6 mg/dL (ref 7.0–26.0)
CO2: 26 meq/L (ref 22–29)
Calcium: 8.1 mg/dL — ABNORMAL LOW (ref 8.4–10.4)
Chloride: 109 mEq/L (ref 98–109)
Creatinine: 1.4 mg/dL — ABNORMAL HIGH (ref 0.6–1.1)
EGFR: 53 mL/min/{1.73_m2} — AB (ref >90)
GLUCOSE: 142 mg/dL — AB (ref 70–140)
POTASSIUM: 4.2 meq/L (ref 3.5–5.1)
SODIUM: 143 meq/L (ref 136–145)
TOTAL PROTEIN: 6.9 g/dL (ref 6.4–8.3)

## 2017-03-07 LAB — TYPE AND SCREEN
ABO/RH(D): O POS
ANTIBODY SCREEN: NEGATIVE
UNIT DIVISION: 0

## 2017-03-07 MED ORDER — DOXORUBICIN HCL CHEMO IV INJECTION 2 MG/ML
60.0000 mg/m2 | Freq: Once | INTRAVENOUS | Status: AC
  Administered 2017-03-07: 128 mg via INTRAVENOUS
  Filled 2017-03-07: qty 64

## 2017-03-07 MED ORDER — HEPARIN SOD (PORK) LOCK FLUSH 100 UNIT/ML IV SOLN
500.0000 [IU] | Freq: Once | INTRAVENOUS | Status: AC | PRN
  Administered 2017-03-07: 500 [IU]
  Filled 2017-03-07: qty 5

## 2017-03-07 MED ORDER — SODIUM CHLORIDE 0.9% FLUSH
10.0000 mL | INTRAVENOUS | Status: DC | PRN
  Administered 2017-03-07: 10 mL
  Filled 2017-03-07: qty 10

## 2017-03-07 MED ORDER — SODIUM CHLORIDE 0.9 % IV SOLN
Freq: Once | INTRAVENOUS | Status: AC
  Administered 2017-03-07: 15:00:00 via INTRAVENOUS
  Filled 2017-03-07: qty 5

## 2017-03-07 MED ORDER — SODIUM CHLORIDE 0.9 % IV SOLN
600.0000 mg/m2 | Freq: Once | INTRAVENOUS | Status: AC
  Administered 2017-03-07: 1280 mg via INTRAVENOUS
  Filled 2017-03-07: qty 64

## 2017-03-07 MED ORDER — PALONOSETRON HCL INJECTION 0.25 MG/5ML
INTRAVENOUS | Status: AC
  Filled 2017-03-07: qty 5

## 2017-03-07 MED ORDER — SODIUM CHLORIDE 0.9 % IV SOLN
Freq: Once | INTRAVENOUS | Status: AC
  Administered 2017-03-07: 15:00:00 via INTRAVENOUS

## 2017-03-07 MED ORDER — PEGFILGRASTIM 6 MG/0.6ML ~~LOC~~ PSKT
6.0000 mg | PREFILLED_SYRINGE | Freq: Once | SUBCUTANEOUS | Status: AC
  Administered 2017-03-07: 6 mg via SUBCUTANEOUS
  Filled 2017-03-07: qty 0.6

## 2017-03-07 MED ORDER — PALONOSETRON HCL INJECTION 0.25 MG/5ML
0.2500 mg | Freq: Once | INTRAVENOUS | Status: AC
  Administered 2017-03-07: 0.25 mg via INTRAVENOUS

## 2017-03-07 NOTE — Patient Instructions (Signed)
[facility] Discharge Instructions for Patients Receiving Chemotherapy  Today you received the following chemotherapy agents:  Doxyrubin (adriamycin), Cytoxan (cyclophosphamide)  To help prevent nausea and vomiting after your treatment, we encourage you to take your nausea medication as prescribed.   If you develop nausea and vomiting that is not controlled by your nausea medication, call the clinic.   BELOW ARE SYMPTOMS THAT SHOULD BE REPORTED IMMEDIATELY:  *FEVER GREATER THAN 100.5 F  *CHILLS WITH OR WITHOUT FEVER  NAUSEA AND VOMITING THAT IS NOT CONTROLLED WITH YOUR NAUSEA MEDICATION  *UNUSUAL SHORTNESS OF BREATH  *UNUSUAL BRUISING OR BLEEDING  TENDERNESS IN MOUTH AND THROAT WITH OR WITHOUT PRESENCE OF ULCERS  *URINARY PROBLEMS  *BOWEL PROBLEMS  UNUSUAL RASH Items with * indicate a potential emergency and should be followed up as soon as possible.  Feel free to call the clinic you have any questions or concerns. The clinic phone number is [phone].  Please show the [place] at check-in to the Emergency Department and triage nurse.

## 2017-03-08 ENCOUNTER — Encounter: Payer: Self-pay | Admitting: Adult Health

## 2017-03-08 ENCOUNTER — Other Ambulatory Visit: Payer: Self-pay | Admitting: *Deleted

## 2017-03-08 ENCOUNTER — Ambulatory Visit: Payer: BLUE CROSS/BLUE SHIELD

## 2017-03-08 ENCOUNTER — Encounter: Payer: Self-pay | Admitting: Oncology

## 2017-03-08 DIAGNOSIS — E860 Dehydration: Secondary | ICD-10-CM

## 2017-03-08 NOTE — Progress Notes (Signed)
[facility]  [phone] [phone]     ID: Abigail Yoder DOB: 1966-04-09  MR#: [id number]  JJH#:[id number]  Patient Care Team: [name], MD as PCP - General (Internal Medicine) Alphonsa Overall, MD as Consulting Physician (General Surgery) Chauncey Cruel, MD as Consulting Physician (Oncology) Eppie Gibson, MD as Attending Physician (Radiation Oncology) Jacelyn Pi, MD as Consulting Physician (Endocrinology) Ardis Hughs, MD as Attending Physician (Urology) Scot Dock, NP OTHER MD:  CHIEF COMPLAINT: Estrogen receptor positive breast cancer  CURRENT TREATMENT: adjuvant chemotherapy   BREAST CANCER HISTORY: From the original intake note:  Datra had bilateral routine screening mammography with tomography at [facility] 11/14/2016. The breast density was category B. In the left breast upper inner quadrant there was a 1.5 cm high density mass which was further evaluated with ultrasonography 11/16/2016. This confirmed a 1.2 cm lobulated mass in the left breast upper inner quadrant. The left axilla was benign.  Biopsy of the left breast mass in question 11/21/2016 showed ([id number] [id number]) invasive ductal carcinoma, grade 3, estrogen and progesterone receptor positive, HER-2 nonamplified, with a signals ratio of 1.05 and the number per cell 2.15, with an MIB-1 of 90%.  Her subsequent history is as detailed below  INTERVAL HISTORY: Abigail Yoder is here today for cycle 4 day 1 of chemotherapy.  She is tired of having to come here so often.  She had elevated creatinine due to dehydration and was given 3 days of iv fluids.  She also received blood yesterday for a hemoglobin of 7.8 .  She tolerated this well and is feeling improved.    REVIEW OF SYSTEMS: A detailed ROS was conducted and is non contributory.    PAST MEDICAL HISTORY: Past Medical History:  Diagnosis Date  . Anemia   . Diabetes mellitus   . Family history of breast cancer   . Hypertension   .  Obesity     PAST SURGICAL HISTORY: Past Surgical History:  Procedure Laterality Date  . APPENDECTOMY    . BREAST LUMPECTOMY WITH RADIOACTIVE SEED AND SENTINEL LYMPH NODE BIOPSY Left 12/31/2016   Procedure: BREAST LUMPECTOMY WITH RADIOACTIVE SEED AND SENTINEL LYMPH NODE BIOPSY;  Surgeon: Alphonsa Overall, MD;  Location: [place];  Service: General;  Laterality: Left;  . CERVICAL SPINE SURGERY    . CESAREAN SECTION    . KNEE SURGERY Left   . PORTACATH PLACEMENT N/A 12/31/2016   Procedure: INSERTION PORT-A-CATH WITH [place];  Surgeon: Alphonsa Overall, MD;  Location: [place];  Service: General;  Laterality: N/A;  . TUBAL LIGATION      FAMILY HISTORY Family History  Problem Relation Age of Onset  . Diabetes Mother   . Arthritis Mother   . Hypertension Father   . Heart disease Father   . Hyperlipidemia Father   . Breast cancer Sister 79    came back 17 years later  . Stroke Maternal Uncle   . Stroke Maternal Grandmother   The patient's father died at the age of 9 from a brain aneurysm. The patient's mother died at age 32 following total hip replacement. The patient had 4 brothers, 5 sisters. One sister was diagnosed with breast cancer at age 24. There is no history of ovarian cancer in the family.  GYNECOLOGIC HISTORY:  No LMP recorded. Patient is not currently having periods (Reason: Perimenopausal). Menarche age 17, first live birth age 62, the patient is [place] P2. She is perimenopausal and her most recent period was  October 2017. She never used oral contraceptives.  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 Her husband [name] works in a Proofreader. Daughter [name] works for the [employer] in [place]. Son show more right works for the Glass blower/designer at [employer] in [place]. The patient has 2 grandchildren. She attends a local [employer]: Not in place   HEALTH MAINTENANCE: Social History  Substance Use Topics  . Smoking status: Former Smoker    Quit date: 11/19/1994  . Smokeless tobacco: Never Used  . Alcohol use No     Colonoscopy:Never  PAP:2015  Bone density:Never   Allergies  Allergen Reactions  . Pineapple Anaphylaxis and Hives    Current Outpatient Prescriptions  Medication Sig Dispense Refill  . amLODipine (NORVASC) 5 MG tablet Take 5 mg by mouth daily.    . insulin aspart protamine- aspart (NOVOLOG MIX 70/30) (70-30) 100 UNIT/ML injection Inject 20 Units into the skin 2 (two) times daily with a meal.    . lidocaine-prilocaine (EMLA) cream Apply to affected area once 30 g 3  . LORazepam (ATIVAN) 0.5 MG tablet Take 1 tablet (0.5 mg total) by mouth every 6 (six) hours as needed (Nausea or vomiting). 30 tablet 0  . prochlorperazine (COMPAZINE) 10 MG tablet Take 1 tablet (10 mg total) by mouth every 6 (six) hours as needed (Nausea or vomiting). 30 tablet 1  . saccharomyces boulardii (FLORASTOR) 250 MG capsule Take 1 capsule (250 mg total) by mouth 2 (two) times daily. 60 capsule 0   No current facility-administered medications for this visit.     OBJECTIVE:  Vitals:   03/07/17 1403  BP: (!) 154/79  Pulse: 78  Resp: 18  Temp: 98.3 F (36.8 C)     Body mass index is 37.29 kg/m.    ECOG FS:1 - Symptomatic but completely ambulatory  GENERAL: Patient is a well appearing female in no acute distress HEENT:  Sclerae anicteric. PERRL. Oropharynx clear and moist. No ulcerations or evidence of oropharyngeal candidiasis. Neck is supple.  NODES:  No cervical, supraclavicular, or axillary lymphadenopathy palpated.  BREAST EXAM:  Deferred. LUNGS:  Clear to auscultation bilaterally.  No wheezes or rhonchi. HEART:  Regular rate and rhythm. No murmur appreciated. ABDOMEN:  Soft, nontender.  Positive, normoactive bowel sounds. No organomegaly palpated. MSK:  No focal spinal tenderness  to palpation. Full range of motion bilaterally in the upper extremities. EXTREMITIES:  No peripheral edema.   SKIN:  Clear with no obvious rashes or skin changes. No nail dyscrasia. NEURO:  Nonfocal. Well oriented.  Appropriate affect.     LAB RESULTS:  CMP     Component Value Date/Time   NA 143 03/07/2017 1336   K 4.2 03/07/2017 1336   CL 109 02/14/2017 0453   CO2 26 03/07/2017 1336   GLUCOSE 142 (H) 03/07/2017 1336   GLUCOSE 333 (H) 09/05/2006 1306   BUN 12.6 03/07/2017 1336   CREATININE 1.4 (H) 03/07/2017 1336   CALCIUM 8.1 (L) 03/07/2017 1336   PROT 6.9 03/07/2017 1336   ALBUMIN 3.1 (L) 03/07/2017 1336   AST 16 03/07/2017 1336   ALT 14 03/07/2017 1336   ALKPHOS 91 03/07/2017 1336   BILITOT <0.22 03/07/2017 1336   GFRNONAA 60 (L) 02/14/2017 0453   GFRAA >60 02/14/2017 0453    INo results found for: SPEP, UPEP  Lab Results  Component Value Date  WBC 7.7 03/07/2017   NEUTROABS 6.4 03/07/2017   HGB 8.6 (L) 03/07/2017   HCT 26.0 (L) 03/07/2017   MCV 87.3 03/07/2017   PLT 143 (L) 03/07/2017      Chemistry      Component Value Date/Time   NA 143 03/07/2017 1336   K 4.2 03/07/2017 1336   CL 109 02/14/2017 0453   CO2 26 03/07/2017 1336   BUN 12.6 03/07/2017 1336   CREATININE 1.4 (H) 03/07/2017 1336      Component Value Date/Time   CALCIUM 8.1 (L) 03/07/2017 1336   ALKPHOS 91 03/07/2017 1336   AST 16 03/07/2017 1336   ALT 14 03/07/2017 1336   BILITOT <0.22 03/07/2017 1336       No results found for: LABCA2  No components found for: PVVZS827  No results for input(s): INR in the last 168 hours.  Urinalysis    Component Value Date/Time   COLORURINE YELLOW 02/09/2017 1300   APPEARANCEUR HAZY (A) 02/09/2017 1300   LABSPEC 1.013 02/09/2017 1300   LABSPEC 1.010 02/07/2017 1005   PHURINE 5.0 02/09/2017 1300   GLUCOSEU NEGATIVE 02/09/2017 1300   GLUCOSEU Negative 02/07/2017 1005   HGBUR NEGATIVE 02/09/2017 1300   BILIRUBINUR NEGATIVE 02/09/2017 1300     BILIRUBINUR Negative 02/07/2017 1005   KETONESUR NEGATIVE 02/09/2017 1300   PROTEINUR 100 (A) 02/09/2017 1300   UROBILINOGEN 0.2 02/07/2017 1005   NITRITE NEGATIVE 02/09/2017 1300   LEUKOCYTESUR NEGATIVE 02/09/2017 1300   LEUKOCYTESUR Negative 02/07/2017 1005     STUDIES: Dg Chest 2 View  Result Date: 02/09/2017 CLINICAL DATA:  Patient with fever.  History of breast cancer. EXAM: CHEST  2 VIEW COMPARISON:  Chest radiograph 02/07/2017 FINDINGS: Right anterior chest wall Port-A-Cath is present tip projecting over the superior vena cava. Anterior cervical spinal fusion hardware. Normal cardiac and mediastinal contours. No consolidative pulmonary opacities. No pleural effusion or pneumothorax. Regional skeleton is unremarkable. IMPRESSION: No active cardiopulmonary disease. Electronically Signed   By: Lovey Newcomer M.D.   On: 02/09/2017 10:19   Dg Chest 2 View  Result Date: 02/07/2017 CLINICAL DATA:  Neutropenic fever EXAM: CHEST  2 VIEW COMPARISON:  12/31/2016 FINDINGS: Lungs are clear without infiltrate effusion or mass. Negative for pneumonia. Heart size normal. Port-A-Cath tip in the lower SVC unchanged in position. IMPRESSION: No active cardiopulmonary disease. Electronically Signed   By: Franchot Gallo M.D.   On: 02/07/2017 14:16    ELIGIBLE FOR AVAILABLE RESEARCH PROTOCOL: No   ASSESSMENT: 51 y.o. [place] woman status post left breast upper inner quadrant biopsy 11/21/2016 for a clinical T1 cN0, stage IA invasive ductal carcinoma, grade 3, estrogen and progesterone receptor positive, HER-2 not amplified, with an MIB-1 of 90%  (1) Oncotype DX Score of 60 predicts a risk of recurrence outside the breast of 34% if the patient's only systemic therapy is tamoxifen for 5 years. On a similar database this risk drops to about 12% if chemotherapy is also given  (2) genetics testing sent 11/28/2016; The Breast/GYN gene panel offered by [facility] includes sequencing and rearrangement analysis for  the following 23 genes:  ATM, BARD1, BRCA1, BRCA2, BRIP1, CDH1, CHEK2, EPCAM, FANCC, MLH1, MSH2, MSH6, MUTYH, NBN, NF1, PALB2, PMS2, POLD1, PTEN, RAD51C, RAD51D, RECQL, and TP53.   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    (3) status post left breast lumpectomy on 12/31/2016: invasive ductal carcinoma, 1.8cm, grade 3, margins negative, 1  SLN negative, ER+(80%), PR-(2%), Ki-67 90%, HER-2 negative (ratio 1.05).  T1cN0  (4) chemotherapy to consist of cyclophosphamide and doxorubicin in dose dense fashion 4 (started 01/17/17), followed by Abraxane weekly 12.   (5) adjuvant radiation to follow as appropriate.  PLAN:  Merlin is doing well today.  She will proceed with chemotherapy.  She will receive Onpro with this cycle.  Her CBC is stable.  She will return in one week for labs and evaluation with Dr. Jana Hakim.  Will f/u with her regarding her kidney function today.   A total of (20) minutes of face-to-face time was spent with this patient with greater than 50% of that time in counseling and care-coordination.    Scot Dock, NP   03/08/2017 12:58 PM Medical Oncology and Hematology [facility] [address] [place], [place] [address] Tel. [phone]    Fax. [phone]

## 2017-03-08 NOTE — Progress Notes (Signed)
Returned patient's call from a voicemail she left requesting financial assistance. Introduced myself as Arboriculturist and asked how I may help her. Patient requesting assistance with transportation cost in forms of gas cards due to frequent visits and causing a burden. Advised patient she may apply for the one-time $1000 [employer] grant and advised proof of household income is needed for her and spouse. Explained the expenses that the grant would also help cover other than transportation. Patient states she does have to pay for medications out of pocket as well. Asked patient if she could bring information at her next visit and meet with me after she sees the doctor. She states she will try to have it all together by then. Patient has my contact name and number for any additional financial questions or concerns.

## 2017-03-11 ENCOUNTER — Telehealth: Payer: Self-pay | Admitting: *Deleted

## 2017-03-11 ENCOUNTER — Telehealth: Payer: Self-pay

## 2017-03-11 ENCOUNTER — Telehealth: Payer: Self-pay | Admitting: Oncology

## 2017-03-11 NOTE — Telephone Encounter (Signed)
Pt called earlier asking why she has infusion appts this week. Clarified with Mendel Ryder and called pt back. She is getting ivf after AC chemo to help keep her kidney function optimal. Directions for sickle cell clinic given. Pt appreciative of call back.

## 2017-03-11 NOTE — Telephone Encounter (Signed)
"  I received a call and MyChart appointment message for IVF at [facility] Wednesday and Thursday of this week at 8:00 am.  I am scheduled for Dr. Jana Hakim Thursday at 12:00 pm.  I'm confused and do not know why I have these appointments.  I'll be there but am curious why these appointments are scheduled.  [phone]."    "Saturday, I started having diarrhea but no nausea or vomiting."

## 2017-03-11 NOTE — Telephone Encounter (Signed)
sw pt to confirm added ivf appts to April schedule per LOS

## 2017-03-12 ENCOUNTER — Ambulatory Visit (HOSPITAL_COMMUNITY): Payer: BLUE CROSS/BLUE SHIELD

## 2017-03-13 ENCOUNTER — Inpatient Hospital Stay (HOSPITAL_COMMUNITY)
Admission: EM | Admit: 2017-03-13 | Discharge: 2017-03-18 | DRG: 871 | Disposition: A | Discharge status: Home / Self Care | Payer: BLUE CROSS/BLUE SHIELD | Attending: Internal Medicine | Admitting: Internal Medicine

## 2017-03-13 ENCOUNTER — Encounter (HOSPITAL_COMMUNITY): Payer: Self-pay | Admitting: Emergency Medicine

## 2017-03-13 ENCOUNTER — Ambulatory Visit (HOSPITAL_COMMUNITY)
Admission: RE | Admit: 2017-03-13 | Discharge: 2017-03-13 | Disposition: A | Discharge status: Home / Self Care | Payer: BLUE CROSS/BLUE SHIELD | Source: Ambulatory Visit | Attending: Oncology | Admitting: Oncology

## 2017-03-13 ENCOUNTER — Emergency Department (HOSPITAL_COMMUNITY): Payer: BLUE CROSS/BLUE SHIELD

## 2017-03-13 DIAGNOSIS — Z87891 Personal history of nicotine dependence: Secondary | ICD-10-CM

## 2017-03-13 DIAGNOSIS — K1230 Oral mucositis (ulcerative), unspecified: Secondary | ICD-10-CM | POA: Diagnosis not present

## 2017-03-13 DIAGNOSIS — T451X5A Adverse effect of antineoplastic and immunosuppressive drugs, initial encounter: Secondary | ICD-10-CM | POA: Diagnosis present

## 2017-03-13 DIAGNOSIS — D709 Neutropenia, unspecified: Secondary | ICD-10-CM | POA: Diagnosis present

## 2017-03-13 DIAGNOSIS — C50212 Malignant neoplasm of upper-inner quadrant of left female breast: Secondary | ICD-10-CM | POA: Diagnosis present

## 2017-03-13 DIAGNOSIS — Z6837 Body mass index (BMI) 37.0-37.9, adult: Secondary | ICD-10-CM | POA: Diagnosis not present

## 2017-03-13 DIAGNOSIS — Z9851 Tubal ligation status: Secondary | ICD-10-CM | POA: Diagnosis not present

## 2017-03-13 DIAGNOSIS — F419 Anxiety disorder, unspecified: Secondary | ICD-10-CM | POA: Diagnosis present

## 2017-03-13 DIAGNOSIS — R5081 Fever presenting with conditions classified elsewhere: Secondary | ICD-10-CM | POA: Diagnosis present

## 2017-03-13 DIAGNOSIS — Z79899 Other long term (current) drug therapy: Secondary | ICD-10-CM

## 2017-03-13 DIAGNOSIS — D649 Anemia, unspecified: Secondary | ICD-10-CM | POA: Diagnosis present

## 2017-03-13 DIAGNOSIS — Z170 Estrogen receptor positive status [ER+]: Secondary | ICD-10-CM | POA: Diagnosis not present

## 2017-03-13 DIAGNOSIS — I10 Essential (primary) hypertension: Secondary | ICD-10-CM | POA: Diagnosis present

## 2017-03-13 DIAGNOSIS — E860 Dehydration: Secondary | ICD-10-CM

## 2017-03-13 DIAGNOSIS — E119 Type 2 diabetes mellitus without complications: Secondary | ICD-10-CM | POA: Diagnosis present

## 2017-03-13 DIAGNOSIS — E669 Obesity, unspecified: Secondary | ICD-10-CM | POA: Diagnosis present

## 2017-03-13 DIAGNOSIS — D61810 Antineoplastic chemotherapy induced pancytopenia: Secondary | ICD-10-CM | POA: Diagnosis present

## 2017-03-13 DIAGNOSIS — Z794 Long term (current) use of insulin: Secondary | ICD-10-CM

## 2017-03-13 DIAGNOSIS — R5383 Other fatigue: Secondary | ICD-10-CM | POA: Diagnosis not present

## 2017-03-13 DIAGNOSIS — A419 Sepsis, unspecified organism: Principal | ICD-10-CM | POA: Diagnosis present

## 2017-03-13 DIAGNOSIS — J029 Acute pharyngitis, unspecified: Secondary | ICD-10-CM | POA: Diagnosis not present

## 2017-03-13 DIAGNOSIS — D571 Sickle-cell disease without crisis: Secondary | ICD-10-CM | POA: Diagnosis present

## 2017-03-13 DIAGNOSIS — Z91018 Allergy to other foods: Secondary | ICD-10-CM

## 2017-03-13 LAB — CBC WITH DIFFERENTIAL/PLATELET
BASOS PCT: 7 %
Basophils Absolute: 0 10*3/uL (ref 0.0–0.1)
EOS PCT: 0 %
Eosinophils Absolute: 0 10*3/uL (ref 0.0–0.7)
HEMATOCRIT: 20.4 % — AB (ref 36.0–46.0)
Hemoglobin: 7 g/dL — ABNORMAL LOW (ref 12.0–15.0)
LYMPHS ABS: 0.2 10*3/uL — AB (ref 0.7–4.0)
Lymphocytes Relative: 79 %
MCH: 29.3 pg (ref 26.0–34.0)
MCHC: 34.3 g/dL (ref 30.0–36.0)
MCV: 85.4 fL (ref 78.0–100.0)
Monocytes Absolute: 0 10*3/uL — ABNORMAL LOW (ref 0.1–1.0)
Monocytes Relative: 7 %
NEUTROS ABS: 0 10*3/uL — AB (ref 1.7–7.7)
Neutrophils Relative %: 7 %
PLATELETS: 89 10*3/uL — AB (ref 150–400)
RBC: 2.39 MIL/uL — AB (ref 3.87–5.11)
RDW: 14.8 % (ref 11.5–15.5)
WBC: 0.2 10*3/uL — AB (ref 4.0–10.5)

## 2017-03-13 LAB — COMPREHENSIVE METABOLIC PANEL
ALT: 17 U/L (ref 14–54)
AST: 17 U/L (ref 15–41)
Albumin: 2.7 g/dL — ABNORMAL LOW (ref 3.5–5.0)
Alkaline Phosphatase: 69 U/L (ref 38–126)
Anion gap: 7 (ref 5–15)
BUN: 19 mg/dL (ref 6–20)
CO2: 27 mmol/L (ref 22–32)
Calcium: 7.7 mg/dL — ABNORMAL LOW (ref 8.9–10.3)
Chloride: 104 mmol/L (ref 101–111)
Creatinine, Ser: 1.03 mg/dL — ABNORMAL HIGH (ref 0.44–1.00)
GFR calc Af Amer: >60 mL/min (ref >60)
GFR calc non Af Amer: >60 mL/min (ref >60)
Glucose, Bld: 135 mg/dL — ABNORMAL HIGH (ref 65–99)
Potassium: 4.1 mmol/L (ref 3.5–5.1)
Sodium: 138 mmol/L (ref 135–145)
Total Bilirubin: 0.2 mg/dL — ABNORMAL LOW (ref 0.3–1.2)
Total Protein: 6 g/dL — ABNORMAL LOW (ref 6.5–8.1)

## 2017-03-13 LAB — URINALYSIS, ROUTINE W REFLEX MICROSCOPIC
Bilirubin Urine: NEGATIVE
Glucose, UA: NEGATIVE mg/dL
Hgb urine dipstick: NEGATIVE
Ketones, ur: NEGATIVE mg/dL
Leukocytes, UA: NEGATIVE
Nitrite: NEGATIVE
Protein, ur: 100 mg/dL — AB
Specific Gravity, Urine: 1.011 (ref 1.005–1.030)
pH: 7 (ref 5.0–8.0)

## 2017-03-13 LAB — I-STAT CG4 LACTIC ACID, ED: Lactic Acid, Venous: 0.85 mmol/L (ref 0.5–1.9)

## 2017-03-13 MED ORDER — HEPARIN SOD (PORK) LOCK FLUSH 100 UNIT/ML IV SOLN
500.0000 [IU] | INTRAVENOUS | Status: AC | PRN
  Administered 2017-03-13: 500 [IU]

## 2017-03-13 MED ORDER — OSELTAMIVIR PHOSPHATE 75 MG PO CAPS: 75.0000 mg | ORAL_CAPSULE | Freq: Once | ORAL | Status: DC

## 2017-03-13 MED ORDER — ZOLPIDEM TARTRATE 5 MG PO TABS
5.0000 mg | ORAL_TABLET | Freq: Every evening | ORAL | Status: DC | PRN
  Administered 2017-03-17: 5 mg via ORAL
  Filled 2017-03-13: qty 1

## 2017-03-13 MED ORDER — SODIUM CHLORIDE 0.9% FLUSH
3.0000 mL | Freq: Two times a day (BID) | INTRAVENOUS | Status: DC
  Administered 2017-03-14: 3 mL via INTRAVENOUS

## 2017-03-13 MED ORDER — HEPARIN SOD (PORK) LOCK FLUSH 100 UNIT/ML IV SOLN
250.0000 [IU] | INTRAVENOUS | Status: DC | PRN
  Filled 2017-03-13: qty 5

## 2017-03-13 MED ORDER — SODIUM CHLORIDE 0.9 % IV SOLN
INTRAVENOUS | Status: DC
  Administered 2017-03-13: 08:00:00 via INTRAVENOUS

## 2017-03-13 MED ORDER — LORAZEPAM 0.5 MG PO TABS: 0.5000 mg | ORAL_TABLET | Freq: Four times a day (QID) | ORAL | Status: DC | PRN

## 2017-03-13 MED ORDER — PROCHLORPERAZINE MALEATE 10 MG PO TABS: 10.0000 mg | ORAL_TABLET | Freq: Four times a day (QID) | ORAL | Status: DC | PRN

## 2017-03-13 MED ORDER — VANCOMYCIN HCL IN DEXTROSE 1-5 GM/200ML-% IV SOLN
1000.0000 mg | Freq: Once | INTRAVENOUS | Status: AC
  Administered 2017-03-13: 1000 mg via INTRAVENOUS
  Filled 2017-03-13: qty 200

## 2017-03-13 MED ORDER — SODIUM CHLORIDE 0.9 % IV BOLUS (SEPSIS): 1000.0000 mL | Freq: Once | INTRAVENOUS | Status: DC

## 2017-03-13 MED ORDER — INSULIN ASPART 100 UNIT/ML ~~LOC~~ SOLN: 0.0000 [IU] | Freq: Three times a day (TID) | SUBCUTANEOUS | Status: DC

## 2017-03-13 MED ORDER — PIPERACILLIN-TAZOBACTAM 3.375 G IVPB 30 MIN
3.3750 g | Freq: Once | INTRAVENOUS | Status: AC
  Administered 2017-03-13: 3.375 g via INTRAVENOUS
  Filled 2017-03-13: qty 50

## 2017-03-13 MED ORDER — LIDOCAINE-PRILOCAINE 2.5-2.5 % EX CREA
TOPICAL_CREAM | CUTANEOUS | Status: DC | PRN
  Filled 2017-03-13: qty 5

## 2017-03-13 MED ORDER — ACETAMINOPHEN 325 MG PO TABS
650.0000 mg | ORAL_TABLET | Freq: Four times a day (QID) | ORAL | Status: DC | PRN
  Administered 2017-03-14 – 2017-03-16 (×8): 650 mg via ORAL
  Filled 2017-03-13 (×10): qty 2

## 2017-03-13 MED ORDER — AMLODIPINE BESYLATE 5 MG PO TABS
5.0000 mg | ORAL_TABLET | Freq: Every day | ORAL | Status: DC
  Administered 2017-03-14 – 2017-03-18 (×4): 5 mg via ORAL
  Filled 2017-03-13 (×5): qty 1

## 2017-03-13 MED ORDER — SODIUM CHLORIDE 0.9 % IV SOLN
INTRAVENOUS | Status: DC
  Administered 2017-03-14 – 2017-03-18 (×5): via INTRAVENOUS

## 2017-03-13 MED ORDER — ONDANSETRON HCL 4 MG PO TABS: 4.0000 mg | ORAL_TABLET | Freq: Four times a day (QID) | ORAL | Status: DC | PRN

## 2017-03-13 MED ORDER — SACCHAROMYCES BOULARDII 250 MG PO CAPS
250.0000 mg | ORAL_CAPSULE | Freq: Two times a day (BID) | ORAL | Status: DC
  Administered 2017-03-14 – 2017-03-18 (×8): 250 mg via ORAL
  Filled 2017-03-13 (×9): qty 1

## 2017-03-13 MED ORDER — ONDANSETRON HCL 4 MG/2ML IJ SOLN
4.0000 mg | Freq: Four times a day (QID) | INTRAMUSCULAR | Status: DC | PRN
  Administered 2017-03-16: 4 mg via INTRAVENOUS
  Filled 2017-03-13: qty 2

## 2017-03-13 MED ORDER — SODIUM CHLORIDE 0.9% FLUSH
10.0000 mL | INTRAVENOUS | Status: AC | PRN
  Administered 2017-03-13: 10 mL

## 2017-03-13 MED ORDER — SODIUM CHLORIDE 0.9 % IV BOLUS (SEPSIS)
1000.0000 mL | Freq: Once | INTRAVENOUS | Status: AC
  Administered 2017-03-13: 1000 mL via INTRAVENOUS

## 2017-03-13 MED ORDER — DM-GUAIFENESIN ER 30-600 MG PO TB12: 1.0000 | ORAL_TABLET | Freq: Two times a day (BID) | ORAL | Status: DC | PRN

## 2017-03-13 MED ORDER — INSULIN ASPART PROT & ASPART (70-30 MIX) 100 UNIT/ML ~~LOC~~ SUSP
15.0000 [IU] | Freq: Two times a day (BID) | SUBCUTANEOUS | Status: DC
Start: 2017-03-14 — End: 2017-03-18
  Administered 2017-03-14 – 2017-03-18 (×3): 15 [IU] via SUBCUTANEOUS
  Filled 2017-03-13: qty 10

## 2017-03-13 MED ORDER — ACETAMINOPHEN 325 MG PO TABS
650.0000 mg | ORAL_TABLET | Freq: Once | ORAL | Status: AC | PRN
  Administered 2017-03-13: 650 mg via ORAL
  Filled 2017-03-13: qty 2

## 2017-03-13 MED ORDER — SODIUM CHLORIDE 0.9% FLUSH: 3.0000 mL | INTRAVENOUS | Status: DC | PRN

## 2017-03-13 MED ORDER — ACETAMINOPHEN 650 MG RE SUPP
650.0000 mg | Freq: Four times a day (QID) | RECTAL | Status: DC | PRN
  Administered 2017-03-17: 650 mg via RECTAL
  Filled 2017-03-13: qty 1

## 2017-03-13 NOTE — ED Notes (Signed)
Bed: [facility]PT1 Expected date:  Expected time:  Means of arrival:  Comments: 

## 2017-03-13 NOTE — ED Provider Notes (Addendum)
[name] DEPT Provider Note   CSN: [id number] Arrival date & time: 03/13/17  1910     History   Chief Complaint Chief Complaint  Patient presents with  . Fever  . Cancer patient    HPI Abigail Yoder is a 51 y.o. female.  The history is provided by the patient and medical records. No language interpreter was used.  Fever   This is a new problem. The current episode started 1 to 2 hours ago. The problem occurs constantly. The problem has not changed since onset.The maximum temperature noted was 101 to 101.9 F. The temperature was taken using an oral thermometer. Associated symptoms include cough. Pertinent negatives include no chest pain, no fussiness, no vomiting, no congestion and no headaches. She has tried nothing for the symptoms. The treatment provided no relief.    Past Medical History:  Diagnosis Date  . Anemia   . Diabetes mellitus   . Family history of breast cancer   . Hypertension   . Obesity     Patient Active Problem List   Diagnosis Date Noted  . Antineoplastic chemotherapy induced pancytopenia ([place])   . Hypomagnesemia 02/08/2017  . Trichomonas infection   . Neutropenic fever ([place]) 02/07/2017  . Genetic testing 12/18/2016  . Family history of breast cancer   . Malignant neoplasm of upper-inner quadrant of left breast in female, estrogen receptor positive ([place]) 11/27/2016  . 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 Obesity (BMI 30-39.9) 05/17/2011  . Essential hypertension 08/01/2007  . Type 2 diabetes mellitus ([place]) 06/30/2007  . HYPOGLYCEMIA NOS 06/30/2007  . Iron deficiency anemia 06/30/2007  . HEADACHE 06/30/2007    Past Surgical History:  Procedure Laterality Date  . APPENDECTOMY    . BREAST LUMPECTOMY WITH RADIOACTIVE SEED AND SENTINEL LYMPH NODE BIOPSY Left 12/31/2016   Procedure: BREAST LUMPECTOMY WITH RADIOACTIVE SEED AND SENTINEL LYMPH NODE BIOPSY;  Surgeon: Alphonsa Overall, MD;  Location: [facility];  Service: General;  Laterality: Left;  . CERVICAL SPINE SURGERY    . CESAREAN SECTION    . KNEE SURGERY Left   . PORTACATH PLACEMENT N/A 12/31/2016   Procedure: INSERTION PORT-A-CATH WITH [place];  Surgeon: Alphonsa Overall, MD;  Location: [facility];  Service: General;  Laterality: N/A;  . TUBAL LIGATION      OB History    No data available       Home Medications    Prior to Admission medications   Medication Sig Start Date End Date Taking? Authorizing Provider  amLODipine (NORVASC) 5 MG tablet Take 5 mg by mouth daily.    Historical Provider, MD  insulin aspart protamine- aspart (NOVOLOG MIX 70/30) (70-30) 100 UNIT/ML injection Inject 20 Units into the skin 2 (two) times daily with a meal.    Historical Provider, MD  lidocaine-prilocaine (EMLA) cream Apply to affected area once 12/14/16   Chauncey Cruel, MD  LORazepam (ATIVAN) 0.5 MG tablet Take 1 tablet (0.5 mg total) by mouth every 6 (six) hours as needed (Nausea or vomiting). 12/14/16   Chauncey Cruel, MD  prochlorperazine (COMPAZINE) 10 MG tablet Take 1 tablet (10 mg total) by mouth every 6 (six) hours as needed (Nausea or vomiting). 12/14/16   Chauncey Cruel, MD  saccharomyces boulardii (FLORASTOR) 250 MG capsule Take 1 capsule (250 mg total) by mouth 2 (two) times daily. 02/14/17 03/16/17  Ankit Arsenio Loader, MD    Family History Family History  Problem Relation Age of  Onset  . Diabetes Mother   . Arthritis Mother   . Hypertension Father   . Heart disease Father   . Hyperlipidemia Father   . Breast cancer Sister 74    came back 17 years later  . Stroke Maternal Uncle   . Stroke Maternal Grandmother     Social History Social History  Substance Use Topics  . Smoking status: Former Smoker    Quit date: 11/19/1994  . Smokeless tobacco: Never Used  . Alcohol use No     Allergies   Pineapple   Review of Systems Review of Systems  Constitutional: Positive for chills, fatigue and fever.  Negative for diaphoresis.  HENT: Negative for congestion and rhinorrhea.   Respiratory: Positive for cough. Negative for chest tightness, shortness of breath, wheezing and stridor.   Cardiovascular: Negative for chest pain, palpitations and leg swelling.  Gastrointestinal: Negative for nausea and vomiting.  Genitourinary: Negative for flank pain and frequency.  Musculoskeletal: Negative for neck pain.  Skin: Negative for pallor and wound.  Neurological: Negative for weakness, numbness and headaches.  Psychiatric/Behavioral: Negative for agitation.  All other systems reviewed and are negative.    Physical Exam Updated Vital Signs BP (!) 168/78 (BP Location: Right Arm)   Pulse (!) 111   Temp (!) 100.4 F (38 C) (Oral)   Resp 16   Ht 5' 3.5" (1.613 m)   Wt 217 lb (98.4 kg)   SpO2 100%   BMI 37.84 kg/m   Physical Exam  Constitutional: She appears well-developed and well-nourished. No distress.  HENT:  Head: Normocephalic and atraumatic.  Mouth/Throat: Oropharynx is clear and moist. No oropharyngeal exudate.  Eyes: Conjunctivae are normal.  Neck: Neck supple.  Cardiovascular: Normal rate, regular rhythm and intact distal pulses.   No murmur heard. Pulmonary/Chest: Effort normal and breath sounds normal. No respiratory distress. She has no wheezes. She has no rales. She exhibits no tenderness.  Abdominal: Soft. There is no tenderness.  Musculoskeletal: She exhibits no edema or tenderness.  Neurological: She is alert. No sensory deficit. She exhibits normal muscle tone.  Skin: Skin is warm and dry. Capillary refill takes less than 2 seconds. No rash noted. She is not diaphoretic.  Psychiatric: She has a normal mood and affect.  Nursing note and vitals reviewed.    ED Treatments / Results  Labs (all labs ordered are listed, but only abnormal results are displayed) Labs Reviewed  COMPREHENSIVE METABOLIC PANEL - Abnormal; Notable for the following:       Result Value    Glucose, Bld 135 (*)    Creatinine, Ser 1.03 (*)    Calcium 7.7 (*)    Total Protein 6.0 (*)    Albumin 2.7 (*)    Total Bilirubin 0.2 (*)    All other components within normal limits  CBC WITH DIFFERENTIAL/PLATELET - Abnormal; Notable for the following:    WBC 0.2 (*)    RBC 2.39 (*)    Hemoglobin 7.0 (*)    HCT 20.4 (*)    Platelets 89 (*)    Neutro Abs 0.0 (*)    Lymphs Abs 0.2 (*)    Monocytes Absolute 0.0 (*)    All other components within normal limits  URINALYSIS, ROUTINE W REFLEX MICROSCOPIC - Abnormal; Notable for the following:    Color, Urine STRAW (*)    Protein, ur 100 (*)    Bacteria, UA RARE (*)    Squamous Epithelial / LPF 0-5 (*)    All other  components within normal limits  BASIC METABOLIC PANEL - Abnormal; Notable for the following:    Glucose, Bld 175 (*)    Creatinine, Ser 1.04 (*)    Calcium 7.2 (*)    All other components within normal limits  CBC - Abnormal; Notable for the following:    WBC 0.1 (*)    RBC 2.10 (*)    Hemoglobin 6.0 (*)    HCT 17.5 (*)    Platelets 60 (*)    All other components within normal limits  GLUCOSE, CAPILLARY - Abnormal; Notable for the following:    Glucose-Capillary 137 (*)    All other components within normal limits  CULTURE, BLOOD (ROUTINE X 2)  CULTURE, BLOOD (ROUTINE X 2)  URINE CULTURE  INFLUENZA PANEL BY PCR (TYPE A & B)  BRAIN NATRIURETIC PEPTIDE  LACTIC ACID, PLASMA  LACTIC ACID, PLASMA  PROCALCITONIN  PROTIME-INR  LACTATE DEHYDROGENASE  SAVE SMEAR  GLUCOSE, CAPILLARY  PATHOLOGIST SMEAR REVIEW  HAPTOGLOBIN  I-STAT CG4 LACTIC ACID, ED  TYPE AND SCREEN  PREPARE RBC (CROSSMATCH)    EKG  EKG Interpretation None     EKG not imported into system.   Radiology Dg Chest 2 View  Result Date: 03/13/2017 CLINICAL DATA:  Weakness.  History of breast cancer. EXAM: CHEST  2 VIEW COMPARISON:  02/09/2017 FINDINGS: The heart size and mediastinal contours are within normal limits. Port catheter tip is seen  in the mid right atrium. Both lungs are clear. The visualized skeletal structures are unremarkable. The patient is status post ACDF of the lower cervical spine. IMPRESSION: No active cardiopulmonary disease. Port catheter tip in the mid right atrium on current exam, previously in the distal SVC -RA juncture. Electronically Signed   By: Ashley Royalty M.D.   On: 03/13/2017 20:39    Procedures Procedures (including critical care time)  CRITICAL CARE Performed by: Gwenyth Allegra Ahmya Bernick Total critical care time: 35 minutes Critical care time was exclusive of separately billable procedures and treating other patients. Critical care was necessary to treat or prevent imminent or life-threatening deterioration. Critical care was time spent personally by me on the following activities: development of treatment plan with patient and/or surrogate as well as nursing, discussions with consultants, evaluation of patient's response to treatment, examination of patient, obtaining history from patient or surrogate, ordering and performing treatments and interventions, ordering and review of laboratory studies, ordering and review of radiographic studies, pulse oximetry and re-evaluation of patient's condition.   Medications Ordered in ED Medications  oseltamivir (TAMIFLU) capsule 75 mg (not administered)  insulin aspart protamine- aspart (NOVOLOG MIX 70/30) injection 15 Units (15 Units Subcutaneous Given 03/14/17 0855)  saccharomyces boulardii (FLORASTOR) capsule 250 mg (250 mg Oral Given 03/14/17 0855)  lidocaine-prilocaine (EMLA) cream (not administered)  LORazepam (ATIVAN) tablet 0.5 mg (not administered)  prochlorperazine (COMPAZINE) tablet 10 mg (not administered)  amLODipine (NORVASC) tablet 5 mg (5 mg Oral Given 03/14/17 0855)  0.9 %  sodium chloride infusion ( Intravenous New Bag/Given 03/14/17 0102)  dextromethorphan-guaiFENesin (MUCINEX DM) 30-600 MG per 12 hr tablet 1 tablet (not administered)  insulin  aspart (novoLOG) injection 0-9 Units (0 Units Subcutaneous Not Given 03/14/17 0854)  sodium chloride flush (NS) 0.9 % injection 3 mL (3 mLs Intravenous Not Given 03/14/17 1000)  acetaminophen (TYLENOL) tablet 650 mg (650 mg Oral Given 03/14/17 0536)    Or  acetaminophen (TYLENOL) suppository 650 mg ( Rectal See Alternative 03/14/17 0536)  ondansetron (ZOFRAN) tablet 4 mg (not administered)  Or  ondansetron (ZOFRAN) injection 4 mg (not administered)  zolpidem (AMBIEN) tablet 5 mg (not administered)  sodium chloride 0.9 % bolus 1,000 mL (not administered)  sodium chloride flush (NS) 0.9 % injection 10-40 mL (10 mLs Intracatheter Given 03/14/17 0501)  vancomycin (VANCOCIN) 1,250 mg in sodium chloride 0.9 % 250 mL IVPB (1,250 mg Intravenous New Bag/Given 03/14/17 1107)  piperacillin-tazobactam (ZOSYN) IVPB 3.375 g (0 g Intravenous Stopped 03/14/17 0854)  0.9 %  sodium chloride infusion (not administered)  acetaminophen (TYLENOL) tablet 650 mg (650 mg Oral Given 03/13/17 1933)  sodium chloride 0.9 % bolus 1,000 mL (1,000 mLs Intravenous New Bag/Given 03/13/17 2016)  piperacillin-tazobactam (ZOSYN) IVPB 3.375 g (0 g Intravenous Stopped 03/13/17 2311)  vancomycin (VANCOCIN) IVPB 1000 mg/200 mL premix (1,000 mg Intravenous New Bag/Given 03/13/17 2240)     Initial Impression / Assessment and Plan / ED Course  I have reviewed the triage vital signs and the nursing notes.  Pertinent labs & imaging results that were available during my care of the patient were reviewed by me and considered in my medical decision making (see chart for details).     Abigail Yoder is a 51 y.o. female With a past medical history significant for diabetes, hypertension, anemia status post transfusion last week and breast-cancer status post chemotherapy last week who presents with fever. Other than a dry cough, patient denies any other systemic signs of infection. Patient reports after returning home from a doctor visit, she had a  fever of 101. Patient presents for evaluation.  History and exam are above.  On exam, patient's lungs are clear. Abdomen and chest were nontender. No focal neurologic deficits. Minor congestion her.  Patient had laboratory workup showing concern for severe neutropenia. Despite reassuring chest x-ray and urinalysis, there's clinical concern for infection.  Patient's oncology team was called and they recommended cultures, broad-spectrum antibiotics, and admission for her neutropenic fever. Patient agreed with plan and was given antibiotics. Patient admitted to hospitalist service in stable condition.    Final Clinical Impressions(s) / ED Diagnoses   Final diagnoses:  Neutropenic fever (HCC)    Clinical Impression: 1. Neutropenic fever ([place])     Disposition: Admit to Hospitalist service    Courtney Paris, MD 03/15/17 1211    Shiquan Mathieu, Gwenyth Allegra, MD 03/26/17 2033

## 2017-03-13 NOTE — Progress Notes (Signed)
Diagnosis Association: Dehydration (E86.0)  Provider: Dr. Jana Hakim  Procedure: Pt received 1000cc's via porta cath.  Pt tolerated well.  Post procedure: Pt alert, oriented and ambulatory at discharge. Discharge instructions given with verbal understanding.

## 2017-03-13 NOTE — ED Triage Notes (Signed)
Patient states she was seen at [facility] cell clinic this am and received fluids. When she got home and began feeling weak. Pt checked temp and had fever and came in due to last chemo treatment Thursday. Pt received blood transfusion last week before chemo.

## 2017-03-13 NOTE — Discharge Instructions (Signed)
Pt received 1000 cc's of normal saline today via porta cath.

## 2017-03-13 NOTE — H&P (Addendum)
History and Physical    Abigail Yoder GHW:[id number] DOB: [birth year] DOA: 03/13/2017  Referring MD/NP/PA:   PCP: [name], MD   Patient coming from:  The patient is coming from home.  At baseline, pt is independent for most of ADL.  Chief Complaint:  Fever and generalized weakness   HPI: Abigail Yoder is a 51 y.o. female with medical history significant of Breast cancer (s/p of L lumpectomy, currently on chemotherapy), hypertension, diabetes mellitus, pancytopenia, anxiety, who presents with fever and generalized weakness.  Patient states she was seen at sickle cell clinic this am and received 1L of NS fluids. When she got home and began feeling weak. Pt checked temp and had fever. Pt has mild dry cough, but no shortness breath or chest pain. She had runny nose 2 days ago, which has resolved. No sore throat. Denies symptoms of UTI. No nausea, vomiting, diarrhea or abdominal pain. Pt had chemotherapy and blood transfusion last week.  ED Course: pt was found to have pancytopenia with WBC 0.2, hemoglobin 7.0, platelet 89, electrolytes renal function okay, negative urinalysis, lactate 0.85, temperature 100.4, tachycardia, oxygen saturation 97% on room air. Negative chest x-ray for acute abnormalities. Patient is admitted to telemetry bed as inpatient. Oncologist, Dr. Irene Limbo was consulted by EDP.  Review of Systems:   General: has fevers, chills, no changes in body weight, has poor appetite, has fatigue HEENT: no blurry vision, hearing changes or sore throat Respiratory: no dyspnea, has dry coughing, no wheezing CV: no chest pain, no palpitations GI: no nausea, vomiting, abdominal pain, diarrhea, constipation GU: no dysuria, burning on urination, increased urinary frequency, hematuria  Ext: no leg edema Neuro: no unilateral weakness, numbness, or tingling, no vision change or hearing loss Skin: no rash, no skin tear. MSK: No muscle spasm, no deformity, no limitation of range of movement in  spin Heme: No easy bruising.  Travel history: No recent long distant travel.  Allergy:  Allergies  Allergen Reactions  . Pineapple Anaphylaxis and Hives    Past Medical History:  Diagnosis Date  . Anemia   . Diabetes mellitus   . Family history of breast cancer   . Hypertension   . Obesity     Past Surgical History:  Procedure Laterality Date  . APPENDECTOMY    . BREAST LUMPECTOMY WITH RADIOACTIVE SEED AND SENTINEL LYMPH NODE BIOPSY Left 12/31/2016   Procedure: BREAST LUMPECTOMY WITH RADIOACTIVE SEED AND SENTINEL LYMPH NODE BIOPSY;  Surgeon: Alphonsa Overall, MD;  Location: [facility];  Service: General;  Laterality: Left;  . CERVICAL SPINE SURGERY    . CESAREAN SECTION    . KNEE SURGERY Left   . PORTACATH PLACEMENT N/A 12/31/2016   Procedure: INSERTION PORT-A-CATH WITH [place];  Surgeon: Alphonsa Overall, MD;  Location: [facility];  Service: General;  Laterality: N/A;  . TUBAL LIGATION      Social History:  reports that she quit smoking about 22 years ago. She has never used smokeless tobacco. She reports that she does not drink alcohol or use drugs.  Family History:  Family History  Problem Relation Age of Onset  . Diabetes Mother   . Arthritis Mother   . Hypertension Father   . Heart disease Father   . Hyperlipidemia Father   . Breast cancer Sister 70    came back 17 years later  . Stroke Maternal Uncle   . Stroke Maternal Grandmother      Prior to Admission medications   Medication  Sig Start Date End Date Taking? Authorizing Provider  amLODipine (NORVASC) 5 MG tablet Take 5 mg by mouth daily.   Yes Historical Provider, MD  dexamethasone (DECADRON) 4 MG tablet Take 4 mg by mouth as directed. Take 2 tabs (8 mg) after chemo, Then Take 2 tabs (8 mg) daily for 2 days 03/08/17  Yes Historical Provider, MD  insulin aspart protamine- aspart (NOVOLOG MIX 70/30) (70-30) 100 UNIT/ML injection Inject 20 Units into the skin 2 (two) times daily with a meal.    Yes Historical Provider, MD  saccharomyces boulardii (FLORASTOR) 250 MG capsule Take 1 capsule (250 mg total) by mouth 2 (two) times daily. 02/14/17 03/16/17 Yes Ankit Arsenio Loader, MD  lidocaine-prilocaine (EMLA) cream Apply to affected area once 12/14/16   Chauncey Cruel, MD  LORazepam (ATIVAN) 0.5 MG tablet Take 1 tablet (0.5 mg total) by mouth every 6 (six) hours as needed (Nausea or vomiting). 12/14/16   Chauncey Cruel, MD  prochlorperazine (COMPAZINE) 10 MG tablet Take 1 tablet (10 mg total) by mouth every 6 (six) hours as needed (Nausea or vomiting). 12/14/16   Chauncey Cruel, MD    Physical Exam: Vitals:   03/13/17 1911 03/13/17 2144 03/13/17 2243  BP: (!) 168/78 140/73   Pulse: (!) 111 87   Resp: 16 20   Temp: (!) 100.4 F (38 C) 99.7 F (37.6 C) 99.6 F (37.6 C)  TempSrc: Oral Oral Oral  SpO2: 100% 97%   Weight: 98.4 kg (217 lb)    Height: 5' 3.5" (1.613 m)     General: Not in acute distress HEENT:       Eyes: PERRL, EOMI, no scleral icterus.       ENT: No discharge from the ears and nose, no pharynx injection, no tonsillar enlargement.        Neck: No JVD, no bruit, no mass felt. Heme: No neck lymph node enlargement. Cardiac: S1/S2, RRR, No murmurs, No gallops or rubs. Respiratory: No rales, wheezing, rhonchi or rubs. GI: Soft, nondistended, nontender, no rebound pain, no organomegaly, BS present. GU: No hematuria Ext: No pitting leg edema bilaterally. 2+DP/PT pulse bilaterally. Musculoskeletal: No joint deformities, No joint redness or warmth, no limitation of ROM in spin. Skin: No rashes.  Neuro: Alert, oriented X3, cranial nerves II-XII grossly intact, moves all extremities normally. Psych: Patient is not psychotic, no suicidal or hemocidal ideation.  Labs on Admission: I have personally reviewed following labs and imaging studies  CBC:  Recent Labs Lab 03/07/17 1336 03/13/17 1957  WBC 7.7 0.2*  NEUTROABS 6.4 0.0*  HGB 8.6* 7.0*  HCT 26.0* 20.4*  MCV  87.3 85.4  PLT 143* 89*   Basic Metabolic Panel:  Recent Labs Lab 03/07/17 1336 03/13/17 1957  NA 143 138  K 4.2 4.1  CL  --  104  CO2 26 27  GLUCOSE 142* 135*  BUN 12.6 19  CREATININE 1.4* 1.03*  CALCIUM 8.1* 7.7*   GFR: Estimated Creatinine Clearance: 72.9 mL/min (A) (by C-G formula based on SCr of 1.03 mg/dL (H)). Liver Function Tests:  Recent Labs Lab 03/07/17 1336 03/13/17 1957  AST 16 17  ALT 14 17  ALKPHOS 91 69  BILITOT <0.22 0.2*  PROT 6.9 6.0*  ALBUMIN 3.1* 2.7*   No results for input(s): LIPASE, AMYLASE in the last 168 hours. No results for input(s): AMMONIA in the last 168 hours. Coagulation Profile: No results for input(s): INR, PROTIME in the last 168 hours. Cardiac Enzymes: No results for  input(s): CKTOTAL, CKMB, CKMBINDEX, TROPONINI in the last 168 hours. BNP (last 3 results) No results for input(s): PROBNP in the last 8760 hours. HbA1C: No results for input(s): HGBA1C in the last 72 hours. CBG: No results for input(s): GLUCAP in the last 168 hours. Lipid Profile: No results for input(s): CHOL, HDL, LDLCALC, TRIG, CHOLHDL, LDLDIRECT in the last 72 hours. Thyroid Function Tests: No results for input(s): TSH, T4TOTAL, FREET4, T3FREE, THYROIDAB in the last 72 hours. Anemia Panel: No results for input(s): VITAMINB12, FOLATE, FERRITIN, TIBC, IRON, RETICCTPCT in the last 72 hours. Urine analysis:    Component Value Date/Time   COLORURINE STRAW (A) 03/13/2017 2011   APPEARANCEUR CLEAR 03/13/2017 2011   LABSPEC 1.011 03/13/2017 2011   LABSPEC 1.010 02/07/2017 1005   PHURINE 7.0 03/13/2017 2011   GLUCOSEU NEGATIVE 03/13/2017 2011   GLUCOSEU Negative 02/07/2017 1005   HGBUR NEGATIVE 03/13/2017 2011   BILIRUBINUR NEGATIVE 03/13/2017 2011   BILIRUBINUR Negative 02/07/2017 [employer] 03/13/2017 2011   PROTEINUR 100 (A) 03/13/2017 2011   UROBILINOGEN 0.2 02/07/2017 1005   NITRITE NEGATIVE 03/13/2017 2011   LEUKOCYTESUR NEGATIVE  03/13/2017 2011   LEUKOCYTESUR Negative 02/07/2017 1005   Sepsis Labs: @LABRCNTIP (procalcitonin:4,lacticidven:4) )No results found for this or any previous visit (from the past 240 hour(s)).   Radiological Exams on Admission: Dg Chest 2 View  Result Date: 03/13/2017 CLINICAL DATA:  Weakness.  History of breast cancer. EXAM: CHEST  2 VIEW COMPARISON:  02/09/2017 FINDINGS: The heart size and mediastinal contours are within normal limits. Port catheter tip is seen in the mid right atrium. Both lungs are clear. The visualized skeletal structures are unremarkable. The patient is status post ACDF of the lower cervical spine. IMPRESSION: No active cardiopulmonary disease. Port catheter tip in the mid right atrium on current exam, previously in the distal SVC -RA juncture. Electronically Signed   By: Ashley Royalty M.D.   On: 03/13/2017 20:39     EKG:  Not done in ED, will get one.   Assessment/Plan Principal Problem:   Neutropenic fever (HCC) Active Problems:   Type 2 diabetes mellitus (HCC)   Essential hypertension   Malignant neoplasm of upper-inner quadrant of left breast in female, estrogen receptor positive (HCC)   Antineoplastic chemotherapy induced pancytopenia (HCC)   Sepsis (HCC)   Neutropenia with fever (HCC)   Neutropenic fever and sepsis: Etiology is not clear. Urinalysis negative chest x-rays negative for infiltration. Patient has mild dry cough, but no shortness of breath or chest pain. Patient had runny nose 2 days ago, which has resolved, indicating possible upper respiratory viral infection. Patient meets criteria for sepsis with neutropenia, fever and tachycardia. Currently hemodynamically stable. Oncologist, Dr. Irene Limbo was consulted by EDP, who recommended to start antibiotics with broad coverage.  - will admit to tele bed as inpt - started vancomycin and Zosyn - blood culture x2 and urine culutre - will get Procalcitonin and trend lactic acid level per sepsis protocol  -IVF:  2.0 L of NS bolus, followed by 100 cc/h - Wll give one dose of Tamiflu - Follow up flu PCR  DM-II: Last A1c 7.8 on 02/11/17, not well controled. Patient is taking mixed insulin 70/30 at home -will decrease mixed insulin 70/30 dose from 20 to 15 units twice a day  -SSI  HTN: -continue amlodipine  Anxiety: -Continue home medications: When necessary Ativan  Breast cancer: s/p of L lumpectomy. Currently on Chemotherapy, Followed up with Dr. Nat Math -f/u with Dr. Nat Math  Antineoplastic  chemotherapy induced pancytopenia ([place]): -f/u by CBC  Addendum: pt's hgb dropped to 6.0 in the morning. -check FOBT, LDH, peripheral smear and haptoglobin -Will transfuse 2 unit of blood   DVT ppx: SCD Code Status: Full code Family Communication: Yes, patient's mother and brother  at bed side Disposition Plan:  Anticipate discharge back to previous home environment Consults called: Oncologist, Dr. Irene Limbo   Admission status:  Inpatient/tele   Date of Service 03/13/2017    Ivor Costa [facility] Pager [phone]  If 7PM-7AM, please contact night-coverage [email or web address] Password [facility] 03/13/2017, 11:07 PM

## 2017-03-14 ENCOUNTER — Other Ambulatory Visit: Payer: BLUE CROSS/BLUE SHIELD

## 2017-03-14 ENCOUNTER — Ambulatory Visit: Payer: BLUE CROSS/BLUE SHIELD

## 2017-03-14 ENCOUNTER — Ambulatory Visit: Payer: BLUE CROSS/BLUE SHIELD | Admitting: Oncology

## 2017-03-14 ENCOUNTER — Inpatient Hospital Stay (HOSPITAL_COMMUNITY): Payer: BLUE CROSS/BLUE SHIELD

## 2017-03-14 DIAGNOSIS — R5081 Fever presenting with conditions classified elsewhere: Secondary | ICD-10-CM

## 2017-03-14 DIAGNOSIS — I10 Essential (primary) hypertension: Secondary | ICD-10-CM

## 2017-03-14 DIAGNOSIS — D709 Neutropenia, unspecified: Secondary | ICD-10-CM

## 2017-03-14 DIAGNOSIS — Z170 Estrogen receptor positive status [ER+]: Secondary | ICD-10-CM

## 2017-03-14 DIAGNOSIS — C50212 Malignant neoplasm of upper-inner quadrant of left female breast: Secondary | ICD-10-CM

## 2017-03-14 LAB — BRAIN NATRIURETIC PEPTIDE: B NATRIURETIC PEPTIDE 5: 81.5 pg/mL (ref 0.0–100.0)

## 2017-03-14 LAB — LACTIC ACID, PLASMA
LACTIC ACID, VENOUS: 1.1 mmol/L (ref 0.5–1.9)
Lactic Acid, Venous: 1 mmol/L (ref 0.5–1.9)

## 2017-03-14 LAB — LACTATE DEHYDROGENASE: LDH: 160 U/L (ref 98–192)

## 2017-03-14 LAB — PROTIME-INR
INR: 1.14
Prothrombin Time: 14.6 seconds (ref 11.4–15.2)

## 2017-03-14 LAB — CBC
HCT: 17.5 % — ABNORMAL LOW (ref 36.0–46.0)
Hemoglobin: 6 g/dL — CL (ref 12.0–15.0)
MCH: 28.6 pg (ref 26.0–34.0)
MCHC: 34.3 g/dL (ref 30.0–36.0)
MCV: 83.3 fL (ref 78.0–100.0)
PLATELETS: 60 10*3/uL — AB (ref 150–400)
RBC: 2.1 MIL/uL — ABNORMAL LOW (ref 3.87–5.11)
RDW: 14.5 % (ref 11.5–15.5)
WBC: 0.1 10*3/uL — CL (ref 4.0–10.5)

## 2017-03-14 LAB — BASIC METABOLIC PANEL
Anion gap: 6 (ref 5–15)
BUN: 14 mg/dL (ref 6–20)
CHLORIDE: 105 mmol/L (ref 101–111)
CO2: 26 mmol/L (ref 22–32)
CREATININE: 1.04 mg/dL — AB (ref 0.44–1.00)
Calcium: 7.2 mg/dL — ABNORMAL LOW (ref 8.9–10.3)
GFR calc Af Amer: >60 mL/min (ref >60)
Glucose, Bld: 175 mg/dL — ABNORMAL HIGH (ref 65–99)
Potassium: 3.9 mmol/L (ref 3.5–5.1)
Sodium: 137 mmol/L (ref 135–145)

## 2017-03-14 LAB — GLUCOSE, CAPILLARY
GLUCOSE-CAPILLARY: 76 mg/dL (ref 65–99)
Glucose-Capillary: 137 mg/dL — ABNORMAL HIGH (ref 65–99)

## 2017-03-14 LAB — PREPARE RBC (CROSSMATCH)

## 2017-03-14 LAB — INFLUENZA PANEL BY PCR (TYPE A & B)
INFLBPCR: NEGATIVE
Influenza A By PCR: NEGATIVE

## 2017-03-14 LAB — PROCALCITONIN: Procalcitonin: 0.18 ng/mL

## 2017-03-14 LAB — SAVE SMEAR

## 2017-03-14 MED ORDER — DIPHENHYDRAMINE HCL 50 MG/ML IJ SOLN
25.0000 mg | Freq: Once | INTRAMUSCULAR | Status: AC
  Administered 2017-03-14: 25 mg via INTRAVENOUS
  Filled 2017-03-14: qty 1

## 2017-03-14 MED ORDER — SODIUM CHLORIDE 0.9 % IV BOLUS (SEPSIS)
500.0000 mL | Freq: Once | INTRAVENOUS | Status: AC
  Administered 2017-03-14: 500 mL via INTRAVENOUS

## 2017-03-14 MED ORDER — PIPERACILLIN-TAZOBACTAM 3.375 G IVPB
3.3750 g | Freq: Three times a day (TID) | INTRAVENOUS | Status: DC
  Administered 2017-03-14 – 2017-03-16 (×8): 3.375 g via INTRAVENOUS
  Filled 2017-03-14 (×9): qty 50

## 2017-03-14 MED ORDER — ACETAMINOPHEN 325 MG PO TABS
650.0000 mg | ORAL_TABLET | ORAL | Status: AC
  Administered 2017-03-14: 650 mg via ORAL
  Filled 2017-03-14: qty 2

## 2017-03-14 MED ORDER — VANCOMYCIN HCL 10 G IV SOLR
1250.0000 mg | Freq: Two times a day (BID) | INTRAVENOUS | Status: DC
  Administered 2017-03-14 – 2017-03-15 (×4): 1250 mg via INTRAVENOUS
  Filled 2017-03-14 (×5): qty 1250

## 2017-03-14 MED ORDER — SODIUM CHLORIDE 0.9 % IV SOLN: Freq: Once | INTRAVENOUS | Status: DC

## 2017-03-14 MED ORDER — SODIUM CHLORIDE 0.9% FLUSH
10.0000 mL | INTRAVENOUS | Status: DC | PRN
  Administered 2017-03-14 – 2017-03-18 (×3): 10 mL
  Filled 2017-03-14 (×3): qty 40

## 2017-03-14 NOTE — Progress Notes (Signed)
Pharmacy Antibiotic Note  Abigail Yoder is a 51 y.o. female admitted on 03/13/2017 with sepsis.  Pharmacy has been consulted for Vancomycin and Zosyn dosing.  Plan: Vancomycin 1250mg  IV every 12 hours.  Goal trough 15-20 mcg/mL. Zosyn 3.375g IV q8h (4 hour infusion).  Height: 5' 3.5" (161.3 cm) Weight: 217 lb 6 oz (98.6 kg) IBW/kg (Calculated) : 53.55  Temp (24hrs), Avg:100.2 F (37.9 C), Min:99.6 F (37.6 C), Max:100.8 F (38.2 C)   Recent Labs Lab 03/07/17 1336 03/13/17 1957 03/13/17 2009 03/14/17 0102  WBC 7.7 0.2*  --   --   CREATININE 1.4* 1.03*  --   --   LATICACIDVEN  --   --  0.85 1.1    Estimated Creatinine Clearance: 73 mL/min (A) (by C-G formula based on SCr of 1.03 mg/dL (H)).    Allergies  Allergen Reactions  . Pineapple Anaphylaxis and Hives    Antimicrobials this admission: Vancomycin 03/13/2017 >> Zosyn 03/13/2017 >>   Dose adjustments this admission: -  Microbiology results: pending  Thank you for allowing pharmacy to be a part of this patient's care.  Nani Skillern Crowford 03/14/2017 5:16 AM

## 2017-03-14 NOTE — Progress Notes (Signed)
MARILY KONCZAL   DOB:[birth year]   PJ#:[id number]   PYK#:[id number]  Subjective: Raigen did generally well with her last cycle of cyclophosophamide/ doxorubicin, which she received 03/07/2017. She received neulasta 04/20. We supported her with IVF as outpatient and she was scheduled to see me again today. However yesterday after IVF (and walking up 3 flights to her apt) she felt "terrible," checked her temp and found she had a fever to 101.3. She had a prior episode of FN so she knew what to do. In the ED she was found to be febrile (100.8) with total WBC 0.2. She was admitted, pancultured, had a benign CXR, and was started on vancomycin and piperacillin/tazobactam  She had a mild ST 2 days ago, resolved; slight dry cough, unchanged from baseline; no significant HA, N/V, dizzyness or balance problems; loose BMs last weekend, now normal; no dysuria or frequency; no problems w her port   Objective: middle aged [place] American woman examined in bed Vitals:   03/14/17 0133 03/14/17 0451  BP: (!) 179/86 (!) 125/53  Pulse: 91 92  Resp: 20 18  Temp: 99.9 F (37.7 C) (!) 100.7 F (38.2 C)    Body mass index is 37.9 kg/m.  Intake/Output Summary (Last 24 hours) at 03/14/17 0744 Last data filed at 03/14/17 0501  Gross per 24 hour  Intake           106.67 ml  Output                2 ml  Net           104.67 ml     Oropharynx shows no thrush or other lesions  No cervical or supraclavicular adenopathy  Lungs no rales or wheezes--auscultated anterolaterally  Heart regular rate and rhythm, no murmur appreciated  Abdomen soft, +BS  Neuro nonfocal  Breast exam: deferred  CBG (last 3)  No results for input(s): GLUCAP in the last 72 hours.   Labs:  Lab Results  Component Value Date   WBC 0.1 (LL) 03/14/2017   HGB 6.0 (LL) 03/14/2017   HCT 17.5 (L) 03/14/2017   MCV 83.3 03/14/2017   PLT 60 (L) 03/14/2017   NEUTROABS 0.0 (L) 03/13/2017    '@LASTCHEMISTRY' @  Urine Studies No results for  input(s): UHGB, CRYS in the last 72 hours.  Invalid input(s): UACOL, UAPR, USPG, UPH, UTP, UGL, UKET, UBIL, UNIT, UROB, ULEU, UEPI, UWBC, URBC, UBAC, CAST, UCOM, [place]  Basic Metabolic Panel:  Recent Labs Lab 03/07/17 1336 03/13/17 1957 03/14/17 0455  NA 143 138 137  K 4.2 4.1 3.9  CL  --  104 105  CO2 '26 27 26  ' GLUCOSE 142* 135* 175*  BUN 12.'6 19 14  ' CREATININE 1.4* 1.03* 1.04*  CALCIUM 8.1* 7.7* 7.2*   GFR Estimated Creatinine Clearance: 72.3 mL/min (A) (by C-G formula based on SCr of 1.04 mg/dL (H)). Liver Function Tests:  Recent Labs Lab 03/07/17 1336 03/13/17 1957  AST 16 17  ALT 14 17  ALKPHOS 91 69  BILITOT <0.22 0.2*  PROT 6.9 6.0*  ALBUMIN 3.1* 2.7*   No results for input(s): LIPASE, AMYLASE in the last 168 hours. No results for input(s): AMMONIA in the last 168 hours. Coagulation profile No results for input(s): INR, PROTIME in the last 168 hours.  CBC:  Recent Labs Lab 03/07/17 1336 03/13/17 1957 03/14/17 0455  WBC 7.7 0.2* 0.1*  NEUTROABS 6.4 0.0*  --   HGB 8.6* 7.0* 6.0*  HCT 26.0* 20.4* 17.5*  MCV 87.3 85.4 83.3  PLT 143* 89* 60*   Cardiac Enzymes: No results for input(s): CKTOTAL, CKMB, CKMBINDEX, TROPONINI in the last 168 hours. BNP: Invalid input(s): POCBNP CBG: No results for input(s): GLUCAP in the last 168 hours. D-Dimer No results for input(s): DDIMER in the last 72 hours. Hgb A1c No results for input(s): HGBA1C in the last 72 hours. Lipid Profile No results for input(s): CHOL, HDL, LDLCALC, TRIG, CHOLHDL, LDLDIRECT in the last 72 hours. Thyroid function studies No results for input(s): TSH, T4TOTAL, T3FREE, THYROIDAB in the last 72 hours.  Invalid input(s): FREET3 Anemia work up No results for input(s): VITAMINB12, FOLATE, FERRITIN, TIBC, IRON, RETICCTPCT in the last 72 hours. Microbiology No results found for this or any previous visit (from the past 240 hour(s)).    Studies:  Dg Chest 2 View  Result Date:  03/13/2017 CLINICAL DATA:  Weakness.  History of breast cancer. EXAM: CHEST  2 VIEW COMPARISON:  02/09/2017 FINDINGS: The heart size and mediastinal contours are within normal limits. Port catheter tip is seen in the mid right atrium. Both lungs are clear. The visualized skeletal structures are unremarkable. The patient is status post ACDF of the lower cervical spine. IMPRESSION: No active cardiopulmonary disease. Port catheter tip in the mid right atrium on current exam, previously in the distal SVC -RA juncture. Electronically Signed   By: Ashley Royalty M.D.   On: 03/13/2017 20:39    Assessment: 51 y.o. [place] woman ad mitted with febrile neutropenia 03/13/2017, status post left breast upper inner quadrant biopsy 11/21/2016 for a clinical T1 cN0, stage IA invasive ductal carcinoma, grade 3, estrogen and progesterone receptor positive, HER-2 not amplified, with an MIB-1 of 90%, admitted 03/22 with febrile neutropenia day 8 cycle 2 of cyclphosphamide/ doxorubicin  (1) Oncotype DX Score of 60 predicts a risk of recurrence outside the breast of 34% if the patient's only systemic therapy is tamoxifen for 5 years. On a similar database this risk drops to about 12% if chemotherapy is also given  (2) genetics testing sent 11/28/2016; The Breast/GYN gene panel offered by [facility] includes sequencing and rearrangement analysis for the following 23 genes: ATM, BARD1, BRCA1, BRCA2, BRIP1, CDH1, CHEK2, EPCAM, FANCC, MLH1, MSH2, MSH6, MUTYH, NBN, NF1, PALB2, PMS2, POLD1, PTEN, RAD51C, RAD51D, RECQL, and TP53. Genetic testing did not reveal a deleterious mutation, however it detected a Variant of Unknown Significance in the RAD51Dgene called [id number].   (3) status post left breast lumpectomy on 12/31/2016: invasive ductal carcinoma, 1.8cm, grade 3, margins negative, 1 SLN negative, ER+(80%), PR-(2%), Ki-67 90%, HER-2 negative (ratio 1.05). T1cN0  (4) chemotherapy consisting of cyclophosphamide and doxorubicin  in dose dense fashion 4 (started 01/17/17), to be followed by Abraxane weekly 12.   (5) adjuvant radiation to follow as appropriate.  (6) febrile neutropenia; CURRENTLY: day 9 cycle 4 chemo, day 8 neulasta, day 2 antibiotics  Plan:  She received neulasta 8 days ago. Adding neupogen at this point would not be helpful. I anticipate neutrophil recovery in 48-86 hrs.  Adding vancomycin as you have done is appropriate in this pt with a central line and prior courses of ABX. Cultures are pending.  Her Hb has been 8.0 - 9.0 recently; now 6.0 -- agree with planned transfusion  Thankfully she is done with the current aggressive chemotherapy. Her remaining treatments will be much milder and will not significantly lower her WBC  I have scheduled her to see me 05/04 at 11 AM. Will follow with you while in  hospital  Greatly appreciate your help to this patient!     Chauncey Cruel, MD 03/14/2017  7:44 AM Medical Oncology and Hematology [facility] [address] [place], [place] [address] Tel. [phone]    Fax. [phone]

## 2017-03-14 NOTE — Progress Notes (Signed)
PROGRESS NOTE    Abigail Yoder  NOB:[id number] DOB: 1966/09/08 DOA: 03/13/2017 PCP: [name], MD    Brief Narrative:  51 y.o. female with medical history significant of Breast cancer (s/p of L lumpectomy, currently on chemotherapy), hypertension, diabetes mellitus, pancytopenia, anxiety, who presents with fever and generalized weakness.  Patient states she was seen at sickle cell clinic this am and received 1L of NS fluids. When she got home and began feeling weak. Pt checked temp and had fever. Pt has mild dry cough, but no shortness breath or chest pain. She had runny nose 2 days ago, which has resolved. No sore throat. Denies symptoms of UTI. No nausea, vomiting, diarrhea or abdominal pain. Pt had chemotherapy and blood transfusion last week.  Assessment & Plan:   Principal Problem:   Neutropenic fever ([place]) Active Problems:   Type 2 diabetes mellitus (HCC)   Essential hypertension   Malignant neoplasm of upper-inner quadrant of left breast in female, estrogen receptor positive (HCC)   Antineoplastic chemotherapy induced pancytopenia (HCC)   Sepsis ([place])   Neutropenia with fever (HCC)  Neutropenic fever and sepsis:  -Cultures are pending - CXR is clear - UA unremarkable - Patient is continued on empiric vanc and zosyn - Appreciate input by Oncology. Chart reviewed. Oncology recs for transfusion as planned. Neupogen not recommended as neulasta was given on 4/19 - Will repeat CBC in AM  DM-II: Last A1c 7.8 on 02/11/17, not well controled.  - Patient has been taking mixed insulin 70/30 at home - insulin 70/30 dose decreased from 20 to 15 units twice a day  - Will continue SSI as needed  HTN: - Will continue amlodipine as tolerated  Anxiety: -Will continue home medications: - Plan to continue PRN Ativan  Breast cancer:  - Pt is s/p of L lumpectomy and currently on Chemotherapy -Appreciate input by Dr. Jana Hakim  Antineoplastic chemotherapy induced pancytopenia  [facility]): -Hgb 6 this AM - 2 units prbcs being transfused - Repeat CBC in AM  DVT prophylaxis: SCD's Code Status: Full Family Communication: Pt in room, family not at bedside Disposition Plan: Uncertain at this time  Consultants:   Oncology  Procedures:     Antimicrobials: Anti-infectives    Start     Dose/Rate Route Frequency Ordered Stop   03/14/17 1000  vancomycin (VANCOCIN) 1,250 mg in sodium chloride 0.9 % 250 mL IVPB     1,250 mg 166.7 mL/hr over 90 Minutes Intravenous Every 12 hours 03/14/17 0514     03/14/17 0600  piperacillin-tazobactam (ZOSYN) IVPB 3.375 g     3.375 g 12.5 mL/hr over 240 Minutes Intravenous Every 8 hours 03/14/17 0514     03/13/17 2245  oseltamivir (TAMIFLU) capsule 75 mg     75 mg Oral Once 03/13/17 2243     03/13/17 2230  piperacillin-tazobactam (ZOSYN) IVPB 3.375 g     3.375 g 100 mL/hr over 30 Minutes Intravenous  Once 03/13/17 2221 03/13/17 2311   03/13/17 2230  vancomycin (VANCOCIN) IVPB 1000 mg/200 mL premix     1,000 mg 200 mL/hr over 60 Minutes Intravenous  Once 03/13/17 2221 03/13/17 2340       Subjective: Without complaints  Objective: Vitals:   03/14/17 0451 03/14/17 1222 03/14/17 1427 03/14/17 1500  BP: (!) 125/53  123/64 (!) 141/92  Pulse: 92  93 92  Resp: 18  20 20   Temp: (!) 100.7 F (38.2 C) 98.6 F (37 C) 100.3 F (37.9 C) (!) 100.5 F (38.1 C)  TempSrc: Oral Oral Oral Oral  SpO2: 100%  100%   Weight:      Height:        Intake/Output Summary (Last 24 hours) at 03/14/17 1558 Last data filed at 03/14/17 1500  Gross per 24 hour  Intake           836.67 ml  Output                2 ml  Net           834.67 ml   Filed Weights   03/13/17 1911 03/14/17 0133  Weight: 98.4 kg (217 lb) 98.6 kg (217 lb 6 oz)    Examination:  General exam: Appears calm and comfortable  Respiratory system: Clear to auscultation. Respiratory effort normal. Cardiovascular system: S1 & S2 heard, RRR.  Gastrointestinal system:  Abdomen is nondistended, soft and nontender. No organomegaly or masses felt. Normal bowel sounds heard. Central nervous system: Alert and oriented. No focal neurological deficits. Extremities: Symmetric 5 x 5 power. Skin: No rashes, lesions Psychiatry: Judgement and insight appear normal. Mood & affect appropriate.   Data Reviewed: I have personally reviewed following labs and imaging studies  CBC:  Recent Labs Lab 03/13/17 1957 03/14/17 0455  WBC 0.2* 0.1*  NEUTROABS 0.0*  --   HGB 7.0* 6.0*  HCT 20.4* 17.5*  MCV 85.4 83.3  PLT 89* 60*   Basic Metabolic Panel:  Recent Labs Lab 03/13/17 1957 03/14/17 0455  NA 138 137  K 4.1 3.9  CL 104 105  CO2 27 26  GLUCOSE 135* 175*  BUN 19 14  CREATININE 1.03* 1.04*  CALCIUM 7.7* 7.2*   GFR: Estimated Creatinine Clearance: 72.3 mL/min (A) (by C-G formula based on SCr of 1.04 mg/dL (H)). Liver Function Tests:  Recent Labs Lab 03/13/17 1957  AST 17  ALT 17  ALKPHOS 69  BILITOT 0.2*  PROT 6.0*  ALBUMIN 2.7*   No results for input(s): LIPASE, AMYLASE in the last 168 hours. No results for input(s): AMMONIA in the last 168 hours. Coagulation Profile:  Recent Labs Lab 03/14/17 1027  INR 1.14   Cardiac Enzymes: No results for input(s): CKTOTAL, CKMB, CKMBINDEX, TROPONINI in the last 168 hours. BNP (last 3 results) No results for input(s): PROBNP in the last 8760 hours. HbA1C: No results for input(s): HGBA1C in the last 72 hours. CBG:  Recent Labs Lab 03/14/17 0809 03/14/17 1150  GLUCAP 137* 76   Lipid Profile: No results for input(s): CHOL, HDL, LDLCALC, TRIG, CHOLHDL, LDLDIRECT in the last 72 hours. Thyroid Function Tests: No results for input(s): TSH, T4TOTAL, FREET4, T3FREE, THYROIDAB in the last 72 hours. Anemia Panel: No results for input(s): VITAMINB12, FOLATE, FERRITIN, TIBC, IRON, RETICCTPCT in the last 72 hours. Sepsis Labs:  Recent Labs Lab 03/13/17 2009 03/14/17 0102 03/14/17 0455    PROCALCITON  --  0.18  --   LATICACIDVEN 0.85 1.1 1.0    Recent Results (from the past 240 hour(s))  Blood Culture (routine x 2)     Status: None (Preliminary result)   Collection Time: 03/13/17 10:21 PM  Result Value Ref Range Status   Specimen Description BLOOD PORT  Final   Special Requests   Final    BOTTLES DRAWN AEROBIC AND ANAEROBIC Blood Culture adequate volume   Culture   Final    NO GROWTH < 24 HOURS Performed at [facility], [place] [address], [place], [place] [address]    Report Status PENDING  Incomplete  Blood Culture (routine x 2)     Status: None (Preliminary result)   Collection Time: 03/13/17 10:37 PM  Result Value Ref Range Status   Specimen Description BLOOD RIGHT ANTECUBITAL  Final   Special Requests   Final    BOTTLES DRAWN AEROBIC AND ANAEROBIC Blood Culture adequate volume   Culture   Final    NO GROWTH < 24 HOURS Performed at [facility], [place] [address], [place], [place] [address]    Report Status PENDING  Incomplete     Radiology Studies: Dg Chest 2 View  Result Date: 03/13/2017 CLINICAL DATA:  Weakness.  History of breast cancer. EXAM: CHEST  2 VIEW COMPARISON:  02/09/2017 FINDINGS: The heart size and mediastinal contours are within normal limits. Port catheter tip is seen in the mid right atrium. Both lungs are clear. The visualized skeletal structures are unremarkable. The patient is status post ACDF of the lower cervical spine. IMPRESSION: No active cardiopulmonary disease. Port catheter tip in the mid right atrium on current exam, previously in the distal SVC -RA juncture. Electronically Signed   By: Ashley Royalty M.D.   On: 03/13/2017 20:39    Scheduled Meds: . amLODipine  5 mg Oral Daily  . insulin aspart  0-9 Units Subcutaneous TID WC  . insulin aspart protamine- aspart  15 Units Subcutaneous BID WC  . oseltamivir  75 mg Oral Once  . saccharomyces boulardii  250 mg Oral BID  . sodium chloride flush  3 mL Intravenous Q12H    Continuous Infusions: . sodium chloride 100 mL/hr at 03/14/17 0102  . sodium chloride    . piperacillin-tazobactam (ZOSYN)  IV Stopped (03/14/17 0854)  . sodium chloride    . vancomycin Stopped (03/14/17 1237)     LOS: 1 day   Xane Amsden, Orpah Melter, MD [facility] Pager [phone]  If 7PM-7AM, please contact night-coverage [email or web address] Password [email or web address] 03/14/2017, 3:58 PM

## 2017-03-14 NOTE — Care Management Note (Signed)
Case Management Note  Patient Details  Name: GENNESIS HOGLAND MRN: [id number] Date of Birth: 1966-06-04  Subjective/Objective: 51 y/o f admitted w/Neutropenic fever. Hx: Breast Ca. Readmit-3/22-3/29-Neutropenic fever. From home.                   Action/Plan:d/c plan home.   Expected Discharge Date:                  Expected Discharge Plan:  Home/Self Care  In-House Referral:     Discharge planning Services  CM Consult  Post Acute Care Choice:    Choice offered to:     DME Arranged:    DME Agency:     HH Arranged:    HH Agency:     Status of Service:  In process, will continue to follow  If discussed at Long Length of Stay Meetings, dates discussed:    Additional Comments:  Dessa Phi, RN 03/14/2017, 1:04 PM

## 2017-03-15 LAB — URINE CULTURE

## 2017-03-15 LAB — CBC
HEMATOCRIT: 22.2 % — AB (ref 36.0–46.0)
HEMOGLOBIN: 7.8 g/dL — AB (ref 12.0–15.0)
MCH: 29.7 pg (ref 26.0–34.0)
MCHC: 35.1 g/dL (ref 30.0–36.0)
MCV: 84.4 fL (ref 78.0–100.0)
Platelets: 40 10*3/uL — ABNORMAL LOW (ref 150–400)
RBC: 2.63 MIL/uL — ABNORMAL LOW (ref 3.87–5.11)
RDW: 14.5 % (ref 11.5–15.5)
WBC: 0.1 10*3/uL — CL (ref 4.0–10.5)

## 2017-03-15 LAB — CREATININE, SERUM
Creatinine, Ser: 1.07 mg/dL — ABNORMAL HIGH (ref 0.44–1.00)
GFR calc Af Amer: >60 mL/min (ref >60)
GFR calc non Af Amer: 59 mL/min — ABNORMAL LOW (ref >60)

## 2017-03-15 LAB — HAPTOGLOBIN: Haptoglobin: 206 mg/dL — ABNORMAL HIGH (ref 34–200)

## 2017-03-15 LAB — GLUCOSE, CAPILLARY
GLUCOSE-CAPILLARY: 81 mg/dL (ref 65–99)
Glucose-Capillary: 186 mg/dL — ABNORMAL HIGH (ref 65–99)
Glucose-Capillary: 96 mg/dL (ref 65–99)
Glucose-Capillary: 120 mg/dL — ABNORMAL HIGH (ref 65–99)

## 2017-03-15 LAB — PATHOLOGIST SMEAR REVIEW

## 2017-03-15 MED ORDER — MAGIC MOUTHWASH
10.0000 mL | Freq: Four times a day (QID) | ORAL | Status: DC | PRN
  Administered 2017-03-17: 10 mL via ORAL
  Filled 2017-03-15 (×3): qty 10

## 2017-03-15 MED ORDER — BENZONATATE 100 MG PO CAPS
100.0000 mg | ORAL_CAPSULE | Freq: Three times a day (TID) | ORAL | Status: DC | PRN
  Administered 2017-03-15: 100 mg via ORAL
  Filled 2017-03-15: qty 1

## 2017-03-15 NOTE — Progress Notes (Signed)
PROGRESS NOTE    Abigail Yoder  ZOX:[id number] DOB: [birth year] DOA: 03/13/2017 PCP: [name], MD    Brief Narrative:  51 y.o. female with medical history significant of Breast cancer (s/p of L lumpectomy, currently on chemotherapy), hypertension, diabetes mellitus, pancytopenia, anxiety, who presents with fever and generalized weakness.  Patient states she was seen at sickle cell clinic this am and received 1L of NS fluids. When she got home and began feeling weak. Pt checked temp and had fever. Pt has mild dry cough, but no shortness breath or chest pain. She had runny nose 2 days ago, which has resolved. No sore throat. Denies symptoms of UTI. No nausea, vomiting, diarrhea or abdominal pain. Pt had chemotherapy and blood transfusion last week.  Assessment & Plan:   Principal Problem:   Neutropenic fever ([place]) Active Problems:   Type 2 diabetes mellitus (HCC)   Essential hypertension   Malignant neoplasm of upper-inner quadrant of left breast in female, estrogen receptor positive (HCC)   Antineoplastic chemotherapy induced pancytopenia (HCC)   Sepsis (HCC)   Neutropenia with fever (HCC)  Neutropenic fever and sepsis:  - Final cultures are pending, thus far blood cx neg x2 - CXR is clear - UA unremarkable - Will continue on empiric vanc and zosyn - Appreciate input by Oncology. Chart reviewed. Oncology recs for transfusion as planned. Neupogen not recommended as neulasta was given on 4/19 - Will repeat CBC in AM  DM-II: Last A1c 7.8 on 02/11/17, not well controled.  - Patient has been taking mixed insulin 70/30 at home - insulin 70/30 dose decreased from 20 to 15 units twice a day  - Patient to cont SSI as needed  HTN: - Plan to continue amlodipine as tolerated  Anxiety: -Will continue home medications: - Will continue PRN Ativan  Breast cancer:  - Pt is s/p of L lumpectomy and currently on Chemotherapy -Appreciate input by Dr. Jana Hakim  Antineoplastic  chemotherapy induced pancytopenia [facility]): -Hgb 6 this AM - 2 units prbcs being transfused - Plan to repeat CBC in AM  DVT prophylaxis: SCD's Code Status: Full Family Communication: Pt in room, family not at bedside Disposition Plan: Uncertain at this time  Consultants:   Oncology  Procedures:     Antimicrobials: Anti-infectives    Start     Dose/Rate Route Frequency Ordered Stop   03/14/17 1000  vancomycin (VANCOCIN) 1,250 mg in sodium chloride 0.9 % 250 mL IVPB     1,250 mg 166.7 mL/hr over 90 Minutes Intravenous Every 12 hours 03/14/17 0514     03/14/17 0600  piperacillin-tazobactam (ZOSYN) IVPB 3.375 g     3.375 g 12.5 mL/hr over 240 Minutes Intravenous Every 8 hours 03/14/17 0514     03/13/17 2245  oseltamivir (TAMIFLU) capsule 75 mg     75 mg Oral Once 03/13/17 2243     03/13/17 2230  piperacillin-tazobactam (ZOSYN) IVPB 3.375 g     3.375 g 100 mL/hr over 30 Minutes Intravenous  Once 03/13/17 2221 03/13/17 2311   03/13/17 2230  vancomycin (VANCOCIN) IVPB 1000 mg/200 mL premix     1,000 mg 200 mL/hr over 60 Minutes Intravenous  Once 03/13/17 2221 03/13/17 2340      Subjective: Feeling chills  Objective: Vitals:   03/15/17 0235 03/15/17 0431 03/15/17 0517 03/15/17 1325  BP: (!) 157/77 (!) 156/87 (!) 143/76 (!) 161/84  Pulse: 90 97 84 92  Resp: 18 18 18 18   Temp: 99.7 F (37.6 C) (!) 101 F (38.3  C) (!) 100.8 F (38.2 C) (!) 102.6 F (39.2 C)  TempSrc: Oral Oral Oral Oral  SpO2: 100% 100% 100% 99%  Weight:      Height:        Intake/Output Summary (Last 24 hours) at 03/15/17 1604 Last data filed at 03/15/17 1454  Gross per 24 hour  Intake             5260 ml  Output                0 ml  Net             5260 ml   Filed Weights   03/13/17 1911 03/14/17 0133  Weight: 98.4 kg (217 lb) 98.6 kg (217 lb 6 oz)    Examination:  General exam: laying in bed, awake, in nad Respiratory system: normal resp effort, no auditory wheezing Cardiovascular  system: regular rate, s1, s2 Gastrointestinal system: soft, nondistended, pos BS Central nervous system: cn2-12 grossly intact, strength intact Extremities: perfused, no clubbing Skin: normal skin turgor, no notable skin lesions seen Psychiatry: mood normal// no visual hallucinations  Data Reviewed: I have personally reviewed following labs and imaging studies  CBC:  Recent Labs Lab 03/13/17 1957 03/14/17 0455 03/15/17 0500  WBC 0.2* 0.1* 0.1*  NEUTROABS 0.0*  --   --   HGB 7.0* 6.0* 7.8*  HCT 20.4* 17.5* 22.2*  MCV 85.4 83.3 84.4  PLT 89* 60* 40*   Basic Metabolic Panel:  Recent Labs Lab 03/13/17 1957 03/14/17 0455 03/15/17 0500  NA 138 137  --   K 4.1 3.9  --   CL 104 105  --   CO2 27 26  --   GLUCOSE 135* 175*  --   BUN 19 14  --   CREATININE 1.03* 1.04* 1.07*  CALCIUM 7.7* 7.2*  --    GFR: Estimated Creatinine Clearance: 70.3 mL/min (A) (by C-G formula based on SCr of 1.07 mg/dL (H)). Liver Function Tests:  Recent Labs Lab 03/13/17 1957  AST 17  ALT 17  ALKPHOS 69  BILITOT 0.2*  PROT 6.0*  ALBUMIN 2.7*   No results for input(s): LIPASE, AMYLASE in the last 168 hours. No results for input(s): AMMONIA in the last 168 hours. Coagulation Profile:  Recent Labs Lab 03/14/17 1027  INR 1.14   Cardiac Enzymes: No results for input(s): CKTOTAL, CKMB, CKMBINDEX, TROPONINI in the last 168 hours. BNP (last 3 results) No results for input(s): PROBNP in the last 8760 hours. HbA1C: No results for input(s): HGBA1C in the last 72 hours. CBG:  Recent Labs Lab 03/14/17 1150 03/14/17 1653 03/14/17 2125 03/15/17 0813 03/15/17 1209  GLUCAP 76 186* 81 96 120*   Lipid Profile: No results for input(s): CHOL, HDL, LDLCALC, TRIG, CHOLHDL, LDLDIRECT in the last 72 hours. Thyroid Function Tests: No results for input(s): TSH, T4TOTAL, FREET4, T3FREE, THYROIDAB in the last 72 hours. Anemia Panel: No results for input(s): VITAMINB12, FOLATE, FERRITIN, TIBC,  IRON, RETICCTPCT in the last 72 hours. Sepsis Labs:  Recent Labs Lab 03/13/17 2009 03/14/17 0102 03/14/17 0455  PROCALCITON  --  0.18  --   LATICACIDVEN 0.85 1.1 1.0    Recent Results (from the past 240 hour(s))  Blood Culture (routine x 2)     Status: None (Preliminary result)   Collection Time: 03/13/17 10:21 PM  Result Value Ref Range Status   Specimen Description BLOOD PORT  Final   Special Requests   Final    BOTTLES DRAWN AEROBIC  AND ANAEROBIC Blood Culture adequate volume   Culture   Final    NO GROWTH 2 DAYS Performed at [facility], [place] [address] [place], [place] [address]    Report Status PENDING  Incomplete  Blood Culture (routine x 2)     Status: None (Preliminary result)   Collection Time: 03/13/17 10:37 PM  Result Value Ref Range Status   Specimen Description BLOOD RIGHT ANTECUBITAL  Final   Special Requests   Final    BOTTLES DRAWN AEROBIC AND ANAEROBIC Blood Culture adequate volume   Culture   Final    NO GROWTH 2 DAYS Performed at [facility], [place] [address], [place], [place] [address]    Report Status PENDING  Incomplete  Urine culture     Status: Abnormal   Collection Time: 03/13/17 11:03 PM  Result Value Ref Range Status   Specimen Description URINE, RANDOM  Final   Special Requests NONE  Final   Culture MULTIPLE SPECIES PRESENT, SUGGEST RECOLLECTION (A)  Final   Report Status 03/15/2017 FINAL  Final     Radiology Studies: Dg Chest 2 View  Result Date: 03/13/2017 CLINICAL DATA:  Weakness.  History of breast cancer. EXAM: CHEST  2 VIEW COMPARISON:  02/09/2017 FINDINGS: The heart size and mediastinal contours are within normal limits. Port catheter tip is seen in the mid right atrium. Both lungs are clear. The visualized skeletal structures are unremarkable. The patient is status post ACDF of the lower cervical spine. IMPRESSION: No active cardiopulmonary disease. Port catheter tip in the mid right atrium on current exam, previously in  the distal SVC -RA juncture. Electronically Signed   By: Ashley Royalty M.D.   On: 03/13/2017 20:39    Scheduled Meds: . amLODipine  5 mg Oral Daily  . insulin aspart  0-9 Units Subcutaneous TID WC  . insulin aspart protamine- aspart  15 Units Subcutaneous BID WC  . oseltamivir  75 mg Oral Once  . saccharomyces boulardii  250 mg Oral BID  . sodium chloride flush  3 mL Intravenous Q12H   Continuous Infusions: . sodium chloride 100 mL/hr at 03/14/17 0102  . sodium chloride    . piperacillin-tazobactam (ZOSYN)  IV 3.375 g (03/15/17 1323)  . sodium chloride    . vancomycin Stopped (03/15/17 1222)     LOS: 2 days   Charm Stenner, Orpah Melter, MD [facility] Pager [phone]  If 7PM-7AM, please contact night-coverage [email or web address] Password [facility] 03/15/2017, 4:04 PM

## 2017-03-16 LAB — BASIC METABOLIC PANEL WITH GFR
Anion gap: 9 (ref 5–15)
BUN: 9 mg/dL (ref 6–20)
CO2: 23 mmol/L (ref 22–32)
Calcium: 6.9 mg/dL — ABNORMAL LOW (ref 8.9–10.3)
Chloride: 106 mmol/L (ref 101–111)
Creatinine, Ser: 1.11 mg/dL — ABNORMAL HIGH (ref 0.44–1.00)
GFR calc Af Amer: >60 mL/min
GFR calc non Af Amer: 56 mL/min — ABNORMAL LOW
Glucose, Bld: 121 mg/dL — ABNORMAL HIGH (ref 65–99)
Potassium: 3.2 mmol/L — ABNORMAL LOW (ref 3.5–5.1)
Sodium: 138 mmol/L (ref 135–145)

## 2017-03-16 LAB — CBC
HCT: 23.3 % — ABNORMAL LOW (ref 36.0–46.0)
Hemoglobin: 7.8 g/dL — ABNORMAL LOW (ref 12.0–15.0)
MCH: 28.6 pg (ref 26.0–34.0)
MCHC: 33.5 g/dL (ref 30.0–36.0)
MCV: 85.3 fL (ref 78.0–100.0)
PLATELETS: 22 10*3/uL — AB (ref 150–400)
RBC: 2.73 MIL/uL — ABNORMAL LOW (ref 3.87–5.11)
RDW: 14.4 % (ref 11.5–15.5)
WBC: 0.1 10*3/uL — CL (ref 4.0–10.5)

## 2017-03-16 LAB — VANCOMYCIN, TROUGH: Vancomycin Tr: 21 ug/mL (ref 15–20)

## 2017-03-16 MED ORDER — SCOPOLAMINE 1 MG/3DAYS TD PT72
1.0000 | MEDICATED_PATCH | TRANSDERMAL | Status: DC
  Administered 2017-03-16: 1.5 mg via TRANSDERMAL
  Filled 2017-03-16: qty 1

## 2017-03-16 MED ORDER — POTASSIUM CHLORIDE CRYS ER 20 MEQ PO TBCR
40.0000 meq | EXTENDED_RELEASE_TABLET | Freq: Two times a day (BID) | ORAL | Status: AC
  Administered 2017-03-16 (×2): 40 meq via ORAL
  Filled 2017-03-16 (×2): qty 2

## 2017-03-16 MED ORDER — VANCOMYCIN HCL 10 G IV SOLR
1250.0000 mg | INTRAVENOUS | Status: DC
  Administered 2017-03-17 – 2017-03-18 (×2): 1250 mg via INTRAVENOUS
  Filled 2017-03-16 (×2): qty 1250

## 2017-03-16 MED ORDER — PREMIER PROTEIN SHAKE
2.0000 [oz_av] | Freq: Four times a day (QID) | ORAL | Status: DC
  Administered 2017-03-16 – 2017-03-17 (×3): 2 [oz_av] via ORAL
  Filled 2017-03-16 (×11): qty 325.31

## 2017-03-16 MED ORDER — DEXTROSE 5 % IV SOLN
2.0000 g | Freq: Three times a day (TID) | INTRAVENOUS | Status: DC
  Administered 2017-03-16 – 2017-03-18 (×5): 2 g via INTRAVENOUS
  Filled 2017-03-16 (×7): qty 2

## 2017-03-16 NOTE — Progress Notes (Signed)
CRITICAL VALUE ALERT  Critical value received:  Platelets 22  WBC 0.1  Date of notification:  03/16/2017  Time of notification:  0530  Critical value read back:Yes.    Nurse who received alert:  J.Roosevelt Locks, RN  MD notified (1st page):  Jonette Eva, NP   Time of first page:  [phone]  Will continue to monitor patient

## 2017-03-16 NOTE — Progress Notes (Signed)
PROGRESS NOTE    Abigail Yoder  YJE:[id number] DOB: [birth year] DOA: 03/13/2017 PCP: [name], MD    Brief Narrative:  51 y.o. female with medical history significant of Breast cancer (s/p of L lumpectomy, currently on chemotherapy), hypertension, diabetes mellitus, pancytopenia, anxiety, who presents with fever and generalized weakness.  Patient states she was seen at sickle cell clinic this am and received 1L of NS fluids. When she got home and began feeling weak. Pt checked temp and had fever. Pt has mild dry cough, but no shortness breath or chest pain. She had runny nose 2 days ago, which has resolved. No sore throat. Denies symptoms of UTI. No nausea, vomiting, diarrhea or abdominal pain. Pt had chemotherapy and blood transfusion last week.  Assessment & Plan:   Principal Problem:   Neutropenic fever ([place]) Active Problems:   Type 2 diabetes mellitus (HCC)   Essential hypertension   Malignant neoplasm of upper-inner quadrant of left breast in female, estrogen receptor positive (HCC)   Antineoplastic chemotherapy induced pancytopenia (HCC)   Sepsis (HCC)   Neutropenia with fever (HCC)  Neutropenic fever and sepsis:  - Blood cx neg x2 - Recent CXR is clear - UA unremarkable - Patient had been continued on empiric vanc and zosyn - Will transition zosyn to cefepime - Appreciate input by Oncology. Chart reviewed. Oncology recs for transfusion as planned. Neupogen not recommended as neulasta was given on 4/19 - Repeat cbc in AM  DM-II: Last A1c 7.8 on 02/11/17, not well controled.  - Patient has been taking mixed insulin 70/30 at home - insulin 70/30 dose decreased from 20 to 15 units twice a day  - Will continue SSI as needed  HTN: - Will continue amlodipine as tolerated  Anxiety: -Plan to continue home medications: - Plan to continue PRN Ativan  Breast cancer:  - Pt is s/p of L lumpectomy and currently on Chemotherapy -Recommendations by Dr. Jana Hakim  noted  Antineoplastic chemotherapy induced pancytopenia ([place]): - Hgb 6 on 4/26 - 2 units prbcs were transfused - hgb stable - Repeat CBC in AM  DVT prophylaxis: SCD's Code Status: Full Family Communication: Pt in room, family not at bedside Disposition Plan: Uncertain at this time  Consultants:   Oncology  Procedures:     Antimicrobials: Anti-infectives    Start     Dose/Rate Route Frequency Ordered Stop   03/17/17 1000  vancomycin (VANCOCIN) 1,250 mg in sodium chloride 0.9 % 250 mL IVPB     1,250 mg 166.7 mL/hr over 90 Minutes Intravenous Every 24 hours 03/16/17 1051     03/16/17 2200  ceFEPIme (MAXIPIME) 2 g in dextrose 5 % 50 mL IVPB     2 g 100 mL/hr over 30 Minutes Intravenous Every 8 hours 03/16/17 1618     03/14/17 1000  vancomycin (VANCOCIN) 1,250 mg in sodium chloride 0.9 % 250 mL IVPB  Status:  Discontinued     1,250 mg 166.7 mL/hr over 90 Minutes Intravenous Every 12 hours 03/14/17 0514 03/16/17 1051   03/14/17 0600  piperacillin-tazobactam (ZOSYN) IVPB 3.375 g  Status:  Discontinued     3.375 g 12.5 mL/hr over 240 Minutes Intravenous Every 8 hours 03/14/17 0514 03/16/17 1612   03/13/17 2245  oseltamivir (TAMIFLU) capsule 75 mg     75 mg Oral Once 03/13/17 2243     03/13/17 2230  piperacillin-tazobactam (ZOSYN) IVPB 3.375 g     3.375 g 100 mL/hr over 30 Minutes Intravenous  Once 03/13/17 2221 03/13/17 2311  03/13/17 2230  vancomycin (VANCOCIN) IVPB 1000 mg/200 mL premix     1,000 mg 200 mL/hr over 60 Minutes Intravenous  Once 03/13/17 2221 03/13/17 2340      Subjective: Reporting intermittent diarrhea that began overnight through this AM  Objective: Vitals:   03/16/17 0508 03/16/17 1243 03/16/17 1343 03/16/17 1443  BP: (!) 128/109  140/68   Pulse: 95  (!) 102   Resp: 18  18   Temp: (!) 102.5 F (39.2 C) (!) 100.5 F (38.1 C) (!) 102.8 F (39.3 C) (!) 100.5 F (38.1 C)  TempSrc: Oral  Oral   SpO2: 100%  100%   Weight:      Height:         Intake/Output Summary (Last 24 hours) at 03/16/17 1624 Last data filed at 03/16/17 1500  Gross per 24 hour  Intake          2503.33 ml  Output                0 ml  Net          2503.33 ml   Filed Weights   03/13/17 1911 03/14/17 0133  Weight: 98.4 kg (217 lb) 98.6 kg (217 lb 6 oz)    Examination:  General exam: awake, laying in bed, in nad Respiratory system: normal chest rise, no audible wheezing Cardiovascular system: regular rhythm, s1, s2 Gastrointestinal system: nontender, pos BS, nondistended Central nervous system: no seizures, no tremors Extremities: no cyanosis, no joint deformities Skin: no rashes, no pallor Psychiatry: affect normal// no auditory hallucinations  Data Reviewed: I have personally reviewed following labs and imaging studies  CBC:  Recent Labs Lab 03/13/17 1957 03/14/17 0455 03/15/17 0500 03/16/17 0322  WBC 0.2* 0.1* 0.1* 0.1*  NEUTROABS 0.0*  --   --   --   HGB 7.0* 6.0* 7.8* 7.8*  HCT 20.4* 17.5* 22.2* 23.3*  MCV 85.4 83.3 84.4 85.3  PLT 89* 60* 40* 22*   Basic Metabolic Panel:  Recent Labs Lab 03/13/17 1957 03/14/17 0455 03/15/17 0500 03/16/17 0322  NA 138 137  --  138  K 4.1 3.9  --  3.2*  CL 104 105  --  106  CO2 27 26  --  23  GLUCOSE 135* 175*  --  121*  BUN 19 14  --  9  CREATININE 1.03* 1.04* 1.07* 1.11*  CALCIUM 7.7* 7.2*  --  6.9*   GFR: Estimated Creatinine Clearance: 67.8 mL/min (A) (by C-G formula based on SCr of 1.11 mg/dL (H)). Liver Function Tests:  Recent Labs Lab 03/13/17 1957  AST 17  ALT 17  ALKPHOS 69  BILITOT 0.2*  PROT 6.0*  ALBUMIN 2.7*   No results for input(s): LIPASE, AMYLASE in the last 168 hours. No results for input(s): AMMONIA in the last 168 hours. Coagulation Profile:  Recent Labs Lab 03/14/17 1027  INR 1.14   Cardiac Enzymes: No results for input(s): CKTOTAL, CKMB, CKMBINDEX, TROPONINI in the last 168 hours. BNP (last 3 results) No results for input(s): PROBNP in the  last 8760 hours. HbA1C: No results for input(s): HGBA1C in the last 72 hours. CBG:  Recent Labs Lab 03/14/17 1150 03/14/17 1653 03/14/17 2125 03/15/17 0813 03/15/17 1209  GLUCAP 76 186* 81 96 120*   Lipid Profile: No results for input(s): CHOL, HDL, LDLCALC, TRIG, CHOLHDL, LDLDIRECT in the last 72 hours. Thyroid Function Tests: No results for input(s): TSH, T4TOTAL, FREET4, T3FREE, THYROIDAB in the last 72 hours. Anemia Panel: No  results for input(s): VITAMINB12, FOLATE, FERRITIN, TIBC, IRON, RETICCTPCT in the last 72 hours. Sepsis Labs:  Recent Labs Lab 03/13/17 2009 03/14/17 0102 03/14/17 0455  PROCALCITON  --  0.18  --   LATICACIDVEN 0.85 1.1 1.0    Recent Results (from the past 240 hour(s))  Blood Culture (routine x 2)     Status: None (Preliminary result)   Collection Time: 03/13/17 10:21 PM  Result Value Ref Range Status   Specimen Description BLOOD PORT  Final   Special Requests   Final    BOTTLES DRAWN AEROBIC AND ANAEROBIC Blood Culture adequate volume   Culture   Final    NO GROWTH 3 DAYS Performed at [facility], [address] [place], [place] [address]    Report Status PENDING  Incomplete  Blood Culture (routine x 2)     Status: None (Preliminary result)   Collection Time: 03/13/17 10:37 PM  Result Value Ref Range Status   Specimen Description BLOOD RIGHT ANTECUBITAL  Final   Special Requests   Final    BOTTLES DRAWN AEROBIC AND ANAEROBIC Blood Culture adequate volume   Culture   Final    NO GROWTH 3 DAYS Performed at [facility], [place] [address] [place], [place] [address]    Report Status PENDING  Incomplete  Urine culture     Status: Abnormal   Collection Time: 03/13/17 11:03 PM  Result Value Ref Range Status   Specimen Description URINE, RANDOM  Final   Special Requests NONE  Final   Culture MULTIPLE SPECIES PRESENT, SUGGEST RECOLLECTION (A)  Final   Report Status 03/15/2017 FINAL  Final     Radiology Studies: No results  found.  Scheduled Meds: . amLODipine  5 mg Oral Daily  . insulin aspart  0-9 Units Subcutaneous TID WC  . insulin aspart protamine- aspart  15 Units Subcutaneous BID WC  . oseltamivir  75 mg Oral Once  . potassium chloride  40 mEq Oral BID  . protein supplement shake  2 oz Oral QID  . saccharomyces boulardii  250 mg Oral BID  . scopolamine  1 patch Transdermal Q72H  . sodium chloride flush  3 mL Intravenous Q12H   Continuous Infusions: . sodium chloride 100 mL/hr at 03/16/17 0428  . sodium chloride    . ceFEPime (MAXIPIME) IV    . sodium chloride    . [START ON 03/17/2017] vancomycin       LOS: 3 days   CHIU, Orpah Melter, MD [facility] Pager [phone]  If 7PM-7AM, please contact night-coverage [email or web address] Password [facility] 03/16/2017, 4:24 PM

## 2017-03-16 NOTE — Progress Notes (Addendum)
CRITICAL VALUE ALERT  Critical value received:  Vancomycin level 21  Date of notification:  03/16/17  Time of notification:  0813  Critical value read back:Yes.    Nurse who received alert: Star Age  MD notified (1st page):  Peggyann Juba [facility] / Dr. Wyline Copas  Time of first page:[phone]  MD notified (2nd page):  Time of second page:  Responding MD:  Peggyann Juba RPH/ Dr. Wyline Copas  Time MD responded:  [phone]

## 2017-03-16 NOTE — Progress Notes (Addendum)
Pharmacy Antibiotic Note  Abigail Yoder is a 51 y.o. female admitted on 03/13/2017 with sepsis.  Pharmacy has been consulted for Vancomycin and Zosyn dosing.  Today, 03/16/2017 Day #3 vancomycin/zosyn Remains febrile WBC 0.1 SCr 1.11, trending up CrCl 67 ml/min Vancomycin trough 21, elevated  Plan:  Decrease vancomycin to 1250mg  IV q24h  Zosyn 3.375gm IV q8h (4hr extended infusions) - recommend changing to cefepime to reduce risk of nephrotoxicity in combination with vancomycin  Follow up renal function & cultures  Recheck vancomycin trough as needed  Height: 5' 3.5" (161.3 cm) Weight: 217 lb 6 oz (98.6 kg) IBW/kg (Calculated) : 53.55  Temp (24hrs), Avg:101.9 F (38.8 C), Min:100.4 F (38 C), Max:102.6 F (39.2 C)   Recent Labs Lab 03/13/17 1957 03/13/17 2009 03/14/17 0102 03/14/17 0455 03/15/17 0500 03/16/17 0322 03/16/17 0943  WBC 0.2*  --   --  0.1* 0.1* 0.1*  --   CREATININE 1.03*  --   --  1.04* 1.07* 1.11*  --   LATICACIDVEN  --  0.85 1.1 1.0  --   --   --   VANCOTROUGH  --   --   --   --   --   --  21*    Estimated Creatinine Clearance: 67.8 mL/min (A) (by C-G formula based on SCr of 1.11 mg/dL (H)).    Allergies  Allergen Reactions  . Pineapple Anaphylaxis and Hives    Antimicrobials this admission:  4/26 Vanc >>> 4/26 Zosyn >>  Dose adjustments this admission:  4/28 VT at 0930 = 21 on 1250mg  q12h (SCr rising) - decrease q24h  Microbiology results:  4/26 BCx: ngtd 4/26 UCx: multiple species (final) 4/26 influenza neg  Thank you for allowing pharmacy to be a part of this patient's care.  Peggyann Juba, PharmD, BCPS Pager: [phone] 03/16/2017 10:51 AM   Addendum:  [name] to change Zosyn to Cefepime - 2g IV q8h with CrCl > 50 ml/min Continue to follow renal function and adjust as needed . Peggyann Juba, PharmD, BCPS Pager: [phone] 03/16/2017 4:20 PM

## 2017-03-17 LAB — CBC
HEMATOCRIT: 21.5 % — AB (ref 36.0–46.0)
Hemoglobin: 7.2 g/dL — ABNORMAL LOW (ref 12.0–15.0)
MCH: 28.8 pg (ref 26.0–34.0)
MCHC: 33.5 g/dL (ref 30.0–36.0)
MCV: 86 fL (ref 78.0–100.0)
PLATELETS: 18 10*3/uL — AB (ref 150–400)
RBC: 2.5 MIL/uL — ABNORMAL LOW (ref 3.87–5.11)
RDW: 14.3 % (ref 11.5–15.5)
WBC: 0.4 10*3/uL — CL (ref 4.0–10.5)

## 2017-03-17 LAB — DIFFERENTIAL
BASOS ABS: 0 10*3/uL (ref 0.0–0.1)
Basophils Relative: 0 %
EOS ABS: 0 10*3/uL (ref 0.0–0.7)
Eosinophils Relative: 0 %
LYMPHS ABS: 0.1 10*3/uL — AB (ref 0.7–4.0)
LYMPHS PCT: 30 %
MONOS PCT: 8 %
Monocytes Absolute: 0 10*3/uL — ABNORMAL LOW (ref 0.1–1.0)
NEUTROS PCT: 62 %
Neutro Abs: 0.3 10*3/uL — ABNORMAL LOW (ref 1.7–7.7)

## 2017-03-17 LAB — BASIC METABOLIC PANEL
Anion gap: 7 (ref 5–15)
BUN: 10 mg/dL (ref 6–20)
CALCIUM: 6.8 mg/dL — AB (ref 8.9–10.3)
CO2: 22 mmol/L (ref 22–32)
Chloride: 110 mmol/L (ref 101–111)
Creatinine, Ser: 1.05 mg/dL — ABNORMAL HIGH (ref 0.44–1.00)
GFR calc Af Amer: >60 mL/min (ref >60)
GLUCOSE: 135 mg/dL — AB (ref 65–99)
POTASSIUM: 3.6 mmol/L (ref 3.5–5.1)
Sodium: 139 mmol/L (ref 135–145)

## 2017-03-17 MED ORDER — LIDOCAINE VISCOUS 2 % MT SOLN
15.0000 mL | OROMUCOSAL | Status: DC | PRN
  Filled 2017-03-17: qty 15

## 2017-03-17 MED ORDER — NYSTATIN 100000 UNIT/ML MT SUSP
5.0000 mL | Freq: Four times a day (QID) | OROMUCOSAL | Status: DC
  Administered 2017-03-17 – 2017-03-18 (×4): 500000 [IU] via OROMUCOSAL
  Filled 2017-03-17 (×5): qty 5

## 2017-03-17 MED ORDER — GLYCOPYRROLATE 1 MG PO TABS
1.0000 mg | ORAL_TABLET | Freq: Three times a day (TID) | ORAL | Status: DC | PRN
  Administered 2017-03-17: 1 mg via ORAL
  Filled 2017-03-17 (×2): qty 1

## 2017-03-17 NOTE — Progress Notes (Signed)
PROGRESS NOTE    Abigail Yoder  MBT:[id number] DOB: Jan 24, 1966 DOA: 03/13/2017 PCP: [name], MD    Brief Narrative:  51 y.o. female with medical history significant of Breast cancer (s/p of L lumpectomy, currently on chemotherapy), hypertension, diabetes mellitus, pancytopenia, anxiety, who presents with fever and generalized weakness.  Patient states she was seen at sickle cell clinic this am and received 1L of NS fluids. When she got home and began feeling weak. Pt checked temp and had fever. Pt has mild dry cough, but no shortness breath or chest pain. She had runny nose 2 days ago, which has resolved. No sore throat. Denies symptoms of UTI. No nausea, vomiting, diarrhea or abdominal pain. Pt had chemotherapy and blood transfusion last week.  Assessment & Plan:   Principal Problem:   Neutropenic fever ([place]) Active Problems:   Type 2 diabetes mellitus (HCC)   Essential hypertension   Malignant neoplasm of upper-inner quadrant of left breast in female, estrogen receptor positive (HCC)   Antineoplastic chemotherapy induced pancytopenia (HCC)   Sepsis ([place])   Neutropenia with fever (HCC)  Neutropenic fever and sepsis:  - Blood cx neg x2 - Recent CXR is clear - UA noted to be unremarkable - Patient had been continued on empiric vanc and zosyn - Have transitioned zosyn to cefepime - Appreciate input by Oncology. Chart reviewed. Oncology recs for transfusion as planned. Neupogen not recommended as neulasta was given on 4/19 - Repeat cbc in AM  DM-II:  - Last A1c 7.8 on 02/11/17, not well controled.  - Patient has been taking mixed insulin 70/30 at home - insulin 70/30 dose was decreased from 20 to 15 units twice a day  - Will continue with SSI as needed  HTN: - Plan to continue amlodipine as tolerated  Anxiety: -Plan to continue home medications: - Will continue PRN Ativan  Breast cancer:  - Pt is s/p of L lumpectomy and currently on Chemotherapy - Recommendations  noted  by Dr. Jana Hakim  Antineoplastic chemotherapy induced pancytopenia [facility]): - Hgb 6 on 4/26 - 2 units prbcs were transfused - hgb thus far - Will recheck CBC in AM  Sore throat - New problem - no acute pathology within mouth on bedside exam - Given neutropenia, concerns for possible thrush/esophageal candidiasis - Will order trial of oral nystatin - Will order viscous lidocaine PRN sore throat  DVT prophylaxis: SCD's Code Status: Full Family Communication: Pt in room, family not at bedside Disposition Plan: Uncertain at this time  Consultants:   Oncology  Procedures:     Antimicrobials: Anti-infectives    Start     Dose/Rate Route Frequency Ordered Stop   03/17/17 1000  vancomycin (VANCOCIN) 1,250 mg in sodium chloride 0.9 % 250 mL IVPB     1,250 mg 166.7 mL/hr over 90 Minutes Intravenous Every 24 hours 03/16/17 1051     03/16/17 2200  ceFEPIme (MAXIPIME) 2 g in dextrose 5 % 50 mL IVPB     2 g 100 mL/hr over 30 Minutes Intravenous Every 8 hours 03/16/17 1618     03/14/17 1000  vancomycin (VANCOCIN) 1,250 mg in sodium chloride 0.9 % 250 mL IVPB  Status:  Discontinued     1,250 mg 166.7 mL/hr over 90 Minutes Intravenous Every 12 hours 03/14/17 0514 03/16/17 1051   03/14/17 0600  piperacillin-tazobactam (ZOSYN) IVPB 3.375 g  Status:  Discontinued     3.375 g 12.5 mL/hr over 240 Minutes Intravenous Every 8 hours 03/14/17 0514 03/16/17 1612  03/13/17 2245  oseltamivir (TAMIFLU) capsule 75 mg     75 mg Oral Once 03/13/17 2243     03/13/17 2230  piperacillin-tazobactam (ZOSYN) IVPB 3.375 g     3.375 g 100 mL/hr over 30 Minutes Intravenous  Once 03/13/17 2221 03/13/17 2311   03/13/17 2230  vancomycin (VANCOCIN) IVPB 1000 mg/200 mL premix     1,000 mg 200 mL/hr over 60 Minutes Intravenous  Once 03/13/17 2221 03/13/17 2340      Subjective: Complaining of sore throat, resultant difficulty swallowing  Objective: Vitals:   03/16/17 1935 03/16/17 2119 03/17/17 0500  03/17/17 1342  BP:  131/66 135/73 (!) 155/80  Pulse:  87 91 80  Resp:  20 (!) 24 20  Temp: (!) 102.6 F (39.2 C) (!) 100.4 F (38 C) (!) 102.4 F (39.1 C) 100 F (37.8 C)  TempSrc:  Oral Oral Oral  SpO2:  98% 100% 99%  Weight:      Height:        Intake/Output Summary (Last 24 hours) at 03/17/17 1403 Last data filed at 03/17/17 0600  Gross per 24 hour  Intake          3103.33 ml  Output                0 ml  Net          3103.33 ml   Filed Weights   03/13/17 1911 03/14/17 0133  Weight: 98.4 kg (217 lb) 98.6 kg (217 lb 6 oz)    Examination:  General exam: conversant, sitting in chair, in nad Respiratory system: normal resp effort, no audible wheezing Cardiovascular system: regular rate, s1, s2 on auscultation Gastrointestinal system: soft, nondistended, pos BS Central nervous system: cn2-12 grossly intact, strength intact Extremities: no clubbing, perfused Skin: normal skin turgor, no notable skin lesions seen Psychiatry: mood normal// no visual hallucinations  Data Reviewed: I have personally reviewed following labs and imaging studies  CBC:  Recent Labs Lab 03/13/17 1957 03/14/17 0455 03/15/17 0500 03/16/17 0322 03/17/17 0408  WBC 0.2* 0.1* 0.1* 0.1* 0.4*  NEUTROABS 0.0*  --   --   --  0.3*  HGB 7.0* 6.0* 7.8* 7.8* 7.2*  HCT 20.4* 17.5* 22.2* 23.3* 21.5*  MCV 85.4 83.3 84.4 85.3 86.0  PLT 89* 60* 40* 22* 18*   Basic Metabolic Panel:  Recent Labs Lab 03/13/17 1957 03/14/17 0455 03/15/17 0500 03/16/17 0322 03/17/17 0408  NA 138 137  --  138 139  K 4.1 3.9  --  3.2* 3.6  CL 104 105  --  106 110  CO2 27 26  --  23 22  GLUCOSE 135* 175*  --  121* 135*  BUN 19 14  --  9 10  CREATININE 1.03* 1.04* 1.07* 1.11* 1.05*  CALCIUM 7.7* 7.2*  --  6.9* 6.8*   GFR: Estimated Creatinine Clearance: 71.6 mL/min (A) (by C-G formula based on SCr of 1.05 mg/dL (H)). Liver Function Tests:  Recent Labs Lab 03/13/17 1957  AST 17  ALT 17  ALKPHOS 69  BILITOT  0.2*  PROT 6.0*  ALBUMIN 2.7*   No results for input(s): LIPASE, AMYLASE in the last 168 hours. No results for input(s): AMMONIA in the last 168 hours. Coagulation Profile:  Recent Labs Lab 03/14/17 1027  INR 1.14   Cardiac Enzymes: No results for input(s): CKTOTAL, CKMB, CKMBINDEX, TROPONINI in the last 168 hours. BNP (last 3 results) No results for input(s): PROBNP in the last 8760 hours. HbA1C: No  results for input(s): HGBA1C in the last 72 hours. CBG:  Recent Labs Lab 03/14/17 1150 03/14/17 1653 03/14/17 2125 03/15/17 0813 03/15/17 1209  GLUCAP 76 186* 81 96 120*   Lipid Profile: No results for input(s): CHOL, HDL, LDLCALC, TRIG, CHOLHDL, LDLDIRECT in the last 72 hours. Thyroid Function Tests: No results for input(s): TSH, T4TOTAL, FREET4, T3FREE, THYROIDAB in the last 72 hours. Anemia Panel: No results for input(s): VITAMINB12, FOLATE, FERRITIN, TIBC, IRON, RETICCTPCT in the last 72 hours. Sepsis Labs:  Recent Labs Lab 03/13/17 2009 03/14/17 0102 03/14/17 0455  PROCALCITON  --  0.18  --   LATICACIDVEN 0.85 1.1 1.0    Recent Results (from the past 240 hour(s))  Blood Culture (routine x 2)     Status: None (Preliminary result)   Collection Time: 03/13/17 10:21 PM  Result Value Ref Range Status   Specimen Description BLOOD PORT  Final   Special Requests   Final    BOTTLES DRAWN AEROBIC AND ANAEROBIC Blood Culture adequate volume   Culture   Final    NO GROWTH 4 DAYS Performed at [facility], [address], [place], [place] [address]    Report Status PENDING  Incomplete  Blood Culture (routine x 2)     Status: None (Preliminary result)   Collection Time: 03/13/17 10:37 PM  Result Value Ref Range Status   Specimen Description BLOOD RIGHT ANTECUBITAL  Final   Special Requests   Final    BOTTLES DRAWN AEROBIC AND ANAEROBIC Blood Culture adequate volume   Culture   Final    NO GROWTH 4 DAYS Performed at [facility], [place] [address]  [place], [place] [address]    Report Status PENDING  Incomplete  Urine culture     Status: Abnormal   Collection Time: 03/13/17 11:03 PM  Result Value Ref Range Status   Specimen Description URINE, RANDOM  Final   Special Requests NONE  Final   Culture MULTIPLE SPECIES PRESENT, SUGGEST RECOLLECTION (A)  Final   Report Status 03/15/2017 FINAL  Final     Radiology Studies: No results found.  Scheduled Meds: . amLODipine  5 mg Oral Daily  . insulin aspart  0-9 Units Subcutaneous TID WC  . insulin aspart protamine- aspart  15 Units Subcutaneous BID WC  . nystatin  5 mL Mouth/Throat QID  . oseltamivir  75 mg Oral Once  . protein supplement shake  2 oz Oral QID  . saccharomyces boulardii  250 mg Oral BID  . scopolamine  1 patch Transdermal Q72H  . sodium chloride flush  3 mL Intravenous Q12H   Continuous Infusions: . sodium chloride 100 mL/hr at 03/16/17 2125  . sodium chloride    . ceFEPime (MAXIPIME) IV Stopped (03/17/17 3559)  . sodium chloride    . vancomycin Stopped (03/17/17 1030)     LOS: 4 days   Elishua Radford, Orpah Melter, MD [facility] Pager [phone]  If 7PM-7AM, please contact night-coverage [email or web address] Password [email or web address] 03/17/2017, 2:03 PM

## 2017-03-18 DIAGNOSIS — K1230 Oral mucositis (ulcerative), unspecified: Secondary | ICD-10-CM

## 2017-03-18 DIAGNOSIS — R5383 Other fatigue: Secondary | ICD-10-CM

## 2017-03-18 LAB — TYPE AND SCREEN
ABO/RH(D): O POS
Antibody Screen: NEGATIVE
Unit division: 0
Unit division: 0

## 2017-03-18 LAB — GLUCOSE, CAPILLARY
GLUCOSE-CAPILLARY: 93 mg/dL (ref 65–99)
GLUCOSE-CAPILLARY: 110 mg/dL — AB (ref 65–99)
GLUCOSE-CAPILLARY: 126 mg/dL — AB (ref 65–99)
GLUCOSE-CAPILLARY: 142 mg/dL — AB (ref 65–99)
GLUCOSE-CAPILLARY: 106 mg/dL — AB (ref 65–99)
Glucose-Capillary: 157 mg/dL — ABNORMAL HIGH (ref 65–99)
Glucose-Capillary: 109 mg/dL — ABNORMAL HIGH (ref 65–99)
Glucose-Capillary: 134 mg/dL — ABNORMAL HIGH (ref 65–99)
Glucose-Capillary: 125 mg/dL — ABNORMAL HIGH (ref 65–99)
Glucose-Capillary: 104 mg/dL — ABNORMAL HIGH (ref 65–99)
Glucose-Capillary: 128 mg/dL — ABNORMAL HIGH (ref 65–99)
Glucose-Capillary: 135 mg/dL — ABNORMAL HIGH (ref 65–99)
Glucose-Capillary: 102 mg/dL — ABNORMAL HIGH (ref 65–99)
Glucose-Capillary: 94 mg/dL (ref 65–99)

## 2017-03-18 LAB — BASIC METABOLIC PANEL
Anion gap: 7 (ref 5–15)
BUN: 10 mg/dL (ref 6–20)
CHLORIDE: 111 mmol/L (ref 101–111)
CO2: 23 mmol/L (ref 22–32)
CREATININE: 0.93 mg/dL (ref 0.44–1.00)
Calcium: 7 mg/dL — ABNORMAL LOW (ref 8.9–10.3)
GFR calc Af Amer: >60 mL/min (ref >60)
GFR calc non Af Amer: >60 mL/min (ref >60)
Glucose, Bld: 107 mg/dL — ABNORMAL HIGH (ref 65–99)
Potassium: 3.2 mmol/L — ABNORMAL LOW (ref 3.5–5.1)
SODIUM: 141 mmol/L (ref 135–145)

## 2017-03-18 LAB — CULTURE, BLOOD (ROUTINE X 2)
CULTURE: NO GROWTH
CULTURE: NO GROWTH
SPECIAL REQUESTS: ADEQUATE
Special Requests: ADEQUATE

## 2017-03-18 LAB — CBC
HEMATOCRIT: 20.7 % — AB (ref 36.0–46.0)
HEMOGLOBIN: 7.1 g/dL — AB (ref 12.0–15.0)
MCH: 29.5 pg (ref 26.0–34.0)
MCHC: 34.3 g/dL (ref 30.0–36.0)
MCV: 85.9 fL (ref 78.0–100.0)
Platelets: 27 10*3/uL — CL (ref 150–400)
RBC: 2.41 MIL/uL — ABNORMAL LOW (ref 3.87–5.11)
RDW: 14.7 % (ref 11.5–15.5)
WBC: 1.3 10*3/uL — CL (ref 4.0–10.5)

## 2017-03-18 LAB — BPAM RBC
BLOOD PRODUCT EXPIRATION DATE: 201805202359
BLOOD PRODUCT EXPIRATION DATE: 201805202359
ISSUE DATE / TIME: 201804261445
ISSUE DATE / TIME: 201804270207
UNIT TYPE AND RH: 5100
UNIT TYPE AND RH: 5100

## 2017-03-18 MED ORDER — GLYCOPYRROLATE 1 MG PO TABS: 1.0000 mg | ORAL_TABLET | Freq: Three times a day (TID) | ORAL | 0 refills | Status: DC | PRN

## 2017-03-18 MED ORDER — SACCHAROMYCES BOULARDII 250 MG PO CAPS: 250.0000 mg | ORAL_CAPSULE | Freq: Two times a day (BID) | ORAL | 0 refills | Status: AC

## 2017-03-18 MED ORDER — LEVOFLOXACIN 750 MG PO TABS: 750.0000 mg | ORAL_TABLET | Freq: Every day | ORAL | 0 refills | Status: DC

## 2017-03-18 MED ORDER — POTASSIUM CHLORIDE CRYS ER 20 MEQ PO TBCR
40.0000 meq | EXTENDED_RELEASE_TABLET | Freq: Two times a day (BID) | ORAL | Status: DC
  Administered 2017-03-18: 40 meq via ORAL
  Filled 2017-03-18: qty 2

## 2017-03-18 MED ORDER — NYSTATIN 100000 UNIT/ML MT SUSP: 5.0000 mL | Freq: Four times a day (QID) | OROMUCOSAL | 0 refills | Status: DC

## 2017-03-18 MED ORDER — HEPARIN SOD (PORK) LOCK FLUSH 100 UNIT/ML IV SOLN
500.0000 [IU] | INTRAVENOUS | Status: AC | PRN
  Administered 2017-03-18: 500 [IU]

## 2017-03-18 NOTE — Progress Notes (Signed)
Abigail Yoder   DOB:1966-02-14   MV#:[id number]   NPY#:[id number]  Subjective:  Abigail Yoder has had a rough past few days, but she is getting better. She had significant mucositis, fatigue, and she has had a couple of blisters on her skin where she has a dressing for the port. She is very eager to get home, and has been sitting up up to 10 hours a day in the recliner, but not taking walks in the hallway yet. She is eating better. She feels "I could not go through another chemotherapy like this".    Objective: middle aged African American woman examined in a recliner Vitals:   03/18/17 0454 03/18/17 1009  BP: (!) 143/70 (!) 151/72  Pulse: 77   Resp: (!) 22   Temp: 99.8 F (37.7 C)     Body mass index is 37.9 kg/m.  Intake/Output Summary (Last 24 hours) at 03/18/17 1126 Last data filed at 03/18/17 0200  Gross per 24 hour  Intake             2400 ml  Output                0 ml  Net             2400 ml    Sclerae unicteric, EOMs intact Oropharynx shows no thrush No cervical or supraclavicular adenopathy Lungs no rales or rhonchi Heart regular rate and rhythm Abd soft, nontender, positive bowel sounds Neuro: nonfocal, well oriented, appropriate affect Breasts: Deferred  CBG (last 3)   Recent Labs  03/17/17 1656 03/17/17 2037 03/18/17 0753  GLUCAP 128* 135* 102*     Labs:  Lab Results  Component Value Date   WBC 1.3 (LL) 03/18/2017   HGB 7.1 (L) 03/18/2017   HCT 20.7 (L) 03/18/2017   MCV 85.9 03/18/2017   PLT 27 (LL) 03/18/2017   NEUTROABS 0.3 (L) 03/17/2017    '@LASTCHEMISTRY' @  Urine Studies No results for input(s): UHGB, CRYS in the last 72 hours.  Invalid input(s): UACOL, UAPR, USPG, UPH, UTP, UGL, UKET, UBIL, UNIT, UROB, [place], UEPI, UWBC, [employer], [place], [place], [employer], [place]  Basic Metabolic Panel:  Recent Labs Lab 03/13/17 1957 03/14/17 0455 03/15/17 0500 03/16/17 0322 03/17/17 0408 03/18/17 0445  NA 138 137  --  138 139 141  K 4.1 3.9  --  3.2* 3.6 3.2*  CL  104 105  --  106 110 111  CO2 27 26  --  '23 22 23  ' GLUCOSE 135* 175*  --  121* 135* 107*  BUN 19 14  --  '9 10 10  ' CREATININE 1.03* 1.04* 1.07* 1.11* 1.05* 0.93  CALCIUM 7.7* 7.2*  --  6.9* 6.8* 7.0*   GFR Estimated Creatinine Clearance: 80.9 mL/min (by C-G formula based on SCr of 0.93 mg/dL). Liver Function Tests:  Recent Labs Lab 03/13/17 1957  AST 17  ALT 17  ALKPHOS 69  BILITOT 0.2*  PROT 6.0*  ALBUMIN 2.7*   No results for input(s): LIPASE, AMYLASE in the last 168 hours. No results for input(s): AMMONIA in the last 168 hours. Coagulation profile  Recent Labs Lab 03/14/17 1027  INR 1.14    CBC:  Recent Labs Lab 03/13/17 1957 03/14/17 0455 03/15/17 0500 03/16/17 0322 03/17/17 0408 03/18/17 0445  WBC 0.2* 0.1* 0.1* 0.1* 0.4* 1.3*  NEUTROABS 0.0*  --   --   --  0.3*  --   HGB 7.0* 6.0* 7.8* 7.8* 7.2* 7.1*  HCT 20.4* 17.5* 22.2* 23.3* 21.5*  20.7*  MCV 85.4 83.3 84.4 85.3 86.0 85.9  PLT 89* 60* 40* 22* 18* 27*   Cardiac Enzymes: No results for input(s): CKTOTAL, CKMB, CKMBINDEX, TROPONINI in the last 168 hours. BNP: Invalid input(s): POCBNP CBG:  Recent Labs Lab 03/17/17 1149 03/17/17 1541 03/17/17 1656 03/17/17 2037 03/18/17 0753  GLUCAP 104* 106* 128* 135* 102*   D-Dimer No results for input(s): DDIMER in the last 72 hours. Hgb A1c No results for input(s): HGBA1C in the last 72 hours. Lipid Profile No results for input(s): CHOL, HDL, LDLCALC, TRIG, CHOLHDL, LDLDIRECT in the last 72 hours. Thyroid function studies No results for input(s): TSH, T4TOTAL, T3FREE, THYROIDAB in the last 72 hours.  Invalid input(s): FREET3 Anemia work up No results for input(s): VITAMINB12, FOLATE, FERRITIN, TIBC, IRON, RETICCTPCT in the last 72 hours. Microbiology Recent Results (from the past 240 hour(s))  Blood Culture (routine x 2)     Status: None (Preliminary result)   Collection Time: 03/13/17 10:21 PM  Result Value Ref Range Status   Specimen  Description BLOOD PORT  Final   Special Requests   Final    BOTTLES DRAWN AEROBIC AND ANAEROBIC Blood Culture adequate volume   Culture   Final    NO GROWTH 4 DAYS Performed at [facility], [address], [place], [place] [address]    Report Status PENDING  Incomplete  Blood Culture (routine x 2)     Status: None (Preliminary result)   Collection Time: 03/13/17 10:37 PM  Result Value Ref Range Status   Specimen Description BLOOD RIGHT ANTECUBITAL  Final   Special Requests   Final    BOTTLES DRAWN AEROBIC AND ANAEROBIC Blood Culture adequate volume   Culture   Final    NO GROWTH 4 DAYS Performed at [facility], [place] [address], [place], [place] [address]    Report Status PENDING  Incomplete  Urine culture     Status: Abnormal   Collection Time: 03/13/17 11:03 PM  Result Value Ref Range Status   Specimen Description URINE, RANDOM  Final   Special Requests NONE  Final   Culture MULTIPLE SPECIES PRESENT, SUGGEST RECOLLECTION (A)  Final   Report Status 03/15/2017 FINAL  Final      Studies:  No results found.  Assessment: 51 y.o. [place] woman ad mitted with febrile neutropenia 03/13/2017, status post left breast upper inner quadrant biopsy 11/21/2016 for a clinical T1 cN0, stage IA invasive ductal carcinoma, grade 3, estrogen and progesterone receptor positive, HER-2 not amplified, with an MIB-1 of 90%, admitted 03/22 with febrile neutropenia day 8 cycle 2 of cyclphosphamide/ doxorubicin  (1) Oncotype DX Score of 60 predicts a risk of recurrence outside the breast of 34% if the patient's only systemic therapy is tamoxifen for 5 years. On a similar database this risk drops to about 12% if chemotherapy is also given  (2) genetics testing sent 11/28/2016; The Breast/GYN gene panel offered by [facility] includes sequencing and rearrangement analysis for the following 23 genes: ATM, BARD1, BRCA1, BRCA2, BRIP1, CDH1, CHEK2, EPCAM, FANCC, MLH1, MSH2, MSH6, MUTYH, NBN, NF1,  PALB2, PMS2, POLD1, PTEN, RAD51C, RAD51D, RECQL, and TP53. Genetic testing did not reveal a deleterious mutation, however it detected a Variant of Unknown Significance in the RAD51Dgene called [id number].   (3) status post left breast lumpectomy on 12/31/2016: invasive ductal carcinoma, 1.8cm, grade 3, margins negative, 1 SLN negative, ER+(80%), PR-(2%), Ki-67 90%, HER-2 negative (ratio 1.05). T1cN0  (4) chemotherapy consisting of cyclophosphamide and doxorubicin  in dose dense fashion 4 (started 01/17/17), to be followed by Abraxane weekly 12.   (5) adjuvant radiation to follow as appropriate.  (6) febrile neutropenia; CURRENTLY: day 12 cycle 4 of 4 planned cycles of doxorubicin and cyclophosphamide, chemo, day 11 neulasta,  Plan:  Amantha S white cell count is finally coming up. We do not have a differential from this morning, but I would guess her ANC is well above 500, probably in the 6-800 range.  It would not be unreasonable to observe her 1 more day, but she is very eager to get home. All her blood cultures remain negative. She is on her sixth day of intravenous antibiotics, and has been afebrile. Accordingly I think it would be acceptable for her to be discharged today, which is her strong preference. I would give her an additional 4 days of antibiotics, most likely a quinolone. She is aware of the need to call us if any new symptoms were to develop.  She is going to see me again in 03/22/2017. At that point I would let her likely set her up for her remaining chemotherapy, namely taxanes, to start May 10 or so. She is agreeable to this plan.  I greatly appreciate your help to this patient!    Chauncey Cruel, MD 03/18/2017  11:26 AM Medical Oncology and Hematology [facility] [address] [place], [place] [address] Tel. [phone]    Fax. [phone]

## 2017-03-18 NOTE — Discharge Summary (Signed)
Physician Discharge Summary  Abigail Yoder:[id number] DOB: [birth year] DOA: 03/13/2017  PCP: [name], MD  Admit date: 03/13/2017 Discharge date: 03/18/2017  Admitted From: Home Disposition:  Home  Recommendations for Outpatient Follow-up:  1. Follow up with PCP in 1-2 weeks 2. Follow up with Oncology as scheduled  Discharge Condition:Improved CODE STATUS:Full Diet recommendation: Regular   Brief/Interim Summary: 51 y.o.femalewith medical history significant of Breast cancer (s/p of L lumpectomy, currently on chemotherapy), hypertension, diabetes mellitus, pancytopenia, anxiety, who presents with fever and generalized weakness.  Patient states she was seen at [facility] clinic this am and received 1L of NS fluids. When she got home and began feeling weak. Pt checked temp and had fever. Pt has milddry cough, but no shortness breath or chest pain. She had runny nose 2 days ago, which has resolved. No sore throat. Denies symptoms of UTI. No nausea, vomiting, diarrhea or abdominal pain. Pt had chemotherapy and blood transfusion last week.  Neutropenic fever and sepsis: - Blood cx neg x2 - Recent CXR is clear - UA noted to be unremarkable - Patient had been continued on empiric vanc and zosyn - Later transitioned zosyn to cefepime - Appreciate input by Oncology. Chart reviewed. Oncology recs for transfusion as planned. Neupogen not recommended as neulasta was given on 4/19 - Pt no longer neutropenic and fevers ended. Discussed with Dr. Jana Hakim, who recommended completing course of Levaquin at d/c  DM-II: - Last A1c 7.8 on 02/11/17, notwell controled.  - Patient has been taking mixed insulin 70/30at home - insulin 70/30dose was decreased from 20 to 15 units twice a daywhile admitted  HTN: - Plan to continue amlodipine as tolerated  Anxiety: -Plan to continue home medications: - Will continue PRN Ativan  Breast cancer:  - Pt is s/p of L lumpectomy and currently  on Chemotherapy - Recommendations noted  by Dr. Jana Hakim  Antineoplastic chemotherapy induced pancytopenia [facility]): - Hgb 6 on 4/26 - 2 units prbcs were transfused - hgb thus far  Sore throat - New problem - no acute pathology within mouth on bedside exam - Given neutropenia, concerns for possible thrush/esophageal candidiasis - Had given trial of oral nystatin - Given viscous lidocaine PRN sore throat  Discharge Diagnoses:  Principal Problem:   Neutropenic fever ([place]) Active Problems:   Type 2 diabetes mellitus ([employer])   Essential hypertension   Malignant neoplasm of upper-inner quadrant of left breast in female, estrogen receptor positive ([place])   Antineoplastic chemotherapy induced pancytopenia ([place])   Sepsis ([place])   Neutropenia with fever ([place])    Discharge Instructions   Allergies as of 03/18/2017      Reactions   Pineapple Anaphylaxis, Hives      Medication List    TAKE these medications   amLODipine 5 MG tablet Commonly known as:  NORVASC Take 5 mg by mouth daily.   dexamethasone 4 MG tablet Commonly known as:  DECADRON Take 4 mg by mouth as directed. Take 2 tabs (8 mg) after chemo, Then Take 2 tabs (8 mg) daily for 2 days   glycopyrrolate 1 MG tablet Commonly known as:  ROBINUL Take 1 tablet (1 mg total) by mouth 3 (three) times daily as needed (excessive oral secretions).   insulin aspart protamine- aspart (70-30) 100 UNIT/ML injection Commonly known as:  NOVOLOG MIX 70/30 Inject 20 Units into the skin 2 (two) times daily with a meal.   levofloxacin 750 MG tablet Commonly known as:  LEVAQUIN Take 1 tablet (750 mg total)  by mouth daily.   lidocaine-prilocaine cream Commonly known as:  EMLA Apply to affected area once   LORazepam 0.5 MG tablet Commonly known as:  ATIVAN Take 1 tablet (0.5 mg total) by mouth every 6 (six) hours as needed (Nausea or vomiting).   nystatin 100000 UNIT/ML suspension Commonly known as:  MYCOSTATIN Use as directed 5  mLs (500,000 Units total) in the mouth or throat 4 (four) times daily.   prochlorperazine 10 MG tablet Commonly known as:  COMPAZINE Take 1 tablet (10 mg total) by mouth every 6 (six) hours as needed (Nausea or vomiting).   saccharomyces boulardii 250 MG capsule Commonly known as:  FLORASTOR Take 1 capsule (250 mg total) by mouth 2 (two) times daily.      Follow-up Information    [name], MD. Schedule an appointment as soon as possible for a visit in 2 week(s).   Specialty:  Internal Medicine Contact information: [place] [address] [phone]        Chauncey Cruel, MD Follow up.   Specialty:  Oncology Why:  follow up as scheduled Contact information: [place] [address] [phone]          Allergies  Allergen Reactions  . Pineapple Anaphylaxis and Hives    Consultations:  Oncology  Procedures/Studies: Dg Chest 2 View  Result Date: 03/13/2017 CLINICAL DATA:  Weakness.  History of breast cancer. EXAM: CHEST  2 VIEW COMPARISON:  02/09/2017 FINDINGS: The heart size and mediastinal contours are within normal limits. Port catheter tip is seen in the mid right atrium. Both lungs are clear. The visualized skeletal structures are unremarkable. The patient is status post ACDF of the lower cervical spine. IMPRESSION: No active cardiopulmonary disease. Port catheter tip in the mid right atrium on current exam, previously in the distal SVC -RA juncture. Electronically Signed   By: Ashley Royalty M.D.   On: 03/13/2017 20:39    Subjective: Very eager to go home  Discharge Exam: Vitals:   03/18/17 0454 03/18/17 1009  BP: (!) 143/70 (!) 151/72  Pulse: 77   Resp: (!) 22   Temp: 99.8 F (37.7 C)    Vitals:   03/17/17 1342 03/17/17 2034 03/18/17 0454 03/18/17 1009  BP: (!) 155/80 (!) 151/76 (!) 143/70 (!) 151/72  Pulse: 80 83 77   Resp: 20 20 (!) 22   Temp: 100 F (37.8 C) 99.9 F (37.7 C) 99.8 F (37.7  C)   TempSrc: Oral Oral Oral   SpO2: 99% 99% 100%   Weight:      Height:        General: Pt is alert, awake, not in acute distress Cardiovascular: RRR, S1/S2 +, no rubs, no gallops Respiratory: CTA bilaterally, no wheezing, no rhonchi Abdominal: Soft, NT, ND, bowel sounds + Extremities: no edema, no cyanosis   The results of significant diagnostics from this hospitalization (including imaging, microbiology, ancillary and laboratory) are listed below for reference.     Microbiology: Recent Results (from the past 240 hour(s))  Blood Culture (routine x 2)     Status: None   Collection Time: 03/13/17 10:21 PM  Result Value Ref Range Status   Specimen Description BLOOD PORT  Final   Special Requests   Final    BOTTLES DRAWN AEROBIC AND ANAEROBIC Blood Culture adequate volume   Culture   Final    NO GROWTH 5 DAYS Performed at [facility], [address] [place], [place] [address]  Report Status 03/18/2017 FINAL  Final  Blood Culture (routine x 2)     Status: None   Collection Time: 03/13/17 10:37 PM  Result Value Ref Range Status   Specimen Description BLOOD RIGHT ANTECUBITAL  Final   Special Requests   Final    BOTTLES DRAWN AEROBIC AND ANAEROBIC Blood Culture adequate volume   Culture   Final    NO GROWTH 5 DAYS Performed at [facility], [address], [place], [place] [address]    Report Status 03/18/2017 FINAL  Final  Urine culture     Status: Abnormal   Collection Time: 03/13/17 11:03 PM  Result Value Ref Range Status   Specimen Description URINE, RANDOM  Final   Special Requests NONE  Final   Culture MULTIPLE SPECIES PRESENT, SUGGEST RECOLLECTION (A)  Final   Report Status 03/15/2017 FINAL  Final     Labs: BNP (last 3 results)  Recent Labs  03/14/17 0102  BNP 94.7   Basic Metabolic Panel:  Recent Labs Lab 03/13/17 1957 03/14/17 0455 03/15/17 0500 03/16/17 0322 03/17/17 0408 03/18/17 0445  NA 138 137  --  138 139 141  K 4.1 3.9   --  3.2* 3.6 3.2*  CL 104 105  --  106 110 111  CO2 27 26  --  23 22 23   GLUCOSE 135* 175*  --  121* 135* 107*  BUN 19 14  --  9 10 10   CREATININE 1.03* 1.04* 1.07* 1.11* 1.05* 0.93  CALCIUM 7.7* 7.2*  --  6.9* 6.8* 7.0*   Liver Function Tests:  Recent Labs Lab 03/13/17 1957  AST 17  ALT 17  ALKPHOS 69  BILITOT 0.2*  PROT 6.0*  ALBUMIN 2.7*   No results for input(s): LIPASE, AMYLASE in the last 168 hours. No results for input(s): AMMONIA in the last 168 hours. CBC:  Recent Labs Lab 03/13/17 1957 03/14/17 0455 03/15/17 0500 03/16/17 0322 03/17/17 0408 03/18/17 0445  WBC 0.2* 0.1* 0.1* 0.1* 0.4* 1.3*  NEUTROABS 0.0*  --   --   --  0.3*  --   HGB 7.0* 6.0* 7.8* 7.8* 7.2* 7.1*  HCT 20.4* 17.5* 22.2* 23.3* 21.5* 20.7*  MCV 85.4 83.3 84.4 85.3 86.0 85.9  PLT 89* 60* 40* 22* 18* 27*   Cardiac Enzymes: No results for input(s): CKTOTAL, CKMB, CKMBINDEX, TROPONINI in the last 168 hours. BNP: Invalid input(s): POCBNP CBG:  Recent Labs Lab 03/17/17 1541 03/17/17 1656 03/17/17 2037 03/18/17 0753 03/18/17 1213  GLUCAP 106* 128* 135* 102* 94   D-Dimer No results for input(s): DDIMER in the last 72 hours. Hgb A1c No results for input(s): HGBA1C in the last 72 hours. Lipid Profile No results for input(s): CHOL, HDL, LDLCALC, TRIG, CHOLHDL, LDLDIRECT in the last 72 hours. Thyroid function studies No results for input(s): TSH, T4TOTAL, T3FREE, THYROIDAB in the last 72 hours.  Invalid input(s): FREET3 Anemia work up No results for input(s): VITAMINB12, FOLATE, FERRITIN, TIBC, IRON, RETICCTPCT in the last 72 hours. Urinalysis    Component Value Date/Time   COLORURINE STRAW (A) 03/13/2017 2011   APPEARANCEUR CLEAR 03/13/2017 2011   LABSPEC 1.011 03/13/2017 2011   LABSPEC 1.010 02/07/2017 1005   PHURINE 7.0 03/13/2017 2011   GLUCOSEU NEGATIVE 03/13/2017 2011   GLUCOSEU Negative 02/07/2017 1005   HGBUR NEGATIVE 03/13/2017 2011   BILIRUBINUR NEGATIVE 03/13/2017  2011   BILIRUBINUR Negative 02/07/2017 [place] 03/13/2017 2011   PROTEINUR 100 (A) 03/13/2017 2011  UROBILINOGEN 0.2 02/07/2017 1005   NITRITE NEGATIVE 03/13/2017 2011   LEUKOCYTESUR NEGATIVE 03/13/2017 2011   LEUKOCYTESUR Negative 02/07/2017 1005   Sepsis Labs Invalid input(s): PROCALCITONIN,  WBC,  LACTICIDVEN Microbiology Recent Results (from the past 240 hour(s))  Blood Culture (routine x 2)     Status: None   Collection Time: 03/13/17 10:21 PM  Result Value Ref Range Status   Specimen Description BLOOD PORT  Final   Special Requests   Final    BOTTLES DRAWN AEROBIC AND ANAEROBIC Blood Culture adequate volume   Culture   Final    NO GROWTH 5 DAYS Performed at [facility], [address] [place], [place] [address]    Report Status 03/18/2017 FINAL  Final  Blood Culture (routine x 2)     Status: None   Collection Time: 03/13/17 10:37 PM  Result Value Ref Range Status   Specimen Description BLOOD RIGHT ANTECUBITAL  Final   Special Requests   Final    BOTTLES DRAWN AEROBIC AND ANAEROBIC Blood Culture adequate volume   Culture   Final    NO GROWTH 5 DAYS Performed at [facility], [place] [address] [place],  [address]    Report Status 03/18/2017 FINAL  Final  Urine culture     Status: Abnormal   Collection Time: 03/13/17 11:03 PM  Result Value Ref Range Status   Specimen Description URINE, RANDOM  Final   Special Requests NONE  Final   Culture MULTIPLE SPECIES PRESENT, SUGGEST RECOLLECTION (A)  Final   Report Status 03/15/2017 FINAL  Final     SIGNED:   Donne Hazel, MD  [facility] 03/18/2017, 3:33 PM  If 7PM-7AM, please contact night-coverage [email or web address] Password [email or web address]

## 2017-03-18 NOTE — Plan of Care (Signed)
Problem: Skin Integrity: Goal: Risk for impaired skin integrity will decrease Outcome: Completed/Met Date Met: 03/18/17 WOC Consult ordered for blistering to right upper chest

## 2017-03-18 NOTE — Progress Notes (Signed)
Patient is A&Ox4, ambulatory without assist. No change from the am assessment. Discharge instructions reviewed. Questions, concerns were denied. Hard scripts provided.

## 2017-03-18 NOTE — Consult Note (Signed)
[facility] Nurse wound consult note Reason for Consult:two blisters at edge of thin film adhesive dressing-medical adhesive related skin injury (MARSI) Wound type:MARSI Pressure Injury POA: No Measurement:Two serum filled blisters of equal size located at the upper lateral corner of the thin film dressing for her port.  Measurement is 2cm x .08cm  Wound EUM:PNTIRW to visualize through epidermis and serum Drainage (amount, consistency, odor) none Periwound:intact Dressing procedure/placement/frequency: Port-a-Cath dressing change today prior to discharge. Blisters can be left open to air and should spontaneously reabsorb.  Should they rupture, moisturizing with OTC polysporin or hand cream from a tube and applied with clean hands will assist in resolution.  [facility] nursing team will not follow, but will remain available to this patient, the nursing and medical teams.  Please re-consult if needed. Thanks, Maudie Flakes, MSN, RN, [place], Ar[facility]  Pager# [phone]

## 2017-03-18 NOTE — Care Management Note (Signed)
Case Management Note  Patient Details  Name: Abigail Yoder MRN: [id number] Date of Birth: [birth year]  Subjective/Objective:                    Action/Plan:d/c home.   Expected Discharge Date:  03/18/17               Expected Discharge Plan:  Home/Self Care  In-House Referral:     Discharge planning Services  CM Consult  Post Acute Care Choice:    Choice offered to:     DME Arranged:    DME Agency:     HH Arranged:    HH Agency:     Status of Service:  Completed, signed off  If discussed at [employer] of Stay Meetings, dates discussed:    Additional Comments:  Dessa Phi, RN 03/18/2017, 11:03 AM

## 2017-03-22 ENCOUNTER — Encounter: Payer: Self-pay | Admitting: *Deleted

## 2017-03-22 ENCOUNTER — Encounter: Payer: Self-pay | Admitting: Oncology

## 2017-03-22 ENCOUNTER — Ambulatory Visit (HOSPITAL_BASED_OUTPATIENT_CLINIC_OR_DEPARTMENT_OTHER): Payer: BLUE CROSS/BLUE SHIELD | Admitting: Oncology

## 2017-03-22 ENCOUNTER — Other Ambulatory Visit (HOSPITAL_BASED_OUTPATIENT_CLINIC_OR_DEPARTMENT_OTHER): Payer: BLUE CROSS/BLUE SHIELD

## 2017-03-22 VITALS — BP 165/72 | HR 92 | Temp 98.3°F | Resp 18 | Ht 63.5 in | Wt 210.3 lb

## 2017-03-22 DIAGNOSIS — D61810 Antineoplastic chemotherapy induced pancytopenia: Secondary | ICD-10-CM

## 2017-03-22 DIAGNOSIS — D709 Neutropenia, unspecified: Secondary | ICD-10-CM

## 2017-03-22 DIAGNOSIS — T451X5A Adverse effect of antineoplastic and immunosuppressive drugs, initial encounter: Secondary | ICD-10-CM

## 2017-03-22 DIAGNOSIS — R5081 Fever presenting with conditions classified elsewhere: Secondary | ICD-10-CM

## 2017-03-22 DIAGNOSIS — C50212 Malignant neoplasm of upper-inner quadrant of left female breast: Secondary | ICD-10-CM | POA: Diagnosis not present

## 2017-03-22 DIAGNOSIS — E119 Type 2 diabetes mellitus without complications: Secondary | ICD-10-CM

## 2017-03-22 DIAGNOSIS — I10 Essential (primary) hypertension: Secondary | ICD-10-CM

## 2017-03-22 DIAGNOSIS — Z170 Estrogen receptor positive status [ER+]: Principal | ICD-10-CM

## 2017-03-22 LAB — CBC WITH DIFFERENTIAL/PLATELET
BASO%: 0.6 % (ref 0.0–2.0)
Basophils Absolute: 0.1 10*3/uL (ref 0.0–0.1)
EOS%: 0 % (ref 0.0–7.0)
Eosinophils Absolute: 0 10*3/uL (ref 0.0–0.5)
HCT: 26.5 % — ABNORMAL LOW (ref 34.8–46.6)
HEMOGLOBIN: 8.6 g/dL — AB (ref 11.6–15.9)
LYMPH%: 3.6 % — ABNORMAL LOW (ref 14.0–49.7)
MCH: 28.7 pg (ref 25.1–34.0)
MCHC: 32.6 g/dL (ref 31.5–36.0)
MCV: 87.9 fL (ref 79.5–101.0)
MONO#: 1.4 10*3/uL — ABNORMAL HIGH (ref 0.1–0.9)
MONO%: 12.3 % (ref 0.0–14.0)
NEUT%: 83.5 % — ABNORMAL HIGH (ref 38.4–76.8)
NEUTROS ABS: 9.3 10*3/uL — AB (ref 1.5–6.5)
Platelets: 133 10*3/uL — ABNORMAL LOW (ref 145–400)
RBC: 3.01 10*6/uL — AB (ref 3.70–5.45)
RDW: 14.4 % (ref 11.2–14.5)
WBC: 11.1 10*3/uL — ABNORMAL HIGH (ref 3.9–10.3)
lymph#: 0.4 10*3/uL — ABNORMAL LOW (ref 0.9–3.3)

## 2017-03-22 LAB — COMPREHENSIVE METABOLIC PANEL
ALBUMIN: 2.6 g/dL — AB (ref 3.5–5.0)
ALT: 16 U/L (ref 0–55)
AST: 17 U/L (ref 5–34)
Alkaline Phosphatase: 93 U/L (ref 40–150)
Anion Gap: 13 mEq/L — ABNORMAL HIGH (ref 3–11)
BILIRUBIN TOTAL: 0.25 mg/dL (ref 0.20–1.20)
BUN: 5.7 mg/dL — AB (ref 7.0–26.0)
CO2: 29 meq/L (ref 22–29)
Calcium: 8.6 mg/dL (ref 8.4–10.4)
Chloride: 105 mEq/L (ref 98–109)
Creatinine: 1 mg/dL (ref 0.6–1.1)
EGFR: 75 mL/min/{1.73_m2} — ABNORMAL LOW (ref >90)
Glucose: 201 mg/dl — ABNORMAL HIGH (ref 70–140)
Potassium: 3.2 mEq/L — ABNORMAL LOW (ref 3.5–5.1)
SODIUM: 147 meq/L — AB (ref 136–145)
TOTAL PROTEIN: 6.4 g/dL (ref 6.4–8.3)

## 2017-03-22 LAB — TECHNOLOGIST REVIEW: Technologist Review: 2

## 2017-03-22 NOTE — Progress Notes (Signed)
Met with patient whom brought proof of income for [employer]. Patient approved for one-time $1000 grant. Patient has a copy of the approval as well as the expense sheet and our Outpatient pharmacy information. Explained to patient how grant works. Asked patient about OOP. Patient states she thinks she has satisfied her deductible/OOP. Advised patient if she receives a bill for co-pay, we may apply to foundations such as [employer] to help cover her portion of what she has to pay for treatment. Patient has my card for any additional financial questions or concerns.

## 2017-03-22 NOTE — Progress Notes (Signed)
[place]  [phone] [phone]     ID: Abigail Yoder DOB: 05-19-1966  MR#: [id number]  VXY#:[id number]  Patient Care Team: Abigail Gravel, Yoder as PCP - General (Internal Medicine) Abigail Overall, Yoder as Consulting Physician (General Surgery) Abigail Yoder as Consulting Physician (Oncology) Abigail Gibson, Yoder as Attending Physician (Radiation Oncology) Abigail Yoder, Yoder as Consulting Physician (Endocrinology) Abigail Hughs, Yoder as Attending Physician (Urology) Abigail Cruel, Yoder OTHER Yoder:  CHIEF COMPLAINT: Estrogen receptor positive breast cancer  CURRENT TREATMENT: adjuvant chemotherapy   BREAST CANCER HISTORY: From the original intake note:  Abigail Yoder had bilateral routine screening mammography with tomography at [facility] 11/14/2016. The breast density was category B. In the left breast upper inner quadrant there was a 1.5 cm high density mass which was further evaluated with ultrasonography 11/16/2016. This confirmed a 1.2 cm lobulated mass in the left breast upper inner quadrant. The left axilla was benign.  Biopsy of the left breast mass in question 11/21/2016 showed (SAA [id number]) invasive ductal carcinoma, grade 3, estrogen and progesterone receptor positive, HER-2 nonamplified, with a signals ratio of 1.05 and the number per cell 2.15, with an MIB-1 of 90%.  Her subsequent history is as detailed below  INTERVAL HISTORY: Abigail Yoder returns today for follow-up of her estrogen receptor positive breast cancer.  She received her fourth cycle of 4 planned cycles of doxorubicin and cyclophosphamide on 03/07/2017. Like cycle 2, this landed her in the emergency room with febrile neutropenia and she was only discharged 03/18/2017. She is now ready to start her weekly treatments with taxanes.  REVIEW OF SYSTEMS: The blisters in her mouth are gone, although she has a little bit of thrush leftover she says. She is using nystatin swishes for that. Her  ankles are swollen and sore. Her appetite is a little better but her sense of taste is still terrible. She enjoys vegetables and some fruit. She tells me even water doesn't taste right. Her energy is better however. Of course to get to her apartment she has to climb 3 flights and she is doing that several times a day going in and out as needed. She has had no fevers, rash, or bleeding. A detailed review of systems today was otherwise stable  PAST MEDICAL HISTORY: Past Medical History:  Diagnosis Date  . Anemia   . Diabetes mellitus   . Family history of breast cancer   . Hypertension   . Obesity     PAST SURGICAL HISTORY: Past Surgical History:  Procedure Laterality Date  . APPENDECTOMY    . BREAST LUMPECTOMY WITH RADIOACTIVE SEED AND SENTINEL LYMPH NODE BIOPSY Left 12/31/2016   Procedure: BREAST LUMPECTOMY WITH RADIOACTIVE SEED AND SENTINEL LYMPH NODE BIOPSY;  Surgeon: Abigail Overall, Yoder;  Location: [facility];  Service: General;  Laterality: Left;  . CERVICAL SPINE SURGERY    . CESAREAN SECTION    . KNEE SURGERY Left   . PORTACATH PLACEMENT N/A 12/31/2016   Procedure: INSERTION PORT-A-CATH WITH [place];  Surgeon: Abigail Overall, Yoder;  Location: [facility];  Service: General;  Laterality: N/A;  . TUBAL LIGATION      FAMILY HISTORY Family History  Problem Relation Age of Onset  . Diabetes Mother   . Arthritis Mother   . Hypertension Father   . Heart disease Father   . Hyperlipidemia Father   . Breast cancer Sister 23    came back 17 years later  . Stroke Maternal Uncle   .  Stroke Maternal Grandmother   The patient's father died at the age of 29 from a brain aneurysm. The patient's mother died at age 33 following total hip replacement. The patient had 4 brothers, 5 sisters. One sister was diagnosed with breast cancer at age 74. There is no history of ovarian cancer in the family.  GYNECOLOGIC HISTORY:  No LMP recorded. Patient is not currently having periods  (Reason: Perimenopausal). Menarche age 8, first live birth age 63, the patient is [place] P2. She is perimenopausal and her most recent period was October 2017. She never used oral contraceptives.  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 Her husband Abigail Yoder works in a Proofreader. Daughter Abigail Yoder works for the [employer] in [place]. Son show more right works for the Glass blower/designer at [employer] in [place]. The patient has 2 grandchildren. She attends a local [employer]: Not in place   HEALTH MAINTENANCE: Social History  Substance Use Topics  . Smoking status: Former Smoker    Quit date: 11/19/1994  . Smokeless tobacco: Never Used  . Alcohol use No     Colonoscopy:Never  PAP:2015  Bone density:Never   Allergies  Allergen Reactions  . Pineapple Anaphylaxis and Hives    Current Outpatient Prescriptions  Medication Sig Dispense Refill  . amLODipine (NORVASC) 5 MG tablet Take 5 mg by mouth daily.    Marland Kitchen dexamethasone (DECADRON) 4 MG tablet Take 4 mg by mouth as directed. Take 2 tabs (8 mg) after chemo, Then Take 2 tabs (8 mg) daily for 2 days  0  . glycopyrrolate (ROBINUL) 1 MG tablet Take 1 tablet (1 mg total) by mouth 3 (three) times daily as needed (excessive oral secretions). 30 tablet 0  . insulin aspart protamine- aspart (NOVOLOG MIX 70/30) (70-30) 100 UNIT/ML injection Inject 20 Units into the skin 2 (two) times daily with a meal.    . levofloxacin (LEVAQUIN) 750 MG tablet Take 1 tablet (750 mg total) by mouth daily. 4 tablet 0  . nystatin (MYCOSTATIN) 100000 UNIT/ML suspension Use as directed 5 mLs (500,000 Units total) in the mouth or throat 4 (four) times daily. 60 mL 0  . saccharomyces boulardii (FLORASTOR) 250 MG capsule Take 1 capsule (250 mg total) by mouth 2 (two) times daily. 60 capsule 0   No current facility-administered  medications for this visit.     OBJECTIVE: Middle-aged African-American woman in no acute distress  Vitals:   03/22/17 1036  BP: (!) 165/72  Pulse: 92  Resp: 18  Temp: 98.3 F (36.8 C)     Body mass index is 36.67 kg/m.    ECOG FS:1 - Symptomatic but completely ambulatory  Sclerae unicteric, pupils round and equal Oropharynx clear and moist No cervical or supraclavicular adenopathy Lungs no rales or rhonchi Heart regular rate and rhythm Abd soft, nontender, positive bowel sounds MSK no focal spinal tenderness, no upper extremity lymphedema Neuro: nonfocal, well oriented, appropriate affect Breasts: Deferred     LAB RESULTS:  CMP     Component Value Date/Time   NA 147 (H) 03/22/2017 1007   K 3.2 (L) 03/22/2017 1007   CL 111 03/18/2017 0445   CO2 29 03/22/2017 1007   GLUCOSE 201 (H) 03/22/2017 1007   GLUCOSE 333 (H) 09/05/2006 1306   BUN 5.7 (L) 03/22/2017 1007   CREATININE 1.0 03/22/2017 1007   CALCIUM 8.6 03/22/2017 1007  PROT 6.4 03/22/2017 1007   ALBUMIN 2.6 (L) 03/22/2017 1007   AST 17 03/22/2017 1007   ALT 16 03/22/2017 1007   ALKPHOS 93 03/22/2017 1007   BILITOT 0.25 03/22/2017 1007   GFRNONAA >60 03/18/2017 0445   GFRAA >60 03/18/2017 0445    INo results found for: SPEP, UPEP  Lab Results  Component Value Date   WBC 11.1 (H) 03/22/2017   NEUTROABS 9.3 (H) 03/22/2017   HGB 8.6 (L) 03/22/2017   HCT 26.5 (L) 03/22/2017   MCV 87.9 03/22/2017   PLT 133 (L) 03/22/2017      Chemistry      Component Value Date/Time   NA 147 (H) 03/22/2017 1007   K 3.2 (L) 03/22/2017 1007   CL 111 03/18/2017 0445   CO2 29 03/22/2017 1007   BUN 5.7 (L) 03/22/2017 1007   CREATININE 1.0 03/22/2017 1007      Component Value Date/Time   CALCIUM 8.6 03/22/2017 1007   ALKPHOS 93 03/22/2017 1007   AST 17 03/22/2017 1007   ALT 16 03/22/2017 1007   BILITOT 0.25 03/22/2017 1007       No results found for: LABCA2  No components found for: [id number]  No results  for input(s): INR in the last 168 hours.  Urinalysis    Component Value Date/Time   COLORURINE STRAW (A) 03/13/2017 2011   APPEARANCEUR CLEAR 03/13/2017 2011   LABSPEC 1.011 03/13/2017 2011   LABSPEC 1.010 02/07/2017 1005   PHURINE 7.0 03/13/2017 2011   GLUCOSEU NEGATIVE 03/13/2017 2011   GLUCOSEU Negative 02/07/2017 1005   HGBUR NEGATIVE 03/13/2017 2011   BILIRUBINUR NEGATIVE 03/13/2017 2011   BILIRUBINUR Negative 02/07/2017 [place] 03/13/2017 2011   PROTEINUR 100 (A) 03/13/2017 2011   UROBILINOGEN 0.2 02/07/2017 1005   NITRITE NEGATIVE 03/13/2017 2011   LEUKOCYTESUR NEGATIVE 03/13/2017 2011   LEUKOCYTESUR Negative 02/07/2017 1005     STUDIES: Dg Chest 2 View  Result Date: 03/13/2017 CLINICAL DATA:  Weakness.  History of breast cancer. EXAM: CHEST  2 VIEW COMPARISON:  02/09/2017 FINDINGS: The heart size and mediastinal contours are within normal limits. Port catheter tip is seen in the mid right atrium. Both lungs are clear. The visualized skeletal structures are unremarkable. The patient is status post ACDF of the lower cervical spine. IMPRESSION: No active cardiopulmonary disease. Port catheter tip in the mid right atrium on current exam, previously in the distal SVC -RA juncture. Electronically Signed   By: Ashley Royalty M.D.   On: 03/13/2017 20:39    ELIGIBLE FOR AVAILABLE RESEARCH PROTOCOL: No   ASSESSMENT: 51 y.o. [place] woman status post left breast upper inner quadrant biopsy 11/21/2016 for a clinical T1 cN0, stage IA invasive ductal carcinoma, grade 3, estrogen and progesterone receptor positive, HER-2 not amplified, with an MIB-1 of 90%  (1) Oncotype DX Score of 60 predicts a risk of recurrence outside the breast of 34% if the patient's only systemic therapy is tamoxifen for 5 years. On a similar database this risk drops to about 12% if chemotherapy is also given  (2) genetics testing sent 11/28/2016; The Breast/GYN gene panel offered by [facility]  includes sequencing and rearrangement analysis for the following 23 genes:  ATM, BARD1, BRCA1, BRCA2, BRIP1, CDH1, CHEK2, EPCAM, FANCC, MLH1, MSH2, MSH6, MUTYH, NBN, NF1, PALB2, PMS2, POLD1, PTEN, RAD51C, RAD51D, RECQL, and TP53.   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    (  3) status post left breast lumpectomy on 12/31/2016: invasive ductal carcinoma, 1.8cm, grade 3, margins negative, 1 SLN negative, ER+(80%), PR-(2%), Ki-67 90%, HER-2 negative (ratio 1.05).  T1cN0  (4) chemotherapy consisting of cyclophosphamide and doxorubicin in dose dense fashion 4 started 01/17/17), completed 03/07/2017, to be followed by Abraxane weekly 12.  (a)  cycle 3 delayed because of febrile neutropenia  (b) febrile neutropenia also occurred after cycle 4  (5) adjuvant radiation to follow as appropriate.  PLAN: Abigail Yoder has completed the "difficult" part of her chemotherapy and has recovered from her second episode of febrile neutropenia. She is going to be ready to start her weekly taxanes treatments after she has a week "off" to more fully recover.  She understands we generally use Taxol at this time, but in diabetic patients I try to avoid using steroids as much as possible so we will use Abraxane, which requires no steroid premedication.  Abraxane however can have multiple side effects and we discussed those today. The most important one is the possibility of peripheral neuropathy. I alerted her to tell us if she develops any numbness or tingling in her finger pads or toe Pats. She understands that if this develops it may be permanent.  We have a presentation of neuropathy 04/11/2017 and urged her to come that evening to get yet more information  Otherwise she will see me again in May 22, with her second dose of Abraxane. She will let me know she has any other issues before that visit.  Abigail Cruel, Yoder    03/24/2017 12:09 PM Medical Oncology and Hematology [facility] [address] [place], [place] [address] Tel. [phone]    Fax. [phone]

## 2017-03-22 NOTE — Progress Notes (Signed)
Please disregard previous note concerning [facility]LS. Documented in error.

## 2017-03-22 NOTE — Progress Notes (Signed)
Received signed physician form from [facility] at my desk. Faxed to [facility]. Fax received ok per confirmation sheet.

## 2017-04-02 ENCOUNTER — Other Ambulatory Visit (HOSPITAL_BASED_OUTPATIENT_CLINIC_OR_DEPARTMENT_OTHER): Payer: BLUE CROSS/BLUE SHIELD

## 2017-04-02 ENCOUNTER — Ambulatory Visit (HOSPITAL_BASED_OUTPATIENT_CLINIC_OR_DEPARTMENT_OTHER): Payer: BLUE CROSS/BLUE SHIELD

## 2017-04-02 VITALS — HR 90 | Temp 98.7°F | Resp 20

## 2017-04-02 DIAGNOSIS — C50212 Malignant neoplasm of upper-inner quadrant of left female breast: Secondary | ICD-10-CM

## 2017-04-02 DIAGNOSIS — Z5111 Encounter for antineoplastic chemotherapy: Secondary | ICD-10-CM | POA: Diagnosis not present

## 2017-04-02 DIAGNOSIS — Z170 Estrogen receptor positive status [ER+]: Principal | ICD-10-CM

## 2017-04-02 LAB — COMPREHENSIVE METABOLIC PANEL
ALT: 18 U/L (ref 0–55)
ANION GAP: 11 meq/L (ref 3–11)
AST: 22 U/L (ref 5–34)
Albumin: 2.8 g/dL — ABNORMAL LOW (ref 3.5–5.0)
Alkaline Phosphatase: 84 U/L (ref 40–150)
BILIRUBIN TOTAL: 0.3 mg/dL (ref 0.20–1.20)
BUN: 7.5 mg/dL (ref 7.0–26.0)
CHLORIDE: 104 meq/L (ref 98–109)
CO2: 30 meq/L — AB (ref 22–29)
Calcium: 8.4 mg/dL (ref 8.4–10.4)
Creatinine: 0.8 mg/dL (ref 0.6–1.1)
Glucose: 178 mg/dl — ABNORMAL HIGH (ref 70–140)
Potassium: 3.3 mEq/L — ABNORMAL LOW (ref 3.5–5.1)
SODIUM: 145 meq/L (ref 136–145)
TOTAL PROTEIN: 6.6 g/dL (ref 6.4–8.3)

## 2017-04-02 LAB — CBC WITH DIFFERENTIAL/PLATELET
BASO%: 0.4 % (ref 0.0–2.0)
BASOS ABS: 0 10*3/uL (ref 0.0–0.1)
EOS%: 0.8 % (ref 0.0–7.0)
Eosinophils Absolute: 0.1 10*3/uL (ref 0.0–0.5)
HCT: 26.2 % — ABNORMAL LOW (ref 34.8–46.6)
HEMOGLOBIN: 8.7 g/dL — AB (ref 11.6–15.9)
LYMPH%: 4.9 % — ABNORMAL LOW (ref 14.0–49.7)
MCH: 29.7 pg (ref 25.1–34.0)
MCHC: 33.1 g/dL (ref 31.5–36.0)
MCV: 89.7 fL (ref 79.5–101.0)
MONO#: 1.4 10*3/uL — ABNORMAL HIGH (ref 0.1–0.9)
MONO%: 16 % — AB (ref 0.0–14.0)
NEUT#: 6.9 10*3/uL — ABNORMAL HIGH (ref 1.5–6.5)
NEUT%: 77.9 % — ABNORMAL HIGH (ref 38.4–76.8)
Platelets: 220 10*3/uL (ref 145–400)
RBC: 2.92 10*6/uL — AB (ref 3.70–5.45)
RDW: 17.8 % — AB (ref 11.2–14.5)
WBC: 8.9 10*3/uL (ref 3.9–10.3)
lymph#: 0.4 10*3/uL — ABNORMAL LOW (ref 0.9–3.3)

## 2017-04-02 MED ORDER — HEPARIN SOD (PORK) LOCK FLUSH 100 UNIT/ML IV SOLN
500.0000 [IU] | Freq: Once | INTRAVENOUS | Status: AC | PRN
  Administered 2017-04-02: 500 [IU]
  Filled 2017-04-02: qty 5

## 2017-04-02 MED ORDER — PACLITAXEL PROTEIN-BOUND CHEMO INJECTION 100 MG
100.0000 mg/m2 | Freq: Once | INTRAVENOUS | Status: AC
  Administered 2017-04-02: 200 mg via INTRAVENOUS
  Filled 2017-04-02: qty 40

## 2017-04-02 MED ORDER — PROCHLORPERAZINE MALEATE 10 MG PO TABS
ORAL_TABLET | ORAL | Status: AC
  Filled 2017-04-02: qty 1

## 2017-04-02 MED ORDER — PROCHLORPERAZINE MALEATE 10 MG PO TABS
10.0000 mg | ORAL_TABLET | Freq: Once | ORAL | Status: AC
  Administered 2017-04-02: 10 mg via ORAL

## 2017-04-02 MED ORDER — SODIUM CHLORIDE 0.9 % IV SOLN
Freq: Once | INTRAVENOUS | Status: AC
  Administered 2017-04-02: 15:00:00 via INTRAVENOUS

## 2017-04-02 MED ORDER — SODIUM CHLORIDE 0.9% FLUSH
10.0000 mL | INTRAVENOUS | Status: DC | PRN
  Administered 2017-04-02: 10 mL
  Filled 2017-04-02: qty 10

## 2017-04-02 NOTE — Patient Instructions (Addendum)
[name] Discharge Instructions for Patients Receiving Chemotherapy  Today you received the following chemotherapy agents Abraxane  To help prevent nausea and vomiting after your treatment, we encourage you to take your nausea medication    If you develop nausea and vomiting that is not controlled by your nausea medication, call the clinic.   BELOW ARE SYMPTOMS THAT SHOULD BE REPORTED IMMEDIATELY:  *FEVER GREATER THAN 100.5 F  *CHILLS WITH OR WITHOUT FEVER  NAUSEA AND VOMITING THAT IS NOT CONTROLLED WITH YOUR NAUSEA MEDICATION  *UNUSUAL SHORTNESS OF BREATH  *UNUSUAL BRUISING OR BLEEDING  TENDERNESS IN MOUTH AND THROAT WITH OR WITHOUT PRESENCE OF ULCERS  *URINARY PROBLEMS  *BOWEL PROBLEMS  UNUSUAL RASH Items with * indicate a potential emergency and should be followed up as soon as possible.  Feel free to call the clinic you have any questions or concerns. The clinic phone number is [phone].  Please show the [facility] at check-in to the Emergency Department and triage nurse.   Nanoparticle Albumin-Bound Paclitaxel injection What is this medicine? NANOPARTICLE ALBUMIN-BOUND PACLITAXEL (Na no PAHR ti kuhl al BYOO muhn-bound PAK li TAX el) is a chemotherapy drug. It targets fast dividing cells, like cancer cells, and causes these cells to die. This medicine is used to treat advanced breast cancer and advanced lung cancer. This medicine may be used for other purposes; ask your health care provider or pharmacist if you have questions. COMMON BRAND NAME(S): Abraxane What should I tell my health care provider before I take this medicine? They need to know if you have any of these conditions: -kidney disease -liver disease -low blood counts, like low platelets, red blood cells, or white blood cells -recent or ongoing radiation therapy -an unusual or allergic reaction to paclitaxel, albumin, other chemotherapy, other medicines, foods, dyes, or  preservatives -pregnant or trying to get pregnant -breast-feeding How should I use this medicine? This drug is given as an infusion into a vein. It is administered in a hospital or clinic by a specially trained health care professional. Talk to your pediatrician regarding the use of this medicine in children. Special care may be needed. Overdosage: If you think you have taken too much of this medicine contact a poison control center or emergency room at once. NOTE: This medicine is only for you. Do not share this medicine with others. What if I miss a dose? It is important not to miss your dose. Call your doctor or health care professional if you are unable to keep an appointment. What may interact with this medicine? -cyclosporine -diazepam -ketoconazole -medicines to increase blood counts like filgrastim, pegfilgrastim, sargramostim -other chemotherapy drugs like cisplatin, doxorubicin, epirubicin, etoposide, teniposide, vincristine -quinidine -testosterone -vaccines -verapamil Talk to your doctor or health care professional before taking any of these medicines: -acetaminophen -aspirin -ibuprofen -ketoprofen -naproxen This list may not describe all possible interactions. Give your health care provider a list of all the medicines, herbs, non-prescription drugs, or dietary supplements you use. Also tell them if you smoke, drink alcohol, or use illegal drugs. Some items may interact with your medicine. What should I watch for while using this medicine? Your condition will be monitored carefully while you are receiving this medicine. You will need important blood work done while you are taking this medicine. This medicine can cause serious allergic reactions. If you experience allergic reactions like skin rash, itching or hives, swelling of the face, lips, or tongue, tell your doctor or health care professional right  away. In some cases, you may be given additional medicines to help with  side effects. Follow all directions for their use. This drug may make you feel generally unwell. This is not uncommon, as chemotherapy can affect healthy cells as well as cancer cells. Report any side effects. Continue your course of treatment even though you feel ill unless your doctor tells you to stop. Call your doctor or health care professional for advice if you get a fever, chills or sore throat, or other symptoms of a cold or flu. Do not treat yourself. This drug decreases your body's ability to fight infections. Try to avoid being around people who are sick. This medicine may increase your risk to bruise or bleed. Call your doctor or health care professional if you notice any unusual bleeding. Be careful brushing and flossing your teeth or using a toothpick because you may get an infection or bleed more easily. If you have any dental work done, tell your dentist you are receiving this medicine. Avoid taking products that contain aspirin, acetaminophen, ibuprofen, naproxen, or ketoprofen unless instructed by your doctor. These medicines may hide a fever. Do not become pregnant while taking this medicine. Women should inform their doctor if they wish to become pregnant or think they might be pregnant. There is a potential for serious side effects to an unborn child. Talk to your health care professional or pharmacist for more information. Do not breast-feed an infant while taking this medicine. Men are advised not to father a child while receiving this medicine. What side effects may I notice from receiving this medicine? Side effects that you should report to your doctor or health care professional as soon as possible: -allergic reactions like skin rash, itching or hives, swelling of the face, lips, or tongue -low blood counts - This drug may decrease the number of white blood cells, red blood cells and platelets. You may be at increased risk for infections and bleeding. -signs of infection -  fever or chills, cough, sore throat, pain or difficulty passing urine -signs of decreased platelets or bleeding - bruising, pinpoint red spots on the skin, black, tarry stools, nosebleeds -signs of decreased red blood cells - unusually weak or tired, fainting spells, lightheadedness -breathing problems -changes in vision -chest pain -high or low blood pressure -mouth sores -nausea and vomiting -pain, swelling, redness or irritation at the injection site -pain, tingling, numbness in the hands or feet -slow or irregular heartbeat -swelling of the ankle, feet, hands Side effects that usually do not require medical attention (report to your doctor or health care professional if they continue or are bothersome): -aches, pains -changes in the color of fingernails -diarrhea -hair loss -loss of appetite This list may not describe all possible side effects. Call your doctor for medical advice about side effects. You may report side effects to FDA at 1-800-FDA-1088. Where should I keep my medicine? This drug is given in a hospital or clinic and will not be stored at home. NOTE: This sheet is a summary. It may not cover all possible information. If you have questions about this medicine, talk to your doctor, pharmacist, or health care provider.  2018 Elsevier/Gold Standard (2015-09-07 10:05:20)

## 2017-04-03 ENCOUNTER — Telehealth: Payer: Self-pay | Admitting: *Deleted

## 2017-04-03 NOTE — Telephone Encounter (Addendum)
-----   Message from Azzie Glatter, RN sent at 04/02/2017  4:25 PM EDT ----- Regarding: "1st time chemotherapy----per Dr. Jana Hakim" Patient received Abraxane for the 1st time, per Dr. Jana Hakim.  Tolerated treatment well with no complaints.  Per chemo follow up call on 04/03/2017 pt states no problems, feels well. Able to eat and drink well.  Per phone discussion pt did request to change further chemo treatment to Thursday per her work schedule.  LOS sent per above- note pt will keep lab and MD on 5/22 - but requested chemo to be changed to 5/24 and then following treatments to be rescheduled to Thursdays.

## 2017-04-09 ENCOUNTER — Other Ambulatory Visit (HOSPITAL_BASED_OUTPATIENT_CLINIC_OR_DEPARTMENT_OTHER): Payer: BLUE CROSS/BLUE SHIELD

## 2017-04-09 ENCOUNTER — Ambulatory Visit (HOSPITAL_BASED_OUTPATIENT_CLINIC_OR_DEPARTMENT_OTHER): Payer: BLUE CROSS/BLUE SHIELD | Admitting: Oncology

## 2017-04-09 ENCOUNTER — Telehealth: Payer: Self-pay | Admitting: Oncology

## 2017-04-09 ENCOUNTER — Ambulatory Visit: Payer: BLUE CROSS/BLUE SHIELD

## 2017-04-09 VITALS — BP 137/72 | HR 95 | Temp 98.2°F | Resp 20 | Ht 63.5 in | Wt 202.7 lb

## 2017-04-09 DIAGNOSIS — Z170 Estrogen receptor positive status [ER+]: Secondary | ICD-10-CM | POA: Diagnosis not present

## 2017-04-09 DIAGNOSIS — C50212 Malignant neoplasm of upper-inner quadrant of left female breast: Secondary | ICD-10-CM | POA: Diagnosis not present

## 2017-04-09 DIAGNOSIS — I10 Essential (primary) hypertension: Secondary | ICD-10-CM

## 2017-04-09 DIAGNOSIS — E119 Type 2 diabetes mellitus without complications: Secondary | ICD-10-CM | POA: Diagnosis not present

## 2017-04-09 LAB — CBC WITH DIFFERENTIAL/PLATELET
BASO%: 0.4 % (ref 0.0–2.0)
BASOS ABS: 0 10*3/uL (ref 0.0–0.1)
EOS%: 2.4 % (ref 0.0–7.0)
Eosinophils Absolute: 0.1 10*3/uL (ref 0.0–0.5)
HCT: 25.5 % — ABNORMAL LOW (ref 34.8–46.6)
HEMOGLOBIN: 8.6 g/dL — AB (ref 11.6–15.9)
LYMPH%: 6.5 % — ABNORMAL LOW (ref 14.0–49.7)
MCH: 30.5 pg (ref 25.1–34.0)
MCHC: 33.6 g/dL (ref 31.5–36.0)
MCV: 91 fL (ref 79.5–101.0)
MONO#: 0.3 10*3/uL (ref 0.1–0.9)
MONO%: 5 % (ref 0.0–14.0)
NEUT%: 85.7 % — ABNORMAL HIGH (ref 38.4–76.8)
NEUTROS ABS: 5 10*3/uL (ref 1.5–6.5)
Platelets: 156 10*3/uL (ref 145–400)
RBC: 2.8 10*6/uL — ABNORMAL LOW (ref 3.70–5.45)
RDW: 18.2 % — ABNORMAL HIGH (ref 11.2–14.5)
WBC: 5.8 10*3/uL (ref 3.9–10.3)
lymph#: 0.4 10*3/uL — ABNORMAL LOW (ref 0.9–3.3)

## 2017-04-09 LAB — COMPREHENSIVE METABOLIC PANEL
ALBUMIN: 2.8 g/dL — AB (ref 3.5–5.0)
ALK PHOS: 72 U/L (ref 40–150)
ALT: 18 U/L (ref 0–55)
AST: 25 U/L (ref 5–34)
Anion Gap: 11 mEq/L (ref 3–11)
BUN: 12.6 mg/dL (ref 7.0–26.0)
CO2: 30 mEq/L — ABNORMAL HIGH (ref 22–29)
Calcium: 8.4 mg/dL (ref 8.4–10.4)
Chloride: 99 mEq/L (ref 98–109)
Creatinine: 1.3 mg/dL — ABNORMAL HIGH (ref 0.6–1.1)
EGFR: 57 mL/min/{1.73_m2} — ABNORMAL LOW (ref >90)
GLUCOSE: 341 mg/dL — AB (ref 70–140)
Potassium: 3.3 mEq/L — ABNORMAL LOW (ref 3.5–5.1)
SODIUM: 140 meq/L (ref 136–145)
Total Bilirubin: 0.35 mg/dL (ref 0.20–1.20)
Total Protein: 7 g/dL (ref 6.4–8.3)

## 2017-04-09 NOTE — Telephone Encounter (Signed)
Faxed records to [facility] [phone]

## 2017-04-09 NOTE — Progress Notes (Signed)
[place]  [phone]) [phone] [phone]) [phone]     ID: Abigail Yoder DOB: 1966-10-17  MR#: [id number]  EQA#:[id number]  Patient Care Team: [name], MD as PCP - General (Internal Medicine) Alphonsa Overall, MD as Consulting Physician (General Surgery) Anaston Koehn, Virgie Dad, MD as Consulting Physician (Oncology) Eppie Gibson, MD as Attending Physician (Radiation Oncology) Jacelyn Pi, MD as Consulting Physician (Endocrinology) Ardis Hughs, MD as Attending Physician (Urology) Chauncey Cruel, MD OTHER MD:  CHIEF COMPLAINT: Estrogen receptor positive breast cancer  CURRENT TREATMENT: adjuvant chemotherapy   BREAST CANCER HISTORY: From the original intake note:  Abigail Yoder had bilateral routine screening mammography with tomography at [facility] 11/14/2016. The breast density was category B. In the left breast upper inner quadrant there was a 1.5 cm high density mass which was further evaluated with ultrasonography 11/16/2016. This confirmed a 1.2 cm lobulated mass in the left breast upper inner quadrant. The left axilla was benign.  Biopsy of the left breast mass in question 11/21/2016 showed (SAA [id number]) invasive ductal carcinoma, grade 3, estrogen and progesterone receptor positive, HER-2 nonamplified, with a signals ratio of 1.05 and the number per cell 2.15, with an MIB-1 of 90%.  Her subsequent history is as detailed below  INTERVAL HISTORY: Abigail Yoder returns today for follow-up of her estrogen receptor positive breast cancer. Today is the one cycle 2 of 12 planned cycles of weekly paclitaxel.  She did not do well with the first cycle. She was in bed all day. She did not have fevers, rash, or bleeding problems. She had achy bones. She had a little bit of a cough and runny nose. Although symptoms have essentially resolved.  REVIEW OF SYSTEMS: A detailed review of systems today was otherwise noncontributory  PAST MEDICAL HISTORY: Past Medical History:    Diagnosis Date  . Anemia   . Diabetes mellitus   . Family history of breast cancer   . Hypertension   . Obesity     PAST SURGICAL HISTORY: Past Surgical History:  Procedure Laterality Date  . APPENDECTOMY    . BREAST LUMPECTOMY WITH RADIOACTIVE SEED AND SENTINEL LYMPH NODE BIOPSY Left 12/31/2016   Procedure: BREAST LUMPECTOMY WITH RADIOACTIVE SEED AND SENTINEL LYMPH NODE BIOPSY;  Surgeon: Alphonsa Overall, MD;  Location: [place];  Service: General;  Laterality: Left;  . CERVICAL SPINE SURGERY    . CESAREAN SECTION    . KNEE SURGERY Left   . PORTACATH PLACEMENT N/A 12/31/2016   Procedure: INSERTION PORT-A-CATH WITH [place];  Surgeon: Alphonsa Overall, MD;  Location: [place];  Service: General;  Laterality: N/A;  . TUBAL LIGATION      FAMILY HISTORY Family History  Problem Relation Age of Onset  . Diabetes Mother   . Arthritis Mother   . Hypertension Father   . Heart disease Father   . Hyperlipidemia Father   . Breast cancer Sister 55       came back 17 years later  . Stroke Maternal Uncle   . Stroke Maternal Grandmother   The patient's father died at the age of 14 from a brain aneurysm. The patient's mother died at age 69 following total hip replacement. The patient had 4 brothers, 5 sisters. One sister was diagnosed with breast cancer at age 73. There is no history of ovarian cancer in the family.  GYNECOLOGIC HISTORY:  No LMP recorded. Patient is not currently having periods (Reason: Perimenopausal). Menarche age 3, first live birth age 34, the patient is [place]  P2. She is perimenopausal and her most recent period was October 2017. She never used oral contraceptives.  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 Her husband [name] works in a Proofreader. Daughter [name] works for the [employer] in [place]. Son show more right works for the Glass blower/designer at  [employer] in [place]. The patient has 2 grandchildren. She attends a local [employer]: Not in place   HEALTH MAINTENANCE: Social History  Substance Use Topics  . Smoking status: Former Smoker    Quit date: 11/19/1994  . Smokeless tobacco: Never Used  . Alcohol use No     Colonoscopy:Never  PAP:2015  Bone density:Never   Allergies  Allergen Reactions  . Pineapple Anaphylaxis and Hives    Current Outpatient Prescriptions  Medication Sig Dispense Refill  . amLODipine (NORVASC) 5 MG tablet Take 5 mg by mouth daily.    Marland Kitchen dexamethasone (DECADRON) 4 MG tablet Take 4 mg by mouth as directed. Take 2 tabs (8 mg) after chemo, Then Take 2 tabs (8 mg) daily for 2 days  0  . glycopyrrolate (ROBINUL) 1 MG tablet Take 1 tablet (1 mg total) by mouth 3 (three) times daily as needed (excessive oral secretions). 30 tablet 0  . insulin aspart protamine- aspart (NOVOLOG MIX 70/30) (70-30) 100 UNIT/ML injection Inject 20 Units into the skin 2 (two) times daily with a meal.    . levofloxacin (LEVAQUIN) 750 MG tablet Take 1 tablet (750 mg total) by mouth daily. 4 tablet 0  . nystatin (MYCOSTATIN) 100000 UNIT/ML suspension Use as directed 5 mLs (500,000 Units total) in the mouth or throat 4 (four) times daily. 60 mL 0  . saccharomyces boulardii (FLORASTOR) 250 MG capsule Take 1 capsule (250 mg total) by mouth 2 (two) times daily. 60 capsule 0   No current facility-administered medications for this visit.     OBJECTIVE: Middle-aged African-American woman Who appears stated age  76:   04/09/17 1115  BP: 137/72  Pulse: 95  Resp: 20  Temp: 98.2 F (36.8 C)     Body mass index is 35.34 kg/m.    ECOG FS:1 - Symptomatic but completely ambulatory  Sclerae unicteric, EOMs intact Oropharynx clear and moist No cervical or supraclavicular adenopathy Lungs no rales or rhonchi Heart regular rate and rhythm Abd soft, nontender, positive bowel  sounds MSK no focal spinal tenderness, no upper extremity lymphedema Neuro: nonfocal, well oriented, appropriate affect Breasts: Deferred   LAB RESULTS:  CMP     Component Value Date/Time   NA 140 04/09/2017 1048   K 3.3 (L) 04/09/2017 1048   CL 111 03/18/2017 0445   CO2 30 (H) 04/09/2017 1048   GLUCOSE 341 (H) 04/09/2017 1048   GLUCOSE 333 (H) 09/05/2006 1306   BUN 12.6 04/09/2017 1048   CREATININE 1.3 (H) 04/09/2017 1048   CALCIUM 8.4 04/09/2017 1048   PROT 7.0 04/09/2017 1048   ALBUMIN 2.8 (L) 04/09/2017 1048   AST 25 04/09/2017 1048   ALT 18 04/09/2017 1048   ALKPHOS 72 04/09/2017 1048   BILITOT 0.35 04/09/2017 1048   GFRNONAA >60 03/18/2017 0445   GFRAA >60 03/18/2017 0445    INo results found for: SPEP, UPEP  Lab Results  Component Value Date   WBC 5.8 04/09/2017   NEUTROABS 5.0 04/09/2017   HGB 8.6 (L) 04/09/2017   HCT 25.5 (L) 04/09/2017  MCV 91.0 04/09/2017   PLT 156 04/09/2017      Chemistry      Component Value Date/Time   NA 140 04/09/2017 1048   K 3.3 (L) 04/09/2017 1048   CL 111 03/18/2017 0445   CO2 30 (H) 04/09/2017 1048   BUN 12.6 04/09/2017 1048   CREATININE 1.3 (H) 04/09/2017 1048      Component Value Date/Time   CALCIUM 8.4 04/09/2017 1048   ALKPHOS 72 04/09/2017 1048   AST 25 04/09/2017 1048   ALT 18 04/09/2017 1048   BILITOT 0.35 04/09/2017 1048       No results found for: LABCA2  No components found for: LABCA125  No results for input(s): INR in the last 168 hours.  Urinalysis    Component Value Date/Time   COLORURINE STRAW (A) 03/13/2017 2011   APPEARANCEUR CLEAR 03/13/2017 2011   LABSPEC 1.011 03/13/2017 2011   LABSPEC 1.010 02/07/2017 1005   PHURINE 7.0 03/13/2017 2011   GLUCOSEU NEGATIVE 03/13/2017 2011   GLUCOSEU Negative 02/07/2017 1005   HGBUR NEGATIVE 03/13/2017 2011   BILIRUBINUR NEGATIVE 03/13/2017 2011   BILIRUBINUR Negative 02/07/2017 [place] 03/13/2017 2011   PROTEINUR 100 (A)  03/13/2017 2011   UROBILINOGEN 0.2 02/07/2017 1005   NITRITE NEGATIVE 03/13/2017 2011   LEUKOCYTESUR NEGATIVE 03/13/2017 2011   LEUKOCYTESUR Negative 02/07/2017 1005     STUDIES: Dg Chest 2 View  Result Date: 03/13/2017 CLINICAL DATA:  Weakness.  History of breast cancer. EXAM: CHEST  2 VIEW COMPARISON:  02/09/2017 FINDINGS: The heart size and mediastinal contours are within normal limits. Port catheter tip is seen in the mid right atrium. Both lungs are clear. The visualized skeletal structures are unremarkable. The patient is status post ACDF of the lower cervical spine. IMPRESSION: No active cardiopulmonary disease. Port catheter tip in the mid right atrium on current exam, previously in the distal SVC -RA juncture. Electronically Signed   By: Ashley Royalty M.D.   On: 03/13/2017 20:39    ELIGIBLE FOR AVAILABLE RESEARCH PROTOCOL: No   ASSESSMENT: 51 y.o. [place] woman status post left breast upper inner quadrant biopsy 11/21/2016 for a clinical T1 cN0, stage IA invasive ductal carcinoma, grade 3, estrogen and progesterone receptor positive, HER-2 not amplified, with an MIB-1 of 90%  (1) Oncotype DX Score of 60 predicts a risk of recurrence outside the breast of 34% if the patient's only systemic therapy is tamoxifen for 5 years. On a similar database this risk drops to about 12% if chemotherapy is also given  (2) genetics testing sent 11/28/2016; The Breast/GYN gene panel offered by [facility] includes sequencing and rearrangement analysis for the following 23 genes:  ATM, BARD1, BRCA1, BRCA2, BRIP1, CDH1, CHEK2, EPCAM, FANCC, MLH1, MSH2, MSH6, MUTYH, NBN, NF1, PALB2, PMS2, POLD1, PTEN, RAD51C, RAD51D, RECQL, and TP53.   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    (3) status post left breast lumpectomy on 12/31/2016: invasive ductal carcinoma, 1.8cm, grade 3, margins negative, 1 SLN negative,  ER+(80%), PR-(2%), Ki-67 90%, HER-2 negative (ratio 1.05).  T1cN0  (4) chemotherapy consisting of cyclophosphamide and doxorubicin in dose dense fashion 4 started 01/17/17, completed 03/07/2017, to be followed by Abraxane weekly 12.  (a)  cycle 3 AC delayed because of febrile neutropenia  (b) febrile neutropenia also occurred after cycle 4 AC  (5) adjuvant radiation to follow as appropriate.  PLAN: Natsumi had some trouble  with the first cycle of Taxol. Partly this is the "first dose effect" and partly it is residual from her earlier chemotherapy, which was really difficult for her.  I do believe she will do better with cycle #2 we are proceeding with it today.  She will have a treatment next week, with no visit assuming all goes well, but if there are any problems she will request that I "drop-in" in the treatment area for evaluation. Otherwise I will see her again in 2 weeks from now  Plan continue his for a total of 12 weekly doses of paclitaxel  She knows to call for any other problems that may develop before her next visit here. Chauncey Cruel, MD   04/09/2017 11:28 AM Medical Oncology and Hematology [facility] [address] [place], [place] [address] Tel. [phone]    Fax. [phone]

## 2017-04-11 ENCOUNTER — Ambulatory Visit (HOSPITAL_BASED_OUTPATIENT_CLINIC_OR_DEPARTMENT_OTHER): Payer: BLUE CROSS/BLUE SHIELD

## 2017-04-11 VITALS — BP 151/76 | HR 106 | Temp 99.0°F | Resp 20

## 2017-04-11 DIAGNOSIS — C50212 Malignant neoplasm of upper-inner quadrant of left female breast: Secondary | ICD-10-CM | POA: Diagnosis not present

## 2017-04-11 DIAGNOSIS — Z170 Estrogen receptor positive status [ER+]: Principal | ICD-10-CM

## 2017-04-11 DIAGNOSIS — Z5111 Encounter for antineoplastic chemotherapy: Secondary | ICD-10-CM | POA: Diagnosis not present

## 2017-04-11 MED ORDER — HEPARIN SOD (PORK) LOCK FLUSH 100 UNIT/ML IV SOLN
500.0000 [IU] | Freq: Once | INTRAVENOUS | Status: AC | PRN
  Administered 2017-04-11: 500 [IU]
  Filled 2017-04-11: qty 5

## 2017-04-11 MED ORDER — PROCHLORPERAZINE MALEATE 10 MG PO TABS
10.0000 mg | ORAL_TABLET | Freq: Once | ORAL | Status: AC
  Administered 2017-04-11: 10 mg via ORAL

## 2017-04-11 MED ORDER — SODIUM CHLORIDE 0.9% FLUSH
10.0000 mL | INTRAVENOUS | Status: DC | PRN
  Administered 2017-04-11: 10 mL
  Filled 2017-04-11: qty 10

## 2017-04-11 MED ORDER — SODIUM CHLORIDE 0.9 % IV SOLN
Freq: Once | INTRAVENOUS | Status: AC
  Administered 2017-04-11: 15:00:00 via INTRAVENOUS

## 2017-04-11 MED ORDER — PROCHLORPERAZINE MALEATE 10 MG PO TABS
ORAL_TABLET | ORAL | Status: AC
  Filled 2017-04-11: qty 1

## 2017-04-11 MED ORDER — PACLITAXEL PROTEIN-BOUND CHEMO INJECTION 100 MG
100.0000 mg/m2 | Freq: Once | INTRAVENOUS | Status: AC
  Administered 2017-04-11: 200 mg via INTRAVENOUS
  Filled 2017-04-11: qty 40

## 2017-04-11 NOTE — Patient Instructions (Addendum)
[facility] Discharge Instructions for Patients Receiving Chemotherapy  Today you received the following chemotherapy agents Abraxane  To help prevent nausea and vomiting after your treatment, we encourage you to take your nausea medication    If you develop nausea and vomiting that is not controlled by your nausea medication, call the clinic.   BELOW ARE SYMPTOMS THAT SHOULD BE REPORTED IMMEDIATELY:  *FEVER GREATER THAN 100.5 F  *CHILLS WITH OR WITHOUT FEVER  NAUSEA AND VOMITING THAT IS NOT CONTROLLED WITH YOUR NAUSEA MEDICATION  *UNUSUAL SHORTNESS OF BREATH  *UNUSUAL BRUISING OR BLEEDING  TENDERNESS IN MOUTH AND THROAT WITH OR WITHOUT PRESENCE OF ULCERS  *URINARY PROBLEMS  *BOWEL PROBLEMS  UNUSUAL RASH Items with * indicate a potential emergency and should be followed up as soon as possible.  Feel free to call the clinic you have any questions or concerns. The clinic phone number is [phone].  Please show the CHEMO ALERT CARD at check-in to the Emergency Department and triage nurse.   

## 2017-04-17 ENCOUNTER — Other Ambulatory Visit: Payer: BLUE CROSS/BLUE SHIELD

## 2017-04-17 ENCOUNTER — Ambulatory Visit: Payer: BLUE CROSS/BLUE SHIELD

## 2017-04-18 ENCOUNTER — Ambulatory Visit (HOSPITAL_COMMUNITY)
Admission: RE | Admit: 2017-04-18 | Discharge: 2017-04-18 | Disposition: A | Discharge status: Home / Self Care | Payer: BLUE CROSS/BLUE SHIELD | Source: Ambulatory Visit | Attending: Oncology | Admitting: Oncology

## 2017-04-18 ENCOUNTER — Other Ambulatory Visit: Payer: Self-pay

## 2017-04-18 ENCOUNTER — Ambulatory Visit (HOSPITAL_BASED_OUTPATIENT_CLINIC_OR_DEPARTMENT_OTHER): Payer: BLUE CROSS/BLUE SHIELD

## 2017-04-18 ENCOUNTER — Ambulatory Visit: Payer: BLUE CROSS/BLUE SHIELD

## 2017-04-18 VITALS — BP 157/78 | HR 100 | Temp 99.8°F | Resp 18

## 2017-04-18 DIAGNOSIS — D649 Anemia, unspecified: Secondary | ICD-10-CM | POA: Insufficient documentation

## 2017-04-18 DIAGNOSIS — Z170 Estrogen receptor positive status [ER+]: Principal | ICD-10-CM

## 2017-04-18 DIAGNOSIS — C50212 Malignant neoplasm of upper-inner quadrant of left female breast: Secondary | ICD-10-CM | POA: Diagnosis not present

## 2017-04-18 DIAGNOSIS — Z5111 Encounter for antineoplastic chemotherapy: Secondary | ICD-10-CM | POA: Diagnosis not present

## 2017-04-18 LAB — CBC WITH DIFFERENTIAL/PLATELET
BASO%: 0.4 % (ref 0.0–2.0)
Basophils Absolute: 0 10*3/uL (ref 0.0–0.1)
EOS%: 2.1 % (ref 0.0–7.0)
Eosinophils Absolute: 0.1 10*3/uL (ref 0.0–0.5)
HCT: 22.5 % — ABNORMAL LOW (ref 34.8–46.6)
HGB: 7.6 g/dL — ABNORMAL LOW (ref 11.6–15.9)
LYMPH%: 16.5 % (ref 14.0–49.7)
MCH: 30.4 pg (ref 25.1–34.0)
MCHC: 33.8 g/dL (ref 31.5–36.0)
MCV: 90 fL (ref 79.5–101.0)
MONO#: 0.2 10*3/uL (ref 0.1–0.9)
MONO%: 5.3 % (ref 0.0–14.0)
NEUT%: 75.7 % (ref 38.4–76.8)
NEUTROS ABS: 2.2 10*3/uL (ref 1.5–6.5)
Platelets: 110 10*3/uL — ABNORMAL LOW (ref 145–400)
RBC: 2.5 10*6/uL — AB (ref 3.70–5.45)
RDW: 16.7 % — ABNORMAL HIGH (ref 11.2–14.5)
WBC: 2.8 10*3/uL — AB (ref 3.9–10.3)
lymph#: 0.5 10*3/uL — ABNORMAL LOW (ref 0.9–3.3)

## 2017-04-18 LAB — COMPREHENSIVE METABOLIC PANEL
ALBUMIN: 2.9 g/dL — AB (ref 3.5–5.0)
ALK PHOS: 73 U/L (ref 40–150)
ALT: 18 U/L (ref 0–55)
AST: 30 U/L (ref 5–34)
Anion Gap: 11 mEq/L (ref 3–11)
BUN: 13.7 mg/dL (ref 7.0–26.0)
CO2: 30 meq/L — AB (ref 22–29)
Calcium: 9 mg/dL (ref 8.4–10.4)
Chloride: 95 mEq/L — ABNORMAL LOW (ref 98–109)
Creatinine: 1.3 mg/dL — ABNORMAL HIGH (ref 0.6–1.1)
EGFR: 54 mL/min/{1.73_m2} — AB (ref >90)
GLUCOSE: 453 mg/dL — AB (ref 70–140)
Potassium: 3.2 mEq/L — ABNORMAL LOW (ref 3.5–5.1)
SODIUM: 136 meq/L (ref 136–145)
TOTAL PROTEIN: 7.2 g/dL (ref 6.4–8.3)
Total Bilirubin: 0.68 mg/dL (ref 0.20–1.20)

## 2017-04-18 MED ORDER — PROCHLORPERAZINE MALEATE 10 MG PO TABS
10.0000 mg | ORAL_TABLET | Freq: Once | ORAL | Status: AC
  Administered 2017-04-18: 10 mg via ORAL

## 2017-04-18 MED ORDER — PACLITAXEL PROTEIN-BOUND CHEMO INJECTION 100 MG
100.0000 mg/m2 | Freq: Once | INTRAVENOUS | Status: AC
  Administered 2017-04-18: 200 mg via INTRAVENOUS
  Filled 2017-04-18: qty 40

## 2017-04-18 MED ORDER — SODIUM CHLORIDE 0.9 % IV SOLN
Freq: Once | INTRAVENOUS | Status: AC
  Administered 2017-04-18: 15:00:00 via INTRAVENOUS

## 2017-04-18 MED ORDER — POTASSIUM CHLORIDE 10 MEQ/100ML IV SOLN
10.0000 meq | Freq: Once | INTRAVENOUS | Status: AC
  Administered 2017-04-18: 10 meq via INTRAVENOUS
  Filled 2017-04-18: qty 100

## 2017-04-18 MED ORDER — HEPARIN SOD (PORK) LOCK FLUSH 100 UNIT/ML IV SOLN
500.0000 [IU] | Freq: Once | INTRAVENOUS | Status: AC | PRN
  Administered 2017-04-18: 500 [IU]
  Filled 2017-04-18: qty 5

## 2017-04-18 MED ORDER — SODIUM CHLORIDE 0.9% FLUSH
10.0000 mL | INTRAVENOUS | Status: DC | PRN
Start: 2017-04-18 — End: 2017-04-18
  Administered 2017-04-18: 10 mL
  Filled 2017-04-18: qty 10

## 2017-04-18 MED ORDER — POTASSIUM CHLORIDE ER 10 MEQ PO TBCR: 10.0000 meq | EXTENDED_RELEASE_TABLET | Freq: Every day | ORAL | 0 refills | Status: DC

## 2017-04-18 MED ORDER — PROCHLORPERAZINE MALEATE 10 MG PO TABS
ORAL_TABLET | ORAL | Status: AC
  Filled 2017-04-18: qty 1

## 2017-04-18 NOTE — Progress Notes (Signed)
Okay to proceed with Abraxane per Dr.[name]. Pt hg 7.6 and is symptomatic. Will need 2 units of blood transfusion. Will call lab to obtain t/s patient and tubes samples to be collected in infusion. Notified infusion charge nurse.Okay to give 1 unit prbc today and 1 unit prbc tomorrow. Scheduling message sent.

## 2017-04-18 NOTE — Progress Notes (Signed)
Reviewed all labs and vitals with Dr. Virgie Dad RN.  Per Dr. Jana Hakim ok to treat today.  Pt will be set up to receive 2 units of blood.

## 2017-04-18 NOTE — Patient Instructions (Signed)
[facility] Discharge Instructions for Patients Receiving Chemotherapy  Today you received the following chemotherapy agents abraxane.   To help prevent nausea and vomiting after your treatment, we encourage you to take your nausea medication as directed.   If you develop nausea and vomiting that is not controlled by your nausea medication, call the clinic.   BELOW ARE SYMPTOMS THAT SHOULD BE REPORTED IMMEDIATELY:  *FEVER GREATER THAN 100.5 F  *CHILLS WITH OR WITHOUT FEVER  NAUSEA AND VOMITING THAT IS NOT CONTROLLED WITH YOUR NAUSEA MEDICATION  *UNUSUAL SHORTNESS OF BREATH  *UNUSUAL BRUISING OR BLEEDING  TENDERNESS IN MOUTH AND THROAT WITH OR WITHOUT PRESENCE OF ULCERS  *URINARY PROBLEMS  *BOWEL PROBLEMS  UNUSUAL RASH Items with * indicate a potential emergency and should be followed up as soon as possible.  Feel free to call the clinic you have any questions or concerns. The clinic phone number is [phone].  

## 2017-04-18 NOTE — Progress Notes (Signed)
Pt potassium 3.2. Dr.[name] wants pt to receive 10 meq run of potassium today. Sent in po potassium to take twice a day for 3 days, then once daily. Sent to [facility] pharmacy.Notified infusion nurse (pt notified)

## 2017-04-19 ENCOUNTER — Ambulatory Visit (HOSPITAL_BASED_OUTPATIENT_CLINIC_OR_DEPARTMENT_OTHER): Payer: BLUE CROSS/BLUE SHIELD

## 2017-04-19 DIAGNOSIS — D649 Anemia, unspecified: Secondary | ICD-10-CM | POA: Diagnosis not present

## 2017-04-19 LAB — PREPARE RBC (CROSSMATCH)

## 2017-04-19 MED ORDER — DIPHENHYDRAMINE HCL 25 MG PO CAPS
ORAL_CAPSULE | ORAL | Status: AC
  Filled 2017-04-19: qty 1

## 2017-04-19 MED ORDER — ACETAMINOPHEN 325 MG PO TABS
650.0000 mg | ORAL_TABLET | Freq: Once | ORAL | Status: AC
  Administered 2017-04-19: 650 mg via ORAL

## 2017-04-19 MED ORDER — SODIUM CHLORIDE 0.9 % IV SOLN
250.0000 mL | Freq: Once | INTRAVENOUS | Status: AC
  Administered 2017-04-19: 250 mL via INTRAVENOUS

## 2017-04-19 MED ORDER — ACETAMINOPHEN 325 MG PO TABS
ORAL_TABLET | ORAL | Status: AC
  Filled 2017-04-19: qty 2

## 2017-04-19 MED ORDER — HEPARIN SOD (PORK) LOCK FLUSH 100 UNIT/ML IV SOLN
500.0000 [IU] | Freq: Every day | INTRAVENOUS | Status: AC | PRN
  Administered 2017-04-19: 500 [IU]
  Filled 2017-04-19: qty 5

## 2017-04-19 MED ORDER — DIPHENHYDRAMINE HCL 25 MG PO CAPS
25.0000 mg | ORAL_CAPSULE | Freq: Once | ORAL | Status: AC
Start: 2017-04-19 — End: 2017-04-19
  Administered 2017-04-19: 25 mg via ORAL

## 2017-04-19 NOTE — Patient Instructions (Signed)

## 2017-04-22 ENCOUNTER — Encounter: Payer: Self-pay | Admitting: Oncology

## 2017-04-22 ENCOUNTER — Telehealth: Payer: Self-pay | Admitting: *Deleted

## 2017-04-22 ENCOUNTER — Other Ambulatory Visit: Payer: Self-pay | Admitting: Oncology

## 2017-04-22 LAB — BPAM RBC
BLOOD PRODUCT EXPIRATION DATE: 201806212359
Blood Product Expiration Date: 201806212359
ISSUE DATE / TIME: 201806011323
ISSUE DATE / TIME: 201806011323
UNIT TYPE AND RH: 5100
Unit Type and Rh: 5100

## 2017-04-22 LAB — TYPE AND SCREEN
ABO/RH(D): O POS
Antibody Screen: NEGATIVE
Unit division: 0
Unit division: 0

## 2017-04-22 NOTE — Telephone Encounter (Signed)
This RN spoke with Abigail Yoder per follow up on CMET with glucose reading of 463 on 04/18/2017.  This RN reviewed medications and verified Abigail Yoder is not taking the dexamethasone prescribed with prior chemo with Keatyn stating she is NOT taking the steroid.  Informed Abigail Yoder Dr Jana Hakim has sent lab and note to her primary MD for appropriate follow up with request per this office for Abigail Yoder to contact primary care office.  Aubry verbalized understanding.

## 2017-04-23 ENCOUNTER — Other Ambulatory Visit: Payer: BLUE CROSS/BLUE SHIELD

## 2017-04-23 ENCOUNTER — Ambulatory Visit: Payer: BLUE CROSS/BLUE SHIELD

## 2017-04-25 ENCOUNTER — Ambulatory Visit (HOSPITAL_BASED_OUTPATIENT_CLINIC_OR_DEPARTMENT_OTHER): Payer: BLUE CROSS/BLUE SHIELD

## 2017-04-25 ENCOUNTER — Other Ambulatory Visit: Payer: Self-pay | Admitting: *Deleted

## 2017-04-25 ENCOUNTER — Other Ambulatory Visit (HOSPITAL_BASED_OUTPATIENT_CLINIC_OR_DEPARTMENT_OTHER): Payer: BLUE CROSS/BLUE SHIELD

## 2017-04-25 ENCOUNTER — Ambulatory Visit (HOSPITAL_BASED_OUTPATIENT_CLINIC_OR_DEPARTMENT_OTHER): Payer: BLUE CROSS/BLUE SHIELD | Admitting: Oncology

## 2017-04-25 ENCOUNTER — Other Ambulatory Visit: Payer: Self-pay

## 2017-04-25 VITALS — BP 154/69 | HR 133 | Temp 99.0°F | Ht 63.5 in | Wt 188.9 lb

## 2017-04-25 DIAGNOSIS — C50212 Malignant neoplasm of upper-inner quadrant of left female breast: Secondary | ICD-10-CM

## 2017-04-25 DIAGNOSIS — D701 Agranulocytosis secondary to cancer chemotherapy: Secondary | ICD-10-CM | POA: Diagnosis not present

## 2017-04-25 DIAGNOSIS — Z170 Estrogen receptor positive status [ER+]: Principal | ICD-10-CM

## 2017-04-25 DIAGNOSIS — R000 Tachycardia, unspecified: Secondary | ICD-10-CM

## 2017-04-25 DIAGNOSIS — E860 Dehydration: Secondary | ICD-10-CM

## 2017-04-25 DIAGNOSIS — M2550 Pain in unspecified joint: Secondary | ICD-10-CM

## 2017-04-25 LAB — CBC WITH DIFFERENTIAL/PLATELET
BASO%: 0.7 % (ref 0.0–2.0)
Basophils Absolute: 0 10*3/uL (ref 0.0–0.1)
EOS ABS: 0 10*3/uL (ref 0.0–0.5)
EOS%: 2 % (ref 0.0–7.0)
HEMATOCRIT: 29.1 % — AB (ref 34.8–46.6)
HGB: 9.9 g/dL — ABNORMAL LOW (ref 11.6–15.9)
LYMPH#: 0.4 10*3/uL — AB (ref 0.9–3.3)
LYMPH%: 36.5 % (ref 14.0–49.7)
MCH: 29.8 pg (ref 25.1–34.0)
MCHC: 33.9 g/dL (ref 31.5–36.0)
MCV: 88 fL (ref 79.5–101.0)
MONO#: 0.1 10*3/uL (ref 0.1–0.9)
MONO%: 8.4 % (ref 0.0–14.0)
NEUT#: 0.6 10*3/uL — ABNORMAL LOW (ref 1.5–6.5)
NEUT%: 52.4 % (ref 38.4–76.8)
PLATELETS: 67 10*3/uL — AB (ref 145–400)
RBC: 3.31 10*6/uL — ABNORMAL LOW (ref 3.70–5.45)
RDW: 16.4 % — ABNORMAL HIGH (ref 11.2–14.5)
WBC: 1.1 10*3/uL — ABNORMAL LOW (ref 3.9–10.3)

## 2017-04-25 LAB — COMPREHENSIVE METABOLIC PANEL
ALT: 17 U/L (ref 0–55)
ANION GAP: 9 meq/L (ref 3–11)
AST: 30 U/L (ref 5–34)
Albumin: 2.7 g/dL — ABNORMAL LOW (ref 3.5–5.0)
Alkaline Phosphatase: 69 U/L (ref 40–150)
BUN: 13.1 mg/dL (ref 7.0–26.0)
CALCIUM: 9.4 mg/dL (ref 8.4–10.4)
CHLORIDE: 95 meq/L — AB (ref 98–109)
CO2: 31 mEq/L — ABNORMAL HIGH (ref 22–29)
CREATININE: 1.2 mg/dL — AB (ref 0.6–1.1)
EGFR: 58 mL/min/{1.73_m2} — AB (ref >90)
Glucose: 330 mg/dl — ABNORMAL HIGH (ref 70–140)
Potassium: 3.2 mEq/L — ABNORMAL LOW (ref 3.5–5.1)
Sodium: 135 mEq/L — ABNORMAL LOW (ref 136–145)
Total Bilirubin: 0.63 mg/dL (ref 0.20–1.20)
Total Protein: 7.4 g/dL (ref 6.4–8.3)

## 2017-04-25 LAB — MAGNESIUM: MAGNESIUM: 1 mg/dL — AB (ref 1.5–2.5)

## 2017-04-25 MED ORDER — SODIUM CHLORIDE 0.9% FLUSH
10.0000 mL | INTRAVENOUS | Status: AC | PRN
  Administered 2017-04-25: 10 mL
  Filled 2017-04-25: qty 10

## 2017-04-25 MED ORDER — HEPARIN SOD (PORK) LOCK FLUSH 100 UNIT/ML IV SOLN
500.0000 [IU] | INTRAVENOUS | Status: AC | PRN
  Administered 2017-04-25: 500 [IU]
  Filled 2017-04-25: qty 5

## 2017-04-25 MED ORDER — SODIUM CHLORIDE 0.9 % IV SOLN
1000.0000 mL | INTRAVENOUS | Status: DC
  Administered 2017-04-25: 1000 mL via INTRAVENOUS

## 2017-04-25 MED ORDER — SODIUM CHLORIDE 0.9 % IV SOLN: Freq: Once | INTRAVENOUS | Status: DC

## 2017-04-25 MED ORDER — ASPIRIN EC 81 MG PO TBEC: 81.0000 mg | DELAYED_RELEASE_TABLET | Freq: Every day | ORAL | 4 refills | Status: DC

## 2017-04-25 MED ORDER — ALTEPLASE 2 MG IJ SOLR
2.0000 mg | Freq: Once | INTRAMUSCULAR | Status: DC | PRN
  Filled 2017-04-25: qty 2

## 2017-04-25 MED ORDER — SODIUM CHLORIDE 0.9% FLUSH
10.0000 mL | INTRAVENOUS | Status: DC | PRN
  Filled 2017-04-25: qty 10

## 2017-04-25 MED ORDER — MAGNESIUM SULFATE 2 GM/50ML IV SOLN
2.0000 g | Freq: Once | INTRAVENOUS | Status: AC
  Administered 2017-04-25: 2 g via INTRAVENOUS
  Filled 2017-04-25: qty 50

## 2017-04-25 MED ORDER — NEBIVOLOL HCL 5 MG PO TABS: 5.0000 mg | ORAL_TABLET | Freq: Every day | ORAL | 4 refills | Status: DC

## 2017-04-25 MED ORDER — ANTICOAGULANT SODIUM CITRATE 4% (200MG/5ML) IV SOLN
5.0000 mL | Freq: Once | Status: DC
  Filled 2017-04-25: qty 5

## 2017-04-25 NOTE — Patient Instructions (Signed)
Dehydration, Adult Dehydration is a condition in which there is not enough fluid or water in the body. This happens when you lose more fluids than you take in. Important organs, such as the kidneys, brain, and heart, cannot function without a proper amount of fluids. Any loss of fluids from the body can lead to dehydration. Dehydration can range from mild to severe. This condition should be treated right away to prevent it from becoming severe. What are the causes? This condition may be caused by:  Vomiting.  Diarrhea.  Excessive sweating, such as from heat exposure or exercise.  Not drinking enough fluid, especially: ? When ill. ? While doing activity that requires a lot of energy.  Excessive urination.  Fever.  Infection.  Certain medicines, such as medicines that cause the body to lose excess fluid (diuretics).  Inability to access safe drinking water.  Reduced physical ability to get adequate water and food.  What increases the risk? This condition is more likely to develop in people:  Who have a poorly controlled long-term (chronic) illness, such as diabetes, heart disease, or kidney disease.  Who are age 65 or older.  Who are disabled.  Who live in a place with high altitude.  Who play endurance sports.  What are the signs or symptoms? Symptoms of mild dehydration may include:  Thirst.  Dry lips.  Slightly dry mouth.  Dry, warm skin.  Dizziness. Symptoms of moderate dehydration may include:  Very dry mouth.  Muscle cramps.  Dark urine. Urine may be the color of tea.  Decreased urine production.  Decreased tear production.  Heartbeat that is irregular or faster than normal (palpitations).  Headache.  Light-headedness, especially when you stand up from a sitting position.  Fainting (syncope). Symptoms of severe dehydration may include:  Changes in skin, such as: ? Cold and clammy skin. ? Blotchy (mottled) or pale skin. ? Skin that does  not quickly return to normal after being lightly pinched and released (poor skin turgor).  Changes in body fluids, such as: ? Extreme thirst. ? No tear production. ? Inability to sweat when body temperature is high, such as in hot weather. ? Very little urine production.  Changes in vital signs, such as: ? Weak pulse. ? Pulse that is more than 100 beats a minute when sitting still. ? Rapid breathing. ? Low blood pressure.  Other changes, such as: ? Sunken eyes. ? Cold hands and feet. ? Confusion. ? Lack of energy (lethargy). ? Difficulty waking up from sleep. ? Short-term weight loss. ? Unconsciousness. How is this diagnosed? This condition is diagnosed based on your symptoms and a physical exam. Blood and urine tests may be done to help confirm the diagnosis. How is this treated? Treatment for this condition depends on the severity. Mild or moderate dehydration can often be treated at home. Treatment should be started right away. Do not wait until dehydration becomes severe. Severe dehydration is an emergency and it needs to be treated in a hospital. Treatment for mild dehydration may include:  Drinking more fluids.  Replacing salts and minerals in your blood (electrolytes) that you may have lost. Treatment for moderate dehydration may include:  Drinking an oral rehydration solution (ORS). This is a drink that helps you replace fluids and electrolytes (rehydrate). It can be found at pharmacies and retail stores. Treatment for severe dehydration may include:  Receiving fluids through an IV tube.  Receiving an electrolyte solution through a feeding tube that is passed through your nose   and into your stomach (nasogastric tube, or NG tube).  Correcting any abnormalities in electrolytes.  Treating the underlying cause of dehydration. Follow these instructions at home:  If directed by your health care provider, drink an ORS: ? Make an ORS by following instructions on the  package. ? Start by drinking small amounts, about  cup (120 mL) every 5-10 minutes. ? Slowly increase how much you drink until you have taken the amount recommended by your health care provider.  Drink enough clear fluid to keep your urine clear or pale yellow. If you were told to drink an ORS, finish the ORS first, then start slowly drinking other clear fluids. Drink fluids such as: ? Water. Do not drink only water. Doing that can lead to having too little salt (sodium) in the body (hyponatremia). ? Ice chips. ? Fruit juice that you have added water to (diluted fruit juice). ? Low-calorie sports drinks.  Avoid: ? Alcohol. ? Drinks that contain a lot of sugar. These include high-calorie sports drinks, fruit juice that is not diluted, and soda. ? Caffeine. ? Foods that are greasy or contain a lot of fat or sugar.  Take over-the-counter and prescription medicines only as told by your health care provider.  Do not take sodium tablets. This can lead to having too much sodium in the body (hypernatremia).  Eat foods that contain a healthy balance of electrolytes, such as bananas, oranges, potatoes, tomatoes, and spinach.  Keep all follow-up visits as told by your health care provider. This is important. Contact a health care provider if:  You have abdominal pain that: ? Gets worse. ? Stays in one area (localizes).  You have a rash.  You have a stiff neck.  You are more irritable than usual.  You are sleepier or more difficult to wake up than usual.  You feel weak or dizzy.  You feel very thirsty.  You have urinated only a small amount of very dark urine over 6-8 hours. Get help right away if:  You have symptoms of severe dehydration.  You cannot drink fluids without vomiting.  Your symptoms get worse with treatment.  You have a fever.  You have a severe headache.  You have vomiting or diarrhea that: ? Gets worse. ? Does not go away.  You have blood or green matter  (bile) in your vomit.  You have blood in your stool. This may cause stool to look black and tarry.  You have not urinated in 6-8 hours.  You faint.  Your heart rate while sitting still is over 100 beats a minute.  You have trouble breathing. This information is not intended to replace advice given to you by your health care provider. Make sure you discuss any questions you have with your health care provider. Document Released: 11/05/2005 Document Revised: 06/01/2016 Document Reviewed: 12/30/2015 Elsevier Interactive Patient Education  2018 [employer].   Magnesium Sulfate injection What is this medicine? MAGNESIUM SULFATE (mag NEE zee um SUL fate) is an electrolyte injection commonly used to treat low magnesium levels in your blood. It is also used to prevent or control seizures in women with preeclampsia or eclampsia. This medicine may be used for other purposes; ask your health care provider or pharmacist if you have questions. What should I tell my health care provider before I take this medicine? They need to know if you have any of these conditions: -heart disease -history of irregular heart beat -kidney disease -an unusual or allergic reaction to magnesium  sulfate, medicines, foods, dyes, or preservatives -pregnant or trying to get pregnant -breast-feeding How should I use this medicine? This medicine is for infusion into a vein. It is given by a health care professional in a hospital or clinic setting. Talk to your pediatrician regarding the use of this medicine in children. While this drug may be prescribed for selected conditions, precautions do apply. Overdosage: If you think you have taken too much of this medicine contact a poison control center or emergency room at once. NOTE: This medicine is only for you. Do not share this medicine with others. What if I miss a dose? This does not apply. What may interact with this medicine? This medicine may interact with the  following medications: -certain medicines for anxiety or sleep -certain medicines for seizures like phenobarbital -digoxin -medicines that relax muscles for surgery -narcotic medicines for pain This list may not describe all possible interactions. Give your health care provider a list of all the medicines, herbs, non-prescription drugs, or dietary supplements you use. Also tell them if you smoke, drink alcohol, or use illegal drugs. Some items may interact with your medicine. What should I watch for while using this medicine? Your condition will be monitored carefully while you are receiving this medicine. You may need blood work done while you are receiving this medicine. What side effects may I notice from receiving this medicine? Side effects that you should report to your doctor or health care professional as soon as possible: -allergic reactions like skin rash, itching or hives, swelling of the face, lips, or tongue -facial flushing -muscle weakness -signs and symptoms of low blood pressure like dizziness; feeling faint or lightheaded, falls; unusually weak or tired -signs and symptoms of a dangerous change in heartbeat or heart rhythm like chest pain; dizziness; fast or irregular heartbeat; palpitations; breathing problems -sweating This list may not describe all possible side effects. Call your doctor for medical advice about side effects. You may report side effects to FDA at 1-800-FDA-1088. Where should I keep my medicine? This drug is given in a hospital or clinic and will not be stored at home. NOTE: This sheet is a summary. It may not cover all possible information. If you have questions about this medicine, talk to your doctor, pharmacist, or health care provider.  2018 Elsevier/Gold Standard (2016-05-23 12:31:42)

## 2017-04-25 NOTE — Addendum Note (Signed)
Addended by: Lurline Del on: 04/25/2017 07:01 PM   Modules accepted: Orders, SmartSet

## 2017-04-25 NOTE — Progress Notes (Addendum)
[place]  Telephone:([phone] Fax:(336) [phone]     ID: SHAWANNA ZANDERS DOB: [birth year]  MR#: [id number]  FHQ#:[id number]  Patient Care Team: [name], MD as PCP - General (Internal Medicine) Alphonsa Overall, MD as Consulting Physician (General Surgery) [name], Virgie Dad, MD as Consulting Physician (Oncology) Eppie Gibson, MD as Attending Physician (Radiation Oncology) Jacelyn Pi, MD as Consulting Physician (Endocrinology) Ardis Hughs, MD as Attending Physician (Urology) Chauncey Cruel, MD OTHER MD:  CHIEF COMPLAINT: Estrogen receptor positive breast cancer  CURRENT TREATMENT: adjuvant chemotherapy   BREAST CANCER HISTORY: From the original intake note:  Loisann had bilateral routine screening mammography with tomography at [facility] 11/14/2016. The breast density was category B. In the left breast upper inner quadrant there was a 1.5 cm high density mass which was further evaluated with ultrasonography 11/16/2016. This confirmed a 1.2 cm lobulated mass in the left breast upper inner quadrant. The left axilla was benign.  Biopsy of the left breast mass in question 11/21/2016 showed (SAA 18-59) invasive ductal carcinoma, grade 3, estrogen and progesterone receptor positive, HER-2 nonamplified, with a signals ratio of 1.05 and the number per cell 2.15, with an MIB-1 of 90%.  Her subsequent history is as detailed below  INTERVAL HISTORY: Caroleann returns today for follow-up of her estrogen receptor positive breast cancer accompanied her mother. Today would be day 1 cycle 4 of 12 planned cycles of weekly Abraxane. However her neutrophil count is less than 1000 and we will have to postpone her treatment  Her vitals today showed a heart rate in the 130-135 range, which was new for her.   REVIEW OF SYSTEMS: In addition, Ernestene does not feel well. She has no energy. She received 2 units of blood 04/22/2017 but that really did not help. She says ever since she  started the Abraxane she is been hurting "all over". She did not have these pains with the earlier chemotherapy which was admittedly harsher in other ways. She has had no intercurrent fever and no bleeding. A detailed review of systems today was otherwise stable  PAST MEDICAL HISTORY: Past Medical History:  Diagnosis Date  . Anemia   . Diabetes mellitus   . Family history of breast cancer   . Hypertension   . Obesity     PAST SURGICAL HISTORY: Past Surgical History:  Procedure Laterality Date  . APPENDECTOMY    . BREAST LUMPECTOMY WITH RADIOACTIVE SEED AND SENTINEL LYMPH NODE BIOPSY Left 12/31/2016   Procedure: BREAST LUMPECTOMY WITH RADIOACTIVE SEED AND SENTINEL LYMPH NODE BIOPSY;  Surgeon: Alphonsa Overall, MD;  Location: [facility];  Service: General;  Laterality: Left;  . CERVICAL SPINE SURGERY    . CESAREAN SECTION    . KNEE SURGERY Left   . PORTACATH PLACEMENT N/A 12/31/2016   Procedure: INSERTION PORT-A-CATH WITH [place];  Surgeon: Alphonsa Overall, MD;  Location: [facility];  Service: General;  Laterality: N/A;  . TUBAL LIGATION      FAMILY HISTORY Family History  Problem Relation Age of Onset  . Diabetes Mother   . Arthritis Mother   . Hypertension Father   . Heart disease Father   . Hyperlipidemia Father   . Breast cancer Sister 6       came back 17 years later  . Stroke Maternal Uncle   . Stroke Maternal Grandmother   The patient's father died at the age of 67 from a brain aneurysm. The patient's mother died at age 61 following total  hip replacement. The patient had 4 brothers, 5 sisters. One sister was diagnosed with breast cancer at age 57. There is no history of ovarian cancer in the family.  GYNECOLOGIC HISTORY:  No LMP recorded. Patient is not currently having periods (Reason: Perimenopausal). Menarche age 64, first live birth age 47, the patient is [place] P2. She is perimenopausal and her most recent period was October 2017. She never used oral  contraceptives.  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 Her husband [name] works in a Proofreader. Daughter [name] works for the [employer] in [place]. Son show more right works for the Glass blower/designer at [employer] in [place]. The patient has 2 grandchildren. She attends a local [place]: Not in place   HEALTH MAINTENANCE: Social History  Substance Use Topics  . Smoking status: Former Smoker    Quit date: 11/19/1994  . Smokeless tobacco: Never Used  . Alcohol use No     Colonoscopy:Never  PAP:2015  Bone density:Never   Allergies  Allergen Reactions  . Pineapple Anaphylaxis and Hives    Current Outpatient Prescriptions  Medication Sig Dispense Refill  . amLODipine (NORVASC) 5 MG tablet Take 5 mg by mouth daily.    Marland Kitchen aspirin EC 81 MG tablet Take 1 tablet (81 mg total) by mouth daily. 100 tablet 4  . glycopyrrolate (ROBINUL) 1 MG tablet Take 1 tablet (1 mg total) by mouth 3 (three) times daily as needed (excessive oral secretions). 30 tablet 0  . insulin aspart protamine- aspart (NOVOLOG MIX 70/30) (70-30) 100 UNIT/ML injection Inject 20 Units into the skin 2 (two) times daily with a meal.    . levofloxacin (LEVAQUIN) 750 MG tablet Take 1 tablet (750 mg total) by mouth daily. 4 tablet 0  . nebivolol (BYSTOLIC) 5 MG tablet Take 1 tablet (5 mg total) by mouth daily. 30 tablet 4  . nystatin (MYCOSTATIN) 100000 UNIT/ML suspension Use as directed 5 mLs (500,000 Units total) in the mouth or throat 4 (four) times daily. 60 mL 0  . potassium chloride (KLOR-CON 10) 10 MEQ tablet Take 1 tablet (10 mEq total) by mouth daily. Take 1 tablet twice a day for the first 3 days, then take it once daily. 30 tablet 0   No current facility-administered medications for this visit.    Facility-Administered Medications Ordered in Other Visits    Medication Dose Route Frequency Provider Last Rate Last Dose  . alteplase (CATHFLO ACTIVASE) injection 2 mg  2 mg Intracatheter Once PRN [name], Virgie Dad, MD      . anticoagulant sodium citrate solution 5 mL  5 mL Intracatheter Once [name], Virgie Dad, MD      . sodium chloride flush (NS) 0.9 % injection 10 mL  10 mL Intracatheter PRN [name], Virgie Dad, MD        OBJECTIVE:Middle-aged African-American woman who appears stated age  Vitals:   04/25/17 1343  BP: (!) 154/69  Pulse: (!) 133  Temp: 99 F (37.2 C)     Body mass index is 32.94 kg/m.    ECOG FS:1 - Symptomatic but completely ambulatory  Sclerae unicteric, EOMs intact Oropharynx clear and moist No cervical or supraclavicular adenopathy Lungs no rales or rhonchi Heart regular rate and rhythm Abd soft, nontender, positive bowel sounds MSK no focal spinal tenderness, no upper extremity lymphedema Neuro: nonfocal, well oriented, appropriate affect Breasts: Deferred  LAB  RESULTS:  CMP     Component Value Date/Time   NA 135 (L) 04/25/2017 1315   K 3.2 (L) 04/25/2017 1315   CL 111 03/18/2017 0445   CO2 31 (H) 04/25/2017 1315   GLUCOSE 330 (H) 04/25/2017 1315   GLUCOSE 333 (H) 09/05/2006 1306   BUN 13.1 04/25/2017 1315   CREATININE 1.2 (H) 04/25/2017 1315   CALCIUM 9.4 04/25/2017 1315   PROT 7.4 04/25/2017 1315   ALBUMIN 2.7 (L) 04/25/2017 1315   AST 30 04/25/2017 1315   ALT 17 04/25/2017 1315   ALKPHOS 69 04/25/2017 1315   BILITOT 0.63 04/25/2017 1315   GFRNONAA >60 03/18/2017 0445   GFRAA >60 03/18/2017 0445    INo results found for: SPEP, UPEP  Lab Results  Component Value Date   WBC 1.1 (L) 04/25/2017   NEUTROABS 0.6 (L) 04/25/2017   HGB 9.9 (L) 04/25/2017   HCT 29.1 (L) 04/25/2017   MCV 88.0 04/25/2017   PLT 67 (L) 04/25/2017      Chemistry      Component Value Date/Time   NA 135 (L) 04/25/2017 1315   K 3.2 (L) 04/25/2017 1315   CL 111 03/18/2017 0445   CO2 31 (H) 04/25/2017 1315   BUN  13.1 04/25/2017 1315   CREATININE 1.2 (H) 04/25/2017 1315      Component Value Date/Time   CALCIUM 9.4 04/25/2017 1315   ALKPHOS 69 04/25/2017 1315   AST 30 04/25/2017 1315   ALT 17 04/25/2017 1315   BILITOT 0.63 04/25/2017 1315       No results found for: LABCA2  No components found for: [id number]  No results for input(s): INR in the last 168 hours.  Urinalysis    Component Value Date/Time   COLORURINE STRAW (A) 03/13/2017 2011   APPEARANCEUR CLEAR 03/13/2017 2011   LABSPEC 1.011 03/13/2017 2011   LABSPEC 1.010 02/07/2017 1005   PHURINE 7.0 03/13/2017 2011   GLUCOSEU NEGATIVE 03/13/2017 2011   GLUCOSEU Negative 02/07/2017 1005   HGBUR NEGATIVE 03/13/2017 2011   BILIRUBINUR NEGATIVE 03/13/2017 2011   BILIRUBINUR Negative 02/07/2017 [place] 03/13/2017 2011   PROTEINUR 100 (A) 03/13/2017 2011   UROBILINOGEN 0.2 02/07/2017 1005   NITRITE NEGATIVE 03/13/2017 2011   LEUKOCYTESUR NEGATIVE 03/13/2017 2011   LEUKOCYTESUR Negative 02/07/2017 1005     STUDIES: Electrocardiogram today shows a heart rate of 128, showing sinus tachycardia with premature atrial complexes. There are nonspecific T wave abnormalities.  ELIGIBLE FOR AVAILABLE RESEARCH PROTOCOL: No   ASSESSMENT: 51 y.o. [place] woman status post left breast upper inner quadrant biopsy 11/21/2016 for a clinical T1 cN0, stage IA invasive ductal carcinoma, grade 3, estrogen and progesterone receptor positive, HER-2 not amplified, with an MIB-1 of 90%  (1) Oncotype DX Score of 60 predicts a risk of recurrence outside the breast of 34% if the patient's only systemic therapy is tamoxifen for 5 years. On a similar database this risk drops to about 12% if chemotherapy is also given  (2) genetics testing sent 11/28/2016; The Breast/GYN gene panel offered by [facility] includes sequencing and rearrangement analysis for the following 23 genes:  ATM, BARD1, BRCA1, BRCA2, BRIP1, CDH1, CHEK2, EPCAM, FANCC, MLH1,  MSH2, MSH6, MUTYH, NBN, NF1, PALB2, PMS2, POLD1, PTEN, RAD51C, RAD51D, RECQL, and TP53.   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    (3) status post left breast lumpectomy on 12/31/2016: invasive ductal  carcinoma, 1.8cm, grade 3, margins negative, 1 SLN negative, ER+(80%), PR-(2%), Ki-67 90%, HER-2 negative (ratio 1.05).  T1cN0  (4) chemotherapy consisting of cyclophosphamide and doxorubicin in dose dense fashion 4 started 01/17/17, completed 03/07/2017, to be followed by Abraxane weekly 12.  (a)  cycle 3 AC delayed because of febrile neutropenia  (b) febrile neutropenia also occurred after cycle 4 AC  (5) adjuvant radiation to follow chemotherapy  (6) anti-estrogens to follow at the completion of local treatment  PLAN: We are holding penis treatment today partly because of her symptomatology but more objectively because of her low counts.  She has developed significant arthralgias from her chemotherapy although no peripheral neuropathy. It may be that adding dexamethasone back to her antinausea medications may allow her to continue these treatments more comfortably  I am concerned regarding her heart rate. We checked a magnesium level and it was low. She is having magnesium supplementation. Today I started her on aspirin and diastolic. She will let me know she has any problems from these medications. Otherwise she is already scheduled for a treatment and visit with lab work again in 7 days.  At that time we will check a TSH as well as repeat her magnesium studies with routine labs  She knows to call for any other issues that may develop before then.  Chauncey Cruel, MD   04/25/2017 6:52 PM Medical Oncology and Hematology [facility] [address] [place], [place] [address] Tel. [phone]    Fax. [phone]

## 2017-04-29 ENCOUNTER — Encounter: Payer: Self-pay | Admitting: Oncology

## 2017-05-01 NOTE — Progress Notes (Signed)
[place]  Telephone:(336) [phone] Fax:(336) [phone]     ID: Abigail Yoder DOB: 1965-11-29  MR#: [id number]  OYD#:[id number]  Patient Care Team: [name], MD as PCP - General (Internal Medicine) Alphonsa Overall, MD as Consulting Physician (General Surgery) [name], Virgie Dad, MD as Consulting Physician (Oncology) Eppie Gibson, MD as Attending Physician (Radiation Oncology) Jacelyn Pi, MD as Consulting Physician (Endocrinology) Ardis Hughs, MD as Attending Physician (Urology) Scot Dock, NP OTHER MD:  CHIEF COMPLAINT: Estrogen receptor positive breast cancer  CURRENT TREATMENT: adjuvant chemotherapy   BREAST CANCER HISTORY: From the original intake note:  Abigail Yoder had bilateral routine screening mammography with tomography at [facility] 11/14/2016. The breast density was category B. In the left breast upper inner quadrant there was a 1.5 cm high density mass which was further evaluated with ultrasonography 11/16/2016. This confirmed a 1.2 cm lobulated mass in the left breast upper inner quadrant. The left axilla was benign.  Biopsy of the left breast mass in question 11/21/2016 showed (SAA 18-59) invasive ductal carcinoma, grade 3, estrogen and progesterone receptor positive, HER-2 nonamplified, with a signals ratio of 1.05 and the number per cell 2.15, with an MIB-1 of 90%.  Her subsequent history is as detailed below  INTERVAL HISTORY: Abigail Yoder is here today prior to week 4 of Abraxane.  She is wanting to take a week off if she gets chemotherapy this week.  She is fatigued, and says with multiple treatments it tires her out.  She has been taking potassium supplementation BID and her potassium has improved.  She is not taking any magnesium.    REVIEW OF SYSTEMS: Abigail Yoder denies fevers, chills.  She has noted a cough that is non productive since starting chemotherapy.  It is worse when she lies on her left side, or while sitting up. She notes it all throughout  the day.  She also notes reflux.    PAST MEDICAL HISTORY: Past Medical History:  Diagnosis Date  . Anemia   . Diabetes mellitus   . Family history of breast cancer   . Hypertension   . Obesity     PAST SURGICAL HISTORY: Past Surgical History:  Procedure Laterality Date  . APPENDECTOMY    . BREAST LUMPECTOMY WITH RADIOACTIVE SEED AND SENTINEL LYMPH NODE BIOPSY Left 12/31/2016   Procedure: BREAST LUMPECTOMY WITH RADIOACTIVE SEED AND SENTINEL LYMPH NODE BIOPSY;  Surgeon: Alphonsa Overall, MD;  Location: [facility];  Service: General;  Laterality: Left;  . CERVICAL SPINE SURGERY    . CESAREAN SECTION    . KNEE SURGERY Left   . PORTACATH PLACEMENT N/A 12/31/2016   Procedure: INSERTION PORT-A-CATH WITH [place];  Surgeon: Alphonsa Overall, MD;  Location: [facility];  Service: General;  Laterality: N/A;  . TUBAL LIGATION      FAMILY HISTORY Family History  Problem Relation Age of Onset  . Diabetes Mother   . Arthritis Mother   . Hypertension Father   . Heart disease Father   . Hyperlipidemia Father   . Breast cancer Sister 9       came back 17 years later  . Stroke Maternal Uncle   . Stroke Maternal Grandmother   The patient's father died at the age of 67 from a brain aneurysm. The patient's mother died at age 58 following total hip replacement. The patient had 4 brothers, 5 sisters. One sister was diagnosed with breast cancer at age 68. There is no history of ovarian cancer in the family.  GYNECOLOGIC HISTORY:  No LMP recorded. Patient is not currently having periods (Reason: Perimenopausal). Menarche age 85, first live birth age 76, the patient is [place] P2. She is perimenopausal and her most recent period was October 2017. She never used oral contraceptives.  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 Her husband [name] works in a Proofreader. Daughter [name] works for the [employer] in [place]. Son show more right works for the Glass blower/designer at [employer] in [place]. The patient has 2 grandchildren. She attends a local [employer]: Not in place   HEALTH MAINTENANCE: Social History  Substance Use Topics  . Smoking status: Former Smoker    Quit date: 11/19/1994  . Smokeless tobacco: Never Used  . Alcohol use No     Colonoscopy:Never  PAP:2015  Bone density:Never   Allergies  Allergen Reactions  . Pineapple Anaphylaxis and Hives    Current Outpatient Prescriptions  Medication Sig Dispense Refill  . amLODipine (NORVASC) 5 MG tablet Take 5 mg by mouth daily.    Marland Kitchen aspirin EC 81 MG tablet Take 1 tablet (81 mg total) by mouth daily. 100 tablet 4  . glycopyrrolate (ROBINUL) 1 MG tablet Take 1 tablet (1 mg total) by mouth 3 (three) times daily as needed (excessive oral secretions). 30 tablet 0  . insulin aspart protamine- aspart (NOVOLOG MIX 70/30) (70-30) 100 UNIT/ML injection Inject 20 Units into the skin 2 (two) times daily with a meal.    . levofloxacin (LEVAQUIN) 750 MG tablet Take 1 tablet (750 mg total) by mouth daily. 4 tablet 0  . nebivolol (BYSTOLIC) 5 MG tablet Take 1 tablet (5 mg total) by mouth daily. 30 tablet 4  . nystatin (MYCOSTATIN) 100000 UNIT/ML suspension Use as directed 5 mLs (500,000 Units total) in the mouth or throat 4 (four) times daily. 60 mL 0  . potassium chloride (KLOR-CON 10) 10 MEQ tablet Take 1 tablet (10 mEq total) by mouth daily. Take 1 tablet twice a day for the first 3 days, then take it once daily. 30 tablet 0   No current facility-administered medications for this visit.     OBJECTIVE:  Vitals:   05/02/17 1148  BP: 138/66  Pulse: (!) 101  Resp: 20  Temp: 98.9 F (37.2 C)     Body mass index is 34.56 kg/m.    ECOG FS:1 - Symptomatic but completely ambulatory GENERAL: Patient is a well appearing female in no acute distress HEENT:  Sclerae anicteric.   Oropharynx clear and moist. No ulcerations or evidence of oropharyngeal candidiasis. Neck is supple.  NODES:  No cervical, supraclavicular, or axillary lymphadenopathy palpated.  BREAST EXAM:  Deferred. LUNGS:  Clear to auscultation bilaterally.  No wheezes or rhonchi. HEART:  Regular rate and rhythm. No murmur appreciated. ABDOMEN:  Soft, nontender.  Positive, normoactive bowel sounds. No organomegaly palpated. MSK:  No focal spinal tenderness to palpation. Full range of motion bilaterally in the upper extremities. EXTREMITIES:  No peripheral edema.   SKIN:  Clear with no obvious rashes or skin changes. No nail dyscrasia. NEURO:  Nonfocal. Well oriented.  Appropriate affect.   LAB RESULTS:  CMP     Component Value Date/Time   NA 141 05/02/2017 1137   K 3.5 05/02/2017 1137   CL 111 03/18/2017 0445   CO2 29 05/02/2017 1137   GLUCOSE 160 (H) 05/02/2017 1137   GLUCOSE  333 (H) 09/05/2006 1306   BUN 9.3 05/02/2017 1137   CREATININE 1.0 05/02/2017 1137   CALCIUM 9.2 05/02/2017 1137   PROT 6.8 05/02/2017 1137   ALBUMIN 2.5 (L) 05/02/2017 1137   AST 27 05/02/2017 1137   ALT 11 05/02/2017 1137   ALKPHOS 73 05/02/2017 1137   BILITOT 0.35 05/02/2017 1137   GFRNONAA >60 03/18/2017 0445   GFRAA >60 03/18/2017 0445    INo results found for: SPEP, UPEP  Lab Results  Component Value Date   WBC 5.3 05/02/2017   NEUTROABS 4.3 05/02/2017   HGB 8.8 (L) 05/02/2017   HCT 26.1 (L) 05/02/2017   MCV 89.7 05/02/2017   PLT 135 (L) 05/02/2017      Chemistry      Component Value Date/Time   NA 141 05/02/2017 1137   K 3.5 05/02/2017 1137   CL 111 03/18/2017 0445   CO2 29 05/02/2017 1137   BUN 9.3 05/02/2017 1137   CREATININE 1.0 05/02/2017 1137      Component Value Date/Time   CALCIUM 9.2 05/02/2017 1137   ALKPHOS 73 05/02/2017 1137   AST 27 05/02/2017 1137   ALT 11 05/02/2017 1137   BILITOT 0.35 05/02/2017 1137       No results found for: LABCA2  No components found for:  LABCA125  No results for input(s): INR in the last 168 hours.  Urinalysis    Component Value Date/Time   COLORURINE STRAW (A) 03/13/2017 2011   APPEARANCEUR CLEAR 03/13/2017 2011   LABSPEC 1.011 03/13/2017 2011   LABSPEC 1.010 02/07/2017 1005   PHURINE 7.0 03/13/2017 2011   GLUCOSEU NEGATIVE 03/13/2017 2011   GLUCOSEU Negative 02/07/2017 1005   HGBUR NEGATIVE 03/13/2017 2011   BILIRUBINUR NEGATIVE 03/13/2017 2011   BILIRUBINUR Negative 02/07/2017 [place] 03/13/2017 2011   PROTEINUR 100 (A) 03/13/2017 2011   UROBILINOGEN 0.2 02/07/2017 1005   NITRITE NEGATIVE 03/13/2017 2011   LEUKOCYTESUR NEGATIVE 03/13/2017 2011   LEUKOCYTESUR Negative 02/07/2017 1005     STUDIES: Electrocardiogram today shows a heart rate of 128, showing sinus tachycardia with premature atrial complexes. There are nonspecific T wave abnormalities.  ELIGIBLE FOR AVAILABLE RESEARCH PROTOCOL: No   ASSESSMENT: 51 y.o. [place] woman status post left breast upper inner quadrant biopsy 11/21/2016 for a clinical T1 cN0, stage IA invasive ductal carcinoma, grade 3, estrogen and progesterone receptor positive, HER-2 not amplified, with an MIB-1 of 90%  (1) Oncotype DX Score of 60 predicts a risk of recurrence outside the breast of 34% if the patient's only systemic therapy is tamoxifen for 5 years. On a similar database this risk drops to about 12% if chemotherapy is also given  (2) genetics testing sent 11/28/2016; The Breast/GYN gene panel offered by [facility] includes sequencing and rearrangement analysis for the following 23 genes:  ATM, BARD1, BRCA1, BRCA2, BRIP1, CDH1, CHEK2, EPCAM, FANCC, MLH1, MSH2, MSH6, MUTYH, NBN, NF1, PALB2, PMS2, POLD1, PTEN, RAD51C, RAD51D, RECQL, and TP53.   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    (3) status post left breast lumpectomy on 12/31/2016: invasive ductal  carcinoma, 1.8cm, grade 3, margins negative, 1 SLN negative, ER+(80%), PR-(2%), Ki-67 90%, HER-2 negative (ratio 1.05).  T1cN0  (4) chemotherapy consisting of cyclophosphamide and doxorubicin in dose dense fashion 4 started 01/17/17, completed 03/07/2017, to be followed by Abraxane weekly 12.  (a)  cycle 3 AC delayed because of febrile neutropenia  (  b) febrile neutropenia also occurred after cycle 4 AC  (5) adjuvant radiation to follow chemotherapy  (6) anti-estrogens to follow at the completion of local treatment  PLAN: Liseth remains fatigued.  She does not have any peripheral neuropathy, which is a good thing.  Her heart rate is improved today.  However, since she is not feeling 100%, we will hold chemotherapy again another week and give her IV fluids with magnesium today.  She will return next week and we will evaluate whether or not to resume her Abraxane.  I reviewed this plan with her in detail and she is in agreement with it.   She knows to call for any other issues that may develop before then.  A total of (30) minutes of face-to-face time was spent with this patient with greater than 50% of that time in counseling and care-coordination.  Scot Dock, NP   05/02/2017 2:57 PM Medical Oncology and Hematology [facility] [address] [place],  [address] Tel. [phone]    Fax. [phone]

## 2017-05-02 ENCOUNTER — Encounter: Payer: Self-pay | Admitting: Adult Health

## 2017-05-02 ENCOUNTER — Ambulatory Visit (HOSPITAL_BASED_OUTPATIENT_CLINIC_OR_DEPARTMENT_OTHER): Payer: BLUE CROSS/BLUE SHIELD | Admitting: Adult Health

## 2017-05-02 ENCOUNTER — Other Ambulatory Visit (HOSPITAL_BASED_OUTPATIENT_CLINIC_OR_DEPARTMENT_OTHER): Payer: BLUE CROSS/BLUE SHIELD

## 2017-05-02 ENCOUNTER — Encounter: Payer: Self-pay | Admitting: *Deleted

## 2017-05-02 ENCOUNTER — Ambulatory Visit (HOSPITAL_BASED_OUTPATIENT_CLINIC_OR_DEPARTMENT_OTHER): Payer: BLUE CROSS/BLUE SHIELD

## 2017-05-02 VITALS — BP 138/66 | HR 101 | Temp 98.9°F | Resp 20 | Ht 63.5 in | Wt 198.2 lb

## 2017-05-02 VITALS — BP 129/66 | HR 93 | Temp 99.0°F | Resp 18

## 2017-05-02 DIAGNOSIS — Z170 Estrogen receptor positive status [ER+]: Principal | ICD-10-CM

## 2017-05-02 DIAGNOSIS — C50212 Malignant neoplasm of upper-inner quadrant of left female breast: Secondary | ICD-10-CM

## 2017-05-02 DIAGNOSIS — Z95828 Presence of other vascular implants and grafts: Secondary | ICD-10-CM

## 2017-05-02 DIAGNOSIS — R5383 Other fatigue: Secondary | ICD-10-CM

## 2017-05-02 DIAGNOSIS — E860 Dehydration: Secondary | ICD-10-CM

## 2017-05-02 LAB — CBC WITH DIFFERENTIAL/PLATELET
BASO%: 0.4 % (ref 0.0–2.0)
BASOS ABS: 0 10*3/uL (ref 0.0–0.1)
EOS ABS: 0 10*3/uL (ref 0.0–0.5)
EOS%: 0.4 % (ref 0.0–7.0)
HCT: 26.1 % — ABNORMAL LOW (ref 34.8–46.6)
HGB: 8.8 g/dL — ABNORMAL LOW (ref 11.6–15.9)
LYMPH%: 11.1 % — AB (ref 14.0–49.7)
MCH: 30.2 pg (ref 25.1–34.0)
MCHC: 33.7 g/dL (ref 31.5–36.0)
MCV: 89.7 fL (ref 79.5–101.0)
MONO#: 0.4 10*3/uL (ref 0.1–0.9)
MONO%: 8.2 % (ref 0.0–14.0)
NEUT%: 79.9 % — AB (ref 38.4–76.8)
NEUTROS ABS: 4.3 10*3/uL (ref 1.5–6.5)
PLATELETS: 135 10*3/uL — AB (ref 145–400)
RBC: 2.91 10*6/uL — AB (ref 3.70–5.45)
RDW: 17.6 % — ABNORMAL HIGH (ref 11.2–14.5)
WBC: 5.3 10*3/uL (ref 3.9–10.3)
lymph#: 0.6 10*3/uL — ABNORMAL LOW (ref 0.9–3.3)

## 2017-05-02 LAB — MAGNESIUM: Magnesium: 1 mg/dl — CL (ref 1.5–2.5)

## 2017-05-02 LAB — TSH: TSH: 0.563 m(IU)/L (ref 0.308–3.960)

## 2017-05-02 LAB — COMPREHENSIVE METABOLIC PANEL
ALT: 11 U/L (ref 0–55)
ANION GAP: 10 meq/L (ref 3–11)
AST: 27 U/L (ref 5–34)
Albumin: 2.5 g/dL — ABNORMAL LOW (ref 3.5–5.0)
Alkaline Phosphatase: 73 U/L (ref 40–150)
BILIRUBIN TOTAL: 0.35 mg/dL (ref 0.20–1.20)
BUN: 9.3 mg/dL (ref 7.0–26.0)
CALCIUM: 9.2 mg/dL (ref 8.4–10.4)
CHLORIDE: 102 meq/L (ref 98–109)
CO2: 29 mEq/L (ref 22–29)
CREATININE: 1 mg/dL (ref 0.6–1.1)
EGFR: 73 mL/min/{1.73_m2} — AB (ref >90)
Glucose: 160 mg/dl — ABNORMAL HIGH (ref 70–140)
Potassium: 3.5 mEq/L (ref 3.5–5.1)
Sodium: 141 mEq/L (ref 136–145)
Total Protein: 6.8 g/dL (ref 6.4–8.3)

## 2017-05-02 MED ORDER — SODIUM CHLORIDE 0.9% FLUSH
10.0000 mL | INTRAVENOUS | Status: AC | PRN
  Administered 2017-05-02: 10 mL
  Filled 2017-05-02: qty 10

## 2017-05-02 MED ORDER — HEPARIN SOD (PORK) LOCK FLUSH 100 UNIT/ML IV SOLN
500.0000 [IU] | INTRAVENOUS | Status: AC | PRN
  Administered 2017-05-02: 500 [IU]
  Filled 2017-05-02: qty 5

## 2017-05-02 MED ORDER — SODIUM CHLORIDE 0.9 % IV SOLN
Freq: Once | INTRAVENOUS | Status: AC
  Administered 2017-05-02: 14:00:00 via INTRAVENOUS
  Filled 2017-05-02: qty 1000

## 2017-05-02 NOTE — Patient Instructions (Signed)
Hypomagnesemia  Hypomagnesemia is a condition in which the level of magnesium in the blood is low. Magnesium is a mineral that is found in many foods. It is used in many different processes in the body. Hypomagnesemia can affect every organ in the body. It can cause life-threatening problems.  What are the causes?  Causes of hypomagnesemia include:   Not getting enough magnesium in your diet.   Malnutrition.   Problems with absorbing magnesium from the intestines.   Dehydration.   Alcohol abuse.   Vomiting.   Severe diarrhea.   Some medicines, including medicines that make you urinate more.   Certain diseases, such as kidney disease, diabetes, and overactive thyroid.    What are the signs or symptoms?   Involuntary shaking or trembling of a body part (tremor).   Confusion.   Muscle weakness.   Sensitivity to light, sound, and touch.   Psychiatric issues, such as depression, irritability, or psychosis.   Sudden tightening of muscles (muscle spasms).   Tingling in the arms and legs.   A feeling of fluttering of the heart.  These symptoms are more severe if magnesium levels drop suddenly.  How is this diagnosed?  To make a diagnosis, your health care provider will do a physical exam and order blood and urine tests.  How is this treated?  Treatment will depend on the cause and the severity of your condition. It may involve:   A magnesium supplement. This can be taken in pill form. It can also be given through an IV tube. This is usually done if the condition is severe.   Changes to your diet. You may be directed to eat foods that have a lot of magnesium, such as green leafy vegetables, peas, beans, and nuts.   Eliminating alcohol from your diet.    Follow these instructions at home:   Include foods with magnesium in your diet. Foods that are rich in magnesium include green vegetables, beans, nuts and seeds, and whole grains.   Take medicines only as directed by your health care provider.   Take  magnesium supplements if your health care provider instructs you to do that. Take them as directed.   Have your magnesium levels monitored as directed by your health care provider.   When you are active, drink fluids that contain electrolytes.   Keep all follow-up visits as directed by your health care provider. This is important.  Contact a health care provider if:   You get worse instead of better.   Your symptoms return.  Get help right away if:   Your symptoms are severe.  This information is not intended to replace advice given to you by your health care provider. Make sure you discuss any questions you have with your health care provider.  Document Released: 08/01/2005 Document Revised: 04/12/2016 Document Reviewed: 06/21/2014  Elsevier Interactive Patient Education  2018 [employer]vier Inc[employer]

## 2017-05-16 ENCOUNTER — Ambulatory Visit (HOSPITAL_BASED_OUTPATIENT_CLINIC_OR_DEPARTMENT_OTHER): Payer: BLUE CROSS/BLUE SHIELD

## 2017-05-16 ENCOUNTER — Encounter: Payer: Self-pay | Admitting: *Deleted

## 2017-05-16 ENCOUNTER — Other Ambulatory Visit: Payer: Self-pay | Admitting: *Deleted

## 2017-05-16 ENCOUNTER — Other Ambulatory Visit (HOSPITAL_BASED_OUTPATIENT_CLINIC_OR_DEPARTMENT_OTHER): Payer: BLUE CROSS/BLUE SHIELD

## 2017-05-16 ENCOUNTER — Ambulatory Visit (HOSPITAL_BASED_OUTPATIENT_CLINIC_OR_DEPARTMENT_OTHER): Payer: BLUE CROSS/BLUE SHIELD | Admitting: Oncology

## 2017-05-16 VITALS — BP 168/77 | HR 101 | Temp 98.8°F | Resp 18 | Ht 63.5 in | Wt 190.7 lb

## 2017-05-16 DIAGNOSIS — Z170 Estrogen receptor positive status [ER+]: Principal | ICD-10-CM

## 2017-05-16 DIAGNOSIS — Z5111 Encounter for antineoplastic chemotherapy: Secondary | ICD-10-CM

## 2017-05-16 DIAGNOSIS — C50212 Malignant neoplasm of upper-inner quadrant of left female breast: Secondary | ICD-10-CM | POA: Diagnosis not present

## 2017-05-16 LAB — COMPREHENSIVE METABOLIC PANEL
ALT: 17 U/L (ref 0–55)
AST: 32 U/L (ref 5–34)
Albumin: 2.6 g/dL — ABNORMAL LOW (ref 3.5–5.0)
Alkaline Phosphatase: 82 U/L (ref 40–150)
Anion Gap: 8 mEq/L (ref 3–11)
BUN: 9 mg/dL (ref 7.0–26.0)
CALCIUM: 9.3 mg/dL (ref 8.4–10.4)
CHLORIDE: 105 meq/L (ref 98–109)
CO2: 26 meq/L (ref 22–29)
Creatinine: 1 mg/dL (ref 0.6–1.1)
EGFR: 79 mL/min/{1.73_m2} — ABNORMAL LOW (ref >90)
Glucose: 223 mg/dl — ABNORMAL HIGH (ref 70–140)
POTASSIUM: 3.5 meq/L (ref 3.5–5.1)
SODIUM: 138 meq/L (ref 136–145)
Total Bilirubin: 0.35 mg/dL (ref 0.20–1.20)
Total Protein: 7.8 g/dL (ref 6.4–8.3)

## 2017-05-16 LAB — CBC WITH DIFFERENTIAL/PLATELET
BASO%: 0.4 % (ref 0.0–2.0)
BASOS ABS: 0 10*3/uL (ref 0.0–0.1)
EOS%: 5.1 % (ref 0.0–7.0)
Eosinophils Absolute: 0.3 10*3/uL (ref 0.0–0.5)
HEMATOCRIT: 26.8 % — AB (ref 34.8–46.6)
HGB: 8.6 g/dL — ABNORMAL LOW (ref 11.6–15.9)
LYMPH%: 24 % (ref 14.0–49.7)
MCH: 30.7 pg (ref 25.1–34.0)
MCHC: 32.1 g/dL (ref 31.5–36.0)
MCV: 95.7 fL (ref 79.5–101.0)
MONO#: 0.4 10*3/uL (ref 0.1–0.9)
MONO%: 7.7 % (ref 0.0–14.0)
NEUT#: 3.4 10*3/uL (ref 1.5–6.5)
NEUT%: 62.8 % (ref 38.4–76.8)
Platelets: 124 10*3/uL — ABNORMAL LOW (ref 145–400)
RBC: 2.8 10*6/uL — AB (ref 3.70–5.45)
RDW: 19.5 % — ABNORMAL HIGH (ref 11.2–14.5)
WBC: 5.5 10*3/uL (ref 3.9–10.3)
lymph#: 1.3 10*3/uL (ref 0.9–3.3)

## 2017-05-16 LAB — MAGNESIUM: MAGNESIUM: 1.2 mg/dL — AB (ref 1.5–2.5)

## 2017-05-16 MED ORDER — MAGNESIUM OXIDE 400 (241.3 MG) MG PO TABS: 400.0000 mg | ORAL_TABLET | Freq: Every day | ORAL | 1 refills | Status: DC

## 2017-05-16 MED ORDER — SODIUM CHLORIDE 0.9% FLUSH
10.0000 mL | INTRAVENOUS | Status: DC | PRN
  Administered 2017-05-16: 10 mL
  Filled 2017-05-16: qty 10

## 2017-05-16 MED ORDER — PROCHLORPERAZINE MALEATE 10 MG PO TABS
ORAL_TABLET | ORAL | Status: AC
  Filled 2017-05-16: qty 1

## 2017-05-16 MED ORDER — SODIUM CHLORIDE 0.9 % IV SOLN
Freq: Once | INTRAVENOUS | Status: AC
  Administered 2017-05-16: 15:00:00 via INTRAVENOUS
  Filled 2017-05-16: qty 250

## 2017-05-16 MED ORDER — HEPARIN SOD (PORK) LOCK FLUSH 100 UNIT/ML IV SOLN
500.0000 [IU] | Freq: Once | INTRAVENOUS | Status: AC | PRN
  Administered 2017-05-16: 500 [IU]
  Filled 2017-05-16: qty 5

## 2017-05-16 MED ORDER — PROCHLORPERAZINE MALEATE 10 MG PO TABS
10.0000 mg | ORAL_TABLET | Freq: Once | ORAL | Status: AC
  Administered 2017-05-16: 10 mg via ORAL

## 2017-05-16 MED ORDER — PACLITAXEL PROTEIN-BOUND CHEMO INJECTION 100 MG
100.0000 mg/m2 | Freq: Once | INTRAVENOUS | Status: AC
  Administered 2017-05-16: 200 mg via INTRAVENOUS
  Filled 2017-05-16: qty 40

## 2017-05-16 MED ORDER — SODIUM CHLORIDE 0.9 % IV SOLN
Freq: Once | INTRAVENOUS | Status: AC
  Administered 2017-05-16: 15:00:00 via INTRAVENOUS

## 2017-05-16 NOTE — Progress Notes (Signed)
[facility]  [phone] [phone]) [phone]     ID: Abigail Yoder DOB: 12/05/1965  MR#: [id number]  YBO#:[id number]  Patient Care Team: Abigail Gravel, MD as PCP - General (Internal Medicine) Abigail Overall, MD as Consulting Physician (General Surgery) Yoder, Abigail Dad, MD as Consulting Physician (Oncology) Abigail Gibson, MD as Attending Physician (Radiation Oncology) Abigail Pi, MD as Consulting Physician (Endocrinology) Abigail Hughs, MD as Attending Physician (Urology) Abigail Cruel, MD OTHER MD:  CHIEF COMPLAINT: Estrogen receptor positive breast cancer  CURRENT TREATMENT: adjuvant chemotherapy   BREAST CANCER HISTORY: From the original intake note:  Abigail Yoder had bilateral routine screening mammography with tomography at [facility] 11/14/2016. The breast density was category B. In the left breast upper inner quadrant there was a 1.5 cm high density mass which was further evaluated with ultrasonography 11/16/2016. This confirmed a 1.2 cm lobulated mass in the left breast upper inner quadrant. The left axilla was benign.  Biopsy of the left breast mass in question 11/21/2016 showed (SAA [id number]) invasive ductal carcinoma, grade 3, estrogen and progesterone receptor positive, HER-2 nonamplified, with a signals ratio of 1.05 and the number per cell 2.15, with an MIB-1 of 90%.  Her subsequent history is as detailed below  INTERVAL HISTORY: Abigail Yoder returns today for follow-up of her estrogen receptor positive breast cancer accompanied by her mother. Denies feeling considerably better after taking her next her week off from her last treatment. She says her appetite is up her legs are stronger (she walked all over since clot without difficulty), and she has had no problems with headaches, visual changes, nausea, vomiting, dizziness, or change in bowel or bladder habits.  She is ready to proceed today to the next cycle of Abraxane.  REVIEW OF SYSTEMS: Abigail Yoder   complains of a mild cough, nonproductive, which she attributes to postnasal drip. A detailed review of systems today was otherwise stable.  PAST MEDICAL HISTORY: Past Medical History:  Diagnosis Date  . Anemia   . Diabetes mellitus   . Family history of breast cancer   . Hypertension   . Obesity     PAST SURGICAL HISTORY: Past Surgical History:  Procedure Laterality Date  . APPENDECTOMY    . BREAST LUMPECTOMY WITH RADIOACTIVE SEED AND SENTINEL LYMPH NODE BIOPSY Left 12/31/2016   Procedure: BREAST LUMPECTOMY WITH RADIOACTIVE SEED AND SENTINEL LYMPH NODE BIOPSY;  Surgeon: Abigail Overall, MD;  Location: [facility];  Service: General;  Laterality: Left;  . CERVICAL SPINE SURGERY    . CESAREAN SECTION    . KNEE SURGERY Left   . PORTACATH PLACEMENT N/A 12/31/2016   Procedure: INSERTION PORT-A-CATH WITH [place];  Surgeon: Abigail Overall, MD;  Location: [facility];  Service: General;  Laterality: N/A;  . TUBAL LIGATION      FAMILY HISTORY Family History  Problem Relation Age of Onset  . Diabetes Mother   . Arthritis Mother   . Hypertension Father   . Heart disease Father   . Hyperlipidemia Father   . Breast cancer Sister 57       came back 17 years later  . Stroke Maternal Uncle   . Stroke Maternal Grandmother   The patient's father died at the age of 2 from a brain aneurysm. The patient's mother died at age 73 following total hip replacement. The patient had 4 brothers, 5 sisters. One sister was diagnosed with breast cancer at age 68. There is no history of ovarian cancer in the family.  GYNECOLOGIC  HISTORY:  No LMP recorded. Patient is not currently having periods (Reason: Perimenopausal). Menarche age 46, first live birth age 21, the patient is Abigail Yoder P2. She is perimenopausal and her most recent period was October 2017. She never used oral contraceptives.  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 Her  husband Abigail Yoder works in a Proofreader. Daughter Abigail Yoder works for the [employer] in [place]. Son show more right works for the Glass blower/designer at [employer] in [place]. The patient has 2 grandchildren. She attends a local [employer]: Not in place   HEALTH MAINTENANCE: Social History  Substance Use Topics  . Smoking status: Former Smoker    Quit date: 11/19/1994  . Smokeless tobacco: Never Used  . Alcohol use No     Colonoscopy:Never  PAP:2015  Bone density:Never   Allergies  Allergen Reactions  . Pineapple Anaphylaxis and Hives    Current Outpatient Prescriptions  Medication Sig Dispense Refill  . amLODipine (NORVASC) 5 MG tablet Take 5 mg by mouth daily.    Marland Kitchen aspirin EC 81 MG tablet Take 1 tablet (81 mg total) by mouth daily. 100 tablet 4  . glycopyrrolate (ROBINUL) 1 MG tablet Take 1 tablet (1 mg total) by mouth 3 (three) times daily as needed (excessive oral secretions). 30 tablet 0  . insulin aspart protamine- aspart (NOVOLOG MIX 70/30) (70-30) 100 UNIT/ML injection Inject 20 Units into the skin 2 (two) times daily with a meal.    . levofloxacin (LEVAQUIN) 750 MG tablet Take 1 tablet (750 mg total) by mouth daily. 4 tablet 0  . nebivolol (BYSTOLIC) 5 MG tablet Take 1 tablet (5 mg total) by mouth daily. 30 tablet 4  . nystatin (MYCOSTATIN) 100000 UNIT/ML suspension Use as directed 5 mLs (500,000 Units total) in the mouth or throat 4 (four) times daily. 60 mL 0  . potassium chloride (KLOR-CON 10) 10 MEQ tablet Take 1 tablet (10 mEq total) by mouth daily. Take 1 tablet twice a day for the first 3 days, then take it once daily. 30 tablet 0   No current facility-administered medications for this visit.    Facility-Administered Medications Ordered in Other Visits  Medication Dose Route Frequency Provider Last Rate Last Dose  . sodium chloride 0.9 % 250 mL with magnesium sulfate 1 g infusion    Intravenous Once Yoder, Abigail Dad, MD        OBJECTIVE: Middle-aged African-American woman in no acute distress  Vitals:   05/16/17 1301  BP: (!) 168/77  Pulse: (!) 101  Resp: 18  Temp: 98.8 F (37.1 C)     Body mass index is 33.25 kg/m.    ECOG FS:1 - Symptomatic but completely ambulatory Filed Weights   05/16/17 1301  Weight: 190 lb 11.2 oz (86.5 kg)   Sclerae unicteric, EOMs intact Oropharynx clear and moist No cervical or supraclavicular adenopathy Lungs no rales or rhonchi Heart regular rate and rhythm Abd soft, nontender, positive bowel sounds MSK no focal spinal tenderness, no upper extremity lymphedema Neuro: nonfocal, well oriented, appropriate affect Breasts: Deferred   LAB RESULTS:  CMP     Component Value Date/Time   NA 138 05/16/2017 1252   K 3.5 05/16/2017 1252   CL 111 03/18/2017 0445   CO2 26 05/16/2017 1252   GLUCOSE 223 (H) 05/16/2017 1252   GLUCOSE 333 (H) 09/05/2006 1306   BUN 9.0 05/16/2017  1252   CREATININE 1.0 05/16/2017 1252   CALCIUM 9.3 05/16/2017 1252   PROT 7.8 05/16/2017 1252   ALBUMIN 2.6 (L) 05/16/2017 1252   AST 32 05/16/2017 1252   ALT 17 05/16/2017 1252   ALKPHOS 82 05/16/2017 1252   BILITOT 0.35 05/16/2017 1252   GFRNONAA >60 03/18/2017 0445   GFRAA >60 03/18/2017 0445    INo results found for: SPEP, UPEP  Lab Results  Component Value Date   WBC 5.5 05/16/2017   NEUTROABS 3.4 05/16/2017   HGB 8.6 (L) 05/16/2017   HCT 26.8 (L) 05/16/2017   MCV 95.7 05/16/2017   PLT 124 (L) 05/16/2017      Chemistry      Component Value Date/Time   NA 138 05/16/2017 1252   K 3.5 05/16/2017 1252   CL 111 03/18/2017 0445   CO2 26 05/16/2017 1252   BUN 9.0 05/16/2017 1252   CREATININE 1.0 05/16/2017 1252      Component Value Date/Time   CALCIUM 9.3 05/16/2017 1252   ALKPHOS 82 05/16/2017 1252   AST 32 05/16/2017 1252   ALT 17 05/16/2017 1252   BILITOT 0.35 05/16/2017 1252       No results found for: LABCA2  No  components found for: LABCA125  No results for input(s): INR in the last 168 hours.  Urinalysis    Component Value Date/Time   COLORURINE STRAW (A) 03/13/2017 2011   APPEARANCEUR CLEAR 03/13/2017 2011   LABSPEC 1.011 03/13/2017 2011   LABSPEC 1.010 02/07/2017 1005   PHURINE 7.0 03/13/2017 2011   GLUCOSEU NEGATIVE 03/13/2017 2011   GLUCOSEU Negative 02/07/2017 1005   HGBUR NEGATIVE 03/13/2017 2011   BILIRUBINUR NEGATIVE 03/13/2017 2011   BILIRUBINUR Negative 02/07/2017 [place] 03/13/2017 2011   PROTEINUR 100 (A) 03/13/2017 2011   UROBILINOGEN 0.2 02/07/2017 1005   NITRITE NEGATIVE 03/13/2017 2011   LEUKOCYTESUR NEGATIVE 03/13/2017 2011   LEUKOCYTESUR Negative 02/07/2017 1005     STUDIES: Electrocardiogram today shows a heart rate of 128, showing sinus tachycardia with premature atrial complexes. There are nonspecific T wave abnormalities.  ELIGIBLE FOR AVAILABLE RESEARCH PROTOCOL: No   ASSESSMENT: 51 y.o. Media woman status post left breast upper inner quadrant biopsy 11/21/2016 for a clinical T1 cN0, stage IA invasive ductal carcinoma, grade 3, estrogen and progesterone receptor positive, HER-2 not amplified, with an MIB-1 of 90%  (1) Oncotype DX Score of 60 predicts a risk of recurrence outside the breast of 34% if the patient's only systemic therapy is tamoxifen for 5 years. On a similar database this risk drops to about 12% if chemotherapy is also given  (2) genetics testing sent 11/28/2016; The Breast/GYN gene panel offered by [facility] includes sequencing and rearrangement analysis for the following 23 genes:  ATM, BARD1, BRCA1, BRCA2, BRIP1, CDH1, CHEK2, EPCAM, FANCC, MLH1, MSH2, MSH6, MUTYH, NBN, NF1, PALB2, PMS2, POLD1, PTEN, RAD51C, RAD51D, RECQL, and TP53.   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    (3) status post left breast lumpectomy on  12/31/2016: invasive ductal carcinoma, 1.8cm, grade 3, margins negative, 1 SLN negative, ER+(80%), PR-(2%), Ki-67 90%, HER-2 negative (ratio 1.05).  T1cN0  (4) chemotherapy consisting of cyclophosphamide and doxorubicin in dose dense fashion 4 started 01/17/17, completed 03/07/2017,followed by Abraxane weekly 12.  (a)  cycle 3 AC delayed because of febrile neutropenia  (b) febrile neutropenia also occurred after cycle 4 AC  (c)  Cycle 2 and 4 of Abraxane delayed because of intolerance  (5) adjuvant radiation to follow chemotherapy  (6) anti-estrogens to follow at the completion of local treatment  PLAN: [place] has recovered from the slump she was then. I think was mostly residual from her earlier chemotherapy as Abraxane generally is much better tolerated.  However if she feels equally bad after today's dose, we will stop the Abraxane and move on to radiation  She will see me next week just to make sure that she is can it be able to continue to tolerate treatment.  I alerted her again to the possibility of peripheral neuropathy and she will let us know if that begins to develop    Abigail Cruel, MD   05/16/2017 2:28 PM Medical Oncology and Hematology [facility] [place], [place] [address] Tel. [phone]    Fax. [phone]

## 2017-05-16 NOTE — Patient Instructions (Signed)
[facility] Discharge Instructions for Patients Receiving Chemotherapy  Today you received the following chemotherapy agents :  Abraxane,  Magnesium.  To help prevent nausea and vomiting after your treatment, we encourage you to take your nausea medication as prescribed.   If you develop nausea and vomiting that is not controlled by your nausea medication, call the clinic.   BELOW ARE SYMPTOMS THAT SHOULD BE REPORTED IMMEDIATELY:  *FEVER GREATER THAN 100.5 F  *CHILLS WITH OR WITHOUT FEVER  NAUSEA AND VOMITING THAT IS NOT CONTROLLED WITH YOUR NAUSEA MEDICATION  *UNUSUAL SHORTNESS OF BREATH  *UNUSUAL BRUISING OR BLEEDING  TENDERNESS IN MOUTH AND THROAT WITH OR WITHOUT PRESENCE OF ULCERS  *URINARY PROBLEMS  *BOWEL PROBLEMS  UNUSUAL RASH Items with * indicate a potential emergency and should be followed up as soon as possible.  Feel free to call the clinic you have any questions or concerns. The clinic phone number is [phone].  Please show the [facility] at check-in to the Emergency Department and triage nurse.

## 2017-05-22 NOTE — Progress Notes (Signed)
[place]  [phone] [phone]     ID: Abigail Yoder DOB: 1966-07-22  MR#: [id number]  HBZ#:[id number]  Patient Care Team: [name], MD as PCP - General (Internal Medicine) Alphonsa Overall, MD as Consulting Physician (General Surgery) Cleotha Whalin, Virgie Dad, MD as Consulting Physician (Oncology) Eppie Gibson, MD as Attending Physician (Radiation Oncology) Jacelyn Pi, MD as Consulting Physician (Endocrinology) Ardis Hughs, MD as Attending Physician (Urology) Chauncey Cruel, MD OTHER MD:  CHIEF COMPLAINT: Estrogen receptor positive breast cancer  CURRENT TREATMENT: adjuvant chemotherapy   BREAST CANCER HISTORY: From the original intake note:  Abigail Yoder had bilateral routine screening mammography with tomography at [facility] 11/14/2016. The breast density was category B. In the left breast upper inner quadrant there was a 1.5 cm high density mass which was further evaluated with ultrasonography 11/16/2016. This confirmed a 1.2 cm lobulated mass in the left breast upper inner quadrant. The left axilla was benign.  Biopsy of the left breast mass in question 11/21/2016 showed (SAA 18-59) invasive ductal carcinoma, grade 3, estrogen and progesterone receptor positive, HER-2 nonamplified, with a signals ratio of 1.05 and the number per cell 2.15, with an MIB-1 of 90%.  Her subsequent history is as detailed below  INTERVAL HISTORY: Abigail Yoder returns today for follow-up and treatment of her estrogen receptor positive breast cancer accompanied by close friend, who was also a patient here. [name] has been receiving weekly Abraxane. Initially she had significant side effects from the treatments, some of which may have been "leftover" from the earlier chemotherapy. However they have been significant treatment delays because of poor counts. She is here today for day 1 cycle 4 of 12 planned doses of Abraxane.  REVIEW OF SYSTEMS: Abigail Yoder is "doing good". Her legs  are working a little bit better, although nowhere near her normal. Her appetite is still poor, she eats many little meals, and her sense of taste continues to be inverted. She feels very fatigued with new mild to moderate activity such as cleaning house. A detailed review of systems today was otherwise stable.  PAST MEDICAL HISTORY: Past Medical History:  Diagnosis Date  . Anemia   . Diabetes mellitus   . Family history of breast cancer   . Hypertension   . Obesity     PAST SURGICAL HISTORY: Past Surgical History:  Procedure Laterality Date  . APPENDECTOMY    . BREAST LUMPECTOMY WITH RADIOACTIVE SEED AND SENTINEL LYMPH NODE BIOPSY Left 12/31/2016   Procedure: BREAST LUMPECTOMY WITH RADIOACTIVE SEED AND SENTINEL LYMPH NODE BIOPSY;  Surgeon: Alphonsa Overall, MD;  Location: [facility];  Service: General;  Laterality: Left;  . CERVICAL SPINE SURGERY    . CESAREAN SECTION    . KNEE SURGERY Left   . PORTACATH PLACEMENT N/A 12/31/2016   Procedure: INSERTION PORT-A-CATH WITH [place];  Surgeon: Alphonsa Overall, MD;  Location: [facility];  Service: General;  Laterality: N/A;  . TUBAL LIGATION      FAMILY HISTORY Family History  Problem Relation Age of Onset  . Diabetes Mother   . Arthritis Mother   . Hypertension Father   . Heart disease Father   . Hyperlipidemia Father   . Breast cancer Sister 72       came back 17 years later  . Stroke Maternal Uncle   . Stroke Maternal Grandmother   The patient's father died at the age of 23 from a brain aneurysm. The patient's mother died at age 35 following total hip  replacement. The patient had 4 brothers, 5 sisters. One sister was diagnosed with breast cancer at age 48. There is no history of ovarian cancer in the family.  GYNECOLOGIC HISTORY:  No LMP recorded. Patient is not currently having periods (Reason: Perimenopausal). Menarche age 51, first live birth age 51, the patient is [place] P2. (Reason: Perimenopausal). Menarche age 51, first live birth age 51, the patient is [place] P2. She is perimenopausal and her most  recent period was October 2017. She never used oral contraceptives.  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 Her husband Abigail Yoder works in a Proofreader. Daughter Abigail Yoder works for the [employer] in [place]. Son Abigail Yoder works for the Glass blower/designer at [employer] in [place]. The patient has 2 grandchildren. She attends a local [employer]: Not in place   HEALTH MAINTENANCE: Social History  Substance Use Topics  . Smoking status: Former Smoker    Quit date: 11/19/1994  . Smokeless tobacco: Never Used  . Alcohol use No     Colonoscopy:Never  PAP:2015  Bone density:Never   Allergies  Allergen Reactions  . Pineapple Anaphylaxis and Hives    Current Outpatient Prescriptions  Medication Sig Dispense Refill  . amLODipine (NORVASC) 5 MG tablet Take 5 mg by mouth daily.    Marland Kitchen aspirin EC 81 MG tablet Take 1 tablet (81 mg total) by mouth daily. 100 tablet 4  . glycopyrrolate (ROBINUL) 1 MG tablet Take 1 tablet (1 mg total) by mouth 3 (three) times daily as needed (excessive oral secretions). 30 tablet 0  . insulin aspart protamine- aspart (NOVOLOG MIX 70/30) (70-30) 100 UNIT/ML injection Inject 20 Units into the skin 2 (two) times daily with a meal.    . magnesium oxide (MAG-OX) 400 (241.3 Mg) MG tablet Take 1 tablet (400 mg total) by mouth daily. 30 tablet 1  . nebivolol (BYSTOLIC) 5 MG tablet Take 1 tablet (5 mg total) by mouth daily. 30 tablet 4  . tamoxifen (NOLVADEX) 20 MG tablet Take 1 tablet (20 mg total) by mouth daily. 90 tablet 12   No current facility-administered medications for this visit.     OBJECTIVE: Middle-aged African-American woman Who appears stated age  17:   05/23/17 1401  BP: (!) 152/78  Pulse: (!) 101  Resp: 20  Temp: 98.1 F (36.7 C)     Body mass index is 33.29 kg/m.    ECOG FS:2 - Symptomatic,  <50% confined to bed Filed Weights   05/23/17 1401  Weight: 190 lb 14.4 oz (86.6 kg)   Sclerae unicteric, pupils round and equal Oropharynx clear and moist No cervical or supraclavicular adenopathy Lungs no rales or rhonchi Heart regular rate and rhythm Abd soft, nontender, positive bowel sounds MSK no focal spinal tenderness, no upper extremity lymphedema Neuro: nonfocal, well oriented, appropriate affect Breasts: Deferred   LAB RESULTS:  CMP     Component Value Date/Time   NA 137 05/23/2017 1341   K 3.8 05/23/2017 1341   CL 111 03/18/2017 0445   CO2 28 05/23/2017 1341   GLUCOSE 296 (H) 05/23/2017 1341   GLUCOSE 333 (H) 09/05/2006 1306   BUN 13.4 05/23/2017 1341   CREATININE 1.1 05/23/2017 1341   CALCIUM 9.5 05/23/2017 1341   PROT 7.9 05/23/2017 1341   ALBUMIN 2.7 (L) 05/23/2017 1341   AST 36 (H) 05/23/2017 1341   ALT 24 05/23/2017 1341   ALKPHOS 89 05/23/2017 1341   BILITOT 0.26 05/23/2017 1341  GFRNONAA >60 03/18/2017 0445   GFRAA >60 03/18/2017 0445    INo results found for: SPEP, UPEP  Lab Results  Component Value Date   WBC 2.3 (L) 05/23/2017   NEUTROABS 1.1 (L) 05/23/2017   HGB 7.8 (L) 05/23/2017   HCT 24.2 (L) 05/23/2017   MCV 94.5 05/23/2017   PLT 89 (L) 05/23/2017      Chemistry      Component Value Date/Time   NA 137 05/23/2017 1341   K 3.8 05/23/2017 1341   CL 111 03/18/2017 0445   CO2 28 05/23/2017 1341   BUN 13.4 05/23/2017 1341   CREATININE 1.1 05/23/2017 1341      Component Value Date/Time   CALCIUM 9.5 05/23/2017 1341   ALKPHOS 89 05/23/2017 1341   AST 36 (H) 05/23/2017 1341   ALT 24 05/23/2017 1341   BILITOT 0.26 05/23/2017 1341       No results found for: LABCA2  No components found for: [id number]  No results for input(s): INR in the last 168 hours.  Urinalysis    Component Value Date/Time   COLORURINE STRAW (A) 03/13/2017 2011   APPEARANCEUR CLEAR 03/13/2017 2011   LABSPEC 1.011 03/13/2017 2011   LABSPEC 1.010  02/07/2017 1005   PHURINE 7.0 03/13/2017 2011   GLUCOSEU NEGATIVE 03/13/2017 2011   GLUCOSEU Negative 02/07/2017 1005   HGBUR NEGATIVE 03/13/2017 2011   BILIRUBINUR NEGATIVE 03/13/2017 2011   BILIRUBINUR Negative 02/07/2017 [place] 03/13/2017 2011   PROTEINUR 100 (A) 03/13/2017 2011   UROBILINOGEN 0.2 02/07/2017 1005   NITRITE NEGATIVE 03/13/2017 2011   LEUKOCYTESUR NEGATIVE 03/13/2017 2011   LEUKOCYTESUR Negative 02/07/2017 1005     STUDIES: No results found.   ELIGIBLE FOR AVAILABLE RESEARCH PROTOCOL: No   ASSESSMENT: 51 y.o. [place] woman status post left breast upper inner quadrant biopsy 11/21/2016 for a clinical T1c N0, stage IA invasive ductal carcinoma, grade 3, estrogen and progesterone receptor positive, HER-2 not amplified, with an MIB-1 of 90%  (1) Oncotype DX Score of 60 predicts a risk of recurrence outside the breast of 34% if the patient's only systemic therapy is tamoxifen for 5 years. On a similar database this risk drops to about 12% if chemotherapy is also given  (2) genetics testing sent 11/28/2016; The Breast/GYN gene panel offered by [facility] includes sequencing and rearrangement analysis for the following 23 genes:  ATM, BARD1, BRCA1, BRCA2, BRIP1, CDH1, CHEK2, EPCAM, FANCC, MLH1, MSH2, MSH6, MUTYH, NBN, NF1, PALB2, PMS2, POLD1, PTEN, RAD51C, RAD51D, RECQL, and TP53.   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    (3) status post left breast lumpectomy on 12/31/2016: invasive ductal carcinoma, 1.8cm, grade 3, margins negative, 1 SLN negative, ER+(80%), PR-(2%), Ki-67 90%, HER-2 negative (ratio 1.05).  T1cN0  (4) chemotherapy consisting of cyclophosphamide and doxorubicin in dose dense fashion 4 started 01/17/17, completed 03/07/2017,followed by Abraxane weekly 12.  (a)  cycle 3 AC delayed because of febrile neutropenia  (b) febrile  neutropenia also occurred after cycle 4 AC  (c) Cycle 2 and 4 of Abraxane delayed because of intolerance  (5) adjuvant radiation to follow chemotherapy  (6) anti-estrogens to follow at the completion of local treatment  PLAN: Ragna Is feeling considerably better and felt ready to proceed to chemotherapy today.  However I am concerned by her very poor bone marrow response. She continues to become more anemic, and her white cell count  is already very low. I am concerned that if we continue to push chemotherapy she may develop irreversible bone marrow damage.  I also do not believe she will get much for benefit in risk reduction in terms from additional chemotherapy especially if we have to given every 2 weeks or so because of her poor counts.  In short I think the better plan at this point is to stop chemotherapy and move to radiation. She will get most of her benefit in terms of risk reduction from tamoxifen, and she may resume that now. I have requested an appointment with radiation oncology so within 2 weeks or so she can start her treatments.  Given her significant debilitation from therapy and the upcoming radiation, I do not believe she will be able to get back to work until November.  She knows to call for any other problems that may develop before her next visit here.  Chauncey Cruel, MD   05/23/2017 6:47 PM Medical Oncology and Hematology [facility] [address] [place], [place] [address] Tel. [phone]    Fax. [phone]

## 2017-05-23 ENCOUNTER — Other Ambulatory Visit (HOSPITAL_BASED_OUTPATIENT_CLINIC_OR_DEPARTMENT_OTHER): Payer: BLUE CROSS/BLUE SHIELD

## 2017-05-23 ENCOUNTER — Encounter: Payer: Self-pay | Admitting: Radiation Oncology

## 2017-05-23 ENCOUNTER — Encounter: Payer: Self-pay | Admitting: *Deleted

## 2017-05-23 ENCOUNTER — Ambulatory Visit: Payer: BLUE CROSS/BLUE SHIELD

## 2017-05-23 ENCOUNTER — Ambulatory Visit (HOSPITAL_BASED_OUTPATIENT_CLINIC_OR_DEPARTMENT_OTHER): Payer: BLUE CROSS/BLUE SHIELD | Admitting: Oncology

## 2017-05-23 VITALS — BP 152/78 | HR 101 | Temp 98.1°F | Resp 20 | Ht 63.5 in | Wt 190.9 lb

## 2017-05-23 DIAGNOSIS — Z170 Estrogen receptor positive status [ER+]: Secondary | ICD-10-CM | POA: Diagnosis not present

## 2017-05-23 DIAGNOSIS — D72819 Decreased white blood cell count, unspecified: Secondary | ICD-10-CM

## 2017-05-23 DIAGNOSIS — C50212 Malignant neoplasm of upper-inner quadrant of left female breast: Secondary | ICD-10-CM | POA: Diagnosis not present

## 2017-05-23 DIAGNOSIS — D649 Anemia, unspecified: Secondary | ICD-10-CM

## 2017-05-23 LAB — CBC WITH DIFFERENTIAL/PLATELET
BASO%: 0.4 % (ref 0.0–2.0)
Basophils Absolute: 0 10*3/uL (ref 0.0–0.1)
EOS%: 4 % (ref 0.0–7.0)
Eosinophils Absolute: 0.1 10*3/uL (ref 0.0–0.5)
HEMATOCRIT: 24.2 % — AB (ref 34.8–46.6)
HEMOGLOBIN: 7.8 g/dL — AB (ref 11.6–15.9)
LYMPH#: 1 10*3/uL (ref 0.9–3.3)
LYMPH%: 44.4 % (ref 14.0–49.7)
MCH: 30.5 pg (ref 25.1–34.0)
MCHC: 32.2 g/dL (ref 31.5–36.0)
MCV: 94.5 fL (ref 79.5–101.0)
MONO#: 0.1 10*3/uL (ref 0.1–0.9)
MONO%: 4 % (ref 0.0–14.0)
NEUT%: 47.2 % (ref 38.4–76.8)
NEUTROS ABS: 1.1 10*3/uL — AB (ref 1.5–6.5)
PLATELETS: 89 10*3/uL — AB (ref 145–400)
RBC: 2.56 10*6/uL — ABNORMAL LOW (ref 3.70–5.45)
RDW: 17.9 % — ABNORMAL HIGH (ref 11.2–14.5)
WBC: 2.3 10*3/uL — AB (ref 3.9–10.3)

## 2017-05-23 LAB — MAGNESIUM: Magnesium: 1.3 mg/dl — CL (ref 1.5–2.5)

## 2017-05-23 LAB — COMPREHENSIVE METABOLIC PANEL
ALT: 24 U/L (ref 0–55)
AST: 36 U/L — AB (ref 5–34)
Albumin: 2.7 g/dL — ABNORMAL LOW (ref 3.5–5.0)
Alkaline Phosphatase: 89 U/L (ref 40–150)
Anion Gap: 8 mEq/L (ref 3–11)
BUN: 13.4 mg/dL (ref 7.0–26.0)
CHLORIDE: 101 meq/L (ref 98–109)
CO2: 28 mEq/L (ref 22–29)
CREATININE: 1.1 mg/dL (ref 0.6–1.1)
Calcium: 9.5 mg/dL (ref 8.4–10.4)
EGFR: 68 mL/min/{1.73_m2} — ABNORMAL LOW (ref >90)
GLUCOSE: 296 mg/dL — AB (ref 70–140)
POTASSIUM: 3.8 meq/L (ref 3.5–5.1)
SODIUM: 137 meq/L (ref 136–145)
Total Bilirubin: 0.26 mg/dL (ref 0.20–1.20)
Total Protein: 7.9 g/dL (ref 6.4–8.3)

## 2017-05-23 MED ORDER — NEBIVOLOL HCL 5 MG PO TABS: 5.0000 mg | ORAL_TABLET | Freq: Every day | ORAL | 4 refills | Status: DC

## 2017-05-23 MED ORDER — TAMOXIFEN CITRATE 20 MG PO TABS: 20.0000 mg | ORAL_TABLET | Freq: Every day | ORAL | 12 refills | Status: DC

## 2017-05-24 NOTE — Progress Notes (Signed)
Location of Breast Cancer: Left Breast  Histology per Pathology Report:  11/21/16 Diagnosis Breast, left, needle core biopsy, 10:00 o'clock - INVASIVE DUCTAL CARCINOMA, GRADE 3. - HIGH GRADE DUCTAL CARCINOMA IN SITU. - SEE MICROSCOPIC DESCRIPTION.  Receptor Status: ER(80%), PR (2%), Her2-neu (NEG), Ki-(90%)  12/31/16 Diagnosis 1. Breast, lumpectomy, Left - INVASIVE DUCTAL CARCINOMA, 1.8 CM. - MARGINS NOT INVOLVED. - INVASIVE CARCINOMA FOCALLY 0.3 CM FROM LATERAL MARGIN. - PREVIOUS BIOPSY SITE. 2. Breast, excision, New medial margin - BENIGN BREAST TISSUE. - NO EVIDENCE OF MALIGNANCY. - FINAL MEDIAL MARGIN FREE OF TUMOR. 3. Lymph node, sentinel, biopsy, Left - ONE BENIGN LYMPH NODE (0/1).  Did patient present with symptoms or was this found on screening mammography?: It was found on a screening mammogram.   Past/Anticipated interventions by surgeon, if any: 12/31/16 PROCEDURE:   Procedure(s):  Left BREAST LUMPECTOMY WITH RADIOACTIVE SEED localization AND left axillary SENTINEL LYMPH NODE BIOPSY, deep sentinel lymph node biopsy, INSERTION right subclavian power port  SURGEON:   Alphonsa Overall, M.D.  Past/Anticipated interventions by medical oncology, if any:  Dr. Jana Hakim 05/23/17  --Chemotherapy consisting of cyclophosphamide and doxorubicin in dose dense fashion 4 started 01/17/17, completed 03/07/2017,followed by Abraxane weekly 12.             (a)  cycle 3 AC delayed because of febrile neutropenia             (b) febrile neutropenia also occurred after cycle 4 AC             (c) Cycle 2 and 4 of Abraxane delayed because of intolerance Per note 05/23/17 all further chemotherapy to be stopped.   -- Adjuvant radiation to follow chemotherapy   --Anti-estrogens to follow at the completion of local treatment (She started taking tamoxifen 2 days ago)  Lymphedema issues, if any: She denies. She has good arm mobility.   Pain issues, if any:  She denies.   SAFETY ISSUES:  Prior  radiation? No  Pacemaker/ICD? No  Possible current pregnancy? No, hx of tubal ligation.   Is the patient on methotrexate? No  Current Complaints / other details:   BP (!) 156/72   Pulse 85   Temp 98.2 F (36.8 C)   Ht 5' 3.5" (1.613 m)   Wt 192 lb 9.6 oz (87.4 kg)   SpO2 100% Comment: room air  BMI 33.58 kg/m    Wt Readings from Last 3 Encounters:  05/27/17 192 lb 9.6 oz (87.4 kg)  05/23/17 190 lb 14.4 oz (86.6 kg)  05/16/17 190 lb 11.2 oz (86.5 kg)      Marca Gadsby, Stephani Police, RN 05/24/2017,8:59 AM

## 2017-05-27 ENCOUNTER — Ambulatory Visit
Admission: RE | Admit: 2017-05-27 | Discharge: 2017-05-27 | Disposition: A | Discharge status: Home / Self Care | Payer: BLUE CROSS/BLUE SHIELD | Source: Ambulatory Visit | Attending: Radiation Oncology | Admitting: Radiation Oncology

## 2017-05-27 ENCOUNTER — Encounter: Payer: Self-pay | Admitting: Radiation Oncology

## 2017-05-27 VITALS — BP 156/72 | HR 85 | Temp 98.2°F | Ht 63.5 in | Wt 192.6 lb

## 2017-05-27 DIAGNOSIS — Z7982 Long term (current) use of aspirin: Secondary | ICD-10-CM | POA: Insufficient documentation

## 2017-05-27 DIAGNOSIS — Z170 Estrogen receptor positive status [ER+]: Secondary | ICD-10-CM | POA: Diagnosis not present

## 2017-05-27 DIAGNOSIS — Z79810 Long term (current) use of selective estrogen receptor modulators (SERMs): Secondary | ICD-10-CM | POA: Insufficient documentation

## 2017-05-27 DIAGNOSIS — Z510 Encounter for antineoplastic radiation therapy: Secondary | ICD-10-CM | POA: Diagnosis present

## 2017-05-27 DIAGNOSIS — Z9221 Personal history of antineoplastic chemotherapy: Secondary | ICD-10-CM | POA: Diagnosis not present

## 2017-05-27 DIAGNOSIS — C50212 Malignant neoplasm of upper-inner quadrant of left female breast: Secondary | ICD-10-CM | POA: Insufficient documentation

## 2017-05-27 DIAGNOSIS — Z794 Long term (current) use of insulin: Secondary | ICD-10-CM | POA: Diagnosis not present

## 2017-05-27 DIAGNOSIS — Z79899 Other long term (current) drug therapy: Secondary | ICD-10-CM | POA: Insufficient documentation

## 2017-05-27 NOTE — Progress Notes (Signed)
Radiation Oncology         [phone]) [phone] ________________________________  Name: Abigail Yoder MRN: [id number]  Date: 05/27/2017  DOB: September 30, 1966  Follow-Up Visit Note  Outpatient  CC: Abigail Gravel, MD  Abigail Yoder, Abigail Dad, MD  Diagnosis:      ICD-10-CM   1. Malignant neoplasm of upper-inner quadrant of left breast in female, estrogen receptor positive [facility]) C50.212    Z17.0     Cancer Staging Malignant neoplasm of upper-inner quadrant of left breast in female, estrogen receptor positive ([place]) Staging form: Breast, AJCC 8th Edition - Clinical stage from 11/28/2016: cT1c, cN0, cM0, ER: Positive, PR: Positive, HER2: Negative - Unsigned - Pathologic: pT1c, pN0, cM0 - Unsigned Stage I left breast cancer, ER 80%, PR 2%, Her 2 neg  CHIEF COMPLAINT: Here to discuss management of left breast cancer  Narrative:  The patient returns today for follow-up.     Since consultation, she underwent left lumpectomy and SLN bx on 12-31-16 revealing a 1.8cm tumor, negative margins by 10m, negative node. She then proceeded with chemotherapy under the care of Dr. MJana Hakim(oncotype score of 60).  She received dose dense cyclophosphamide and doxorubicin followed by Abraxane. Cycles were adjusted due to suboptimal tolerance. Most recent infusion was 05-16-17 and then systemic therapy was stopped prematurely.  She reports good arm mobility and gradual recovery from her chemotherapy effects.  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            ALLERGIES:  is allergic to pineapple.  Meds: Current Outpatient Prescriptions  Medication Sig Dispense Refill  . amLODipine (NORVASC) 5 MG tablet Take 5 mg by mouth daily.    .Marland Kitchenaspirin EC 81 MG tablet Take 1 tablet (81 mg total) by mouth daily. 100 tablet 4  . insulin aspart protamine- aspart (NOVOLOG MIX 70/30) (70-30) 100 UNIT/ML injection Inject 40 Units into the skin 2 (two) times daily with a meal.     . magnesium oxide (MAG-OX) 400  (241.3 Mg) MG tablet Take 1 tablet (400 mg total) by mouth daily. 30 tablet 1  . nebivolol (BYSTOLIC) 5 MG tablet Take 1 tablet (5 mg total) by mouth daily. 30 tablet 4  . tamoxifen (NOLVADEX) 20 MG tablet Take 1 tablet (20 mg total) by mouth daily. 90 tablet 12  . lidocaine-prilocaine (EMLA) cream   2   No current facility-administered medications for this encounter.     Physical Findings:  height is 5' 3.5" (1.613 m) and weight is 192 lb 9.6 oz (87.4 kg). Her temperature is 98.2 F (36.8 C). Her blood pressure is 156/72 (abnormal) and her pulse is 85. Her oxygen saturation is 100%. .     General: Alert and oriented, in no acute distress HEENT: Head is normocephalic. Extraocular movements are intact. Oropharynx is clear. Neck: Neck is supple, no palpable cervical or supraclavicular lymphadenopathy. Heart: Regular in rate and rhythm - Subtle systolic murmur. Chest: Clear to auscultation bilaterally, with no rhonchi, wheezes, or rales. Abdomen: Soft, nontender, nondistended, with no rigidity or guarding. Extremities: No cyanosis or edema in UEs. Lymphatics: see Neck Exam Musculoskeletal: symmetric strength and muscle tone throughout. Neurologic: No obvious focalities. Speech is fluent.  Psychiatric: Judgment and insight are intact. Affect is appropriate. Breast exam reveals well healed left lumpectomy and axillary scars, no concerning masses bilaterally.  Lab Findings: Lab Results  Component Value Date   WBC 2.3 (L) 05/23/2017   HGB 7.8 (L) 05/23/2017   HCT  24.2 (L) 05/23/2017   MCV 94.5 05/23/2017   PLT 89 (L) 05/23/2017     Radiographic Findings: No recent imaging  Impression/Plan: We discussed adjuvant radiotherapy today.  I recommend radiotherapy to the left breast in order to reduce risk of locoregional recurrence by 2/3.  The risks, benefits and side effects of this treatment were discussed in detail.  She understands that radiotherapy is associated with skin irritation  and fatigue in the acute setting. Late effects can include cosmetic changes and rare injury to internal organs.   She is enthusiastic about proceeding with treatment. A consent form has been signed and placed in her chart.  A total of 3 medically necessary complex treatment devices will be fabricated and supervised by me: 2 fields with MLCs for custom blocks to protect heart, and lungs;  and, a Vac-lok. MORE COMPLEX DEVICES MAY BE MADE IN DOSIMETRY FOR FIELD IN FIELD BEAMS FOR DOSE HOMOGENEITY.  I have requested : 3D Simulation which is medically necessary to give adequate dose to at risk tissues while sparing lungs and heart.  I have requested a DVH of the following structures: lungs, heart, left lumpectomy cavity.    The patient will receive 40.05 Gy in 15 fractions to the left breast with 2 fields.  This will be followed by a boost. Start RT after at least 3 weeks of recovery from systemic therapy.  I spent 25 minutes face to face with the patient and more than 50% of that time was spent in counseling and/or coordination of care. _____________________________________   Eppie Gibson, MD

## 2017-05-28 ENCOUNTER — Ambulatory Visit
Admission: RE | Admit: 2017-05-28 | Discharge: 2017-05-28 | Disposition: A | Discharge status: Home / Self Care | Payer: BLUE CROSS/BLUE SHIELD | Source: Ambulatory Visit | Attending: Radiation Oncology | Admitting: Radiation Oncology

## 2017-05-28 DIAGNOSIS — Z510 Encounter for antineoplastic radiation therapy: Secondary | ICD-10-CM | POA: Diagnosis not present

## 2017-05-28 DIAGNOSIS — C50212 Malignant neoplasm of upper-inner quadrant of left female breast: Secondary | ICD-10-CM

## 2017-05-28 DIAGNOSIS — Z170 Estrogen receptor positive status [ER+]: Principal | ICD-10-CM

## 2017-05-28 NOTE — Progress Notes (Signed)
Radiation Oncology         [phone]) [phone] ________________________________  Name: Abigail Yoder MRN: [id number]  Date: 05/28/2017  DOB: October 23, 1966  SIMULATION AND TREATMENT PLANNING NOTE   Special treatment procedure  Outpatient  DIAGNOSIS:     ICD-10-CM   1. Malignant neoplasm of upper-inner quadrant of left breast in female, estrogen receptor positive ([place]) C50.212    Z17.0     NARRATIVE:  The patient was brought to the [facility].  Identity was confirmed.  All relevant records and images related to the planned course of therapy were reviewed.  The patient freely provided informed written consent to proceed with treatment after reviewing the details related to the planned course of therapy. The consent form was witnessed and verified by the simulation staff.    Then, the patient was set-up in a stable reproducible supine position for radiation therapy with her ipsilateral arm over her head, and her upper body secured in a custom-made Vac-lok device.  CT images were obtained.  Surface markings were placed.  The CT images were loaded into the planning software.    Special treatment procedure:  Special treatment procedure was performed today due to the extra time and effort required by myself to plan and prepare this patient for deep inspiration breath hold technique.  I have determined cardiac sparing to be of benefit to this patient to prevent long term cardiac damage due to radiation of the heart.  Bellows were placed on the patient's abdomen. To facilitate cardiac sparing, the patient was coached by the radiation therapists on breath hold techniques and breathing practice was performed. Practice waveforms were obtained. The patient was then scanned while maintaining breath hold in the treatment position.  This image was then transferred over to the imaging specialist. The imaging specialist then created a fusion of the free breathing and breath hold scans using the chest  wall as the stable structure. I personally reviewed the fusion in axial, coronal and sagittal image planes.  Excellent cardiac sparing was obtained.  I felt the patient is an appropriate candidate for breath hold and the patient will be treated as such.  The image fusion was then reviewed with the patient to reinforce the necessity of reproducible breath hold.  TREATMENT PLANNING NOTE: Treatment planning then occurred.  The radiation prescription was entered and confirmed.     A total of 3 medically necessary complex treatment devices were fabricated and supervised by me: 2 fields with MLCs for custom blocks to protect heart, and lungs;  and, a Vac-lok. MORE COMPLEX DEVICES MAY BE MADE IN DOSIMETRY FOR FIELD IN FIELD BEAMS FOR DOSE HOMOGENEITY.  I have requested : 3D Simulation which is medically necessary to give adequate dose to at risk tissues while sparing lungs and heart.  I have requested a DVH of the following structures: lungs, heart, left lumpectomy cavity.    The patient will receive 40.05 Gy in 15 fractions to the left breast with 2 tangential fields.  This will be followed by a boost.  Optical Surface Tracking Plan:  Since intensity modulated radiotherapy (IMRT) and 3D conformal radiation treatment methods are predicated on accurate and precise positioning for treatment, intrafraction motion monitoring is medically necessary to ensure accurate and safe treatment delivery. The ability to quantify intrafraction motion without excessive ionizing radiation dose can only be performed with optical surface tracking. Accordingly, surface imaging offers the opportunity to obtain 3D measurements of patient position throughout IMRT and 3D treatments without excessive  radiation exposure. I am ordering optical surface tracking for this patient's upcoming course of radiotherapy.  ________________________________   Reference:  Ursula Alert, J, et al. Surface imaging-based analysis of  intrafraction motion for breast radiotherapy patients.Journal of [place], n. 6, nov. 2014. ISSN [id number].  Available at: <http://www.jacmp.org/index.php/jacmp/article/view/4957>.    -----------------------------------  Eppie Gibson, MD

## 2017-05-29 DIAGNOSIS — Z510 Encounter for antineoplastic radiation therapy: Secondary | ICD-10-CM | POA: Diagnosis not present

## 2017-05-30 ENCOUNTER — Other Ambulatory Visit: Payer: BLUE CROSS/BLUE SHIELD

## 2017-05-30 ENCOUNTER — Ambulatory Visit: Payer: BLUE CROSS/BLUE SHIELD

## 2017-06-03 ENCOUNTER — Other Ambulatory Visit (HOSPITAL_BASED_OUTPATIENT_CLINIC_OR_DEPARTMENT_OTHER): Payer: BLUE CROSS/BLUE SHIELD

## 2017-06-03 ENCOUNTER — Telehealth: Payer: Self-pay | Admitting: Oncology

## 2017-06-03 ENCOUNTER — Encounter: Payer: Self-pay | Admitting: *Deleted

## 2017-06-03 DIAGNOSIS — Z170 Estrogen receptor positive status [ER+]: Principal | ICD-10-CM

## 2017-06-03 DIAGNOSIS — C50212 Malignant neoplasm of upper-inner quadrant of left female breast: Secondary | ICD-10-CM

## 2017-06-03 LAB — CBC WITH DIFFERENTIAL/PLATELET
BASO%: 0.2 % (ref 0.0–2.0)
BASOS ABS: 0 10*3/uL (ref 0.0–0.1)
EOS%: 1.2 % (ref 0.0–7.0)
Eosinophils Absolute: 0.1 10*3/uL (ref 0.0–0.5)
HCT: 26.5 % — ABNORMAL LOW (ref 34.8–46.6)
HEMOGLOBIN: 8.2 g/dL — AB (ref 11.6–15.9)
LYMPH%: 22.1 % (ref 14.0–49.7)
MCH: 30.7 pg (ref 25.1–34.0)
MCHC: 30.9 g/dL — ABNORMAL LOW (ref 31.5–36.0)
MCV: 99.3 fL (ref 79.5–101.0)
MONO#: 0.6 10*3/uL (ref 0.1–0.9)
MONO%: 12.3 % (ref 0.0–14.0)
NEUT#: 3.1 10*3/uL (ref 1.5–6.5)
NEUT%: 64.2 % (ref 38.4–76.8)
Platelets: 166 10*3/uL (ref 145–400)
RBC: 2.67 10*6/uL — ABNORMAL LOW (ref 3.70–5.45)
RDW: 19.2 % — AB (ref 11.2–14.5)
WBC: 4.9 10*3/uL (ref 3.9–10.3)
lymph#: 1.1 10*3/uL (ref 0.9–3.3)

## 2017-06-03 LAB — COMPREHENSIVE METABOLIC PANEL
ALBUMIN: 2.7 g/dL — AB (ref 3.5–5.0)
ALK PHOS: 89 U/L (ref 40–150)
ALT: 15 U/L (ref 0–55)
AST: 21 U/L (ref 5–34)
Anion Gap: 8 mEq/L (ref 3–11)
BUN: 13 mg/dL (ref 7.0–26.0)
CHLORIDE: 106 meq/L (ref 98–109)
CO2: 26 meq/L (ref 22–29)
Calcium: 9.2 mg/dL (ref 8.4–10.4)
Creatinine: 1 mg/dL (ref 0.6–1.1)
EGFR: 74 mL/min/{1.73_m2} — ABNORMAL LOW (ref >90)
Glucose: 162 mg/dl — ABNORMAL HIGH (ref 70–140)
POTASSIUM: 3.6 meq/L (ref 3.5–5.1)
SODIUM: 140 meq/L (ref 136–145)
Total Bilirubin: 0.23 mg/dL (ref 0.20–1.20)
Total Protein: 7.4 g/dL (ref 6.4–8.3)

## 2017-06-03 LAB — MAGNESIUM: Magnesium: 1.4 mg/dl — CL (ref 1.5–2.5)

## 2017-06-03 NOTE — Telephone Encounter (Signed)
Scheduled appt per 7/16 sch message - left message for patient that flush appt was added prior to 8/9 radiation.

## 2017-06-06 ENCOUNTER — Ambulatory Visit: Payer: BLUE CROSS/BLUE SHIELD

## 2017-06-06 ENCOUNTER — Other Ambulatory Visit: Payer: BLUE CROSS/BLUE SHIELD

## 2017-06-06 ENCOUNTER — Ambulatory Visit: Payer: BLUE CROSS/BLUE SHIELD | Admitting: Oncology

## 2017-06-07 ENCOUNTER — Ambulatory Visit
Admission: RE | Admit: 2017-06-07 | Discharge: 2017-06-07 | Disposition: A | Discharge status: Home / Self Care | Payer: BLUE CROSS/BLUE SHIELD | Source: Ambulatory Visit | Attending: Radiation Oncology | Admitting: Radiation Oncology

## 2017-06-07 DIAGNOSIS — Z510 Encounter for antineoplastic radiation therapy: Secondary | ICD-10-CM | POA: Diagnosis not present

## 2017-06-10 ENCOUNTER — Ambulatory Visit
Admission: RE | Admit: 2017-06-10 | Discharge: 2017-06-10 | Disposition: A | Discharge status: Home / Self Care | Payer: BLUE CROSS/BLUE SHIELD | Source: Ambulatory Visit | Attending: Radiation Oncology | Admitting: Radiation Oncology

## 2017-06-10 DIAGNOSIS — C50212 Malignant neoplasm of upper-inner quadrant of left female breast: Secondary | ICD-10-CM

## 2017-06-10 DIAGNOSIS — Z510 Encounter for antineoplastic radiation therapy: Secondary | ICD-10-CM | POA: Diagnosis not present

## 2017-06-10 DIAGNOSIS — Z170 Estrogen receptor positive status [ER+]: Principal | ICD-10-CM

## 2017-06-10 MED ORDER — ALRA NON-METALLIC DEODORANT (RAD-ONC)
1.0000 "application " | Freq: Once | TOPICAL | Status: AC
  Administered 2017-06-10: 1 via TOPICAL

## 2017-06-10 MED ORDER — RADIAPLEXRX EX GEL
Freq: Once | CUTANEOUS | Status: AC
  Administered 2017-06-10: 10:00:00 via TOPICAL

## 2017-06-10 NOTE — Progress Notes (Signed)

## 2017-06-11 ENCOUNTER — Ambulatory Visit
Admission: RE | Admit: 2017-06-11 | Discharge: 2017-06-11 | Disposition: A | Discharge status: Home / Self Care | Payer: BLUE CROSS/BLUE SHIELD | Source: Ambulatory Visit | Attending: Radiation Oncology | Admitting: Radiation Oncology

## 2017-06-11 DIAGNOSIS — Z510 Encounter for antineoplastic radiation therapy: Secondary | ICD-10-CM | POA: Diagnosis not present

## 2017-06-12 ENCOUNTER — Ambulatory Visit
Admission: RE | Admit: 2017-06-12 | Discharge: 2017-06-12 | Disposition: A | Discharge status: Home / Self Care | Payer: BLUE CROSS/BLUE SHIELD | Source: Ambulatory Visit | Attending: Radiation Oncology | Admitting: Radiation Oncology

## 2017-06-12 DIAGNOSIS — Z510 Encounter for antineoplastic radiation therapy: Secondary | ICD-10-CM | POA: Diagnosis not present

## 2017-06-13 ENCOUNTER — Ambulatory Visit
Admission: RE | Admit: 2017-06-13 | Discharge: 2017-06-13 | Disposition: A | Discharge status: Home / Self Care | Payer: BLUE CROSS/BLUE SHIELD | Source: Ambulatory Visit | Attending: Radiation Oncology | Admitting: Radiation Oncology

## 2017-06-13 DIAGNOSIS — Z510 Encounter for antineoplastic radiation therapy: Secondary | ICD-10-CM | POA: Diagnosis not present

## 2017-06-14 ENCOUNTER — Ambulatory Visit
Admission: RE | Admit: 2017-06-14 | Discharge: 2017-06-14 | Disposition: A | Discharge status: Home / Self Care | Payer: BLUE CROSS/BLUE SHIELD | Source: Ambulatory Visit | Attending: Radiation Oncology | Admitting: Radiation Oncology

## 2017-06-14 DIAGNOSIS — Z510 Encounter for antineoplastic radiation therapy: Secondary | ICD-10-CM | POA: Diagnosis not present

## 2017-06-17 ENCOUNTER — Ambulatory Visit
Admission: RE | Admit: 2017-06-17 | Discharge: 2017-06-17 | Disposition: A | Discharge status: Home / Self Care | Payer: BLUE CROSS/BLUE SHIELD | Source: Ambulatory Visit | Attending: Radiation Oncology | Admitting: Radiation Oncology

## 2017-06-17 DIAGNOSIS — Z510 Encounter for antineoplastic radiation therapy: Secondary | ICD-10-CM | POA: Diagnosis not present

## 2017-06-18 ENCOUNTER — Ambulatory Visit
Admission: RE | Admit: 2017-06-18 | Discharge: 2017-06-18 | Disposition: A | Discharge status: Home / Self Care | Payer: BLUE CROSS/BLUE SHIELD | Source: Ambulatory Visit | Attending: Radiation Oncology | Admitting: Radiation Oncology

## 2017-06-18 DIAGNOSIS — Z510 Encounter for antineoplastic radiation therapy: Secondary | ICD-10-CM | POA: Diagnosis not present

## 2017-06-19 ENCOUNTER — Ambulatory Visit
Admission: RE | Admit: 2017-06-19 | Discharge: 2017-06-19 | Disposition: A | Discharge status: Home / Self Care | Payer: BLUE CROSS/BLUE SHIELD | Source: Ambulatory Visit | Attending: Radiation Oncology | Admitting: Radiation Oncology

## 2017-06-19 DIAGNOSIS — Z510 Encounter for antineoplastic radiation therapy: Secondary | ICD-10-CM | POA: Diagnosis not present

## 2017-06-20 ENCOUNTER — Ambulatory Visit
Admission: RE | Admit: 2017-06-20 | Discharge: 2017-06-20 | Disposition: A | Discharge status: Home / Self Care | Payer: BLUE CROSS/BLUE SHIELD | Source: Ambulatory Visit | Attending: Radiation Oncology | Admitting: Radiation Oncology

## 2017-06-20 DIAGNOSIS — Z510 Encounter for antineoplastic radiation therapy: Secondary | ICD-10-CM | POA: Diagnosis not present

## 2017-06-21 ENCOUNTER — Ambulatory Visit
Admission: RE | Admit: 2017-06-21 | Discharge: 2017-06-21 | Disposition: A | Discharge status: Home / Self Care | Payer: BLUE CROSS/BLUE SHIELD | Source: Ambulatory Visit | Attending: Radiation Oncology | Admitting: Radiation Oncology

## 2017-06-21 DIAGNOSIS — Z510 Encounter for antineoplastic radiation therapy: Secondary | ICD-10-CM | POA: Diagnosis not present

## 2017-06-24 ENCOUNTER — Ambulatory Visit
Admission: RE | Admit: 2017-06-24 | Discharge: 2017-06-24 | Disposition: A | Discharge status: Home / Self Care | Payer: BLUE CROSS/BLUE SHIELD | Source: Ambulatory Visit | Attending: Radiation Oncology | Admitting: Radiation Oncology

## 2017-06-24 DIAGNOSIS — Z510 Encounter for antineoplastic radiation therapy: Secondary | ICD-10-CM | POA: Diagnosis not present

## 2017-06-25 ENCOUNTER — Ambulatory Visit
Admission: RE | Admit: 2017-06-25 | Discharge: 2017-06-25 | Disposition: A | Discharge status: Home / Self Care | Payer: BLUE CROSS/BLUE SHIELD | Source: Ambulatory Visit | Attending: Radiation Oncology | Admitting: Radiation Oncology

## 2017-06-25 DIAGNOSIS — Z510 Encounter for antineoplastic radiation therapy: Secondary | ICD-10-CM | POA: Diagnosis not present

## 2017-06-26 ENCOUNTER — Ambulatory Visit
Admission: RE | Admit: 2017-06-26 | Discharge: 2017-06-26 | Disposition: A | Discharge status: Home / Self Care | Payer: BLUE CROSS/BLUE SHIELD | Source: Ambulatory Visit | Attending: Radiation Oncology | Admitting: Radiation Oncology

## 2017-06-26 DIAGNOSIS — Z510 Encounter for antineoplastic radiation therapy: Secondary | ICD-10-CM | POA: Diagnosis not present

## 2017-06-27 ENCOUNTER — Ambulatory Visit (HOSPITAL_BASED_OUTPATIENT_CLINIC_OR_DEPARTMENT_OTHER): Payer: BLUE CROSS/BLUE SHIELD

## 2017-06-27 ENCOUNTER — Ambulatory Visit
Admission: RE | Admit: 2017-06-27 | Discharge: 2017-06-27 | Disposition: A | Discharge status: Home / Self Care | Payer: BLUE CROSS/BLUE SHIELD | Source: Ambulatory Visit | Attending: Radiation Oncology | Admitting: Radiation Oncology

## 2017-06-27 VITALS — BP 180/82 | HR 64 | Temp 98.1°F | Resp 18

## 2017-06-27 DIAGNOSIS — Z452 Encounter for adjustment and management of vascular access device: Secondary | ICD-10-CM | POA: Diagnosis not present

## 2017-06-27 DIAGNOSIS — C50212 Malignant neoplasm of upper-inner quadrant of left female breast: Secondary | ICD-10-CM

## 2017-06-27 DIAGNOSIS — Z510 Encounter for antineoplastic radiation therapy: Secondary | ICD-10-CM | POA: Diagnosis not present

## 2017-06-27 DIAGNOSIS — C50919 Malignant neoplasm of unspecified site of unspecified female breast: Secondary | ICD-10-CM

## 2017-06-27 MED ORDER — SODIUM CHLORIDE 0.9% FLUSH
10.0000 mL | INTRAVENOUS | Status: DC | PRN
Start: 2017-06-27 — End: 2017-06-27
  Administered 2017-06-27: 10 mL via INTRAVENOUS
  Filled 2017-06-27: qty 10

## 2017-06-27 MED ORDER — HEPARIN SOD (PORK) LOCK FLUSH 100 UNIT/ML IV SOLN
500.0000 [IU] | Freq: Once | INTRAVENOUS | Status: AC
  Administered 2017-06-27: 500 [IU] via INTRAVENOUS
  Filled 2017-06-27: qty 5

## 2017-06-27 NOTE — Patient Instructions (Signed)
Implanted Port Home Guide An implanted port is a type of central line that is placed under the skin. Central lines are used to provide IV access when treatment or nutrition needs to be given through a person's veins. Implanted ports are used for long-term IV access. An implanted port may be placed because:  You need IV medicine that would be irritating to the small veins in your hands or arms.  You need long-term IV medicines, such as antibiotics.  You need IV nutrition for a long period.  You need frequent blood draws for lab tests.  You need dialysis.  Implanted ports are usually placed in the chest area, but they can also be placed in the upper arm, the abdomen, or the leg. An implanted port has two main parts:  Reservoir. The reservoir is round and will appear as a small, raised area under your skin. The reservoir is the part where a needle is inserted to give medicines or draw blood.  Catheter. The catheter is a thin, flexible tube that extends from the reservoir. The catheter is placed into a large vein. Medicine that is inserted into the reservoir goes into the catheter and then into the vein.  How will I care for my incision site? Do not get the incision site wet. Bathe or shower as directed by your health care provider. How is my port accessed? Special steps must be taken to access the port:  Before the port is accessed, a numbing cream can be placed on the skin. This helps numb the skin over the port site.  Your health care provider uses a sterile technique to access the port. ? Your health care provider must put on a mask and sterile gloves. ? The skin over your port is cleaned carefully with an antiseptic and allowed to dry. ? The port is gently pinched between sterile gloves, and a needle is inserted into the port.  Only "non-coring" port needles should be used to access the port. Once the port is accessed, a blood return should be checked. This helps ensure that the port  is in the vein and is not clogged.  If your port needs to remain accessed for a constant infusion, a clear (transparent) bandage will be placed over the needle site. The bandage and needle will need to be changed every week, or as directed by your health care provider.  Keep the bandage covering the needle clean and dry. Do not get it wet. Follow your health care provider's instructions on how to take a shower or bath while the port is accessed.  If your port does not need to stay accessed, no bandage is needed over the port.  What is flushing? Flushing helps keep the port from getting clogged. Follow your health care provider's instructions on how and when to flush the port. Ports are usually flushed with saline solution or a medicine called heparin. The need for flushing will depend on how the port is used.  If the port is used for intermittent medicines or blood draws, the port will need to be flushed: ? After medicines have been given. ? After blood has been drawn. ? As part of routine maintenance.  If a constant infusion is running, the port may not need to be flushed.  How long will my port stay implanted? The port can stay in for as long as your health care provider thinks it is needed. When it is time for the port to come out, surgery will be   done to remove it. The procedure is similar to the one performed when the port was put in. When should I seek immediate medical care? When you have an implanted port, you should seek immediate medical care if:  You notice a bad smell coming from the incision site.  You have swelling, redness, or drainage at the incision site.  You have more swelling or pain at the port site or the surrounding area.  You have a fever that is not controlled with medicine.  This information is not intended to replace advice given to you by your health care provider. Make sure you discuss any questions you have with your health care provider. Document  Released: 11/05/2005 Document Revised: 04/12/2016 Document Reviewed: 07/13/2013 Elsevier Interactive Patient Education  2017 [employer]vier Inc[employer]  

## 2017-06-28 ENCOUNTER — Ambulatory Visit: Admission: RE | Admit: 2017-06-28 | Payer: BLUE CROSS/BLUE SHIELD | Source: Ambulatory Visit

## 2017-07-01 ENCOUNTER — Ambulatory Visit: Payer: BLUE CROSS/BLUE SHIELD

## 2017-07-01 ENCOUNTER — Ambulatory Visit
Admission: RE | Admit: 2017-07-01 | Discharge: 2017-07-01 | Disposition: A | Discharge status: Home / Self Care | Payer: BLUE CROSS/BLUE SHIELD | Source: Ambulatory Visit | Attending: Radiation Oncology | Admitting: Radiation Oncology

## 2017-07-01 DIAGNOSIS — Z510 Encounter for antineoplastic radiation therapy: Secondary | ICD-10-CM | POA: Diagnosis not present

## 2017-07-02 ENCOUNTER — Ambulatory Visit: Payer: BLUE CROSS/BLUE SHIELD

## 2017-07-02 ENCOUNTER — Ambulatory Visit
Admission: RE | Admit: 2017-07-02 | Discharge: 2017-07-02 | Disposition: A | Discharge status: Home / Self Care | Payer: BLUE CROSS/BLUE SHIELD | Source: Ambulatory Visit | Attending: Radiation Oncology | Admitting: Radiation Oncology

## 2017-07-02 DIAGNOSIS — Z510 Encounter for antineoplastic radiation therapy: Secondary | ICD-10-CM | POA: Diagnosis not present

## 2017-07-03 ENCOUNTER — Ambulatory Visit
Admission: RE | Admit: 2017-07-03 | Discharge: 2017-07-03 | Disposition: A | Discharge status: Home / Self Care | Payer: BLUE CROSS/BLUE SHIELD | Source: Ambulatory Visit | Attending: Radiation Oncology | Admitting: Radiation Oncology

## 2017-07-03 DIAGNOSIS — Z510 Encounter for antineoplastic radiation therapy: Secondary | ICD-10-CM | POA: Diagnosis not present

## 2017-07-04 ENCOUNTER — Other Ambulatory Visit (HOSPITAL_BASED_OUTPATIENT_CLINIC_OR_DEPARTMENT_OTHER): Payer: BLUE CROSS/BLUE SHIELD

## 2017-07-04 ENCOUNTER — Ambulatory Visit
Admission: RE | Admit: 2017-07-04 | Discharge: 2017-07-04 | Disposition: A | Discharge status: Home / Self Care | Payer: BLUE CROSS/BLUE SHIELD | Source: Ambulatory Visit | Attending: Radiation Oncology | Admitting: Radiation Oncology

## 2017-07-04 DIAGNOSIS — C50212 Malignant neoplasm of upper-inner quadrant of left female breast: Secondary | ICD-10-CM

## 2017-07-04 DIAGNOSIS — Z170 Estrogen receptor positive status [ER+]: Principal | ICD-10-CM

## 2017-07-04 DIAGNOSIS — Z510 Encounter for antineoplastic radiation therapy: Secondary | ICD-10-CM | POA: Diagnosis not present

## 2017-07-04 LAB — CBC WITH DIFFERENTIAL/PLATELET
BASO%: 0 % (ref 0.0–2.0)
Basophils Absolute: 0 10e3/uL (ref 0.0–0.1)
EOS%: 2.3 % (ref 0.0–7.0)
Eosinophils Absolute: 0.1 10e3/uL (ref 0.0–0.5)
HCT: 28.9 % — ABNORMAL LOW (ref 34.8–46.6)
HGB: 9.2 g/dL — ABNORMAL LOW (ref 11.6–15.9)
LYMPH%: 21.5 % (ref 14.0–49.7)
MCH: 31.5 pg (ref 25.1–34.0)
MCHC: 31.8 g/dL (ref 31.5–36.0)
MCV: 99 fL (ref 79.5–101.0)
MONO#: 0.3 10e3/uL (ref 0.1–0.9)
MONO%: 7.2 % (ref 0.0–14.0)
NEUT#: 2.7 10e3/uL (ref 1.5–6.5)
NEUT%: 69 % (ref 38.4–76.8)
Platelets: 116 10e3/uL — ABNORMAL LOW (ref 145–400)
RBC: 2.92 10e6/uL — ABNORMAL LOW (ref 3.70–5.45)
RDW: 15 % — ABNORMAL HIGH (ref 11.2–14.5)
WBC: 3.9 10e3/uL (ref 3.9–10.3)
lymph#: 0.8 10e3/uL — ABNORMAL LOW (ref 0.9–3.3)

## 2017-07-04 LAB — COMPREHENSIVE METABOLIC PANEL WITH GFR
ALT: 10 U/L (ref 0–55)
AST: 18 U/L (ref 5–34)
Albumin: 2.6 g/dL — ABNORMAL LOW (ref 3.5–5.0)
Alkaline Phosphatase: 86 U/L (ref 40–150)
Anion Gap: 6 meq/L (ref 3–11)
BUN: 15.3 mg/dL (ref 7.0–26.0)
CO2: 26 meq/L (ref 22–29)
Calcium: 9.4 mg/dL (ref 8.4–10.4)
Chloride: 109 meq/L (ref 98–109)
Creatinine: 1 mg/dL (ref 0.6–1.1)
EGFR: 80 ml/min/1.73 m2 — ABNORMAL LOW
Glucose: 125 mg/dL (ref 70–140)
Potassium: 3.8 meq/L (ref 3.5–5.1)
Sodium: 141 meq/L (ref 136–145)
Total Bilirubin: <0.22 mg/dL (ref 0.20–1.20)
Total Protein: 7.1 g/dL (ref 6.4–8.3)

## 2017-07-04 LAB — MAGNESIUM: Magnesium: 1.5 mg/dL (ref 1.5–2.5)

## 2017-07-05 ENCOUNTER — Ambulatory Visit: Payer: BLUE CROSS/BLUE SHIELD

## 2017-07-05 ENCOUNTER — Ambulatory Visit
Admission: RE | Admit: 2017-07-05 | Discharge: 2017-07-05 | Disposition: A | Discharge status: Home / Self Care | Payer: BLUE CROSS/BLUE SHIELD | Source: Ambulatory Visit | Attending: Radiation Oncology | Admitting: Radiation Oncology

## 2017-07-05 DIAGNOSIS — Z510 Encounter for antineoplastic radiation therapy: Secondary | ICD-10-CM | POA: Diagnosis not present

## 2017-07-08 ENCOUNTER — Ambulatory Visit
Admission: RE | Admit: 2017-07-08 | Discharge: 2017-07-08 | Disposition: A | Discharge status: Home / Self Care | Payer: BLUE CROSS/BLUE SHIELD | Source: Ambulatory Visit | Attending: Radiation Oncology | Admitting: Radiation Oncology

## 2017-07-08 DIAGNOSIS — C50212 Malignant neoplasm of upper-inner quadrant of left female breast: Secondary | ICD-10-CM

## 2017-07-08 DIAGNOSIS — Z510 Encounter for antineoplastic radiation therapy: Secondary | ICD-10-CM | POA: Diagnosis not present

## 2017-07-08 DIAGNOSIS — Z170 Estrogen receptor positive status [ER+]: Principal | ICD-10-CM

## 2017-07-08 MED ORDER — RADIAPLEXRX EX GEL
Freq: Once | CUTANEOUS | Status: AC
  Administered 2017-07-08: 09:00:00 via TOPICAL

## 2017-07-09 ENCOUNTER — Encounter: Payer: Self-pay | Admitting: Radiation Oncology

## 2017-07-09 ENCOUNTER — Telehealth: Payer: Self-pay | Admitting: Oncology

## 2017-07-09 ENCOUNTER — Telehealth: Payer: Self-pay | Admitting: *Deleted

## 2017-07-09 NOTE — Progress Notes (Signed)
  Radiation Oncology         [phone] ________________________________  Name: Abigail Yoder MRN: [id number]  Date: 07/09/2017  DOB: December 21, 1965  End of Treatment Note  Diagnosis:  Stage I left breast cancer, ER 80%, PR 2%, Her 2 neg      Indication for treatment:  Curative       Radiation treatment dates:  06/10/17-07/08/17  Site/dose:   1.) Left Breast, 40.05 Gy in 15 fractions    2.) Left Breast, Boost 10 Gy in 5 fractions   Beams/energy:   1.) 3D, 6X, 10X    2.) electron, 12E  Narrative: The patient tolerated radiation treatment relatively well. Initially, she was without complaint; however, as her treatment progressed she did develop some radiation relation skin changes, including hyperpigmentation and irritation to her left breast. There was some peeling noted to the left breast as well. She was given Radiaplex which she used throughout and post-treatment. She also began using hydrocortisone and neosporin creams to remedy this as well. Otherwise, she was without complaint.   Plan: The patient has completed radiation treatment. The patient will return to radiation oncology clinic for routine followup in one month. I advised them to call or return sooner if they have any questions or concerns related to their recovery or treatment.  -----------------------------------  Eppie Gibson, MD  This document serves as a record of services personally performed by Eppie Gibson, MD. It was created on his behalf by Reola Mosher, a trained medical scribe. The creation of this record is based on the scribe's personal observations and the provider's statements to them. This document has been checked and approved by the attending provider.

## 2017-07-09 NOTE — Telephone Encounter (Signed)
Sending pt a reminder letter for their visit in October.

## 2017-07-09 NOTE — Telephone Encounter (Signed)
  Oncology Nurse Navigator Documentation  Navigator Location: [facility] (07/09/17 1100)   )Navigator Encounter Type: Telephone (07/09/17 1100) Telephone: Outgoing Call (07/09/17 1100)                 Treatment Initiated Date: 12/31/16 (07/09/17 1100) Patient Visit Type: GGEZMO (07/09/17 1100) Treatment Phase: Final Radiation Tx (07/09/17 1100) Barriers/Navigation Needs: No barriers at this time;No Questions;No Needs (07/09/17 1100)   Interventions: None required (07/09/17 1100)            Acuity: Level 1 (07/09/17 1100)         Time Spent with Patient: 15 (07/09/17 1100)

## 2017-07-30 ENCOUNTER — Ambulatory Visit: Payer: Self-pay | Admitting: Radiation Oncology

## 2017-08-05 ENCOUNTER — Encounter: Payer: Self-pay | Admitting: Radiation Oncology

## 2017-08-09 ENCOUNTER — Encounter: Payer: Self-pay | Admitting: Radiation Oncology

## 2017-08-09 ENCOUNTER — Ambulatory Visit
Admission: RE | Admit: 2017-08-09 | Discharge: 2017-08-09 | Disposition: A | Discharge status: Home / Self Care | Payer: BLUE CROSS/BLUE SHIELD | Source: Ambulatory Visit | Attending: Radiation Oncology | Admitting: Radiation Oncology

## 2017-08-09 DIAGNOSIS — Z170 Estrogen receptor positive status [ER+]: Secondary | ICD-10-CM | POA: Insufficient documentation

## 2017-08-09 DIAGNOSIS — C50212 Malignant neoplasm of upper-inner quadrant of left female breast: Secondary | ICD-10-CM | POA: Insufficient documentation

## 2017-08-09 HISTORY — DX: Personal history of irradiation: Z92.3

## 2017-08-09 NOTE — Progress Notes (Signed)
Ms. Peach presents for follow up of radiation completed 07/08/17 to her Left Breast. She denies pain or fatigue. She reports her skin has healed well and is still using radiaplex and will switch to a vitamin E cream when the tube is completed. She reports she is taking tamoxifen. She has a survivorship apt 09/11/17.  BP (!) 159/80   Pulse 91   Temp 98.3 F (36.8 C)   Wt 220 lb 12.8 oz (100.2 kg)   SpO2 98% Comment: room air  BMI 38.50 kg/m    Wt Readings from Last 3 Encounters:  08/09/17 220 lb 12.8 oz (100.2 kg)  05/27/17 192 lb 9.6 oz (87.4 kg)  05/23/17 190 lb 14.4 oz (86.6 kg)

## 2017-08-09 NOTE — Progress Notes (Signed)
Radiation Oncology         [phone] ________________________________  Name: Abigail Yoder MRN: [id number]  Date: 08/09/2017  DOB: December 14, 1965  Follow-Up Visit Note  Outpatient  CC: Abigail Gravel, MD  Abigail Yoder, Abigail Dad, MD  Diagnosis:      ICD-10-CM   1. Malignant neoplasm of upper-inner quadrant of left breast in female, estrogen receptor positive ([place]) C50.212    Z17.0    Stage IA (T1cN0M0) Left Breast UIQ Invasive Ductal Carcinoma, ER(+) / PR(+) / Her2(-), Grade 3, Pathologic Stage pT1c pN0 cM0  Previous Radiation:   Radiation treatment dates: 06/10/2017 - 07/08/2017 Site/dose:    1. Left Breast: 40.05 Gy in 15 fractions  2. Left Breast Boost: 10 Gy in 5 fractions   Narrative:  The patient returns today for routine follow-up of radiation completed 07/08/2017 to her left breast.  She is doing well. She denies any new palpable lumps or bumps in her breasts. She denies pain or fatigue. She reports her skin has healed well and is still using Radiaplex. She denies SOB, cough, or chest pain. She continues to be followed by medical oncology and is taking Tamoxifen. She has a Survivorship appointment on 09/11/2017. No additional complaints today. She says she is enjoying life after finishing treatment and has returned back to work and baking.                          ALLERGIES:  is allergic to pineapple.  Meds: Current Outpatient Prescriptions  Medication Sig Dispense Refill  . amLODipine (NORVASC) 5 MG tablet Take 5 mg by mouth daily.    . insulin aspart protamine- aspart (NOVOLOG MIX 70/30) (70-30) 100 UNIT/ML injection Inject 40 Units into the skin 2 (two) times daily with a meal.     . lidocaine-prilocaine (EMLA) cream   2  . NOVOLOG MIX 70/30 FLEXPEN (70-30) 100 UNIT/ML FlexPen   5  . tamoxifen (NOLVADEX) 20 MG tablet Take 20 mg by mouth daily.    Marland Kitchen aspirin EC 81 MG tablet Take 1 tablet (81 mg total) by mouth daily. (Patient not taking: Reported on 08/09/2017) 100 tablet 4  .  magnesium oxide (MAG-OX) 400 (241.3 Mg) MG tablet Take 1 tablet (400 mg total) by mouth daily. (Patient not taking: Reported on 08/09/2017) 30 tablet 1  . nebivolol (BYSTOLIC) 5 MG tablet Take 1 tablet (5 mg total) by mouth daily. (Patient not taking: Reported on 08/09/2017) 30 tablet 4   No current facility-administered medications for this encounter.    Review of Systems: A 10+ POINT REVIEW OF SYSTEMS WAS OBTAINED including neurology, dermatology, psychiatry, cardiac, respiratory, lymph, extremities, GI, GU, Musculoskeletal, constitutional, breasts, reproductive, HEENT.  All pertinent positives are noted in the HPI.  All others are negative.  Physical Findings:  weight is 220 lb 12.8 oz (100.2 kg). Her temperature is 98.3 F (36.8 C). Her blood pressure is 159/80 (abnormal) and her pulse is 91. Her oxygen saturation is 98%.   General: Alert and oriented, in no acute distress. Heart: Regular in rate and rhythm with no murmurs, rubs, or gallops. Chest: Clear to auscultation bilaterally, with no rhonchi, wheezes, or rales. Extremities: Mildly pitting edema in her ankles bilaterally. Psychiatric: Judgment and insight are intact. Affect is appropriate. BREASTS: No lesions palpated in breasts or axillary regions bilaterally. She still has some hyperpigmentation and hypopigmentation over her left breast. Skin is intact. Overall her skin has healed very well.   Lab Findings:  Lab Results  Component Value Date   WBC 3.9 07/04/2017   HGB 9.2 (L) 07/04/2017   HCT 28.9 (L) 07/04/2017   MCV 99.0 07/04/2017   PLT 116 (L) 07/04/2017       Radiographic Findings: No results found.  Impression/Plan: This is a very pleasant woman with a history of left breast cancer.  She knows to continue yearly mammography. I advised her to apply Vitamin E oil to the radiation site to promote further skin healing. She will continue routine follow-up with medical oncology taking Tamoxifen. I will see her back prn. I  look forward to seeing her at Survivorship celebrations in the future. I encouraged her to call if she has any issues in the interim.   She states her weight gain is due to improved appetite after recovering from chemotherapy. Denies h/o of CHF, lungs are clear.  This is close to her baseline weight.  I spent 20 minutes face to face with the patient and more than 50% of that time was spent in counseling and/or coordination of care. _____________________________________   Abigail Gibson, MD  .This document serves as a record of services personally performed by Abigail Gibson, MD. It was created on her behalf by Abigail Yoder, a trained medical scribe. The creation of this record is based on the scribe's personal observations and the provider's statements to them. This document has been checked and approved by the attending provider.

## 2017-09-04 ENCOUNTER — Telehealth: Payer: Self-pay

## 2017-09-04 NOTE — Telephone Encounter (Signed)
Left VM to remind patient of SCP visit on 10/2 at 11 am. Encouraged her to call center if has any questions about appt.

## 2017-09-11 ENCOUNTER — Other Ambulatory Visit: Payer: BLUE CROSS/BLUE SHIELD

## 2017-09-11 ENCOUNTER — Ambulatory Visit (HOSPITAL_BASED_OUTPATIENT_CLINIC_OR_DEPARTMENT_OTHER): Payer: BLUE CROSS/BLUE SHIELD | Admitting: Adult Health

## 2017-09-11 ENCOUNTER — Ambulatory Visit: Payer: BLUE CROSS/BLUE SHIELD | Admitting: Oncology

## 2017-09-11 ENCOUNTER — Encounter: Payer: Self-pay | Admitting: Adult Health

## 2017-09-11 ENCOUNTER — Telehealth: Payer: Self-pay | Admitting: Adult Health

## 2017-09-11 VITALS — BP 176/81 | HR 86 | Temp 98.2°F | Resp 18 | Ht 63.5 in | Wt 224.7 lb

## 2017-09-11 DIAGNOSIS — Z79810 Long term (current) use of selective estrogen receptor modulators (SERMs): Secondary | ICD-10-CM

## 2017-09-11 DIAGNOSIS — Z170 Estrogen receptor positive status [ER+]: Secondary | ICD-10-CM

## 2017-09-11 DIAGNOSIS — C50212 Malignant neoplasm of upper-inner quadrant of left female breast: Secondary | ICD-10-CM | POA: Diagnosis not present

## 2017-09-11 NOTE — Telephone Encounter (Signed)
Gave patient avs and calendar with appts per 10/24 los.  °

## 2017-09-11 NOTE — Progress Notes (Signed)
CLINIC:  Survivorship   REASON FOR VISIT:  Routine follow-up post-treatment for a recent history of breast cancer.  BRIEF ONCOLOGIC HISTORY:    Malignant neoplasm of upper-inner quadrant of left breast in female, estrogen receptor positive ([place])   11/27/2016 Initial Diagnosis    Malignant neoplasm of upper-inner quadrant of left breast in female, estrogen receptor positive ([place])     12/11/2016 Genetic Testing    Patient has genetic testing done for personal and family history of breast cancer. RAD51D c.919G>A VUS identified on the Breast/Gyn Panel.  Negative genetic testing for the MSH2 inversion analysis ([place] inversion). The Breast/GYN gene panel offered by [facility] includes sequencing and rearrangement analysis for the following 23 genes:  ATM, BARD1, BRCA1, BRCA2, BRIP1, CDH1, CHEK2, EPCAM, FANCC, MLH1, MSH2, MSH6, MUTYH, NBN, NF1, PALB2, PMS2, POLD1, PTEN, RAD51C, RAD51D, RECQL, and TP53.         12/31/2016 Surgery    Left breast lumpectomy Lucia Gaskins): IDC, 1.8cm, grade 3, margins negative, 1SLN negative, ER+(80%), PR-(2%), Ki-67 90%, HER-2 negative (ratio 1.05).       01/17/2017 - 05/16/2017 Adjuvant Chemotherapy    Doxorubicin and Cyclophosphamide x 4 cycles to be followed by weekly Abraxane      06/10/2017 - 07/08/2017 Radiation Therapy    Adjuvant radiation Isidore Moos): 1.) Left Breast, 2.67 Gy in 15 fractions.  2.) Left Breast, Boost 2 Gy in 5 fractions       05/2017 -  Anti-estrogen oral therapy    Tamoxifen daily       INTERVAL HISTORY:  Ms. Bettendorf presents to the [facility] today for our initial meeting to review her survivorship care plan detailing her treatment course for breast cancer, as well as monitoring long-term side effects of that treatment, education regarding health maintenance, screening, and overall wellness and health promotion.     Overall, Ms. Usrey reports feeling quite well.  She is taking the Tamoxifen daily.  She does have some occasional hot  flashes.  She says they start in her head first and she does feel quite hot.  She is ready to go back to work November 5 and is requesting that I write her a return to work note without restrictions.  She cannot get her port removed by Dr. Lucia Gaskins because she owes his office $1500, so she is requesting an alternative way.      REVIEW OF SYSTEMS:  Review of Systems  Constitutional: Negative for appetite change, chills, fatigue, fever and unexpected weight change.  HENT:   Negative for hearing loss and lump/mass.   Eyes: Negative for eye problems and icterus.  Respiratory: Negative for chest tightness, cough and shortness of breath.   Cardiovascular: Negative for chest pain, leg swelling and palpitations.  Gastrointestinal: Negative for abdominal distention, abdominal pain, constipation, diarrhea, nausea and vomiting.  Endocrine: Negative for hot flashes.  Genitourinary: Negative for difficulty urinating.   Skin: Negative for itching and rash.  Neurological: Negative for dizziness, extremity weakness, headaches and numbness.  Hematological: Negative for adenopathy. Does not bruise/bleed easily.  Psychiatric/Behavioral: Negative for depression. The patient is not nervous/anxious.    Breast: Denies any new nodularity, masses, tenderness, nipple changes, or nipple discharge.      ONCOLOGY TREATMENT TEAM:  1. Surgeon:  Dr. [facility]ins at [facility] 2. Medical Oncologist: Dr. Jana Hakim  3. Radiation Oncologist: Dr. Isidore Moos    PAST MEDICAL/SURGICAL HISTORY:  Past Medical History:  Diagnosis Date  . Anemia   . Diabetes mellitus   . Family history  of breast cancer   . History of radiation therapy 06/10/17- 07/08/17   Left Breast 2.67 Gy in 15 fractions. Left Breast boost 2 Gy in 5 fractions  . Hypertension   . Obesity    Past Surgical History:  Procedure Laterality Date  . APPENDECTOMY    . BREAST LUMPECTOMY WITH RADIOACTIVE SEED AND SENTINEL LYMPH NODE BIOPSY Left 12/31/2016    Procedure: BREAST LUMPECTOMY WITH RADIOACTIVE SEED AND SENTINEL LYMPH NODE BIOPSY;  Surgeon: Alphonsa Overall, MD;  Location: [facility];  Service: General;  Laterality: Left;  . CERVICAL SPINE SURGERY    . CESAREAN SECTION    . KNEE SURGERY Left   . PORTACATH PLACEMENT N/A 12/31/2016   Procedure: INSERTION PORT-A-CATH WITH [place];  Surgeon: Alphonsa Overall, MD;  Location: [facility];  Service: General;  Laterality: N/A;  . TUBAL LIGATION       ALLERGIES:  Allergies  Allergen Reactions  . Pineapple Anaphylaxis and Hives     CURRENT MEDICATIONS:  Outpatient Encounter Prescriptions as of 09/11/2017  Medication Sig  . amLODipine (NORVASC) 5 MG tablet Take 5 mg by mouth daily.  . insulin aspart protamine- aspart (NOVOLOG MIX 70/30) (70-30) 100 UNIT/ML injection Inject 40 Units into the skin 2 (two) times daily with a meal.   . tamoxifen (NOLVADEX) 20 MG tablet Take 20 mg by mouth daily.  . [DISCONTINUED] aspirin EC 81 MG tablet Take 1 tablet (81 mg total) by mouth daily. (Patient not taking: Reported on 08/09/2017)  . [DISCONTINUED] lidocaine-prilocaine (EMLA) cream   . [DISCONTINUED] magnesium oxide (MAG-OX) 400 (241.3 Mg) MG tablet Take 1 tablet (400 mg total) by mouth daily. (Patient not taking: Reported on 08/09/2017)  . [DISCONTINUED] nebivolol (BYSTOLIC) 5 MG tablet Take 1 tablet (5 mg total) by mouth daily. (Patient not taking: Reported on 08/09/2017)  . [DISCONTINUED] NOVOLOG MIX 70/30 FLEXPEN (70-30) 100 UNIT/ML FlexPen    No facility-administered encounter medications on file as of 09/11/2017.      ONCOLOGIC FAMILY HISTORY:  Family History  Problem Relation Age of Onset  . Diabetes Mother   . Arthritis Mother   . Hypertension Father   . Heart disease Father   . Hyperlipidemia Father   . Breast cancer Sister 27       came back 17 years later  . Stroke Maternal Uncle   . Stroke Maternal Grandmother      GENETIC COUNSELING/TESTING: See  above  SOCIAL HISTORY:  LIZZIE AN is married lives with her husband and son in [place], [place].  She has 2 children and her son lives with her, and her daughter lives in [place].  Ms. 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  She denies any current or history of tobacco, alcohol, or illicit drug use.     PHYSICAL EXAMINATION:  Vital Signs:   Vitals:   09/11/17 1108  BP: (!) 176/81  Pulse: 86  Resp: 18  Temp: 98.2 F (36.8 C)  SpO2: 100%   Filed Weights   09/11/17 1108  Weight: 224 lb 11.2 oz (101.9 kg)   General: Well-nourished, well-appearing female in no acute distress.  She is unaccompanied today.   HEENT: Head is normocephalic.  Pupils equal and reactive to light. Conjunctivae clear without exudate.  Sclerae anicteric. Oral mucosa is pink, moist.  Oropharynx is pink without lesions or erythema.  Lymph: No cervical, supraclavicular, or infraclavicular lymphadenopathy noted on palpation.  Cardiovascular: Regular rate and rhythm.Marland Kitchen Respiratory: Clear to auscultation bilaterally. Chest expansion symmetric; breathing non-labored.  Breast: right breast without nodules, masses, skin or nipple changes, left breast s/p lumpectomy, healing well, radiation thickness noted, no masses, nodularity or sign of recurrence. GI: Abdomen soft and round; non-tender, non-distended. Bowel sounds normoactive.  GU: Deferred.  Neuro: No focal deficits. Steady gait.  Psych: Mood and affect normal and appropriate for situation.  Extremities: No edema. MSK: No focal spinal tenderness to palpation.  Full range of motion in bilateral upper extremities Skin: Warm and dry.  LABORATORY DATA:  None for this visit.  DIAGNOSTIC IMAGING:  None for this visit.      ASSESSMENT AND PLAN:  Ms.. Felty is a pleasant 51 y.o. female with Stage IA left breast invasive ductal carcinoma, ER+/PR+/HER2-, diagnosed in 11/2016,  treated with lumpectomy, adjuvant chemotherapy, adjuvant radiation therapy, and anti-estrogen therapy with Tamoxifen beginning in 05/2017.  She presents to the Survivorship Clinic for our initial meeting and routine follow-up post-completion of treatment for breast cancer.    1. Stage IA left breast cancer:  Ms. Biffle is continuing to recover from definitive treatment for breast cancer. She will follow-up with her medical oncologist, Dr.  Jana Hakim in 6 months.  She will continue her anti-estrogen therapy with Tamoxifen. Thus far, she is tolerating the Tamoxifen well, with minimal side effects.  I wrote her a letter to return to work without restrictions today, and also put in an order for IR to remove her port. Today, a comprehensive survivorship care plan and treatment summary was reviewed with the patient today detailing her breast cancer diagnosis, treatment course, potential late/long-term effects of treatment, appropriate follow-up care with recommendations for the future, and patient education resources.  A copy of this summary, along with a letter will be sent to the patient's primary care provider via mail/fax/In Basket message after today's visit.    2. Bone health:  Given Ms. Youngers's history of breast cancer she is at slight risk for bone demineralization.  I counseled her that Tamoxifen would have a protective effect on her bones.  She was given education on specific activities to promote bone health.  3. Cancer screening:  Due to Ms. Hakim's history and her age, she should receive screening for skin cancers, colon cancer, and gynecologic cancers.  The information and recommendations are listed on the patient's comprehensive care plan/treatment summary and were reviewed in detail with the patient.    4. Health maintenance and wellness promotion: Ms. Butzer was encouraged to consume 5-7 servings of fruits and vegetables per day. We reviewed the "Nutrition Rainbow" handout, as well as the  handout "Take Control of Your Health and Reduce Your Cancer Risk" from the [facility].  She was also encouraged to engage in moderate to vigorous exercise for 30 minutes per day most days of the week. We discussed the [employer] fitness program, which is designed for cancer survivors to help them become more physically fit after cancer treatments.  She was instructed to limit her alcohol consumption and continue to abstain from tobacco use.   5. Support services/counseling: It is not uncommon for this period of the patient's cancer care trajectory to be one of many emotions and stressors.  We discussed an opportunity for her to participate in the next session of [facility] ("Finding Your New Normal") support group series designed for patients after they have completed treatment.   Ms. Klimas was encouraged to take advantage of our many other support  services programs, support groups, and/or counseling in coping with her new life as a cancer survivor after completing anti-cancer treatment.  She was offered support today through active listening and expressive supportive counseling.  She was given information regarding our available services and encouraged to contact me with any questions or for help enrolling in any of our support group/programs.    Dispo:   -Return to cancer center for follow up with Dr. Jana Hakim in 6 months -Mammogram due in 10/2017 -Follow up with Dr. Lucia Gaskins once patient has paid bill per surgical office -She is welcome to return back to the Survivorship Clinic at any time; no additional follow-up needed at this time.  -Consider referral back to survivorship as a long-term survivor for continued surveillance  A total of (30) minutes of face-to-face time was spent with this patient with greater than 50% of that time in counseling and care-coordination.   Gardenia Phlegm, [place] [phone]   Note: PRIMARY CARE  PROVIDER [name], [place] [phone]

## 2017-10-16 ENCOUNTER — Other Ambulatory Visit: Payer: Self-pay | Admitting: Student

## 2017-10-16 ENCOUNTER — Other Ambulatory Visit: Payer: Self-pay | Admitting: Radiology

## 2017-10-17 ENCOUNTER — Other Ambulatory Visit: Payer: Self-pay | Admitting: General Surgery

## 2017-10-17 ENCOUNTER — Other Ambulatory Visit: Payer: Self-pay | Admitting: Radiology

## 2017-10-18 ENCOUNTER — Encounter (HOSPITAL_COMMUNITY): Payer: Self-pay

## 2017-10-18 ENCOUNTER — Ambulatory Visit (HOSPITAL_COMMUNITY)
Admission: RE | Admit: 2017-10-18 | Discharge: 2017-10-18 | Disposition: A | Discharge status: Home / Self Care | Payer: BLUE CROSS/BLUE SHIELD | Source: Ambulatory Visit | Attending: Oncology | Admitting: Oncology

## 2017-10-18 ENCOUNTER — Ambulatory Visit (HOSPITAL_COMMUNITY)
Admission: RE | Admit: 2017-10-18 | Discharge: 2017-10-18 | Disposition: A | Discharge status: Home / Self Care | Payer: BLUE CROSS/BLUE SHIELD | Source: Ambulatory Visit | Attending: Adult Health | Admitting: Adult Health

## 2017-10-18 DIAGNOSIS — C50212 Malignant neoplasm of upper-inner quadrant of left female breast: Secondary | ICD-10-CM

## 2017-10-18 DIAGNOSIS — Z170 Estrogen receptor positive status [ER+]: Secondary | ICD-10-CM

## 2017-10-18 DIAGNOSIS — Z853 Personal history of malignant neoplasm of breast: Secondary | ICD-10-CM | POA: Insufficient documentation

## 2017-10-18 DIAGNOSIS — Z452 Encounter for adjustment and management of vascular access device: Secondary | ICD-10-CM | POA: Insufficient documentation

## 2017-10-18 HISTORY — PX: IR REMOVAL TUN ACCESS W/ PORT W/O FL MOD SED: IMG2290

## 2017-10-18 LAB — CBC
HCT: 31.1 % — ABNORMAL LOW (ref 36.0–46.0)
HEMOGLOBIN: 10.2 g/dL — AB (ref 12.0–15.0)
MCH: 30.7 pg (ref 26.0–34.0)
MCHC: 32.8 g/dL (ref 30.0–36.0)
MCV: 93.7 fL (ref 78.0–100.0)
Platelets: 158 10*3/uL (ref 150–400)
RBC: 3.32 MIL/uL — AB (ref 3.87–5.11)
RDW: 14.1 % (ref 11.5–15.5)
WBC: 6.1 10*3/uL (ref 4.0–10.5)

## 2017-10-18 LAB — GLUCOSE, CAPILLARY: GLUCOSE-CAPILLARY: 74 mg/dL (ref 65–99)

## 2017-10-18 LAB — PROTIME-INR
INR: 0.99
PROTHROMBIN TIME: 13 s (ref 11.4–15.2)

## 2017-10-18 LAB — APTT: aPTT: 26 seconds (ref 24–36)

## 2017-10-18 MED ORDER — CEFAZOLIN SODIUM-DEXTROSE 2-4 GM/100ML-% IV SOLN
INTRAVENOUS | Status: AC
  Filled 2017-10-18: qty 100

## 2017-10-18 MED ORDER — CEFAZOLIN SODIUM-DEXTROSE 2-4 GM/100ML-% IV SOLN
INTRAVENOUS | Status: DC
Start: 2017-10-18 — End: 2017-10-18
  Filled 2017-10-18: qty 100

## 2017-10-18 MED ORDER — MIDAZOLAM HCL 2 MG/2ML IJ SOLN
INTRAMUSCULAR | Status: AC | PRN
  Administered 2017-10-18 (×2): 1 mg via INTRAVENOUS

## 2017-10-18 MED ORDER — SODIUM CHLORIDE 0.9 % IV SOLN
INTRAVENOUS | Status: DC
  Administered 2017-10-18: 13:00:00 via INTRAVENOUS

## 2017-10-18 MED ORDER — FENTANYL CITRATE (PF) 100 MCG/2ML IJ SOLN
INTRAMUSCULAR | Status: AC | PRN
  Administered 2017-10-18 (×2): 50 ug via INTRAVENOUS

## 2017-10-18 MED ORDER — FENTANYL CITRATE (PF) 100 MCG/2ML IJ SOLN
INTRAMUSCULAR | Status: AC
  Filled 2017-10-18: qty 2

## 2017-10-18 MED ORDER — DEXTROSE 5 % IV SOLN
INTRAVENOUS | Status: DC
  Administered 2017-10-18: 13:00:00 via INTRAVENOUS

## 2017-10-18 MED ORDER — LIDOCAINE HCL 1 % IJ SOLN
INTRAMUSCULAR | Status: AC
  Filled 2017-10-18: qty 20

## 2017-10-18 MED ORDER — MIDAZOLAM HCL 2 MG/2ML IJ SOLN
INTRAMUSCULAR | Status: AC
  Filled 2017-10-18: qty 2

## 2017-10-18 MED ORDER — LIDOCAINE HCL 1 % IJ SOLN
INTRAMUSCULAR | Status: AC | PRN
  Administered 2017-10-18: 10 mL

## 2017-10-18 MED ORDER — CEFAZOLIN SODIUM-DEXTROSE 2-4 GM/100ML-% IV SOLN
2.0000 g | INTRAVENOUS | Status: AC
  Administered 2017-10-18: 2 g via INTRAVENOUS

## 2017-10-18 NOTE — Consult Note (Signed)
Chief Complaint: Patient was seen in consultation today for Port-A-Cath removal  Referring Physician(s): Causey,Lindsey Cornetto/[name],G  Supervising Physician: Aletta Edouard  Patient Status: [facility] - Out-pt  History of Present Illness: Abigail Yoder is a 51 y.o. female with history of left breast carcinoma diagnosed in January of this year, status post surgery, chemoradiation and antiestrogen therapy.  She also had a right subclavian Port-A-Cath placed by Dr. Lucia Gaskins on 12/31/16.  She has completed treatment and presents today for Port-A-Cath removal.  Past Medical History:  Diagnosis Date  . Anemia   . Diabetes mellitus   . Family history of breast cancer   . History of radiation therapy 06/10/17- 07/08/17   Left Breast 2.67 Gy in 15 fractions. Left Breast boost 2 Gy in 5 fractions  . Hypertension   . Obesity     Past Surgical History:  Procedure Laterality Date  . APPENDECTOMY    . BREAST LUMPECTOMY WITH RADIOACTIVE SEED AND SENTINEL LYMPH NODE BIOPSY Left 12/31/2016   Procedure: BREAST LUMPECTOMY WITH RADIOACTIVE SEED AND SENTINEL LYMPH NODE BIOPSY;  Surgeon: Alphonsa Overall, MD;  Location: [place];  Service: General;  Laterality: Left;  . CERVICAL SPINE SURGERY    . CESAREAN SECTION    . KNEE SURGERY Left   . PORTACATH PLACEMENT N/A 12/31/2016   Procedure: INSERTION PORT-A-CATH WITH [place];  Surgeon: Alphonsa Overall, MD;  Location: [place];  Service: General;  Laterality: N/A;  . TUBAL LIGATION      Allergies: Pineapple  Medications: Prior to Admission medications   Medication Sig Start Date End Date Taking? Authorizing Provider  amLODipine (NORVASC) 5 MG tablet Take 5 mg by mouth daily.   Yes [provider]  insulin aspart protamine- aspart (NOVOLOG MIX 70/30) (70-30) 100 UNIT/ML injection Inject 40 Units into the skin 2 (two) times daily with a meal.    Yes [provider]  tamoxifen (NOLVADEX) 20 MG tablet Take 20  mg by mouth daily.   Yes [provider]     Family History  Problem Relation Age of Onset  . Diabetes Mother   . Arthritis Mother   . Hypertension Father   . Heart disease Father   . Hyperlipidemia Father   . Breast cancer Sister 46       came back 17 years later  . Stroke Maternal Uncle   . Stroke Maternal Grandmother     Social History   Socioeconomic History  . Marital status: Married    Spouse name: None  . Number of children: 2  . Years of education: None  . Highest education level: None  Social Needs  . Financial resource strain: None  . Food insecurity - worry: None  . Food insecurity - inability: None  . Transportation needs - medical: None  . Transportation needs - non-medical: None  Occupational History  . None  Tobacco Use  . Smoking status: Former Smoker    Last attempt to quit: 11/19/1994    Years since quitting: 22.9  . Smokeless tobacco: Never Used  Substance and Sexual Activity  . Alcohol use: No  . Drug use: No  . Sexual activity: Yes    Birth control/protection: Surgical  Other Topics Concern  . None  Social History Narrative  . None      Review of Systems she denies fever, headache, chest pain, dyspnea, cough, abdominal/back pain, nausea, vomiting or bleeding.  Vital Signs: BP (!) 170/65 (BP Location: Right Arm)   Pulse  99   Temp 98.2 F (36.8 C) (Oral)   Resp 18   SpO2 100%   Physical Exam awake, alert.  Chest clear to auscultation bilaterally.  Clean, intact right chest wall Port-A-Cath.  Heart with regular rate and rhythm.  Abdomen soft, obese, positive bowel sounds, nontender.  Bilateral lower extremity edema noted.  Imaging: No results found.  Labs:  CBC: Recent Labs    05/16/17 1252 05/23/17 1341 06/03/17 1149 07/04/17 0850  WBC 5.5 2.3* 4.9 3.9  HGB 8.6* 7.8* 8.2* 9.2*  HCT 26.8* 24.2* 26.5* 28.9*  PLT 124* 89* 166 116*    COAGS: Recent Labs    03/14/17 1027  INR 1.14    BMP: Recent Labs     03/14/17 0455 03/15/17 0500 03/16/17 0322 03/17/17 0408 03/18/17 0445  05/16/17 1252 05/23/17 1341 06/03/17 1150 07/04/17 0850  NA 137  --  138 139 141   < > 138 137 140 141  K 3.9  --  3.2* 3.6 3.2*   < > 3.5 3.8 3.6 3.8  CL 105  --  106 110 111  --   --   --   --   --   CO2 26  --  23 22 23    < > 26 28 26 26   GLUCOSE 175*  --  121* 135* 107*   < > 223* 296* 162* 125  BUN 14  --  9 10 10    < > 9.0 13.4 13.0 15.3  CALCIUM 7.2*  --  6.9* 6.8* 7.0*   < > 9.3 9.5 9.2 9.4  CREATININE 1.04* 1.07* 1.11* 1.05* 0.93   < > 1.0 1.1 1.0 1.0  GFRNONAA >60 59* 56* >60 >60  --   --   --   --   --   GFRAA >60 >60 >60 >60 >60  --   --   --   --   --    < > = values in this interval not displayed.    LIVER FUNCTION TESTS: Recent Labs    05/16/17 1252 05/23/17 1341 06/03/17 1150 07/04/17 0850  BILITOT 0.35 0.26 0.23 <0.22  AST 32 36* 21 18  ALT 17 24 15 10   ALKPHOS 82 89 89 86  PROT 7.8 7.9 7.4 7.1  ALBUMIN 2.6* 2.7* 2.7* 2.6*    TUMOR MARKERS: No results for input(s): AFPTM, CEA, CA199, CHROMGRNA in the last 8760 hours.  Assessment and Plan:  50 y.o. female with history of left breast carcinoma diagnosed in January of this year, status post surgery, chemoradiation and antiestrogen therapy.  She also had a right subclavian Port-A-Cath placed by Dr. Lucia Gaskins on 12/31/16.  She has completed treatment and presents today for Port-A-Cath removal.  Details/risks of procedure, including but not limited to, internal bleeding, infection, injury to adjacent structures discussed with patient/mother with their understanding and consent. LABS PEND.   Thank you for this interesting consult.  I greatly enjoyed meeting Abigail Yoder and look forward to participating in their care.  A copy of this report was sent to the requesting provider on this date.  Electronically Signed: D. Rowe Robert, PA-C 10/18/2017, 1:07 PM   I spent a total of  20 minutes   in face to face in clinical consultation,  greater than 50% of which was counseling/coordinating care for Port-A-Cath removal

## 2017-10-18 NOTE — Sedation Documentation (Signed)
Patient is resting comfortably. 

## 2017-10-18 NOTE — Discharge Instructions (Signed)
Implanted Port Removal, Care After °Refer to this sheet in the next few weeks. These instructions provide you with information about caring for yourself after your procedure. Your health care provider may also give you more specific instructions. Your treatment has been planned according to current medical practices, but problems sometimes occur. Call your health care provider if you have any problems or questions after your procedure. °What can I expect after the procedure? °After the procedure, it is common to have: °· Soreness or pain near your incision. °· Some swelling or bruising near your incision. ° °Follow these instructions at home: °Medicines °· Take over-the-counter and prescription medicines only as told by your health care provider. °· If you were prescribed an antibiotic medicine, take it as told by your health care provider. Do not stop taking the antibiotic even if you start to feel better. °Bathing °· Do not take baths, swim, or use a hot tub until your health care provider approves. Ask your health care provider if you can take showers. You may only be allowed to take sponge baths for bathing. °Incision care °· Follow instructions from your health care provider about how to take care of your incision. Make sure you: °? Wash your hands with soap and water before you change your bandage (dressing). If soap and water are not available, use hand sanitizer. °? Change your dressing as told by your health care provider. °? Keep your dressing dry. °? Leave stitches (sutures), skin glue, or adhesive strips in place. These skin closures may need to stay in place for 2 weeks or longer. If adhesive strip edges start to loosen and curl up, you may trim the loose edges. Do not remove adhesive strips completely unless your health care provider tells you to do that. °· Check your incision area every day for signs of infection. Check for: °? More redness, swelling, or pain. °? More fluid or  blood. °? Warmth. °? Pus or a bad smell. °Driving °· If you received a sedative, do not drive for 24 hours after the procedure. °· If you did not receive a sedative, ask your health care provider when it is safe to drive. °Activity °· Return to your normal activities as told by your health care provider. Ask your health care provider what activities are safe for you. °· Until your health care provider says it is safe: °? Do not lift anything that is heavier than 10 lb (4.5 kg). °? Do not do activities that involve lifting your arms over your head. °General instructions °· Do not use any tobacco products, such as cigarettes, chewing tobacco, and e-cigarettes. Tobacco can delay healing. If you need help quitting, ask your health care provider. °· Keep all follow-up visits as told by your health care provider. This is important. °Contact a health care provider if: °· You have more redness, swelling, or pain around your incision. °· You have more fluid or blood coming from your incision. °· Your incision feels warm to the touch. °· You have pus or a bad smell coming from your incision. °· You have a fever. °· You have pain that is not relieved by your pain medicine. °Get help right away if: °· You have chest pain. °· You have difficulty breathing. °This information is not intended to replace advice given to you by your health care provider. Make sure you discuss any questions you have with your health care provider. °Document Released: 10/17/2015 Document Revised: 04/12/2016 Document Reviewed: 08/10/2015 °Elsevier Interactive Patient   Education © 2018 [employer] °Moderate Conscious Sedation, Adult, Care After °These instructions provide you with information about caring for yourself after your procedure. Your health care provider may also give you more specific instructions. Your treatment has been planned according to current medical practices, but problems sometimes occur. Call your health care provider if you have  any problems or questions after your procedure. °What can I expect after the procedure? °After your procedure, it is common: °· To feel sleepy for several hours. °· To feel clumsy and have poor balance for several hours. °· To have poor judgment for several hours. °· To vomit if you eat too soon. ° °Follow these instructions at home: °For at least 24 hours after the procedure: ° °· Do not: °? Participate in activities where you could fall or become injured. °? Drive. °? Use heavy machinery. °? Drink alcohol. °? Take sleeping pills or medicines that cause drowsiness. °? Make important decisions or sign legal documents. °? Take care of children on your own. °· Rest. °Eating and drinking °· Follow the diet recommended by your health care provider. °· If you vomit: °? Drink water, juice, or soup when you can drink without vomiting. °? Make sure you have little or no nausea before eating solid foods. °General instructions °· Have a responsible adult stay with you until you are awake and alert. °· Take over-the-counter and prescription medicines only as told by your health care provider. °· If you smoke, do not smoke without supervision. °· Keep all follow-up visits as told by your health care provider. This is important. °Contact a health care provider if: °· You keep feeling nauseous or you keep vomiting. °· You feel light-headed. °· You develop a rash. °· You have a fever. °Get help right away if: °· You have trouble breathing. °This information is not intended to replace advice given to you by your health care provider. Make sure you discuss any questions you have with your health care provider. °Document Released: 08/26/2013 Document Revised: 04/09/2016 Document Reviewed: 02/25/2016 °Elsevier Interactive Patient Education © 2018 [employer] ° °

## 2017-10-18 NOTE — Procedures (Signed)
Interventional Radiology Procedure Note  Procedure: Port removal  Complications: None  Estimated Blood Loss: 10 mL  Recommendations: Right chest porta-cath removed in entirety using sharp and blunt dissection.  Venetia Night. Kathlene Cote, M.D Pager:  [phone]

## 2017-12-14 IMAGING — CR DG CHEST 1V PORT
1 series · 1 of 1 positions shown · non-contrast
Comparison: None.

CLINICAL DATA: Port-A-Cath placement

EXAM:
PORTABLE CHEST 1 VIEW

[AP]
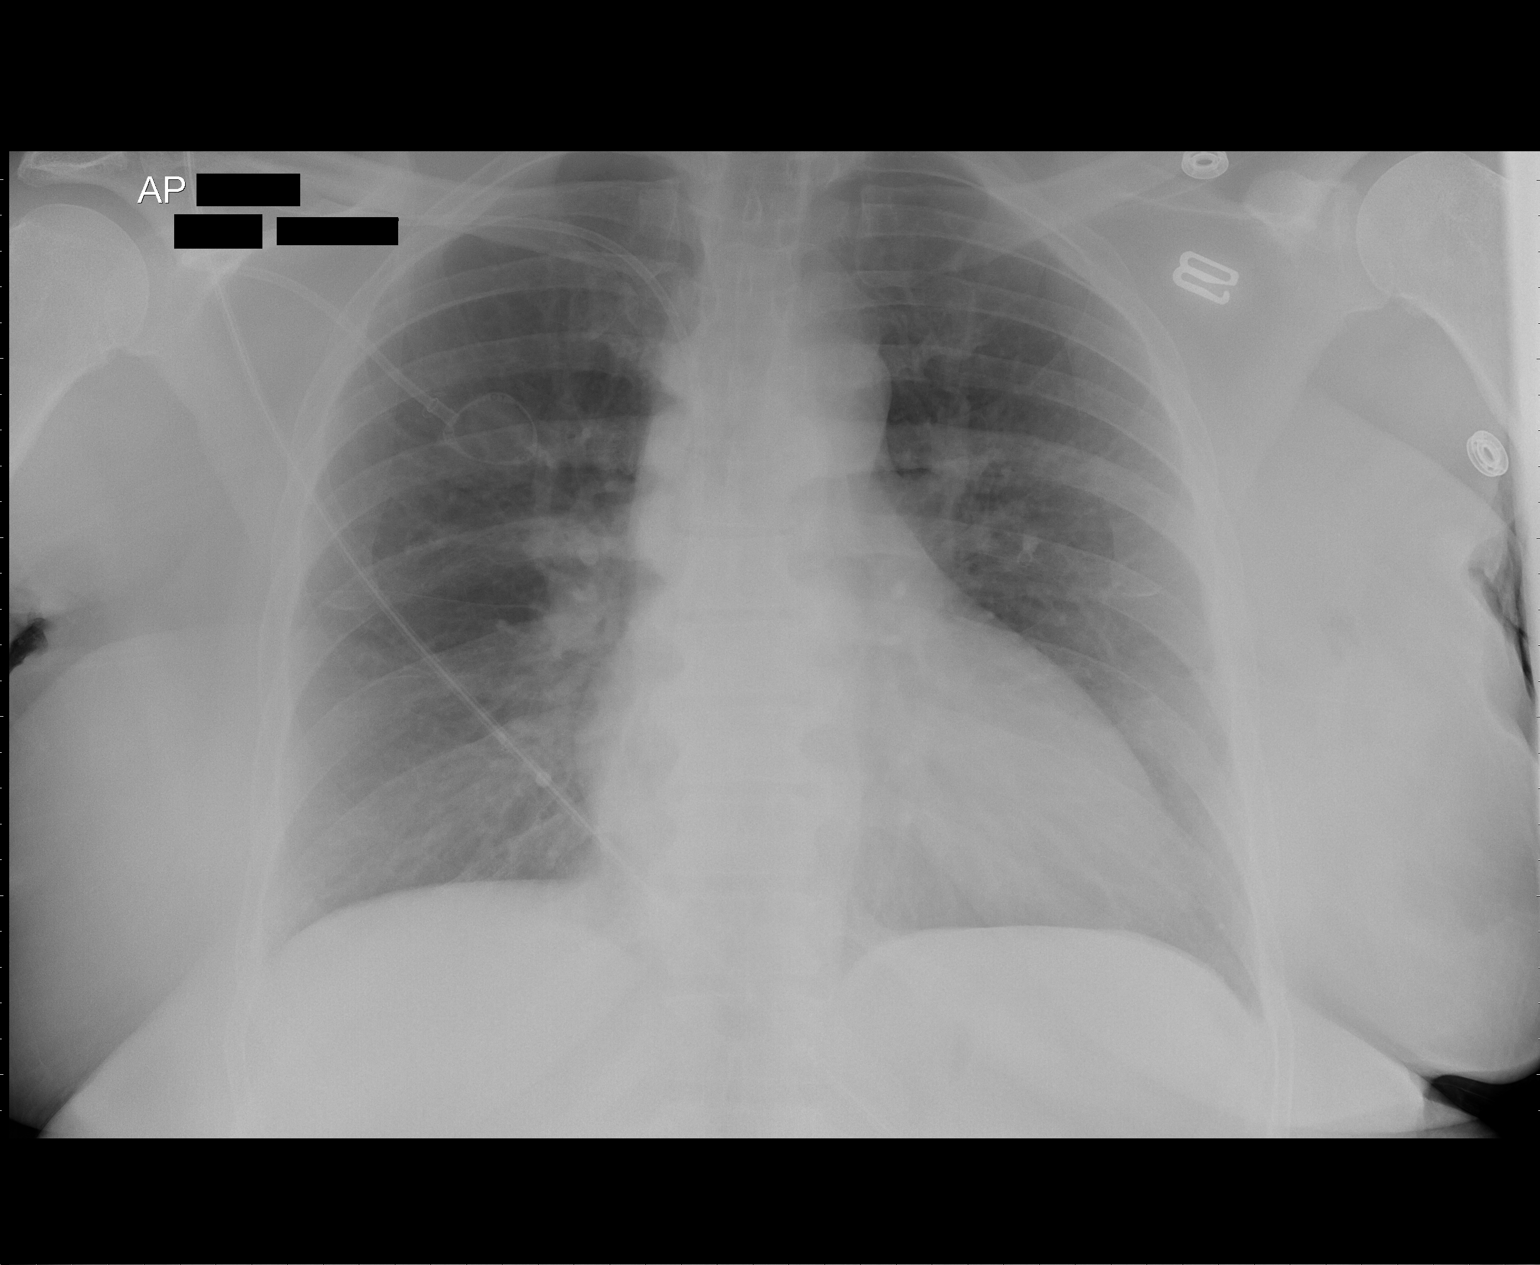

[1 of 1 positions shown; findings below may reference images not displayed]

FINDINGS: Right-sided Port-A-Cath with the tip projecting over the SVC. There
is no focal parenchymal opacity. There is no pleural effusion or
pneumothorax. The heart and mediastinal contours are unremarkable.

The osseous structures are unremarkable.
IMPRESSION: Right-sided Port-A-Cath with the tip projecting over the SVC.

## 2018-01-23 IMAGING — DX DG CHEST 2V
2 series · 2 of 2 positions shown · non-contrast
Comparison: Chest radiograph 02/07/2017

CLINICAL DATA: Patient with fever.  History of breast cancer.

EXAM:
CHEST  2 VIEW

[chest pa]
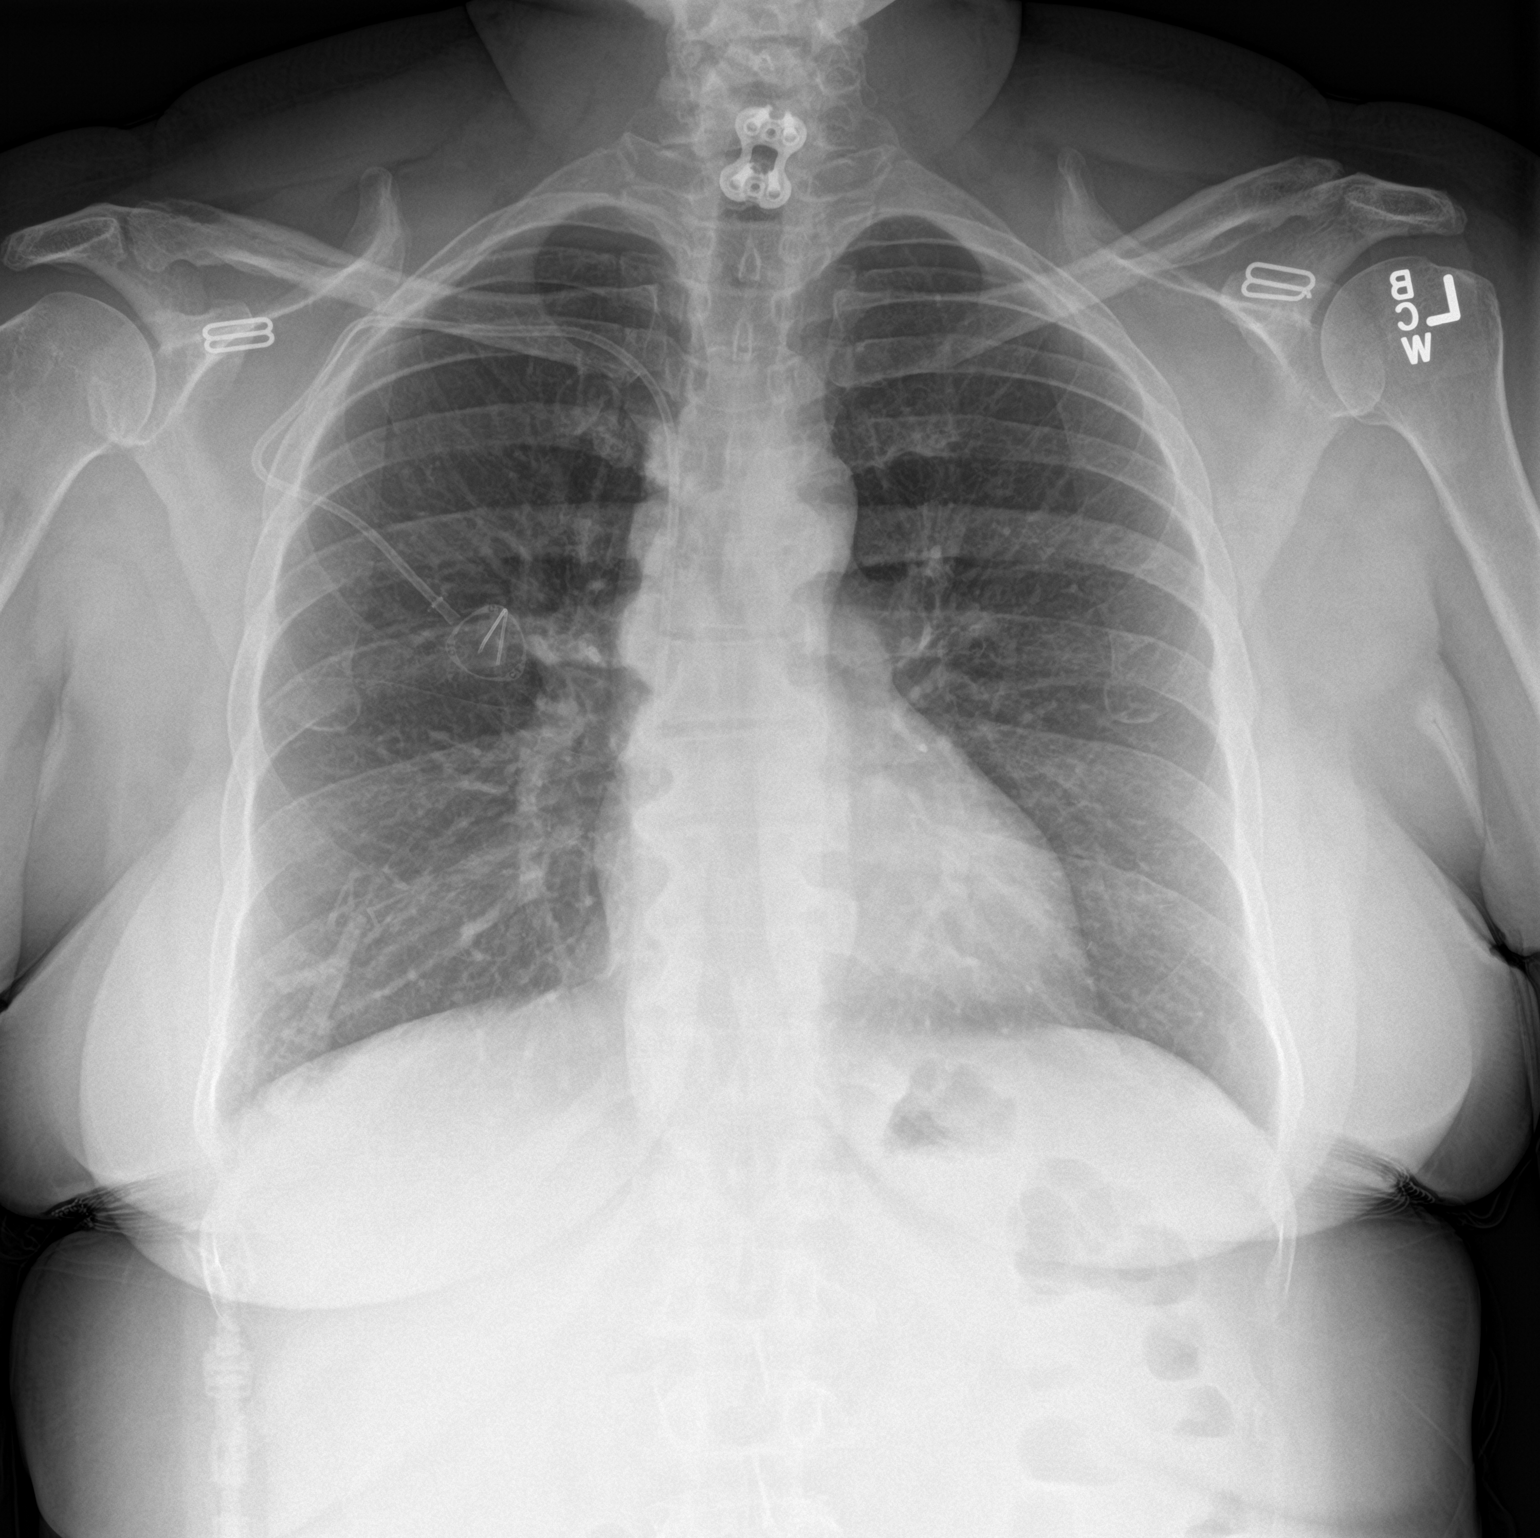

[chest lat]
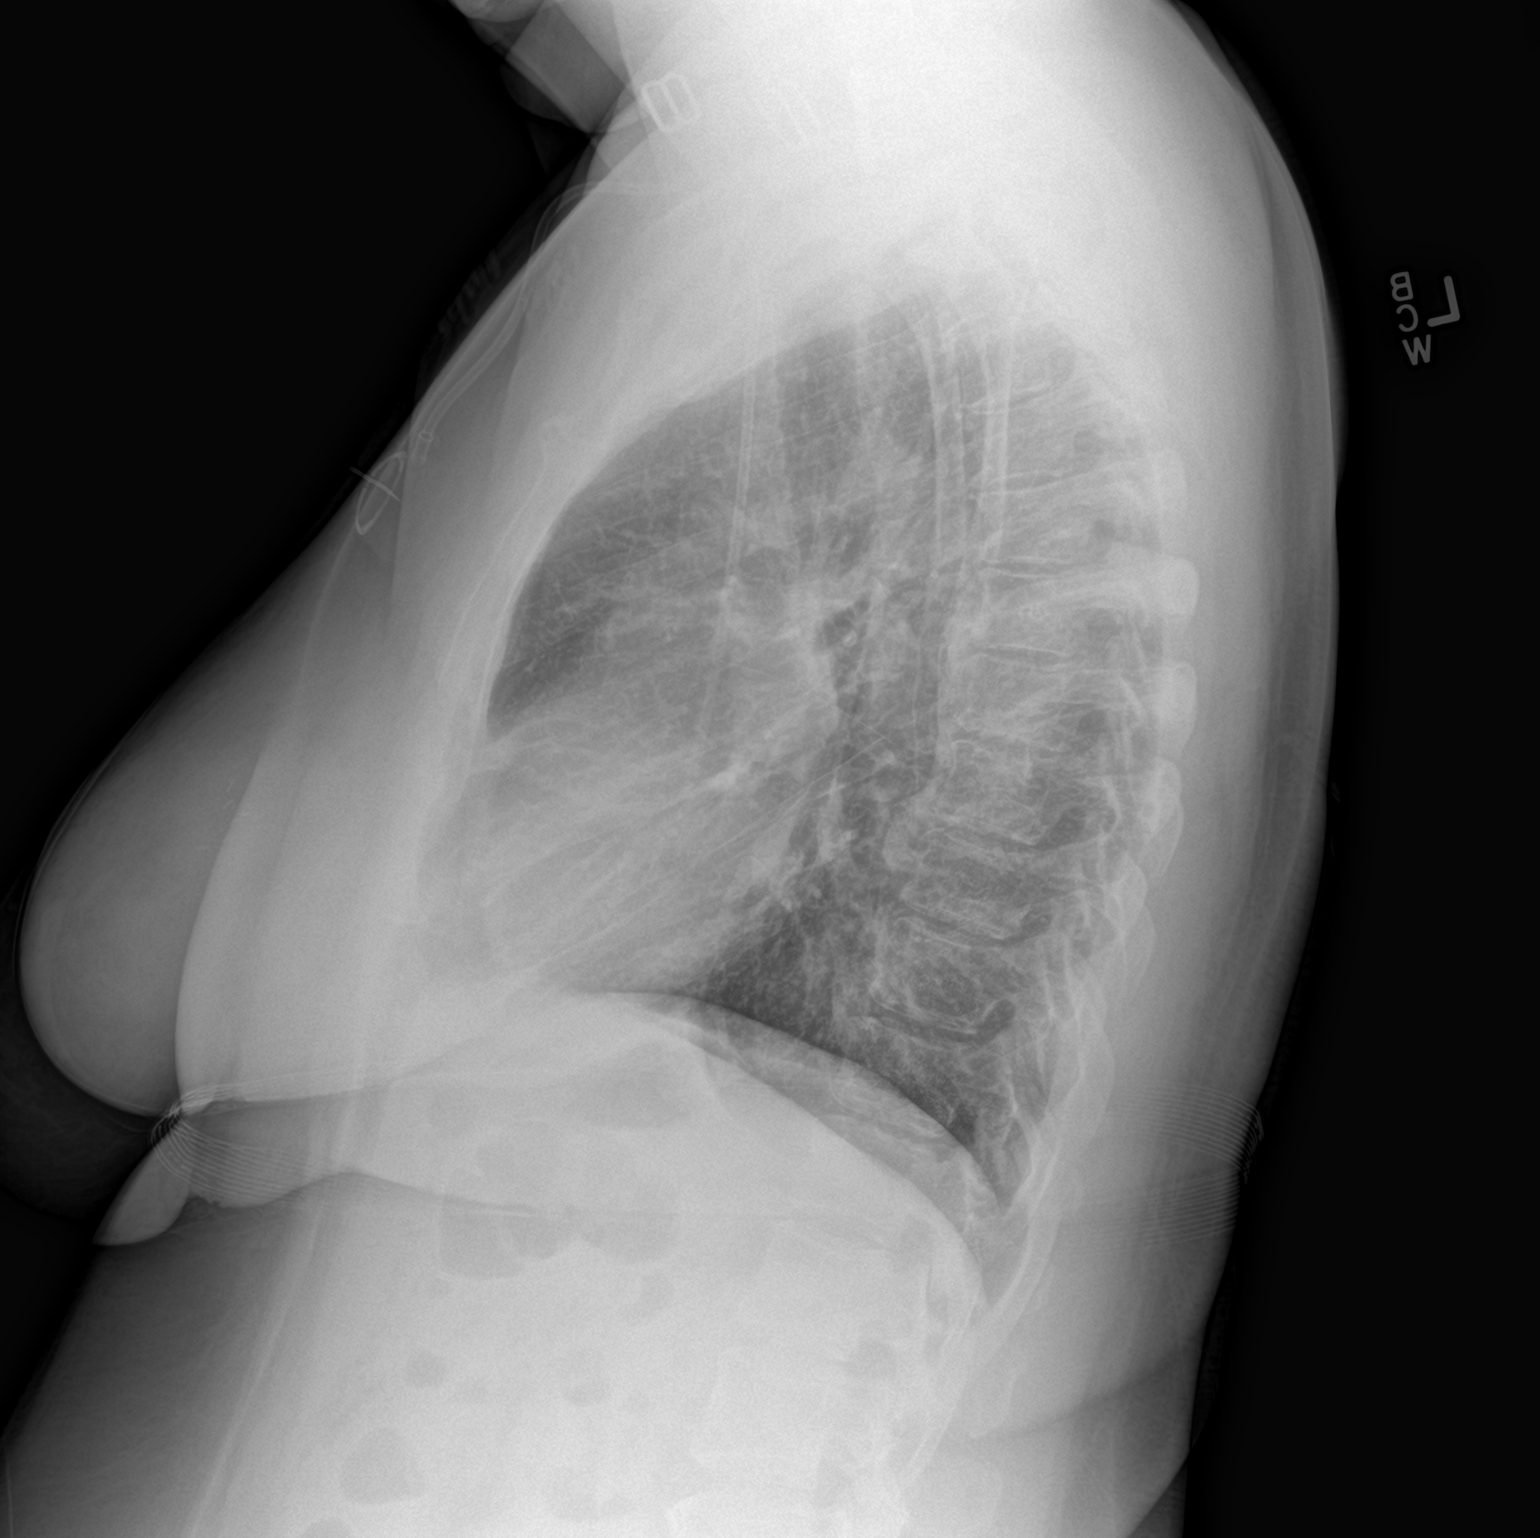

[2 of 2 positions shown; findings below may reference images not displayed]

FINDINGS: Right anterior chest wall Port-A-Cath is present tip projecting over
the superior vena cava. Anterior cervical spinal fusion hardware.
Normal cardiac and mediastinal contours. No consolidative pulmonary
opacities. No pleural effusion or pneumothorax. Regional skeleton is
unremarkable.
IMPRESSION: No active cardiopulmonary disease.

## 2018-02-24 IMAGING — CR DG CHEST 2V
2 series · 2 of 2 positions shown · non-contrast
Comparison: 02/09/2017

CLINICAL DATA: Weakness.  History of breast cancer.

EXAM:
CHEST  2 VIEW

[w chest pa]
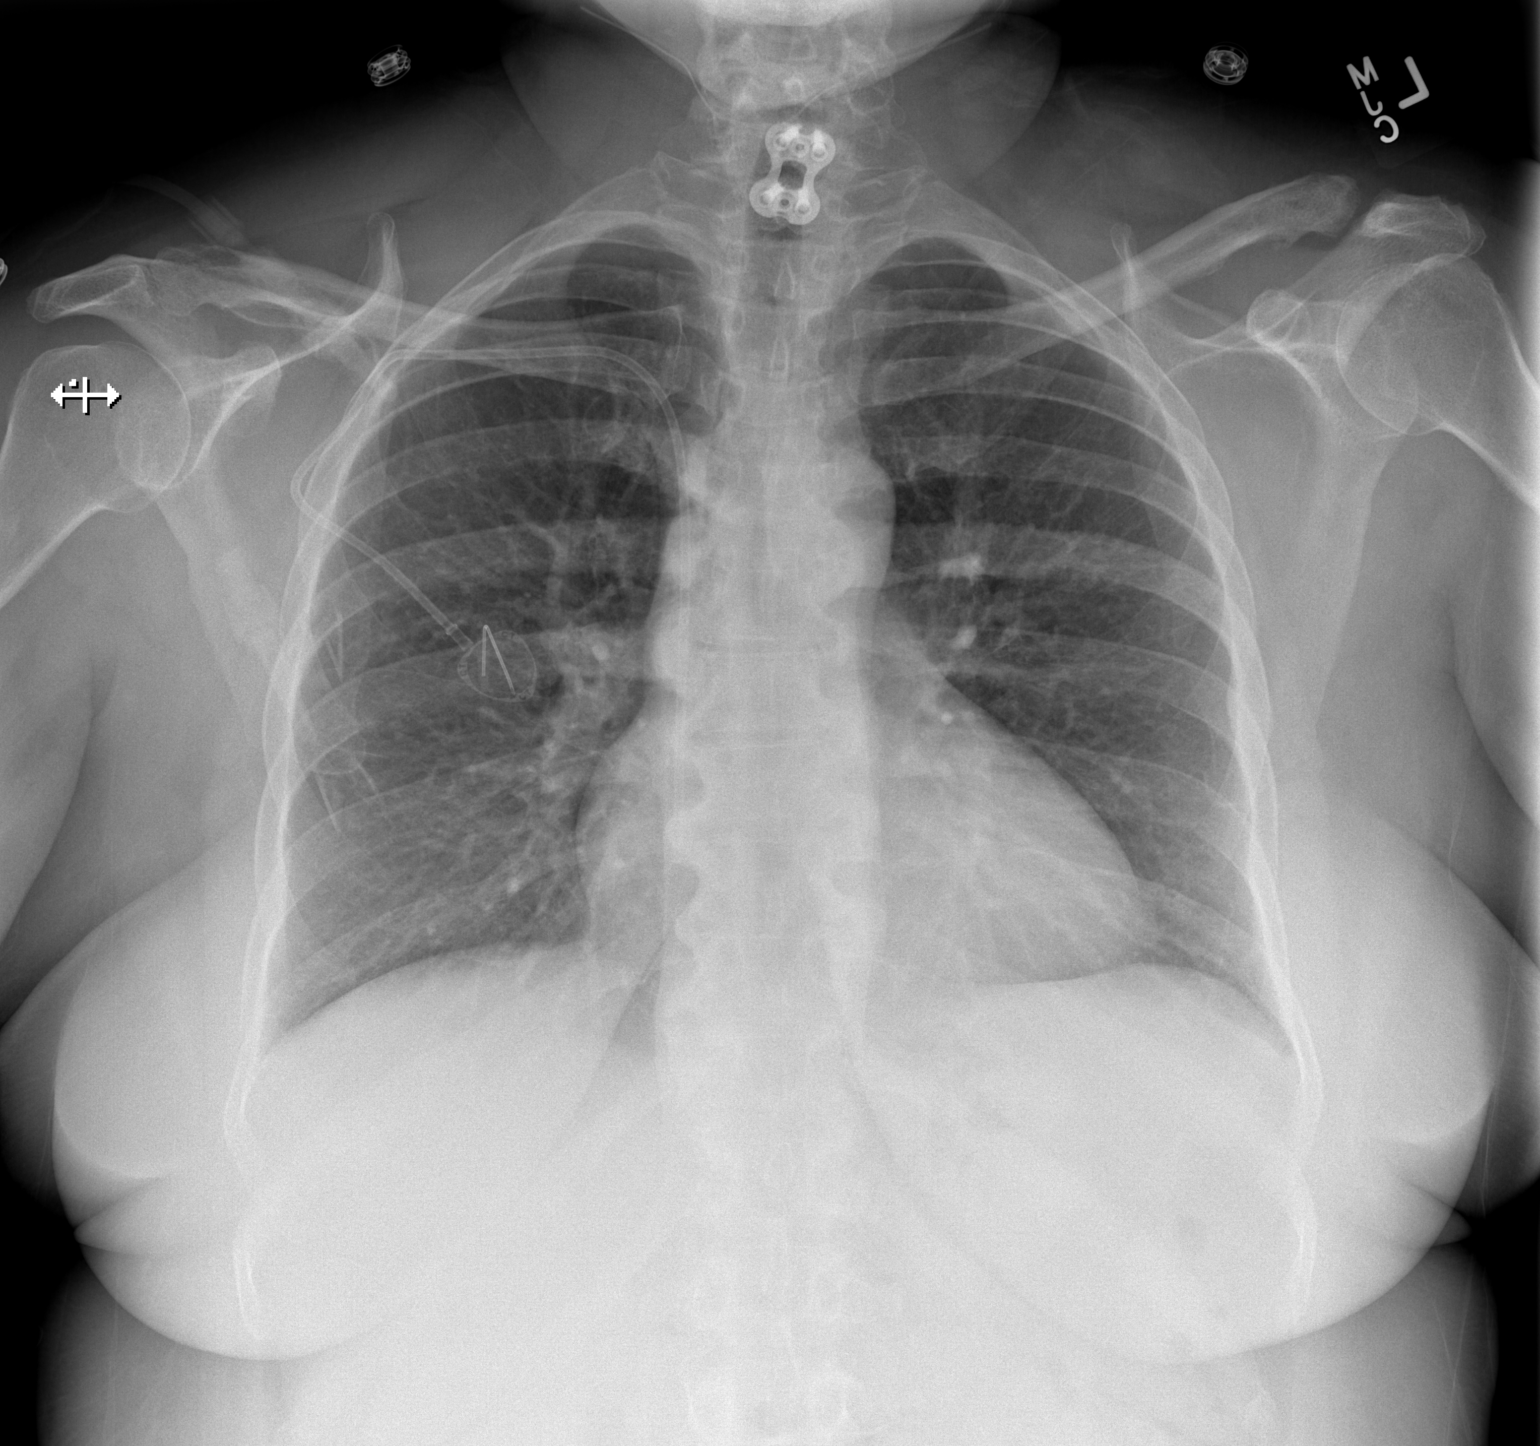

[w chest lat]
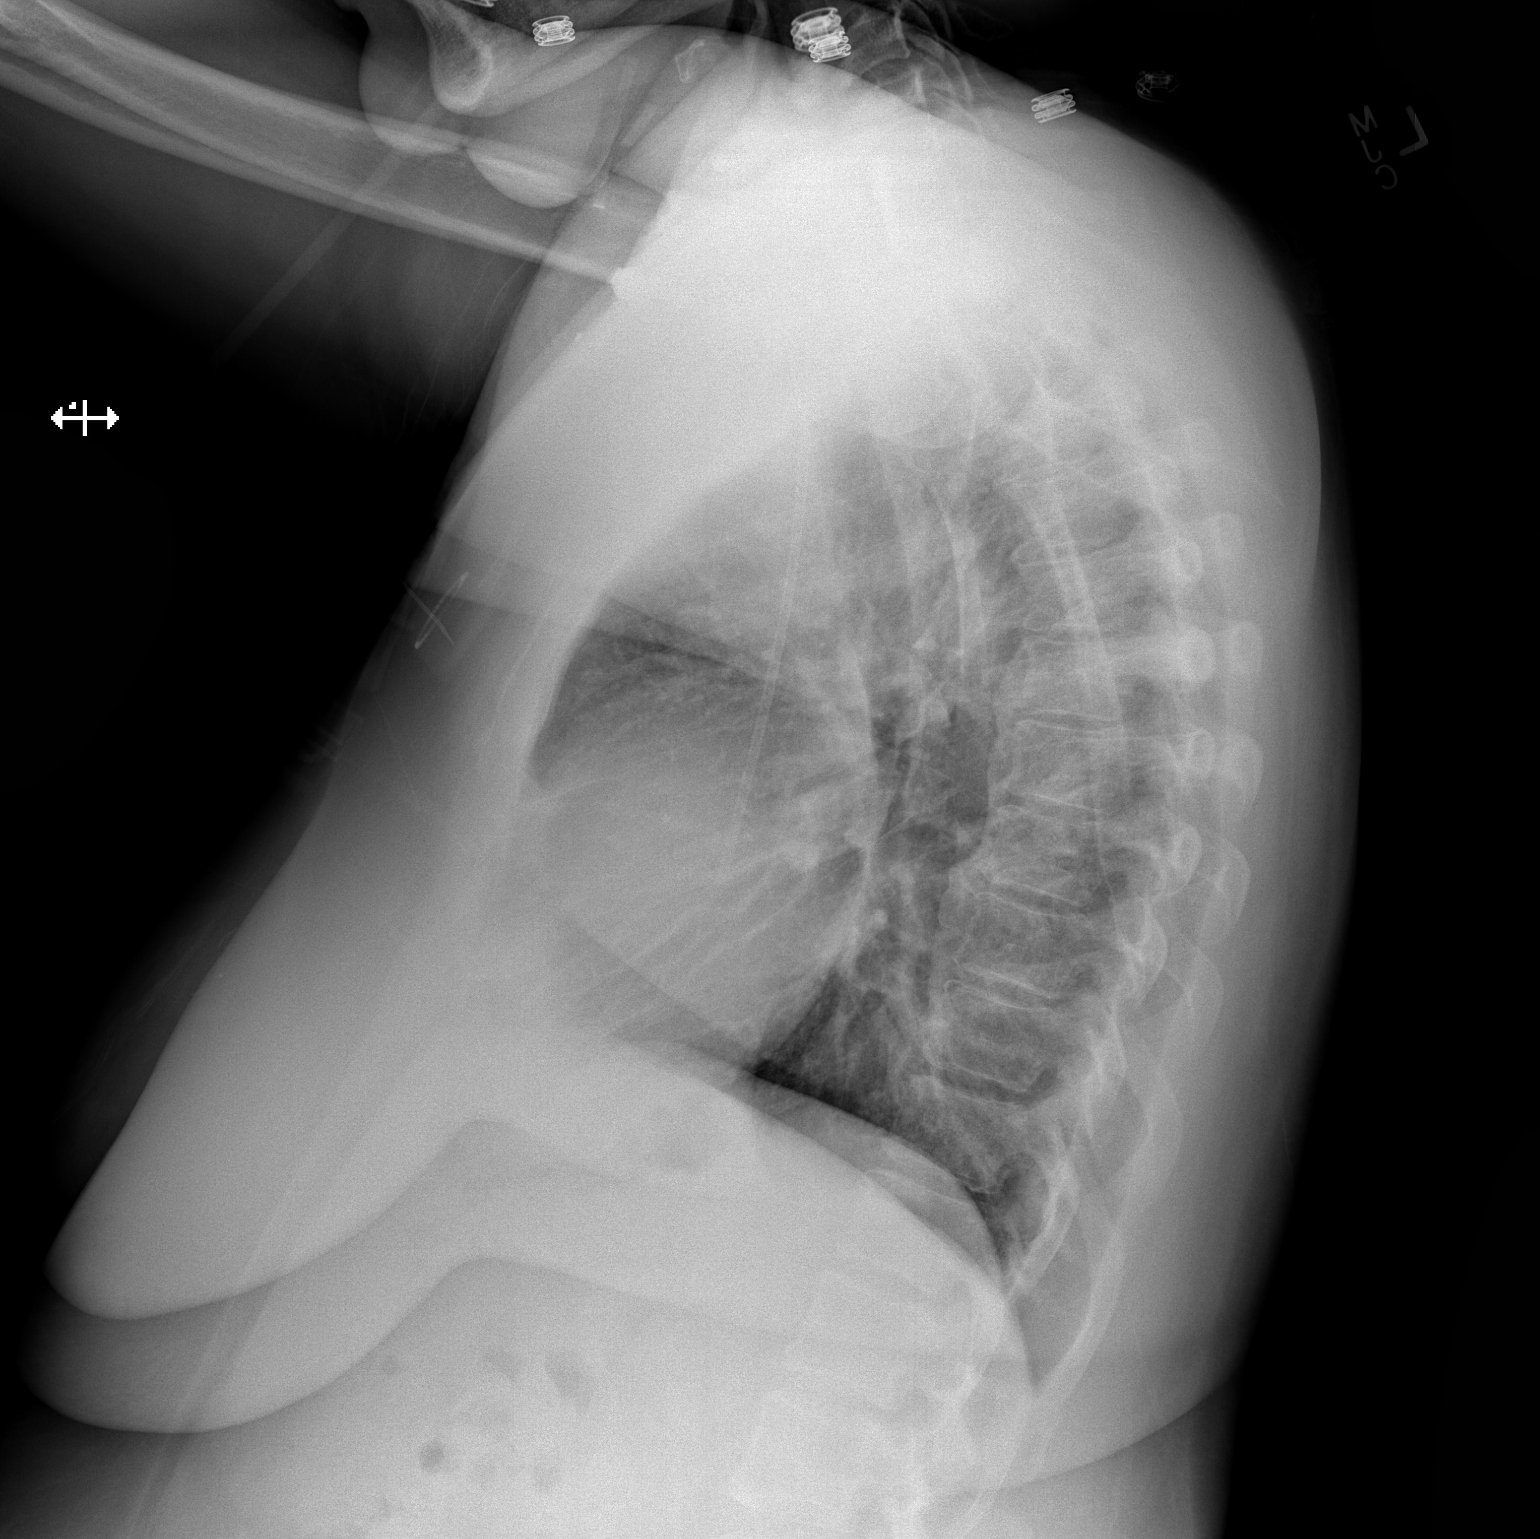

[2 of 2 positions shown; findings below may reference images not displayed]

FINDINGS: The heart size and mediastinal contours are within normal limits.
Port catheter tip is seen in the mid right atrium. Both lungs are
clear. The visualized skeletal structures are unremarkable. The
patient is status post ACDF of the lower cervical spine.
IMPRESSION: No active cardiopulmonary disease. Port catheter tip in the mid
right atrium on current exam, previously in the distal SVC -RA
juncture.

## 2018-03-12 ENCOUNTER — Ambulatory Visit: Payer: BLUE CROSS/BLUE SHIELD | Admitting: Oncology

## 2018-03-18 ENCOUNTER — Inpatient Hospital Stay: Payer: Self-pay | Attending: Oncology | Admitting: Oncology

## 2018-03-18 VITALS — BP 177/83 | HR 90 | Temp 98.8°F | Resp 18 | Ht 63.5 in | Wt 246.9 lb

## 2018-03-18 DIAGNOSIS — E119 Type 2 diabetes mellitus without complications: Secondary | ICD-10-CM

## 2018-03-18 DIAGNOSIS — Z6841 Body Mass Index (BMI) 40.0 and over, adult: Secondary | ICD-10-CM

## 2018-03-18 DIAGNOSIS — Z79810 Long term (current) use of selective estrogen receptor modulators (SERMs): Secondary | ICD-10-CM

## 2018-03-18 DIAGNOSIS — E669 Obesity, unspecified: Secondary | ICD-10-CM

## 2018-03-18 DIAGNOSIS — Z923 Personal history of irradiation: Secondary | ICD-10-CM

## 2018-03-18 DIAGNOSIS — Z170 Estrogen receptor positive status [ER+]: Secondary | ICD-10-CM

## 2018-03-18 DIAGNOSIS — C50212 Malignant neoplasm of upper-inner quadrant of left female breast: Secondary | ICD-10-CM

## 2018-03-18 DIAGNOSIS — Z803 Family history of malignant neoplasm of breast: Secondary | ICD-10-CM

## 2018-03-18 DIAGNOSIS — Z87891 Personal history of nicotine dependence: Secondary | ICD-10-CM

## 2018-03-18 DIAGNOSIS — Z9221 Personal history of antineoplastic chemotherapy: Secondary | ICD-10-CM

## 2018-03-18 NOTE — Progress Notes (Signed)
[place]  [phone] [phone]     ID: Abigail Yoder DOB: 01/17/1966  MR#: [id number]  IRJ#:[id number]  Patient Care Team: [name], MD as PCP - General (Internal Medicine) Alphonsa Overall, MD as Consulting Physician (General Surgery) Danel Requena, Virgie Dad, MD as Consulting Physician (Oncology) Eppie Gibson, MD as Attending Physician (Radiation Oncology) Jacelyn Pi, MD as Consulting Physician (Endocrinology) Ardis Hughs, MD as Attending Physician (Urology) Delice Bison Charlestine Massed, NP as Nurse Practitioner (Hematology and Oncology) Chauncey Cruel, MD OTHER MD:  CHIEF COMPLAINT: Estrogen receptor positive breast cancer  CURRENT TREATMENT: Tamoxifen   BREAST CANCER HISTORY: From the original intake note:  Hansika had bilateral routine screening mammography with tomography at [facility] 11/14/2016. The breast density was category B. In the left breast upper inner quadrant there was a 1.5 cm high density mass which was further evaluated with ultrasonography 11/16/2016. This confirmed a 1.2 cm lobulated mass in the left breast upper inner quadrant. The left axilla was benign.  Biopsy of the left breast mass in question 11/21/2016 showed (SAA 18-59) invasive ductal carcinoma, grade 3, estrogen and progesterone receptor positive, HER-2 nonamplified, with a signals ratio of 1.05 and the number per cell 2.15, with an MIB-1 of 90%.  Her subsequent history is as detailed below  INTERVAL HISTORY: Abigail Yoder returns today for follow-up of her estrogen receptor positive breast cancer accompanied by her mother.  [name] has been on tamoxifen now since September.  He has occasional hot flashes but they are not a major concern.  She does have significant vaginal dryness.  She is wearing a pad for that.  Aside from that she is tolerating it well.  REVIEW OF SYSTEMS: [name] went back to work full-time in November 2018.  She finds it stressful but still likes the  job.  She has gained weight and wonders if tamoxifen could be the cause of that.  She has diabetes which is not particularly well controlled she tells me at this time and she is not on a strict diet for it.  She is not exercising regularly.  A detailed review of systems today was otherwise stable  PAST MEDICAL HISTORY: Past Medical History:  Diagnosis Date  . Anemia   . Diabetes mellitus   . Family history of breast cancer   . History of radiation therapy 06/10/17- 07/08/17   Left Breast 2.67 Gy in 15 fractions. Left Breast boost 2 Gy in 5 fractions  . Hypertension   . Obesity     PAST SURGICAL HISTORY: Past Surgical History:  Procedure Laterality Date  . APPENDECTOMY    . BREAST LUMPECTOMY WITH RADIOACTIVE SEED AND SENTINEL LYMPH NODE BIOPSY Left 12/31/2016   Procedure: BREAST LUMPECTOMY WITH RADIOACTIVE SEED AND SENTINEL LYMPH NODE BIOPSY;  Surgeon: Alphonsa Overall, MD;  Location: [place];  Service: General;  Laterality: Left;  . CERVICAL SPINE SURGERY    . CESAREAN SECTION    . IR REMOVAL TUN ACCESS W/ PORT W/O FL MOD SED  10/18/2017  . KNEE SURGERY Left   . PORTACATH PLACEMENT N/A 12/31/2016   Procedure: INSERTION PORT-A-CATH WITH [place];  Surgeon: Alphonsa Overall, MD;  Location: [place];  Service: General;  Laterality: N/A;  . TUBAL LIGATION      FAMILY HISTORY Family History  Problem Relation Age of Onset  . Diabetes Mother   . Arthritis Mother   . Hypertension Father   . Heart disease Father   . Hyperlipidemia Father   .  Breast cancer Sister 24       came back 17 years later  . Stroke Maternal Uncle   . Stroke Maternal Grandmother   The patient's father died at the age of 45 from a brain aneurysm. The patient's mother died at age 31 following total hip replacement. The patient had 4 brothers, 5 sisters. One sister was diagnosed with breast cancer at age 98. There is no history of ovarian cancer in the family.  GYNECOLOGIC HISTORY:  No LMP  recorded. (Menstrual status: Perimenopausal). Menarche age 68, first live birth age 26, the patient is [place] P2. She is perimenopausal and her most recent period was October 2017. She never used oral contraceptives.  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 Her husband [name] works in a Proofreader. Daughter [name] works for the [employer] in [place]. Son show more right works for the Glass blower/designer at [employer] in [place]. The patient has 2 grandchildren. She attends a local [place]: Not in place   HEALTH MAINTENANCE: Social History   Tobacco Use  . Smoking status: Former Smoker    Last attempt to quit: 11/19/1994    Years since quitting: 23.3  . Smokeless tobacco: Never Used  Substance Use Topics  . Alcohol use: No  . Drug use: No     Colonoscopy:Never  PAP:2015  Bone density:Never   Allergies  Allergen Reactions  . Pineapple Anaphylaxis and Hives    Current Outpatient Medications  Medication Sig Dispense Refill  . amLODipine (NORVASC) 5 MG tablet Take 5 mg by mouth daily.    . insulin aspart protamine- aspart (NOVOLOG MIX 70/30) (70-30) 100 UNIT/ML injection Inject 40 Units into the skin 2 (two) times daily with a meal.     . tamoxifen (NOLVADEX) 20 MG tablet Take 20 mg by mouth daily.     No current facility-administered medications for this visit.     OBJECTIVE: Middle-aged African-American woman in no acute distress Vitals:   03/18/18 0924  BP: (!) 177/83  Pulse: 90  Resp: 18  Temp: 98.8 F (37.1 C)  SpO2: 99%     Body mass index is 43.05 kg/m.    ECOG FS:2 - Symptomatic, <50% confined to bed [facility] Weights   03/18/18 0924  Weight: 246 lb 14.4 oz (112 kg)   Sclerae unicteric, EOMs intact Oropharynx clear and moist No cervical or supraclavicular adenopathy Lungs no rales or rhonchi Heart regular rate  and rhythm Abd soft, nontender, positive bowel sounds MSK no focal spinal tenderness, no upper extremity lymphedema Neuro: nonfocal, well oriented, appropriate affect Breasts: The right breast is unremarkable.  The left breast is status post lumpectomy and radiation.  There is no evidence of local recurrence.  Both axillae are benign.    LAB RESULTS:  CMP     Component Value Date/Time   NA 141 07/04/2017 0850   K 3.8 07/04/2017 0850   CL 111 03/18/2017 0445   CO2 26 07/04/2017 0850   GLUCOSE 125 07/04/2017 0850   GLUCOSE 333 (H) 09/05/2006 1306   BUN 15.3 07/04/2017 0850   CREATININE 1.0 07/04/2017 0850   CALCIUM 9.4 07/04/2017 0850   PROT 7.1 07/04/2017 0850   ALBUMIN 2.6 (L) 07/04/2017 0850   AST 18 07/04/2017 0850   ALT 10 07/04/2017 0850   ALKPHOS 86 07/04/2017 0850   BILITOT <0.22 07/04/2017 0850   GFRNONAA >  60 03/18/2017 0445   GFRAA >60 03/18/2017 0445    INo results found for: SPEP, UPEP  Lab Results  Component Value Date   WBC 6.1 10/18/2017   NEUTROABS 2.7 07/04/2017   HGB 10.2 (L) 10/18/2017   HCT 31.1 (L) 10/18/2017   MCV 93.7 10/18/2017   PLT 158 10/18/2017      Chemistry      Component Value Date/Time   NA 141 07/04/2017 0850   K 3.8 07/04/2017 0850   CL 111 03/18/2017 0445   CO2 26 07/04/2017 0850   BUN 15.3 07/04/2017 0850   CREATININE 1.0 07/04/2017 0850      Component Value Date/Time   CALCIUM 9.4 07/04/2017 0850   ALKPHOS 86 07/04/2017 0850   AST 18 07/04/2017 0850   ALT 10 07/04/2017 0850   BILITOT <0.22 07/04/2017 0850       No results found for: LABCA2  No components found for: [id number]  No results for input(s): INR in the last 168 hours.  Urinalysis    Component Value Date/Time   COLORURINE STRAW (A) 03/13/2017 2011   APPEARANCEUR CLEAR 03/13/2017 2011   LABSPEC 1.011 03/13/2017 2011   LABSPEC 1.010 02/07/2017 1005   PHURINE 7.0 03/13/2017 2011   GLUCOSEU NEGATIVE 03/13/2017 2011   GLUCOSEU Negative 02/07/2017 1005     HGBUR NEGATIVE 03/13/2017 2011   BILIRUBINUR NEGATIVE 03/13/2017 2011   BILIRUBINUR Negative 02/07/2017 [place] 03/13/2017 2011   PROTEINUR 100 (A) 03/13/2017 2011   UROBILINOGEN 0.2 02/07/2017 1005   NITRITE NEGATIVE 03/13/2017 2011   LEUKOCYTESUR NEGATIVE 03/13/2017 2011   LEUKOCYTESUR Negative 02/07/2017 1005     STUDIES: She will be due for repeat mammography December 2019  ELIGIBLE FOR AVAILABLE RESEARCH PROTOCOL: No   ASSESSMENT: 52 y.o. [place] woman status post left breast upper inner quadrant biopsy 11/21/2016 for a clinical T1c N0, stage IA invasive ductal carcinoma, grade 3, estrogen and progesterone receptor positive, HER-2 not amplified, with an MIB-1 of 90%  (1) Oncotype DX Score of 60 predicts a risk of recurrence outside the breast of 34% if the patient's only systemic therapy is tamoxifen for 5 years. On a similar database this risk drops to about 12% if chemotherapy is also given  (2) genetics testing sent 11/28/2016; The Breast/GYN gene panel offered by [facility] includes sequencing and rearrangement analysis for the following 23 genes:  ATM, BARD1, BRCA1, BRCA2, BRIP1, CDH1, CHEK2, EPCAM, FANCC, MLH1, MSH2, MSH6, MUTYH, NBN, NF1, PALB2, PMS2, POLD1, PTEN, RAD51C, RAD51D, RECQL, and TP53.   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    (3) status post left breast lumpectomy on 12/31/2016: invasive ductal carcinoma, 1.8cm, grade 3, margins negative, 1 SLN negative, ER+(80%), PR-(2%), Ki-67 90%, HER-2 negative (ratio 1.05).  T1cN0  (4) chemotherapy consisting of cyclophosphamide and doxorubicin in dose dense fashion 4 started 01/17/17, completed 03/07/2017,followed by Abraxane weekly 12.  (a)  cycle 3 AC delayed because of febrile neutropenia  (b) febrile neutropenia also occurred after cycle 4 AC  (c) Cycle 2 and 4 of Abraxane delayed because of  intolerance  (d) Abraxane discontinued after 4 doses secondary to poor marrow response; last dose 05/16/2017  (5) adjuvant radiation 06/10/2017 - 07/08/2017 Site/dose: 1. Left Breast: 40.05 Gy in 15 fractions  2. Left Breast Boost: 10 Gy in 5 fractions   (6) tamoxifen started 08/09/2017  PLAN: Dolores is now a little over  a year out from definitive surgery for her breast cancer with no evidence of disease recurrence.  This is favorable.  She is tolerating tamoxifen well and the plan will be to continue that a minimum of 5 years, possibly 10.  As far as her weight gain is concerned I reassured her that tamoxifen is not the cause.  I strongly suggested that she eliminate pasta, bread, potatoes, and rise from her diet and that she start a regular exercise program.  This will be helpful also as far as her diabetic control is concerned.  She will be due for her next mammography in December.  She will see me again next February.  She knows to call for any other issues that may develop before that visit.  Chauncey Cruel, MD   03/18/2018 9:45 AM Medical Oncology and Hematology [facility] [address] [place], [place] [address] Tel. [phone]    Fax. [phone]

## 2018-07-16 ENCOUNTER — Other Ambulatory Visit: Payer: Self-pay | Admitting: Oncology

## 2018-11-25 ENCOUNTER — Encounter: Payer: Self-pay | Admitting: Oncology

## 2018-12-25 ENCOUNTER — Other Ambulatory Visit: Payer: Self-pay

## 2018-12-25 DIAGNOSIS — C50919 Malignant neoplasm of unspecified site of unspecified female breast: Secondary | ICD-10-CM

## 2018-12-25 DIAGNOSIS — D649 Anemia, unspecified: Secondary | ICD-10-CM

## 2018-12-29 ENCOUNTER — Inpatient Hospital Stay: Payer: Managed Care, Other (non HMO) | Attending: Oncology | Admitting: Oncology

## 2018-12-29 ENCOUNTER — Inpatient Hospital Stay: Payer: Managed Care, Other (non HMO)

## 2018-12-29 NOTE — Progress Notes (Signed)
NO SHOW

## 2019-01-03 ENCOUNTER — Encounter: Payer: Self-pay | Admitting: Oncology

## 2019-01-03 ENCOUNTER — Other Ambulatory Visit: Payer: Self-pay | Admitting: Nurse Practitioner

## 2019-03-04 ENCOUNTER — Encounter: Payer: Self-pay | Admitting: Licensed Clinical Social Worker

## 2019-03-04 NOTE — Progress Notes (Signed)
UPDATE: RAD51D c.919G>A VUS has been reclassified to "Likely Benign." The report date is 03/04/2019.  

## 2019-05-28 ENCOUNTER — Other Ambulatory Visit: Payer: Self-pay | Admitting: Nephrology

## 2019-05-28 DIAGNOSIS — N183 Chronic kidney disease, stage 3 unspecified: Secondary | ICD-10-CM

## 2019-06-03 ENCOUNTER — Ambulatory Visit
Admission: RE | Admit: 2019-06-03 | Discharge: 2019-06-03 | Disposition: A | Discharge status: Home / Self Care | Payer: Managed Care, Other (non HMO) | Source: Ambulatory Visit | Attending: Nephrology | Admitting: Nephrology

## 2019-06-03 ENCOUNTER — Other Ambulatory Visit: Payer: Self-pay

## 2019-06-03 DIAGNOSIS — N183 Chronic kidney disease, stage 3 unspecified: Secondary | ICD-10-CM

## 2019-09-26 ENCOUNTER — Other Ambulatory Visit: Payer: Self-pay | Admitting: Oncology

## 2019-12-01 DIAGNOSIS — R928 Other abnormal and inconclusive findings on diagnostic imaging of breast: Secondary | ICD-10-CM | POA: Diagnosis not present

## 2019-12-07 DIAGNOSIS — N184 Chronic kidney disease, stage 4 (severe): Secondary | ICD-10-CM | POA: Diagnosis not present

## 2019-12-07 DIAGNOSIS — I129 Hypertensive chronic kidney disease with stage 1 through stage 4 chronic kidney disease, or unspecified chronic kidney disease: Secondary | ICD-10-CM | POA: Diagnosis not present

## 2019-12-07 DIAGNOSIS — D631 Anemia in chronic kidney disease: Secondary | ICD-10-CM | POA: Diagnosis not present

## 2019-12-07 DIAGNOSIS — R809 Proteinuria, unspecified: Secondary | ICD-10-CM | POA: Diagnosis not present

## 2019-12-23 ENCOUNTER — Telehealth: Payer: Self-pay | Admitting: Oncology

## 2019-12-23 NOTE — Telephone Encounter (Signed)
Returned patient's phone call regarding rescheduling missed 02/10 appointment, per patient's request appointment has moved to 02/15.

## 2020-01-01 ENCOUNTER — Other Ambulatory Visit: Payer: Self-pay | Admitting: *Deleted

## 2020-01-01 DIAGNOSIS — C50212 Malignant neoplasm of upper-inner quadrant of left female breast: Secondary | ICD-10-CM

## 2020-01-01 DIAGNOSIS — Z170 Estrogen receptor positive status [ER+]: Secondary | ICD-10-CM

## 2020-01-03 NOTE — Progress Notes (Signed)
[place]  Telephone:([phone] Fax:(336) [phone]     ID: Abigail Yoder DOB: [birth year]  MR#: [id number]  BEE#:[id number]  Patient Care Team: [name], MD as PCP - General (Internal Medicine) Alphonsa Overall, MD as Consulting Physician (General Surgery) Elona Yinger, Virgie Dad, MD as Consulting Physician (Oncology) Eppie Gibson, MD as Attending Physician (Radiation Oncology) Jacelyn Pi, MD as Consulting Physician (Endocrinology) Ardis Hughs, MD as Attending Physician (Urology) Delice Bison Charlestine Massed, NP as Nurse Practitioner (Hematology and Oncology) Chauncey Cruel, MD OTHER MD:  CHIEF COMPLAINT: Estrogen receptor positive breast cancer  CURRENT TREATMENT: Tamoxifen   INTERVAL HISTORY: Abigail Yoder returns today for follow-up of her estrogen receptor positive breast cancer.   She continues on tamoxifen.  He has occasional hot flashes especially at night.  They are not frequent.  They do wake her up sometimes.  She does have some vaginal wetness.  Again that is not a major issue for her  Her most recent bilateral diagnostic mammography with tomography was at [facility] on 12/05/2019.  There was no evidence of malignancy in either breast.  She underwent renal ultrasound on 06/03/2019 for her chronic renal disease. This showed: renal echogenicity mildly increased bilaterally; renal cortical thickness normal; no obstructing focus on either side.   REVIEW OF SYSTEMS: Abigail Yoder tells me she now has been found to have significant chronic kidney disease and is seeing a nephrologist.  Some of her medications have changed and I have updated her medication list based on her recall.  She is working at [employer] different centers in [employer] in [place] taking blood pressures etc. and therefore she is at quite a bit of risk ofCovid 19.  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  Her husband and son also work outside of the home.   Aside from these issues a detailed review of systems today was stable.  BREAST CANCER HISTORY: From the original intake note:  Sachiko had bilateral routine screening mammography with tomography at [facility] 11/14/2016. The breast density was category B. In the left breast upper inner quadrant there was a 1.5 cm high density mass which was further evaluated with ultrasonography 11/16/2016. This confirmed a 1.2 cm lobulated mass in the left breast upper inner quadrant. The left axilla was benign.  Biopsy of the left breast mass in question 11/21/2016 showed (SAA 18-59) invasive ductal carcinoma, grade 3, estrogen and progesterone receptor positive, HER-2 nonamplified, with a signals ratio of 1.05 and the number per cell 2.15, with an MIB-1 of 90%.  Her subsequent history is as detailed below   PAST MEDICAL HISTORY: Past Medical History:  Diagnosis Date  . Anemia   . Diabetes mellitus   . Family history of breast cancer   . History of radiation therapy 06/10/17- 07/08/17   Left Breast 2.67 Gy in 15 fractions. Left Breast boost 2 Gy in 5 fractions  . Hypertension   . Obesity     PAST SURGICAL HISTORY: Past Surgical History:  Procedure Laterality Date  . APPENDECTOMY    . BREAST LUMPECTOMY WITH RADIOACTIVE SEED AND SENTINEL LYMPH NODE BIOPSY Left 12/31/2016   Procedure: BREAST LUMPECTOMY WITH RADIOACTIVE SEED AND SENTINEL LYMPH NODE BIOPSY;  Surgeon: Alphonsa Overall, MD;  Location: [place];  Service: General;  Laterality: Left;  . CERVICAL SPINE SURGERY    . CESAREAN SECTION    . IR REMOVAL TUN ACCESS W/ PORT W/O FL MOD SED  10/18/2017  .  KNEE SURGERY Left   . PORTACATH PLACEMENT N/A 12/31/2016   Procedure: INSERTION PORT-A-CATH WITH [place];  Surgeon: Alphonsa Overall, MD;  Location: [place];  Service: General;  Laterality: N/A;  . TUBAL LIGATION      FAMILY HISTORY Family History  Problem Relation Age of Onset  . Diabetes Mother   . Arthritis Mother   .  Hypertension Father   . Heart disease Father   . Hyperlipidemia Father   . Breast cancer Sister 2       came back 17 years later  . Stroke Maternal Uncle   . Stroke Maternal Grandmother   The patient's father died at the age of 62 from a brain aneurysm. The patient's mother died at age 76 following total hip replacement. The patient had 4 brothers, 5 sisters. One sister was diagnosed with breast cancer at age 66. There is no history of ovarian cancer in the family.   GYNECOLOGIC HISTORY:  No LMP recorded. (Menstrual status: Perimenopausal). Menarche age 34, first live birth age 54, the patient is Abigail Yoder P2. She is perimenopausal and her most recent period was October 2017. She never used oral contraceptives.   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 Her husband [name] works in a Proofreader. Daughter Abigail Yoder works for the [employer] in [place]. Son works for the [employer] at [employer] in [place]. The patient has 2 grandchildren. She attends a local [place] DIRECTIVES: In the absence of any documents to the contrary the patient's husband is her healthcare power of attorney   HEALTH MAINTENANCE: Social History   Tobacco Use  . Smoking status: Former Smoker    Quit date: 11/19/1994    Years since quitting: 25.1  . Smokeless tobacco: Never Used  Substance Use Topics  . Alcohol use: No  . Drug use: No     Colonoscopy:Never  PAP:2015  Bone density:Never   Allergies  Allergen Reactions  . Pineapple Anaphylaxis and Hives    Current Outpatient Medications  Medication Sig Dispense Refill  . carvedilol (COREG) 12.5 MG tablet Take 12.5 mg by mouth 2 (two) times daily with a meal.   6  . furosemide (LASIX) 20 MG tablet Take 20 mg by mouth daily.  6  . HUMALOG MIX 75/25 KWIKPEN (75-25) 100 UNIT/ML Kwikpen 15U/10U/15U THREE TIMES A DAY  SUBCUTANEOUS 90 DAYS  4  . tamoxifen (NOLVADEX) 20 MG tablet Take 20 mg by mouth daily.    . tamoxifen (NOLVADEX) 20 MG tablet TAKE 1 TABLET BY MOUTH EVERY DAY 90 tablet 12  . tamoxifen (NOLVADEX) 20 MG tablet TAKE 1 TABLET BY MOUTH EVERY DAY 90 tablet 12   No current facility-administered medications for this visit.    OBJECTIVE: Morbidly obese African-American woman in no acute distress Vitals:   01/04/20 1100  BP: (!) 169/71  Pulse: 88  Resp: 18  Temp: 98.2 F (36.8 C)  SpO2: 100%     Body mass index is 44.34 kg/m.    ECOG FS:2 - Symptomatic, <50% confined to bed Filed Weights   01/04/20 1100  Weight: 254 lb 4.8 oz (115.3 kg)    Sclerae unicteric, EOMs intact Wearing a mask No cervical or supraclavicular adenopathy Lungs no rales or rhonchi Heart regular rate and rhythm Abd soft, nontender, positive bowel sounds MSK no focal spinal tenderness Neuro: nonfocal, well oriented, appropriate affect Breasts:  The right breast is benign.  The left breast has undergone lumpectomy and radiation.  There is no evidence of local recurrence.  Both axillae are benign.   LAB RESULTS:  CMP     Component Value Date/Time   NA 141 07/04/2017 0850   K 3.8 07/04/2017 0850   CL 111 03/18/2017 0445   CO2 26 07/04/2017 0850   GLUCOSE 125 07/04/2017 0850   GLUCOSE 333 (H) 09/05/2006 1306   BUN 15.3 07/04/2017 0850   CREATININE 1.0 07/04/2017 0850   CALCIUM 9.4 07/04/2017 0850   PROT 7.1 07/04/2017 0850   ALBUMIN 2.6 (L) 07/04/2017 0850   AST 18 07/04/2017 0850   ALT 10 07/04/2017 0850   ALKPHOS 86 07/04/2017 0850   BILITOT <0.22 07/04/2017 0850   GFRNONAA >60 03/18/2017 0445   GFRAA >60 03/18/2017 0445    INo results found for: SPEP, UPEP  Lab Results  Component Value Date   WBC 5.4 01/04/2020   NEUTROABS 3.5 01/04/2020   HGB 9.7 (L) 01/04/2020   HCT 30.0 (L) 01/04/2020   MCV 92.3 01/04/2020   PLT 173 01/04/2020      Chemistry      Component Value Date/Time   NA 141  07/04/2017 0850   K 3.8 07/04/2017 0850   CL 111 03/18/2017 0445   CO2 26 07/04/2017 0850   BUN 15.3 07/04/2017 0850   CREATININE 1.0 07/04/2017 0850      Component Value Date/Time   CALCIUM 9.4 07/04/2017 0850   ALKPHOS 86 07/04/2017 0850   AST 18 07/04/2017 0850   ALT 10 07/04/2017 0850   BILITOT <0.22 07/04/2017 0850       No results found for: LABCA2  No components found for: [id number]  No results for input(s): INR in the last 168 hours.  Urinalysis    Component Value Date/Time   COLORURINE STRAW (A) 03/13/2017 2011   APPEARANCEUR CLEAR 03/13/2017 2011   LABSPEC 1.011 03/13/2017 2011   LABSPEC 1.010 02/07/2017 1005   PHURINE 7.0 03/13/2017 2011   GLUCOSEU NEGATIVE 03/13/2017 2011   GLUCOSEU Negative 02/07/2017 1005   HGBUR NEGATIVE 03/13/2017 2011   BILIRUBINUR NEGATIVE 03/13/2017 2011   BILIRUBINUR Negative 02/07/2017 [place] 03/13/2017 2011   PROTEINUR 100 (A) 03/13/2017 2011   UROBILINOGEN 0.2 02/07/2017 1005   NITRITE NEGATIVE 03/13/2017 2011   LEUKOCYTESUR NEGATIVE 03/13/2017 2011   LEUKOCYTESUR Negative 02/07/2017 1005     STUDIES: No results found.   ELIGIBLE FOR AVAILABLE RESEARCH PROTOCOL: No   ASSESSMENT: 54 y.o. [place] woman status post left breast upper inner quadrant biopsy 11/21/2016 for a clinical T1c N0, stage IA invasive ductal carcinoma, grade 3, estrogen and progesterone receptor positive, HER-2 not amplified, with an MIB-1 of 90%  (1) Oncotype DX Score of 60 predicts a risk of recurrence outside the breast of 34% if the patient's only systemic therapy is tamoxifen for 5 years. On a similar database this risk drops to about 12% if chemotherapy is also given  (2) genetics testing sent 11/28/2016; The Breast/GYN gene panel offered by [facility] includes sequencing and rearrangement analysis for the following 23 genes:  ATM, BARD1, BRCA1, BRCA2, BRIP1, CDH1, CHEK2, EPCAM, FANCC, MLH1, MSH2, MSH6, MUTYH, NBN, NF1, PALB2, PMS2,  POLD1, PTEN, RAD51C, RAD51D, RECQL, and TP53.   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    (3) status post left breast lumpectomy on 12/31/2016: invasive  ductal carcinoma, 1.8cm, grade 3, margins negative, 1 SLN negative, ER+(80%), PR-(2%), Ki-67 90%, HER-2 negative (ratio 1.05).  T1cN0  (4) chemotherapy consisting of cyclophosphamide and doxorubicin in dose dense fashion 4 started 01/17/17, completed 03/07/2017,followed by Abraxane weekly 12.  (a)  cycle 3 AC delayed because of febrile neutropenia  (b) febrile neutropenia also occurred after cycle 4 AC  (c) Cycle 2 and 4 of Abraxane delayed because of intolerance  (d) Abraxane discontinued after 4 doses secondary to poor marrow response; last dose 05/16/2017  (5) adjuvant radiation 06/10/2017 - 07/08/2017 Site/dose: 1. Left Breast: 40.05 Gy in 15 fractions  2. Left Breast Boost: 10 Gy in 5 fractions   (6) tamoxifen started 08/09/2017  (7) anemia of renal disease  (8) morbid obesity  (a) baseline weight 2008 was 252 pounds   PLAN: Abigail Yoder is now 2 years out from definitive surgery for her breast cancer with no evidence of disease recurrence.  This is very favorable.  She is tolerating tamoxifen well.  She does have some nighttime hot flashes and some vaginal discharge.  The plan is to continue this drug for total of 5 years.  She quit her gym membership as so many people have done because of the pandemic.  She does have to walk 3 flights to her apartment and of course she is on her feet at work as well.  I suggested she try to count her steps and see if she is doing 2 or 3 miles a day which would be very favorable.  She is concerned about her weight especially now that she has a renal diet.  We checked her weight in 2008, 12 years ago, and it was almost identical to today's, 252 then, 254 today.  She understands that tamoxifen is not  related to the weight issue.  Her anemia is related to her chronic kidney disease.  It does not need intervention per se at this point.  She will see me again in 1 year.  She knows to call for any other issue that may develop before then.  Total encounter time 25 minutes.Chauncey Cruel, MD   01/04/2020 11:07 AM Medical Oncology and Hematology [facility] [place], [place] [address] Tel. [phone]    Fax. [phone]   I, Wilburn Mylar, am acting as scribe for Dr. Virgie Dad. Sunshyne Horvath.  I, Lurline Del MD, have reviewed the above documentation for accuracy and completeness, and I agree with the above.    *Total Encounter Time as defined by the Centers for Medicare and Medicaid Services includes, in addition to the face-to-face time of a patient visit (documented in the note above) non-face-to-face time: obtaining and reviewing outside history, ordering and reviewing medications, tests or procedures, care coordination (communications with other health care professionals or caregivers) and documentation in the medical record.

## 2020-01-04 ENCOUNTER — Other Ambulatory Visit: Payer: Self-pay

## 2020-01-04 ENCOUNTER — Inpatient Hospital Stay (HOSPITAL_BASED_OUTPATIENT_CLINIC_OR_DEPARTMENT_OTHER): Payer: BC Managed Care – PPO | Admitting: Oncology

## 2020-01-04 ENCOUNTER — Inpatient Hospital Stay: Payer: BC Managed Care – PPO | Attending: Oncology

## 2020-01-04 ENCOUNTER — Telehealth: Payer: Self-pay | Admitting: Oncology

## 2020-01-04 ENCOUNTER — Other Ambulatory Visit: Payer: Self-pay | Admitting: Oncology

## 2020-01-04 ENCOUNTER — Encounter: Payer: Self-pay | Admitting: Oncology

## 2020-01-04 VITALS — BP 169/71 | HR 88 | Temp 98.2°F | Resp 18 | Ht 63.5 in | Wt 254.3 lb

## 2020-01-04 DIAGNOSIS — N951 Menopausal and female climacteric states: Secondary | ICD-10-CM | POA: Insufficient documentation

## 2020-01-04 DIAGNOSIS — N184 Chronic kidney disease, stage 4 (severe): Secondary | ICD-10-CM | POA: Insufficient documentation

## 2020-01-04 DIAGNOSIS — N189 Chronic kidney disease, unspecified: Secondary | ICD-10-CM | POA: Diagnosis not present

## 2020-01-04 DIAGNOSIS — C50212 Malignant neoplasm of upper-inner quadrant of left female breast: Secondary | ICD-10-CM | POA: Insufficient documentation

## 2020-01-04 DIAGNOSIS — Z9221 Personal history of antineoplastic chemotherapy: Secondary | ICD-10-CM | POA: Insufficient documentation

## 2020-01-04 DIAGNOSIS — D631 Anemia in chronic kidney disease: Secondary | ICD-10-CM | POA: Diagnosis not present

## 2020-01-04 DIAGNOSIS — N898 Other specified noninflammatory disorders of vagina: Secondary | ICD-10-CM | POA: Insufficient documentation

## 2020-01-04 DIAGNOSIS — Z87891 Personal history of nicotine dependence: Secondary | ICD-10-CM | POA: Diagnosis not present

## 2020-01-04 DIAGNOSIS — E119 Type 2 diabetes mellitus without complications: Secondary | ICD-10-CM | POA: Diagnosis not present

## 2020-01-04 DIAGNOSIS — Z6841 Body Mass Index (BMI) 40.0 and over, adult: Secondary | ICD-10-CM

## 2020-01-04 DIAGNOSIS — Z79811 Long term (current) use of aromatase inhibitors: Secondary | ICD-10-CM | POA: Insufficient documentation

## 2020-01-04 DIAGNOSIS — Z170 Estrogen receptor positive status [ER+]: Secondary | ICD-10-CM | POA: Insufficient documentation

## 2020-01-04 DIAGNOSIS — N183 Chronic kidney disease, stage 3 unspecified: Secondary | ICD-10-CM

## 2020-01-04 DIAGNOSIS — D508 Other iron deficiency anemias: Secondary | ICD-10-CM | POA: Diagnosis not present

## 2020-01-04 DIAGNOSIS — Z803 Family history of malignant neoplasm of breast: Secondary | ICD-10-CM | POA: Insufficient documentation

## 2020-01-04 DIAGNOSIS — E1122 Type 2 diabetes mellitus with diabetic chronic kidney disease: Secondary | ICD-10-CM

## 2020-01-04 DIAGNOSIS — Z923 Personal history of irradiation: Secondary | ICD-10-CM | POA: Diagnosis not present

## 2020-01-04 DIAGNOSIS — I10 Essential (primary) hypertension: Secondary | ICD-10-CM

## 2020-01-04 LAB — CBC WITH DIFFERENTIAL (CANCER CENTER ONLY)
Abs Immature Granulocytes: 0.01 10*3/uL (ref 0.00–0.07)
Basophils Absolute: 0 10*3/uL (ref 0.0–0.1)
Basophils Relative: 0 %
Eosinophils Absolute: 0.1 10*3/uL (ref 0.0–0.5)
Eosinophils Relative: 1 %
HCT: 30 % — ABNORMAL LOW (ref 36.0–46.0)
Hemoglobin: 9.7 g/dL — ABNORMAL LOW (ref 12.0–15.0)
Immature Granulocytes: 0 %
Lymphocytes Relative: 26 %
Lymphs Abs: 1.4 10*3/uL (ref 0.7–4.0)
MCH: 29.8 pg (ref 26.0–34.0)
MCHC: 32.3 g/dL (ref 30.0–36.0)
MCV: 92.3 fL (ref 80.0–100.0)
Monocytes Absolute: 0.5 10*3/uL (ref 0.1–1.0)
Monocytes Relative: 9 %
Neutro Abs: 3.5 10*3/uL (ref 1.7–7.7)
Neutrophils Relative %: 64 %
Platelet Count: 173 10*3/uL (ref 150–400)
RBC: 3.25 MIL/uL — ABNORMAL LOW (ref 3.87–5.11)
RDW: 13.9 % (ref 11.5–15.5)
WBC Count: 5.4 10*3/uL (ref 4.0–10.5)
nRBC: 0 % (ref 0.0–0.2)

## 2020-01-04 LAB — CMP (CANCER CENTER ONLY)
ALT: 23 U/L (ref 0–44)
AST: 18 U/L (ref 15–41)
Albumin: 3.3 g/dL — ABNORMAL LOW (ref 3.5–5.0)
Alkaline Phosphatase: 114 U/L (ref 38–126)
Anion gap: 5 (ref 5–15)
BUN: 50 mg/dL — ABNORMAL HIGH (ref 6–20)
CO2: 22 mmol/L (ref 22–32)
Calcium: 9 mg/dL (ref 8.9–10.3)
Chloride: 112 mmol/L — ABNORMAL HIGH (ref 98–111)
Creatinine: 2.56 mg/dL — ABNORMAL HIGH (ref 0.44–1.00)
GFR, Est AFR Am: 24 mL/min — ABNORMAL LOW (ref >60)
GFR, Estimated: 21 mL/min — ABNORMAL LOW (ref >60)
Glucose, Bld: 265 mg/dL — ABNORMAL HIGH (ref 70–99)
Potassium: 5.4 mmol/L — ABNORMAL HIGH (ref 3.5–5.1)
Sodium: 139 mmol/L (ref 135–145)
Total Bilirubin: <0.2 mg/dL — ABNORMAL LOW (ref 0.3–1.2)
Total Protein: 7.5 g/dL (ref 6.5–8.1)

## 2020-01-04 MED ORDER — TAMOXIFEN CITRATE 20 MG PO TABS: 20.0000 mg | ORAL_TABLET | Freq: Every day | ORAL | 4 refills | Status: DC

## 2020-01-04 NOTE — Telephone Encounter (Signed)
I talk with patient regarding schedule  

## 2020-01-13 DIAGNOSIS — N184 Chronic kidney disease, stage 4 (severe): Secondary | ICD-10-CM | POA: Diagnosis not present

## 2020-01-16 ENCOUNTER — Ambulatory Visit: Payer: BC Managed Care – PPO | Attending: Internal Medicine

## 2020-01-16 ENCOUNTER — Other Ambulatory Visit: Payer: Self-pay

## 2020-01-16 DIAGNOSIS — Z23 Encounter for immunization: Secondary | ICD-10-CM | POA: Insufficient documentation

## 2020-01-16 NOTE — Progress Notes (Signed)
   Covid-19 Vaccination Clinic  Name:  Abigail Yoder    MRN: [id number] DOB: [birth year]  01/16/2020  [name] was observed post Covid-19 immunization for 15 minutes without incidence. She was provided with Vaccine Information Sheet and instruction to access the V-Safe system.   [name] was instructed to call 911 with any severe reactions post vaccine: Marland Kitchen Difficulty breathing  . Swelling of your face and throat  . A fast heartbeat  . A bad rash all over your body  . Dizziness and weakness    Immunizations Administered    Name Date Dose VIS Date Route   Pfizer COVID-19 Vaccine 01/16/2020 10:05 AM 0.3 mL 10/30/2019 Intramuscular   Manufacturer: [place]   Lot: [id number]   [place]: [id number]

## 2020-01-20 DIAGNOSIS — I129 Hypertensive chronic kidney disease with stage 1 through stage 4 chronic kidney disease, or unspecified chronic kidney disease: Secondary | ICD-10-CM | POA: Diagnosis not present

## 2020-01-27 DIAGNOSIS — N184 Chronic kidney disease, stage 4 (severe): Secondary | ICD-10-CM | POA: Diagnosis not present

## 2020-02-01 DIAGNOSIS — R809 Proteinuria, unspecified: Secondary | ICD-10-CM | POA: Diagnosis not present

## 2020-02-01 DIAGNOSIS — D631 Anemia in chronic kidney disease: Secondary | ICD-10-CM | POA: Diagnosis not present

## 2020-02-01 DIAGNOSIS — N184 Chronic kidney disease, stage 4 (severe): Secondary | ICD-10-CM | POA: Diagnosis not present

## 2020-02-01 DIAGNOSIS — I129 Hypertensive chronic kidney disease with stage 1 through stage 4 chronic kidney disease, or unspecified chronic kidney disease: Secondary | ICD-10-CM | POA: Diagnosis not present

## 2020-02-06 ENCOUNTER — Ambulatory Visit: Payer: BC Managed Care – PPO | Attending: Internal Medicine

## 2020-02-06 DIAGNOSIS — Z23 Encounter for immunization: Secondary | ICD-10-CM

## 2020-02-06 NOTE — Progress Notes (Signed)
   Covid-19 Vaccination Clinic  Name:  Abigail Yoder    MRN: [id number] DOB: [birth year]  02/06/2020  [name] was observed post Covid-19 immunization for 15 minutes without incident. She was provided with Vaccine Information Sheet and instruction to access the V-Safe system.   [name] was instructed to call 911 with any severe reactions post vaccine: Marland Kitchen Difficulty breathing  . Swelling of face and throat  . A fast heartbeat  . A bad rash all over body  . Dizziness and weakness   Immunizations Administered    Name Date Dose VIS Date Route   Pfizer COVID-19 Vaccine 02/06/2020 10:16 AM 0.3 mL 10/30/2019 Intramuscular   Manufacturer: [place]   Lot: [id number]   [place]: [id number]

## 2020-02-10 ENCOUNTER — Ambulatory Visit: Payer: BC Managed Care – PPO

## 2020-02-10 DIAGNOSIS — E1129 Type 2 diabetes mellitus with other diabetic kidney complication: Secondary | ICD-10-CM | POA: Diagnosis not present

## 2020-02-12 DIAGNOSIS — I10 Essential (primary) hypertension: Secondary | ICD-10-CM | POA: Diagnosis not present

## 2020-02-12 DIAGNOSIS — E119 Type 2 diabetes mellitus without complications: Secondary | ICD-10-CM | POA: Diagnosis not present

## 2020-02-12 DIAGNOSIS — E7800 Pure hypercholesterolemia, unspecified: Secondary | ICD-10-CM | POA: Diagnosis not present

## 2020-02-12 DIAGNOSIS — E1129 Type 2 diabetes mellitus with other diabetic kidney complication: Secondary | ICD-10-CM | POA: Diagnosis not present

## 2020-03-11 DIAGNOSIS — N184 Chronic kidney disease, stage 4 (severe): Secondary | ICD-10-CM | POA: Diagnosis not present

## 2020-03-11 DIAGNOSIS — N390 Urinary tract infection, site not specified: Secondary | ICD-10-CM | POA: Diagnosis not present

## 2020-03-11 DIAGNOSIS — I10 Essential (primary) hypertension: Secondary | ICD-10-CM | POA: Diagnosis not present

## 2020-03-18 ENCOUNTER — Emergency Department (HOSPITAL_COMMUNITY)
Admission: EM | Admit: 2020-03-18 | Discharge: 2020-03-18 | Disposition: A | Discharge status: Home / Self Care | Payer: BC Managed Care – PPO | Attending: Emergency Medicine | Admitting: Emergency Medicine

## 2020-03-18 ENCOUNTER — Other Ambulatory Visit: Payer: Self-pay

## 2020-03-18 ENCOUNTER — Encounter (HOSPITAL_COMMUNITY): Payer: Self-pay | Admitting: *Deleted

## 2020-03-18 DIAGNOSIS — N183 Chronic kidney disease, stage 3 unspecified: Secondary | ICD-10-CM | POA: Diagnosis not present

## 2020-03-18 DIAGNOSIS — R4182 Altered mental status, unspecified: Secondary | ICD-10-CM | POA: Diagnosis not present

## 2020-03-18 DIAGNOSIS — Z87891 Personal history of nicotine dependence: Secondary | ICD-10-CM | POA: Diagnosis not present

## 2020-03-18 DIAGNOSIS — E11649 Type 2 diabetes mellitus with hypoglycemia without coma: Secondary | ICD-10-CM | POA: Diagnosis not present

## 2020-03-18 DIAGNOSIS — I129 Hypertensive chronic kidney disease with stage 1 through stage 4 chronic kidney disease, or unspecified chronic kidney disease: Secondary | ICD-10-CM | POA: Insufficient documentation

## 2020-03-18 DIAGNOSIS — R404 Transient alteration of awareness: Secondary | ICD-10-CM | POA: Diagnosis not present

## 2020-03-18 DIAGNOSIS — E1165 Type 2 diabetes mellitus with hyperglycemia: Secondary | ICD-10-CM | POA: Diagnosis not present

## 2020-03-18 DIAGNOSIS — I10 Essential (primary) hypertension: Secondary | ICD-10-CM | POA: Diagnosis not present

## 2020-03-18 DIAGNOSIS — Z041 Encounter for examination and observation following transport accident: Secondary | ICD-10-CM | POA: Diagnosis not present

## 2020-03-18 DIAGNOSIS — E161 Other hypoglycemia: Secondary | ICD-10-CM | POA: Diagnosis not present

## 2020-03-18 DIAGNOSIS — E162 Hypoglycemia, unspecified: Secondary | ICD-10-CM | POA: Diagnosis not present

## 2020-03-18 LAB — CBG MONITORING, ED
Glucose-Capillary: 18 mg/dL — CL (ref 70–99)
Glucose-Capillary: 69 mg/dL — ABNORMAL LOW (ref 70–99)
Glucose-Capillary: 74 mg/dL (ref 70–99)

## 2020-03-18 MED ORDER — DEXTROSE 50 % IV SOLN
INTRAVENOUS | Status: AC
  Filled 2020-03-18: qty 50
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
## 2020-03-18 NOTE — ED Notes (Signed)
Daughter [place] called at patient request [phone] and update given

## 2020-03-28 ENCOUNTER — Other Ambulatory Visit: Payer: Self-pay

## 2020-03-28 ENCOUNTER — Ambulatory Visit: Payer: BC Managed Care – PPO | Admitting: Neurology

## 2020-03-28 ENCOUNTER — Encounter: Payer: Self-pay | Admitting: Neurology

## 2020-03-28 VITALS — BP 177/82 | HR 87 | Temp 97.5°F | Ht 63.0 in | Wt 259.3 lb

## 2020-03-28 DIAGNOSIS — R030 Elevated blood-pressure reading, without diagnosis of hypertension: Secondary | ICD-10-CM

## 2020-03-28 DIAGNOSIS — G478 Other sleep disorders: Secondary | ICD-10-CM

## 2020-03-28 DIAGNOSIS — R0683 Snoring: Secondary | ICD-10-CM | POA: Diagnosis not present

## 2020-03-28 DIAGNOSIS — Z6841 Body Mass Index (BMI) 40.0 and over, adult: Secondary | ICD-10-CM

## 2020-03-28 DIAGNOSIS — R351 Nocturia: Secondary | ICD-10-CM

## 2020-03-28 NOTE — Progress Notes (Signed)
Subjective:    Patient ID: Abigail Yoder is a 54 y.o. female.  HPI     Star Age, MD, PhD [facility] [address], [address]O. [place], [place] [address]  Dear Dr. Dimas Aguas,   I saw your patient, Abigail Yoder, upon your kind request, in my sleep clinic today For initial consultation of her sleep disorder, in particular, concern for underlying obstructive sleep apnea.  The patient is unaccompanied today.  As you know, Abigail Yoder is a 54 year old right-handed woman with an underlying medical history of hypertension, anemia, diabetes, hyperlipidemia, kidney disease, breast cancer with status post lumpectomy and chemotherapy, on tamoxifen, and morbid obesity with a BMI of over 45, who reports snoring and has been noted to have difficult to control blood pressure despite taking 3 medications.  Her Epworth sleepiness score is 3 out of 24, fatigue severity score is 16 out of 63.  I reviewed your office note from 03/11/2020, which you kindly included.  She has nocturia about 2-3 times per average night, denies recurrent morning headaches, is not aware of any family history of OSA.  She has had some weight gain in the recent months.  Bedtime is around 11 and rise time typically before 530, she typically works from 7 AM to 4:30 PM or even 5 PM.  She works in the [employer].  She lives with her husband and 28 year old son.  She also has a 43 year old daughter and 2 grandchildren from her.  Patient does not drink any caffeine on a daily basis, she quit smoking many years ago and drinks alcohol rarely, on special occasions.  She has no pets in the household, they do have a TV on in their bedroom at night.  Her Past Medical History Is Significant For: Past Medical History:  Diagnosis Date  . Anemia   . Diabetes mellitus   . Family history of breast cancer   . History of radiation therapy 06/10/17- 07/08/17   Left Breast 2.67 Gy in 15 fractions. Left Breast boost  2 Gy in 5 fractions  . Hypertension   . Obesity     Her Past Surgical History Is Significant For: Past Surgical History:  Procedure Laterality Date  . APPENDECTOMY    . BREAST LUMPECTOMY WITH RADIOACTIVE SEED AND SENTINEL LYMPH NODE BIOPSY Left 12/31/2016   Procedure: BREAST LUMPECTOMY WITH RADIOACTIVE SEED AND SENTINEL LYMPH NODE BIOPSY;  Surgeon: Alphonsa Overall, MD;  Location: [facility];  Service: General;  Laterality: Left;  . CERVICAL SPINE SURGERY    . CESAREAN SECTION    . IR REMOVAL TUN ACCESS W/ PORT W/O FL MOD SED  10/18/2017  . KNEE SURGERY Left   . PORTACATH PLACEMENT N/A 12/31/2016   Procedure: INSERTION PORT-A-CATH WITH [place];  Surgeon: Alphonsa Overall, MD;  Location: [facility];  Service: General;  Laterality: N/A;  . TUBAL LIGATION      Her Family History Is Significant For: Family History  Problem Relation Age of Onset  . Diabetes Mother   . Arthritis Mother   . Hypertension Father   . Heart disease Father   . Hyperlipidemia Father   . Breast cancer Sister 71       came back 17 years later  . Stroke Maternal Uncle   . Stroke Maternal Grandmother     Her Social History Is Significant For: Social History   Socioeconomic History  . Marital status: Married    Spouse name: Not on file  .  Number of children: 2  . Years of education: Not on file  . Highest education level: Not on file  Occupational History  . Not on file  Tobacco Use  . Smoking status: Former Smoker    Quit date: 11/19/1994    Years since quitting: 25.3  . Smokeless tobacco: Never Used  Substance and Sexual Activity  . Alcohol use: No  . Drug use: No  . Sexual activity: Yes    Birth control/protection: Surgical  Other Topics Concern  . Not on file  Social History Narrative  . Not on file   Social Determinants of Health   Financial Resource Strain:   . Difficulty of Paying Living Expenses:   Food Insecurity:   . Worried About Charity fundraiser in the Last  Year:   . Arboriculturist in the Last Year:   Transportation Needs:   . Film/video editor (Medical):   Marland Kitchen Lack of Transportation (Non-Medical):   Physical Activity:   . Days of Exercise per Week:   . Minutes of Exercise per Session:   Stress:   . Feeling of Stress :   Social Connections:   . Frequency of Communication with Friends and Family:   . Frequency of Social Gatherings with Friends and Family:   . Attends Religious Services:   . Active Member of Clubs or Organizations:   . Attends Archivist Meetings:   Marland Kitchen Marital Status:     Her Allergies Are:  Allergies  Allergen Reactions  . Pineapple Anaphylaxis and Hives  :   Her Current Medications Are:  Outpatient Encounter Medications as of 03/28/2020  Medication Sig  . amLODipine (NORVASC) 10 MG tablet Take 1 tablet (10 mg total) by mouth daily.  . carvedilol (COREG) 25 MG tablet Take 25 mg by mouth 2 (two) times daily.  . cholecalciferol (VITAMIN D3) 25 MCG (1000 UNIT) tablet Take 2,000 Units by mouth daily.   . hydrALAZINE (APRESOLINE) 25 MG tablet Take 25 mg by mouth 3 (three) times daily.  Marland Kitchen NOVOLOG MIX 70/30 FLEXPEN (70-30) 100 UNIT/ML FlexPen Inject 50 Units into the skin 2 (two) times daily.  . sodium bicarbonate 650 MG tablet Take 650 mg by mouth 3 (three) times daily.  . tamoxifen (NOLVADEX) 20 MG tablet Take 1 tablet (20 mg total) by mouth daily.  . [DISCONTINUED] magnesium oxide (MAG-OX) 400 (241.3 Mg) MG tablet Take 1 tablet (400 mg total) by mouth daily.  . [DISCONTINUED] prochlorperazine (COMPAZINE) 10 MG tablet Take 1 tablet (10 mg total) by mouth every 6 (six) hours as needed (Nausea or vomiting).   No facility-administered encounter medications on file as of 03/28/2020.  :  Review of Systems:  Out of a complete 14 point review of systems, all are reviewed and negative with the exception of these symptoms as listed below:  Review of Systems  Neurological:       Here for sleep consult. No prior  sleep study and snoring is present.  Epworth Sleepiness Scale 0= would never doze 1= slight chance of dozing 2= moderate chance of dozing 3= high chance of dozing  Sitting and reading:1 Watching TV:2 Sitting inactive in a public place (ex. Theater or meeting):0 As a passenger in a car for an hour without a break:0 Lying down to rest in the afternoon:0 Sitting and talking to someone:0 Sitting quietly after lunch (no alcohol):0 In a car, while stopped in traffic:0 Total:3     Objective:  Neurological Exam  Physical  Exam Physical Examination:   Vitals:   03/28/20 0749  BP: (!) 177/82  Pulse: 87  Temp: (!) 97.5 F (36.4 C)    General Examination: The patient is a very pleasant 54 y.o. female in no acute distress. She appears well-developed and well-nourished and well groomed.   HEENT: Normocephalic, atraumatic, pupils are equal, round and reactive to light, extraocular tracking is good without limitation to gaze excursion or nystagmus noted. Hearing is grossly intact. Face is symmetric with normal facial animation. Speech is clear with no dysarthria noted. There is no hypophonia. There is no lip, neck/head, jaw or voice tremor. Neck is supple with full range of passive and active motion. There are no carotid bruits on auscultation. Oropharynx exam reveals: mild mouth dryness, adequate dental hygiene and mild airway crowding, due to longer uvula, tonsils are small, Mallampati class II.  Neck circumference is 17-1/4 inches, she has a mild overbite.  Tongue protrudes centrally in palate elevates symmetrically.  Chest: Clear to auscultation without wheezing, rhonchi or crackles noted.  Heart: S1+S2+0, regular and normal without murmurs, rubs or gallops noted.   Abdomen: Soft, non-tender and non-distended with normal bowel sounds appreciated on auscultation.  Extremities: There is 2+ pitting edema in the ankles bilaterally, 1+ edema in the distal lower extremities bilaterally.      Skin: Warm and dry without trophic changes noted.   Musculoskeletal: exam reveals no obvious joint deformities, tenderness or joint swelling or erythema.   Neurologically:  Mental status: The patient is awake, alert and oriented in all 4 spheres. Her immediate and remote memory, attention, language skills and fund of knowledge are appropriate. There is no evidence of aphasia, agnosia, apraxia or anomia. Speech is clear with normal prosody and enunciation. Thought process is linear. Mood is normal and affect is normal.  Cranial nerves II - XII are as described above under HEENT exam.  Motor exam: Normal bulk, strength and tone is noted. There is no tremor, Romberg shows mild sway initially. Fine motor skills and coordination: grossly intact.  Cerebellar testing: No dysmetria or intention tremor. There is no truncal or gait ataxia.  Sensory exam: intact to light touch in the upper and lower extremities.  Gait, station and balance: She stands easily. No veering to one side is noted. No leaning to one side is noted. Posture is age-appropriate and stance is narrow based. Gait shows normal stride length and normal pace. No problems turning are noted. Tandem walk is difficult for her.                 Assessment and Plan:   In summary, Abigail Yoder is a very pleasant 54 y.o.-year old female with an underlying medical history of hypertension, anemia, diabetes, hyperlipidemia, kidney disease, breast cancer with status post lumpectomy and chemotherapy, on tamoxifen, and morbid obesity with a BMI of over 60, whose history and physical exam are  concerning for obstructive sleep apnea (OSA). I had a long chat with the patient about my findings and the diagnosis of OS, its prognosis and treatment options. We talked about medical treatments, surgical interventions and non-pharmacological approaches. I explained in particular the risks and ramifications of untreated moderate to severe OSA, especially with  respect to developing cardiovascular disease down the Road, including congestive heart failure, difficult to treat hypertension, cardiac arrhythmias, or stroke. Even type 2 diabetes has, in part, been linked to untreated OSA. Symptoms of untreated OSA include daytime sleepiness, memory problems, mood irritability and mood disorder such as  depression and anxiety, lack of energy, as well as recurrent headaches, especially morning headaches. We talked about trying to maintain a healthy lifestyle in general, as well as the importance of weight control. We also talked about the importance of good sleep hygiene. I recommended the following at this time: sleep study.  I explained the sleep test procedure to the patient and also outlined possible surgical and non-surgical treatment options of OSA, including the use of a custom-made dental device (which would require a referral to a specialist dentist or oral surgeon), upper airway surgical options, such as traditional UPPP or a novel less invasive surgical option in the form of Inspire hypoglossal nerve stimulation (which would involve a referral to an ENT surgeon). I also explained the CPAP treatment option to the patient, who indicated that she would be willing to try CPAP if the need arises. I explained the importance of being compliant with PAP treatment, not only for insurance purposes but primarily to improve Her symptoms, and for the patient's long term health benefit, including to reduce Her cardiovascular risks. I answered all her questions today and the patient was in agreement. I plan to see her back after the sleep study is completed and encouraged her to call with any interim questions, concerns, problems or updates.   Thank you very much for allowing me to participate in the care of this nice patient. If I can be of any further assistance to you please do not hesitate to call me at [phone].  Sincerely,   Star Age, MD, PhD

## 2020-03-28 NOTE — Patient Instructions (Signed)

## 2020-04-11 DIAGNOSIS — E11649 Type 2 diabetes mellitus with hypoglycemia without coma: Secondary | ICD-10-CM | POA: Diagnosis not present

## 2020-04-15 DIAGNOSIS — M1240 Intermittent hydrarthrosis, unspecified site: Secondary | ICD-10-CM | POA: Diagnosis not present

## 2020-04-15 DIAGNOSIS — I10 Essential (primary) hypertension: Secondary | ICD-10-CM | POA: Diagnosis not present

## 2020-04-15 DIAGNOSIS — R809 Proteinuria, unspecified: Secondary | ICD-10-CM | POA: Diagnosis not present

## 2020-04-29 DIAGNOSIS — I10 Essential (primary) hypertension: Secondary | ICD-10-CM | POA: Diagnosis not present

## 2020-04-29 DIAGNOSIS — N184 Chronic kidney disease, stage 4 (severe): Secondary | ICD-10-CM | POA: Diagnosis not present

## 2020-04-29 DIAGNOSIS — N390 Urinary tract infection, site not specified: Secondary | ICD-10-CM | POA: Diagnosis not present

## 2020-05-02 ENCOUNTER — Other Ambulatory Visit: Payer: Self-pay

## 2020-05-02 ENCOUNTER — Ambulatory Visit: Payer: BC Managed Care – PPO | Admitting: Neurology

## 2020-05-04 ENCOUNTER — Ambulatory Visit (INDEPENDENT_AMBULATORY_CARE_PROVIDER_SITE_OTHER): Payer: BC Managed Care – PPO | Admitting: Neurology

## 2020-05-04 DIAGNOSIS — G4733 Obstructive sleep apnea (adult) (pediatric): Secondary | ICD-10-CM

## 2020-05-04 DIAGNOSIS — R030 Elevated blood-pressure reading, without diagnosis of hypertension: Secondary | ICD-10-CM

## 2020-05-04 DIAGNOSIS — G478 Other sleep disorders: Secondary | ICD-10-CM

## 2020-05-04 DIAGNOSIS — R0683 Snoring: Secondary | ICD-10-CM

## 2020-05-04 DIAGNOSIS — R351 Nocturia: Secondary | ICD-10-CM

## 2020-05-11 DIAGNOSIS — E7800 Pure hypercholesterolemia, unspecified: Secondary | ICD-10-CM | POA: Diagnosis not present

## 2020-05-11 DIAGNOSIS — D509 Iron deficiency anemia, unspecified: Secondary | ICD-10-CM | POA: Diagnosis not present

## 2020-05-11 DIAGNOSIS — E1165 Type 2 diabetes mellitus with hyperglycemia: Secondary | ICD-10-CM | POA: Diagnosis not present

## 2020-05-18 DIAGNOSIS — E7800 Pure hypercholesterolemia, unspecified: Secondary | ICD-10-CM | POA: Diagnosis not present

## 2020-05-18 DIAGNOSIS — R5383 Other fatigue: Secondary | ICD-10-CM | POA: Diagnosis not present

## 2020-05-18 DIAGNOSIS — Z0000 Encounter for general adult medical examination without abnormal findings: Secondary | ICD-10-CM | POA: Diagnosis not present

## 2020-05-18 DIAGNOSIS — E875 Hyperkalemia: Secondary | ICD-10-CM | POA: Diagnosis not present

## 2020-05-18 DIAGNOSIS — D509 Iron deficiency anemia, unspecified: Secondary | ICD-10-CM | POA: Diagnosis not present

## 2020-05-18 DIAGNOSIS — E1169 Type 2 diabetes mellitus with other specified complication: Secondary | ICD-10-CM | POA: Diagnosis not present

## 2020-05-18 DIAGNOSIS — N183 Chronic kidney disease, stage 3 unspecified: Secondary | ICD-10-CM | POA: Diagnosis not present

## 2020-05-25 ENCOUNTER — Telehealth: Payer: Self-pay

## 2020-05-25 NOTE — Telephone Encounter (Signed)
-----   Message from Star Age, MD sent at 05/25/2020  7:35 AM EDT ----- Patient referred by Dr. Dimas Aguas, seen by me on 03/28/20, HST on 05/04/20.    Please call and notify the patient that the recent home sleep test showed obstructive sleep apnea in the moderate range. While I recommend treatment for this in the form CPAP, her insurance will not approve a sleep study for this. They will likely only approve a trial of autoPAP, which means, that we don't have to bring her in for a sleep study with CPAP, but will let her try an autoPAP machine at home, through a DME company (of her choice, or as per insurance requirement). The DME representative will educate her on how to use the machine, how to put the mask on, etc. I have placed an order in the chart. Please send referral, talk to patient, send report to referring MD. We will need a FU in sleep clinic for 10 weeks post-PAP set up, please arrange that with me or one of our NPs. Thanks,   Star Age, MD, PhD [facility])

## 2020-05-25 NOTE — Progress Notes (Signed)
Patient referred by Dr. Dimas Aguas, seen by me on 03/28/20, HST on 05/04/20.    Please call and notify the patient that the recent home sleep test showed obstructive sleep apnea in the moderate range. While I recommend treatment for this in the form CPAP, her insurance will not approve a sleep study for this. They will likely only approve a trial of autoPAP, which means, that we don't have to bring her in for a sleep study with CPAP, but will let her try an autoPAP machine at home, through a DME company (of her choice, or as per insurance requirement). The DME representative will educate her on how to use the machine, how to put the mask on, etc. I have placed an order in the chart. Please send referral, talk to patient, send report to referring MD. We will need a FU in sleep clinic for 10 weeks post-PAP set up, please arrange that with me or one of our NPs. Thanks,   Star Age, MD, PhD [facility])

## 2020-05-25 NOTE — Telephone Encounter (Signed)
I called pt. I advised pt that Dr. Rexene Alberts reviewed their sleep study results and found that pt had moderate osa. Dr. Rexene Alberts recommends that pt start an autopap at home for treatment. I reviewed PAP compliance expectations with the pt. Pt is agreeable to starting an auto-PAP. I advised pt that an order will be sent to a DME, [facility], and [facility] will call the pt within about one week after they file with the pt's insurance. [facility] will show the pt how to use the machine, fit for masks, and troubleshoot the auto-PAP if needed. A follow up appt was made for insurance purposes with Dr. Rexene Alberts on 08/02/2020 at 200. Pt verbalized understanding to arrive 15 minutes early and bring their auto-PAP. A letter with all of this information in it will be mailed to the pt as a reminder. I verified with the pt that the address we have on file is correct. Pt verbalized understanding of results. Pt had no questions at this time but was encouraged to call back if questions arise. I have sent the order to [facility] and have received confirmation that they have received the order.

## 2020-05-25 NOTE — Procedures (Signed)
Patient Information     First Name: Abigail Last Name: Yoder ID: [id number]  Birth Date: June 20, 1966 Age: 54 y.o Gender: Female  Referring Provider: Dr. Dimas Aguas BMI: 46.1 (W=260 lb, H=5' 3'')  Neck Circ.:         17.25  Epworth:  3/24   Sleep Study Information    Study Date: 05/04/20 S/H/A Version: [id number] / 4.1.1528 / 57  History:    54 year old woman with a history of hypertension, anemia, diabetes, hyperlipidemia, kidney disease, breast cancer with status post lumpectomy and chemotherapy, on tamoxifen, and morbid obesity with a BMI of over 45, who reports snoring and has been noted to have difficult to control blood pressure despite taking 3 medications.  Summary & Diagnosis:      OSA Recommendations:       This home sleep test demonstrates moderate obstructive sleep apnea with a total AHI of 19.2/hour and O2 nadir of 83%. Given the patient's medical history and sleep related complaints, treatment with positive airway pressure (in the form of CPAP) is recommended. This will require a full night CPAP titration study for proper treatment settings, O2 monitoring and mask fitting. Based on the severity of the sleep disordered breathing an attended titration study is indicated. However, patient's insurance has denied an attended sleep study; therefore, the patient will be advised to proceed with an autoPAP titration/trial at home for now. Please note that untreated obstructive sleep apnea may carry additional perioperative morbidity. Patients with significant obstructive sleep apnea should receive perioperative PAP therapy and the surgeons and particularly the anesthesiologist should be informed of the diagnosis and the severity of the sleep disordered breathing. The patient should be cautioned not to drive, work at heights, or operate dangerous or heavy equipment when tired or sleepy. Review and reiteration of good sleep hygiene measures should be pursued with any patient. Other causes of the  patient's symptoms, including circadian rhythm disturbances, an underlying mood disorder, medication effect and/or an underlying medical problem cannot be ruled out based on this test. Clinical correlation is recommended. The patient and his referring provider will be notified of the test results. The patient will be seen in follow up in sleep clinic at [facility].  I certify that I have reviewed the raw data recording prior to the issuance of this report in accordance with the standards of the American Academy of Sleep Medicine (AASM).  Star Age, MD, PhD [facility]) Diplomat, ABPN (Neurology and Sleep)           Sleep Summary  Oxygen Saturation Statistics   Start Study Time: End Study Time: Total Recording Time:  7:39:34 PM 5:31:03 AM   9 h, 51 min  Total Sleep Time % REM of Sleep Time:  7 h, 3 min  12.8    Mean: 92 Minimum: 83 Maximum: 97  Mean of Desaturations Nadirs (%):   89  Oxygen Desaturation. %:   4-9 10-20 >20 Total  Events Number Total    32  1 97.0 3.0  0 0.0  33 100.0  Oxygen Saturation: <90 <=88 <85 <80 <70  Duration (minutes): Sleep % 3.1 0.7  2.1 0.1  0.5 0.0 0.0 0.0 0.0 0.0     Respiratory Indices      Total Events REM NREM All Night  pRDI:  86  pAHI:  85 ODI:  33  pAHIc:  0  % CSR: 0.0 82.8 82.8 45.5 0.0 15.8 15.5 5.3 0.0 19.4 19.2 7.5 0.0  Pulse Rate Statistics during Sleep (BPM)      Mean: 77 Minimum: 61 Maximum: 153    Indices are calculated using technically valid sleep time of 4 h, 25 min. Central-Indices are calculated using technically valid sleep time of 4 h 1 min. pRDI/pAHI are calculated using oxi desaturations ? 3%  Body Position Statistics  Position Supine Prone Right Left Non-Supine  Sleep (min) 138.0 66.5 55.0 164.0 285.5  Sleep % 32.6 15.7 13.0 38.7 67.4  pRDI 21.6 14.6 10.9 22.7 18.5  pAHI 21.6 14.6 10.9 22.2 18.1  ODI 8.2 1.2 5.5 10.5 7.1     Snoring Statistics Snoring Level  (dB) >40 >50 >60 >70 >80 >Threshold (45)  Sleep (min) 307.2 7.8 1.7 0.0 0.0 56.0  Sleep % 72.5 1.8 0.4 0.0 0.0 13.2    Mean: 42 dB Sleep Stages Chart                             pAHI=19.2                                         Mild              Moderate                    Severe                                                 5              15                    30

## 2020-06-09 DIAGNOSIS — E781 Pure hyperglyceridemia: Secondary | ICD-10-CM | POA: Diagnosis not present

## 2020-06-09 DIAGNOSIS — E119 Type 2 diabetes mellitus without complications: Secondary | ICD-10-CM | POA: Diagnosis not present

## 2020-06-09 DIAGNOSIS — I10 Essential (primary) hypertension: Secondary | ICD-10-CM | POA: Diagnosis not present

## 2020-06-17 DIAGNOSIS — E7800 Pure hypercholesterolemia, unspecified: Secondary | ICD-10-CM | POA: Diagnosis not present

## 2020-06-17 DIAGNOSIS — I10 Essential (primary) hypertension: Secondary | ICD-10-CM | POA: Diagnosis not present

## 2020-06-17 DIAGNOSIS — E1129 Type 2 diabetes mellitus with other diabetic kidney complication: Secondary | ICD-10-CM | POA: Diagnosis not present

## 2020-07-04 ENCOUNTER — Telehealth: Payer: Self-pay

## 2020-07-04 NOTE — Telephone Encounter (Signed)
Message received from Arthur just letting you know I call this patient on 8/4 no answer and again on 8/10. when I started going over her financials with her she stopped me and said that she will not pay that amount. I offered her a payment plan and a refurb bundle package with payment plans and she declined that as well. I voided the order and she said that she was going to call her DR and see if there is something cheaper that she can do.

## 2020-08-02 ENCOUNTER — Ambulatory Visit: Payer: Self-pay | Admitting: Neurology

## 2020-09-01 DIAGNOSIS — E1129 Type 2 diabetes mellitus with other diabetic kidney complication: Secondary | ICD-10-CM | POA: Diagnosis not present

## 2020-09-09 DIAGNOSIS — N184 Chronic kidney disease, stage 4 (severe): Secondary | ICD-10-CM | POA: Diagnosis not present

## 2020-09-09 DIAGNOSIS — E213 Hyperparathyroidism, unspecified: Secondary | ICD-10-CM | POA: Diagnosis not present

## 2020-09-09 DIAGNOSIS — I10 Essential (primary) hypertension: Secondary | ICD-10-CM | POA: Diagnosis not present

## 2020-09-23 DIAGNOSIS — N184 Chronic kidney disease, stage 4 (severe): Secondary | ICD-10-CM | POA: Diagnosis not present

## 2020-09-23 DIAGNOSIS — I10 Essential (primary) hypertension: Secondary | ICD-10-CM | POA: Diagnosis not present

## 2020-09-23 DIAGNOSIS — N390 Urinary tract infection, site not specified: Secondary | ICD-10-CM | POA: Diagnosis not present

## 2020-12-06 DIAGNOSIS — R928 Other abnormal and inconclusive findings on diagnostic imaging of breast: Secondary | ICD-10-CM | POA: Diagnosis not present

## 2020-12-28 DIAGNOSIS — I10 Essential (primary) hypertension: Secondary | ICD-10-CM | POA: Diagnosis not present

## 2020-12-28 DIAGNOSIS — N184 Chronic kidney disease, stage 4 (severe): Secondary | ICD-10-CM | POA: Diagnosis not present

## 2021-01-02 NOTE — Progress Notes (Signed)
[place]  [phone] [phone]     ID: Abigail Yoder DOB: Mar 06, 1966  MR#: [id number]  ALP#:[id number]  Patient Care Team: [name], MD as PCP - General (Internal Medicine) Alphonsa Overall, MD as Consulting Physician (General Surgery) [name], Virgie Dad, MD as Consulting Physician (Oncology) Eppie Gibson, MD as Attending Physician (Radiation Oncology) Jacelyn Pi, MD as Consulting Physician (Endocrinology) Ardis Hughs, MD as Attending Physician (Urology) Delice Bison Charlestine Massed, NP as Nurse Practitioner (Hematology and Oncology) Andee Lineman, NP as Nurse Practitioner (Nephrology) Chauncey Cruel, MD OTHER MD:  CHIEF COMPLAINT: Estrogen receptor positive breast cancer  CURRENT TREATMENT: Tamoxifen   INTERVAL HISTORY: Abigail Yoder returns today for follow-up of her estrogen receptor positive breast cancer.   She continues on tamoxifen.  She has minimal hot flashes and no vaginal discharge or other side effects from this medication.  Since her last visit, she underwent bilateral diagnostic mammography with tomography at [facility] on 12/06/2020 showing: breast density category A; no evidence of malignancy in either breast.    REVIEW OF SYSTEMS: Abigail Yoder is not exercising regularly.  She gave up her gym membership when Covid hit.  She wonders why her left breast is a little bit thicker than the right.  She is having no unusual pains, sensitivities or tenderness.  She understands if she did develop those they would be due to her treatment and would not indicate recurrence of her disease.  Aside from that a detailed review of systems today was stable.   COVID 19 VACCINATION STATUS: [place] x2, most recently 01/2020   BREAST CANCER HISTORY: From the original intake note:  Abigail Yoder had bilateral routine screening mammography with tomography at [facility] 11/14/2016. The breast density was category B. In the left breast upper inner quadrant there was a  1.5 cm high density mass which was further evaluated with ultrasonography 11/16/2016. This confirmed a 1.2 cm lobulated mass in the left breast upper inner quadrant. The left axilla was benign.  Biopsy of the left breast mass in question 11/21/2016 showed (SAA 18-59) invasive ductal carcinoma, grade 3, estrogen and progesterone receptor positive, HER-2 nonamplified, with a signals ratio of 1.05 and the number per cell 2.15, with an MIB-1 of 90%.  Her subsequent history is as detailed below   PAST MEDICAL HISTORY: Past Medical History:  Diagnosis Date  . Anemia   . Diabetes mellitus   . Family history of breast cancer   . History of radiation therapy 06/10/17- 07/08/17   Left Breast 2.67 Gy in 15 fractions. Left Breast boost 2 Gy in 5 fractions  . Hypertension   . Obesity     PAST SURGICAL HISTORY: Past Surgical History:  Procedure Laterality Date  . APPENDECTOMY    . BREAST LUMPECTOMY WITH RADIOACTIVE SEED AND SENTINEL LYMPH NODE BIOPSY Left 12/31/2016   Procedure: BREAST LUMPECTOMY WITH RADIOACTIVE SEED AND SENTINEL LYMPH NODE BIOPSY;  Surgeon: Alphonsa Overall, MD;  Location: [place];  Service: General;  Laterality: Left;  . CERVICAL SPINE SURGERY    . CESAREAN SECTION    . IR REMOVAL TUN ACCESS W/ PORT W/O FL MOD SED  10/18/2017  . KNEE SURGERY Left   . PORTACATH PLACEMENT N/A 12/31/2016   Procedure: INSERTION PORT-A-CATH WITH [place];  Surgeon: Alphonsa Overall, MD;  Location: [place];  Service: General;  Laterality: N/A;  . TUBAL LIGATION      FAMILY HISTORY Family History  Problem Relation Age of Onset  . Diabetes Mother   .  Arthritis Mother   . Hypertension Father   . Heart disease Father   . Hyperlipidemia Father   . Breast cancer Sister 23       came back 17 years later  . Stroke Maternal Uncle   . Stroke Maternal Grandmother   The patient's father died at the age of 29 from a brain aneurysm. The patient's mother died at age 21 following total  hip replacement. The patient had 4 brothers, 5 sisters. One sister was diagnosed with breast cancer at age 80. There is no history of ovarian cancer in the family.   GYNECOLOGIC HISTORY:  No LMP recorded. (Menstrual status: Perimenopausal). Menarche age 14, first live birth age 72, the patient is Abigail Yoder. She is perimenopausal and her most recent period was October 2017. She never used oral contraceptives.   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 Her husband [name] works in a Proofreader. Daughter Abigail Yoder works for the [employer] in [place]. Son works for the [employer] at [employer] in [place]. The patient has 2 grandchildren. She attends a local [place] DIRECTIVES: In the absence of any documents to the contrary the patient's husband is her healthcare power of attorney   HEALTH MAINTENANCE: Social History   Tobacco Use  . Smoking status: Former Smoker    Quit date: 11/19/1994    Years since quitting: 26.1  . Smokeless tobacco: Never Used  Substance Use Topics  . Alcohol use: No  . Drug use: No     Colonoscopy: Never  PAP: 2015  Bone density: Never   Allergies  Allergen Reactions  . Pineapple Anaphylaxis and Hives    Current Outpatient Medications  Medication Sig Dispense Refill  . amLODipine (NORVASC) 10 MG tablet Take 1 tablet (10 mg total) by mouth daily.    . carvedilol (COREG) 25 MG tablet Take 25 mg by mouth 2 (two) times daily.    . cholecalciferol (VITAMIN D3) 25 MCG (1000 UNIT) tablet Take 2,000 Units by mouth daily.     . hydrALAZINE (APRESOLINE) 25 MG tablet Take 25 mg by mouth 3 (three) times daily.    Marland Kitchen NOVOLOG MIX 70/30 FLEXPEN (70-30) 100 UNIT/ML FlexPen Inject 50 Units into the skin 2 (two) times daily.    . sodium bicarbonate 650 MG tablet Take 650 mg by mouth 3 (three) times daily.    . tamoxifen (NOLVADEX)  20 MG tablet Take 1 tablet (20 mg total) by mouth daily. 90 tablet 4   No current facility-administered medications for this visit.    OBJECTIVE: African-American woman in no acute distress Vitals:   01/03/21 0937  BP: (!) 163/70  Pulse: 81  Resp: 18  Temp: (!) 97.5 F (36.4 C)  SpO2: 98%     Body mass index is 44.66 kg/m.    ECOG FS:2 - Symptomatic, <50% confined to bed [facility] Weights   01/03/21 0937  Weight: 252 lb 1.6 oz (114.4 kg)    Sclerae unicteric, EOMs intact Wearing a mask No cervical or supraclavicular adenopathy Lungs no rales or rhonchi Heart regular rate and rhythm Abd soft, obese, nontender, positive bowel sounds MSK no focal spinal tenderness, no upper extremity lymphedema Neuro: nonfocal, well oriented, appropriate affect Breasts: The right breast is benign.  The left wrist is status post lumpectomy and radiation.  The cosmetic result is good.  There is the expected skin  coarsening.  There is no evidence of local recurrence.  Both axillae are benign.   LAB RESULTS:  CMP     Component Value Date/Time   NA 139 01/04/2020 1038   NA 141 07/04/2017 0850   K 5.4 (H) 01/04/2020 1038   K 3.8 07/04/2017 0850   CL 112 (H) 01/04/2020 1038   CO2 22 01/04/2020 1038   CO2 26 07/04/2017 0850   GLUCOSE 265 (H) 01/04/2020 1038   GLUCOSE 125 07/04/2017 0850   GLUCOSE 333 (H) 09/05/2006 1306   BUN 50 (H) 01/04/2020 1038   BUN 15.3 07/04/2017 0850   CREATININE 2.56 (H) 01/04/2020 1038   CREATININE 1.0 07/04/2017 0850   CALCIUM 9.0 01/04/2020 1038   CALCIUM 9.4 07/04/2017 0850   PROT 7.5 01/04/2020 1038   PROT 7.1 07/04/2017 0850   ALBUMIN 3.3 (L) 01/04/2020 1038   ALBUMIN 2.6 (L) 07/04/2017 0850   AST 18 01/04/2020 1038   AST 18 07/04/2017 0850   ALT 23 01/04/2020 1038   ALT 10 07/04/2017 0850   ALKPHOS 114 01/04/2020 1038   ALKPHOS 86 07/04/2017 0850   BILITOT <0.2 (L) 01/04/2020 1038   BILITOT <0.22 07/04/2017 0850   GFRNONAA 21 (L) 01/04/2020 1038    GFRAA 24 (L) 01/04/2020 1038    INo results found for: SPEP, UPEP  Lab Results  Component Value Date   WBC 5.3 01/03/2021   NEUTROABS 3.5 01/03/2021   HGB 9.5 (L) 01/03/2021   HCT 29.8 (L) 01/03/2021   MCV 92.5 01/03/2021   PLT 167 01/03/2021      Chemistry      Component Value Date/Time   NA 139 01/04/2020 1038   NA 141 07/04/2017 0850   K 5.4 (H) 01/04/2020 1038   K 3.8 07/04/2017 0850   CL 112 (H) 01/04/2020 1038   CO2 22 01/04/2020 1038   CO2 26 07/04/2017 0850   BUN 50 (H) 01/04/2020 1038   BUN 15.3 07/04/2017 0850   CREATININE 2.56 (H) 01/04/2020 1038   CREATININE 1.0 07/04/2017 0850      Component Value Date/Time   CALCIUM 9.0 01/04/2020 1038   CALCIUM 9.4 07/04/2017 0850   ALKPHOS 114 01/04/2020 1038   ALKPHOS 86 07/04/2017 0850   AST 18 01/04/2020 1038   AST 18 07/04/2017 0850   ALT 23 01/04/2020 1038   ALT 10 07/04/2017 0850   BILITOT <0.2 (L) 01/04/2020 1038   BILITOT <0.22 07/04/2017 0850       No results found for: LABCA2  No components found for: LABCA125  No results for input(s): INR in the last 168 hours.  Urinalysis    Component Value Date/Time   COLORURINE STRAW (A) 03/13/2017 2011   APPEARANCEUR CLEAR 03/13/2017 2011   LABSPEC 1.011 03/13/2017 2011   LABSPEC 1.010 02/07/2017 1005   PHURINE 7.0 03/13/2017 2011   GLUCOSEU NEGATIVE 03/13/2017 2011   GLUCOSEU Negative 02/07/2017 1005   HGBUR NEGATIVE 03/13/2017 2011   BILIRUBINUR NEGATIVE 03/13/2017 2011   BILIRUBINUR Negative 02/07/2017 [place] 03/13/2017 2011   PROTEINUR 100 (A) 03/13/2017 2011   UROBILINOGEN 0.2 02/07/2017 1005   NITRITE NEGATIVE 03/13/2017 2011   LEUKOCYTESUR NEGATIVE 03/13/2017 2011   LEUKOCYTESUR Negative 02/07/2017 1005    STUDIES: No results found.   ELIGIBLE FOR AVAILABLE RESEARCH PROTOCOL: No   ASSESSMENT: 55 y.o. [place] woman status post left breast upper inner quadrant biopsy 11/21/2016 for a clinical T1c N0, stage IA  invasive ductal carcinoma, grade 3, estrogen and  progesterone receptor positive, HER-2 not amplified, with an MIB-1 of 90%  (1) Oncotype DX Score of 60 predicts a risk of recurrence outside the breast of 34% if the patient's only systemic therapy is tamoxifen for 5 years. On a similar database this risk drops to about 12% if chemotherapy is also given  (2) genetics testing sent 11/28/2016; The Breast/GYN gene panel offered by [facility] includes sequencing and rearrangement analysis for the following 23 genes:  ATM, BARD1, BRCA1, BRCA2, BRIP1, CDH1, CHEK2, EPCAM, FANCC, MLH1, MSH2, MSH6, MUTYH, NBN, NF1, PALB2, PMS2, POLD1, PTEN, RAD51C, RAD51D, RECQL, and TP53.   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    (3) status post left breast lumpectomy on 12/31/2016: invasive ductal carcinoma, 1.8cm, grade 3, margins negative, 1 SLN negative, ER+(80%), PR-(2%), Ki-67 90%, HER-2 negative (ratio 1.05).  T1cN0  (4) chemotherapy consisting of cyclophosphamide and doxorubicin in dose dense fashion 4 started 01/17/17, completed 03/07/2017,followed by Abraxane weekly 12.  (a)  cycle 3 AC delayed because of febrile neutropenia  (b) febrile neutropenia also occurred after cycle 4 AC  (c) Cycle 2 and 4 of Abraxane delayed because of intolerance  (d) Abraxane discontinued after 4 doses secondary to poor marrow response; last dose 05/16/2017  (5) adjuvant radiation 06/10/2017 - 07/08/2017 Site/dose: 1. Left Breast: 40.05 Gy in 15 fractions  2. Left Breast Boost: 10 Gy in 5 fractions   (6) tamoxifen started 08/09/2017  (7) anemia of renal disease  (8) morbid obesity  (a) baseline weight 2008 was 252 pounds   PLAN: Abigail Yoder is now 4 years out from definitive surgery for her breast cancer with no evidence of disease recurrence.  This is very favorable.  She is tolerating tamoxifen well and the plan is to  continue that to a total of 5 years which will mean through August 2023.  She is going to see [place] again next spring, after her mammogram in 2023 and that will likely be her "graduation" visit.  She will have a few extra months of tamoxifen trailing off after that.  She knows to call for any other issue that may develop before the next visit  Total encounter time 20 minutes.  Chauncey Cruel, MD   01/03/2021 10:00 AM Medical Oncology and Hematology [facility], [place] [address] Tel. [phone]    Fax. [phone]   I, Wilburn Mylar, am acting as scribe for Dr. Virgie Dad. Tata Timmins.  I, Lurline Del MD, have reviewed the above documentation for accuracy and completeness, and I agree with the above.   *Total Encounter Time as defined by the Centers for Medicare and Medicaid Services includes, in addition to the face-to-face time of a patient visit (documented in the note above) non-face-to-face time: obtaining and reviewing outside history, ordering and reviewing medications, tests or procedures, care coordination (communications with other health care professionals or caregivers) and documentation in the medical record.

## 2021-01-03 ENCOUNTER — Other Ambulatory Visit: Payer: Self-pay | Admitting: *Deleted

## 2021-01-03 ENCOUNTER — Inpatient Hospital Stay: Payer: BC Managed Care – PPO

## 2021-01-03 ENCOUNTER — Other Ambulatory Visit: Payer: Self-pay

## 2021-01-03 ENCOUNTER — Inpatient Hospital Stay: Payer: BC Managed Care – PPO | Attending: Oncology | Admitting: Oncology

## 2021-01-03 VITALS — BP 163/70 | HR 81 | Temp 97.5°F | Resp 18 | Ht 63.0 in | Wt 252.1 lb

## 2021-01-03 DIAGNOSIS — Z170 Estrogen receptor positive status [ER+]: Secondary | ICD-10-CM

## 2021-01-03 DIAGNOSIS — Z6841 Body Mass Index (BMI) 40.0 and over, adult: Secondary | ICD-10-CM | POA: Insufficient documentation

## 2021-01-03 DIAGNOSIS — Z923 Personal history of irradiation: Secondary | ICD-10-CM | POA: Diagnosis not present

## 2021-01-03 DIAGNOSIS — Z79810 Long term (current) use of selective estrogen receptor modulators (SERMs): Secondary | ICD-10-CM | POA: Insufficient documentation

## 2021-01-03 DIAGNOSIS — Z9221 Personal history of antineoplastic chemotherapy: Secondary | ICD-10-CM | POA: Insufficient documentation

## 2021-01-03 DIAGNOSIS — Z803 Family history of malignant neoplasm of breast: Secondary | ICD-10-CM | POA: Insufficient documentation

## 2021-01-03 DIAGNOSIS — D631 Anemia in chronic kidney disease: Secondary | ICD-10-CM | POA: Insufficient documentation

## 2021-01-03 DIAGNOSIS — R232 Flushing: Secondary | ICD-10-CM | POA: Diagnosis not present

## 2021-01-03 DIAGNOSIS — C50212 Malignant neoplasm of upper-inner quadrant of left female breast: Secondary | ICD-10-CM | POA: Diagnosis not present

## 2021-01-03 DIAGNOSIS — N183 Chronic kidney disease, stage 3 unspecified: Secondary | ICD-10-CM | POA: Insufficient documentation

## 2021-01-03 DIAGNOSIS — Z87891 Personal history of nicotine dependence: Secondary | ICD-10-CM | POA: Diagnosis not present

## 2021-01-03 LAB — CBC WITH DIFFERENTIAL (CANCER CENTER ONLY)
Abs Immature Granulocytes: 0.01 10*3/uL (ref 0.00–0.07)
Basophils Absolute: 0 10*3/uL (ref 0.0–0.1)
Basophils Relative: 0 %
Eosinophils Absolute: 0.1 10*3/uL (ref 0.0–0.5)
Eosinophils Relative: 2 %
HCT: 29.8 % — ABNORMAL LOW (ref 36.0–46.0)
Hemoglobin: 9.5 g/dL — ABNORMAL LOW (ref 12.0–15.0)
Immature Granulocytes: 0 %
Lymphocytes Relative: 25 %
Lymphs Abs: 1.3 10*3/uL (ref 0.7–4.0)
MCH: 29.5 pg (ref 26.0–34.0)
MCHC: 31.9 g/dL (ref 30.0–36.0)
MCV: 92.5 fL (ref 80.0–100.0)
Monocytes Absolute: 0.4 10*3/uL (ref 0.1–1.0)
Monocytes Relative: 8 %
Neutro Abs: 3.5 10*3/uL (ref 1.7–7.7)
Neutrophils Relative %: 65 %
Platelet Count: 167 10*3/uL (ref 150–400)
RBC: 3.22 MIL/uL — ABNORMAL LOW (ref 3.87–5.11)
RDW: 14.9 % (ref 11.5–15.5)
WBC Count: 5.3 10*3/uL (ref 4.0–10.5)
nRBC: 0 % (ref 0.0–0.2)

## 2021-01-03 LAB — CMP (CANCER CENTER ONLY)
ALT: 20 U/L (ref 0–44)
AST: 21 U/L (ref 15–41)
Albumin: 3.1 g/dL — ABNORMAL LOW (ref 3.5–5.0)
Alkaline Phosphatase: 115 U/L (ref 38–126)
Anion gap: 6 (ref 5–15)
BUN: 43 mg/dL — ABNORMAL HIGH (ref 6–20)
CO2: 23 mmol/L (ref 22–32)
Calcium: 8.2 mg/dL — ABNORMAL LOW (ref 8.9–10.3)
Chloride: 111 mmol/L (ref 98–111)
Creatinine: 3.21 mg/dL (ref 0.44–1.00)
GFR, Estimated: 17 mL/min — ABNORMAL LOW (ref >60)
Glucose, Bld: 207 mg/dL — ABNORMAL HIGH (ref 70–99)
Potassium: 4.5 mmol/L (ref 3.5–5.1)
Sodium: 140 mmol/L (ref 135–145)
Total Bilirubin: 0.2 mg/dL — ABNORMAL LOW (ref 0.3–1.2)
Total Protein: 6.9 g/dL (ref 6.5–8.1)

## 2021-01-03 MED ORDER — TAMOXIFEN CITRATE 20 MG PO TABS: 20.0000 mg | ORAL_TABLET | Freq: Every day | ORAL | 4 refills | Status: DC

## 2021-01-03 NOTE — Progress Notes (Signed)
Critcal Lab value/ CR: 3.21. MD notified.

## 2021-01-13 DIAGNOSIS — I10 Essential (primary) hypertension: Secondary | ICD-10-CM | POA: Diagnosis not present

## 2021-01-13 DIAGNOSIS — N184 Chronic kidney disease, stage 4 (severe): Secondary | ICD-10-CM | POA: Diagnosis not present

## 2021-01-13 DIAGNOSIS — N390 Urinary tract infection, site not specified: Secondary | ICD-10-CM | POA: Diagnosis not present

## 2021-04-19 ENCOUNTER — Encounter: Payer: Self-pay | Admitting: Oncology

## 2021-04-28 DIAGNOSIS — I10 Essential (primary) hypertension: Secondary | ICD-10-CM | POA: Diagnosis not present

## 2021-04-28 DIAGNOSIS — R809 Proteinuria, unspecified: Secondary | ICD-10-CM | POA: Diagnosis not present

## 2021-05-02 DIAGNOSIS — I10 Essential (primary) hypertension: Secondary | ICD-10-CM | POA: Diagnosis not present

## 2021-05-02 DIAGNOSIS — N184 Chronic kidney disease, stage 4 (severe): Secondary | ICD-10-CM | POA: Diagnosis not present

## 2021-05-02 DIAGNOSIS — N390 Urinary tract infection, site not specified: Secondary | ICD-10-CM | POA: Diagnosis not present

## 2021-06-14 ENCOUNTER — Other Ambulatory Visit: Payer: Self-pay | Admitting: Neurology

## 2021-06-14 DIAGNOSIS — R030 Elevated blood-pressure reading, without diagnosis of hypertension: Secondary | ICD-10-CM

## 2021-06-14 DIAGNOSIS — R351 Nocturia: Secondary | ICD-10-CM

## 2021-06-14 DIAGNOSIS — G4733 Obstructive sleep apnea (adult) (pediatric): Secondary | ICD-10-CM

## 2021-06-14 DIAGNOSIS — R0683 Snoring: Secondary | ICD-10-CM

## 2021-07-03 DIAGNOSIS — E1165 Type 2 diabetes mellitus with hyperglycemia: Secondary | ICD-10-CM | POA: Diagnosis not present

## 2021-07-03 DIAGNOSIS — N183 Chronic kidney disease, stage 3 unspecified: Secondary | ICD-10-CM | POA: Diagnosis not present

## 2021-07-03 DIAGNOSIS — Z0289 Encounter for other administrative examinations: Secondary | ICD-10-CM | POA: Diagnosis not present

## 2021-07-03 DIAGNOSIS — I10 Essential (primary) hypertension: Secondary | ICD-10-CM | POA: Diagnosis not present

## 2021-07-14 DIAGNOSIS — E1129 Type 2 diabetes mellitus with other diabetic kidney complication: Secondary | ICD-10-CM | POA: Diagnosis not present

## 2021-07-14 DIAGNOSIS — I10 Essential (primary) hypertension: Secondary | ICD-10-CM | POA: Diagnosis not present

## 2021-07-14 DIAGNOSIS — N183 Chronic kidney disease, stage 3 unspecified: Secondary | ICD-10-CM | POA: Diagnosis not present

## 2021-07-14 DIAGNOSIS — E7800 Pure hypercholesterolemia, unspecified: Secondary | ICD-10-CM | POA: Diagnosis not present

## 2021-09-17 DIAGNOSIS — J069 Acute upper respiratory infection, unspecified: Secondary | ICD-10-CM | POA: Diagnosis not present

## 2021-09-25 DIAGNOSIS — U071 COVID-19: Secondary | ICD-10-CM | POA: Diagnosis not present

## 2021-09-25 DIAGNOSIS — E860 Dehydration: Secondary | ICD-10-CM | POA: Diagnosis not present

## 2021-09-25 DIAGNOSIS — J189 Pneumonia, unspecified organism: Secondary | ICD-10-CM | POA: Diagnosis not present

## 2021-09-26 DIAGNOSIS — J189 Pneumonia, unspecified organism: Secondary | ICD-10-CM | POA: Diagnosis not present

## 2021-09-28 ENCOUNTER — Emergency Department (HOSPITAL_COMMUNITY)
Admission: EM | Admit: 2021-09-28 | Discharge: 2021-09-28 | Disposition: A | Discharge status: Home / Self Care | Payer: BC Managed Care – PPO | Attending: Emergency Medicine | Admitting: Emergency Medicine

## 2021-09-28 ENCOUNTER — Other Ambulatory Visit: Payer: Self-pay

## 2021-09-28 ENCOUNTER — Emergency Department (HOSPITAL_COMMUNITY): Payer: BC Managed Care – PPO

## 2021-09-28 DIAGNOSIS — Z7984 Long term (current) use of oral hypoglycemic drugs: Secondary | ICD-10-CM | POA: Diagnosis not present

## 2021-09-28 DIAGNOSIS — I129 Hypertensive chronic kidney disease with stage 1 through stage 4 chronic kidney disease, or unspecified chronic kidney disease: Secondary | ICD-10-CM | POA: Insufficient documentation

## 2021-09-28 DIAGNOSIS — Z853 Personal history of malignant neoplasm of breast: Secondary | ICD-10-CM | POA: Insufficient documentation

## 2021-09-28 DIAGNOSIS — R0602 Shortness of breath: Secondary | ICD-10-CM | POA: Diagnosis not present

## 2021-09-28 DIAGNOSIS — Z79899 Other long term (current) drug therapy: Secondary | ICD-10-CM | POA: Insufficient documentation

## 2021-09-28 DIAGNOSIS — R059 Cough, unspecified: Secondary | ICD-10-CM | POA: Diagnosis not present

## 2021-09-28 DIAGNOSIS — E119 Type 2 diabetes mellitus without complications: Secondary | ICD-10-CM | POA: Diagnosis not present

## 2021-09-28 DIAGNOSIS — Z794 Long term (current) use of insulin: Secondary | ICD-10-CM | POA: Insufficient documentation

## 2021-09-28 DIAGNOSIS — N183 Chronic kidney disease, stage 3 unspecified: Secondary | ICD-10-CM | POA: Insufficient documentation

## 2021-09-28 DIAGNOSIS — N179 Acute kidney failure, unspecified: Secondary | ICD-10-CM | POA: Insufficient documentation

## 2021-09-28 DIAGNOSIS — R509 Fever, unspecified: Secondary | ICD-10-CM | POA: Diagnosis not present

## 2021-09-28 DIAGNOSIS — Z87891 Personal history of nicotine dependence: Secondary | ICD-10-CM | POA: Insufficient documentation

## 2021-09-28 LAB — CBC WITH DIFFERENTIAL/PLATELET
Abs Immature Granulocytes: 0.03 10*3/uL (ref 0.00–0.07)
Basophils Absolute: 0 10*3/uL (ref 0.0–0.1)
Basophils Relative: 0 %
Eosinophils Absolute: 0.1 10*3/uL (ref 0.0–0.5)
Eosinophils Relative: 1 %
HCT: 31.1 % — ABNORMAL LOW (ref 36.0–46.0)
Hemoglobin: 10.1 g/dL — ABNORMAL LOW (ref 12.0–15.0)
Immature Granulocytes: 0 %
Lymphocytes Relative: 19 %
Lymphs Abs: 1.3 10*3/uL (ref 0.7–4.0)
MCH: 30.1 pg (ref 26.0–34.0)
MCHC: 32.5 g/dL (ref 30.0–36.0)
MCV: 92.8 fL (ref 80.0–100.0)
Monocytes Absolute: 0.7 10*3/uL (ref 0.1–1.0)
Monocytes Relative: 10 %
Neutro Abs: 4.7 10*3/uL (ref 1.7–7.7)
Neutrophils Relative %: 70 %
Platelets: 221 10*3/uL (ref 150–400)
RBC: 3.35 MIL/uL — ABNORMAL LOW (ref 3.87–5.11)
RDW: 13.6 % (ref 11.5–15.5)
WBC: 6.8 10*3/uL (ref 4.0–10.5)
nRBC: 0 % (ref 0.0–0.2)

## 2021-09-28 LAB — COMPREHENSIVE METABOLIC PANEL
ALT: 80 U/L — ABNORMAL HIGH (ref 0–44)
AST: 39 U/L (ref 15–41)
Albumin: 2.7 g/dL — ABNORMAL LOW (ref 3.5–5.0)
Alkaline Phosphatase: 91 U/L (ref 38–126)
Anion gap: 13 (ref 5–15)
BUN: 48 mg/dL — ABNORMAL HIGH (ref 6–20)
CO2: 20 mmol/L — ABNORMAL LOW (ref 22–32)
Calcium: 8.9 mg/dL (ref 8.9–10.3)
Chloride: 99 mmol/L (ref 98–111)
Creatinine, Ser: 4.38 mg/dL — ABNORMAL HIGH (ref 0.44–1.00)
GFR, Estimated: 11 mL/min — ABNORMAL LOW (ref >60)
Glucose, Bld: 370 mg/dL — ABNORMAL HIGH (ref 70–99)
Potassium: 5 mmol/L (ref 3.5–5.1)
Sodium: 132 mmol/L — ABNORMAL LOW (ref 135–145)
Total Bilirubin: 0.2 mg/dL — ABNORMAL LOW (ref 0.3–1.2)
Total Protein: 8 g/dL (ref 6.5–8.1)

## 2021-09-28 LAB — BASIC METABOLIC PANEL
Anion gap: 8 (ref 5–15)
BUN: 46 mg/dL — ABNORMAL HIGH (ref 6–20)
CO2: 19 mmol/L — ABNORMAL LOW (ref 22–32)
Calcium: 8.4 mg/dL — ABNORMAL LOW (ref 8.9–10.3)
Chloride: 106 mmol/L (ref 98–111)
Creatinine, Ser: 3.63 mg/dL — ABNORMAL HIGH (ref 0.44–1.00)
GFR, Estimated: 14 mL/min — ABNORMAL LOW (ref >60)
Glucose, Bld: 232 mg/dL — ABNORMAL HIGH (ref 70–99)
Potassium: 4.5 mmol/L (ref 3.5–5.1)
Sodium: 133 mmol/L — ABNORMAL LOW (ref 135–145)

## 2021-09-28 LAB — BRAIN NATRIURETIC PEPTIDE: B Natriuretic Peptide: 46.4 pg/mL (ref 0.0–100.0)

## 2021-09-28 MED ORDER — SODIUM CHLORIDE 0.9 % IV SOLN: Freq: Once | INTRAVENOUS | Status: AC

## 2021-09-28 MED ORDER — LACTATED RINGERS IV SOLN: INTRAVENOUS | Status: DC

## 2021-09-28 MED ORDER — LACTATED RINGERS IV BOLUS
1000.0000 mL | Freq: Once | INTRAVENOUS | Status: AC
  Administered 2021-09-28: 1000 mL via INTRAVENOUS

## 2021-09-28 MED ORDER — SODIUM CHLORIDE 0.9 % IV BOLUS
1000.0000 mL | Freq: Once | INTRAVENOUS | Status: AC
  Administered 2021-09-28: 1000 mL via INTRAVENOUS

## 2021-09-28 NOTE — Discharge Instructions (Addendum)
Dear Abigail Yoder,  Thank you for allowing [place] to take care of you today.  We hope you begin feeling better soon.  - Please follow-up with your primary care physician or schedule an appointment to establish a primary care doctor if you do not have one already. - Please return to the Emergency Department or call 911 for chest pain, shortness of breath, severe pain, altered mental status, inability to urinate, worsening pain, or if you have any reason to think you may need emergency medical care. -Remember to stay well-hydrated as discussed -Call your PCP for a close follow-up and a recheck of your kidney labs in the next week   Sincerely,  Cherly Hensen, DO Department of Emergency Medicine [place]   AKI (acute kidney injury) [facility])

## 2021-09-28 NOTE — ED Triage Notes (Signed)
Pt sent here from [facility] for abnormal labs - elevated kidney function, hyperglycemia, and elevated liver enzymes. Pt got diagnosed w/  covid last week. Pt endorses nonproductive cough, shob, weakness, fatigue, and decreased appetite.

## 2021-09-28 NOTE — ED Provider Notes (Signed)
[facility] EMERGENCY DEPARTMENT Provider Note   CSN: [id number] Arrival date & time: 09/28/21  1209     History Chief Complaint  Patient presents with   Shortness of Breath   Abnormal Labs    Abigail Yoder is a 55 y.o. female.   Shortness of Breath Associated symptoms: cough and fever   Associated symptoms: no abdominal pain, no chest pain, no ear pain, no headaches, no rash, no sore throat, no vomiting and no wheezing    55 year old female with PMH significant for type II DM, HTN, CKD, obesity, and others as below who presents to the ED with complaints of abnormal labs.  Patient was diagnosed with COVID-19 more than 1 week ago, states symptoms started on 10/26.  She has had a persistent productive cough and was initially febrile, but states she has not been febrile since her diagnosis.  She has associated fatigue and decreased appetite.  She went to an urgent care last week, where she had labs drawn on Monday.  She states she overall feels as if she is improving, but was called by the urgent care, who stated that her labs were abnormal and she needed to present to the ED for further care.  She denies any dizziness or lightheadedness, syncope, chest pain or shortness of breath, abdominal pain, nausea or vomiting, peripheral edema, or any other complaints.  She denies any history of CHF.  She has underwent treatment with Paxlovid and Levaquin, and has finished both courses. She states she feels thirsty.  Past Medical History:  Diagnosis Date   Anemia    Diabetes mellitus    Family history of breast cancer    History of radiation therapy 06/10/17- 07/08/17   Left Breast 2.67 Gy in 15 fractions. Left Breast boost 2 Gy in 5 fractions   Hypertension    Obesity     Patient Active Problem List   Diagnosis Date Noted   CKD stage 3 secondary to diabetes ([place]) 01/04/2020   Morbid obesity with BMI of 40.0-44.9, adult ([place]) 03/18/2018   Sepsis ([place]) 03/13/2017    Neutropenia with fever ([place]) 03/13/2017   Antineoplastic chemotherapy induced pancytopenia ([place])    Hypomagnesemia 02/08/2017   Trichomonas infection    Neutropenic fever ([place]) 02/07/2017   Genetic testing 12/18/2016   Family history of breast cancer    Malignant neoplasm of upper-inner quadrant of left breast in female, estrogen receptor positive ([place]) 11/27/2016   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 12/15/2012   Obesity (BMI 30-39.9) 05/17/2011   Essential hypertension 08/01/2007   Diabetes mellitus type 2, noninsulin dependent ([place]) 06/30/2007   HYPOGLYCEMIA NOS 06/30/2007   Iron deficiency anemia 06/30/2007   HEADACHE 06/30/2007    Past Surgical History:  Procedure Laterality Date   APPENDECTOMY     BREAST LUMPECTOMY WITH RADIOACTIVE SEED AND SENTINEL LYMPH NODE BIOPSY Left 12/31/2016   Procedure: BREAST LUMPECTOMY WITH RADIOACTIVE SEED AND SENTINEL LYMPH NODE BIOPSY;  Surgeon: Alphonsa Overall, MD;  Location: [facility] [employer];  Service: General;  Laterality: Left;   CERVICAL SPINE SURGERY     CESAREAN SECTION     IR REMOVAL TUN ACCESS W/ PORT W/O FL MOD SED  10/18/2017   KNEE SURGERY Left    PORTACATH PLACEMENT N/A 12/31/2016   Procedure: INSERTION PORT-A-CATH WITH [place];  Surgeon: Alphonsa Overall, MD;  Location: [facility];  Service: General;  Laterality: N/A;   TUBAL LIGATION       OB History  No obstetric history on file.     Family History  Problem Relation Age of Onset   Diabetes Mother    Arthritis Mother    Hypertension Father    Heart disease Father    Hyperlipidemia Father    Breast cancer Sister 66       came back 70 years later   Stroke Maternal Uncle    Stroke Maternal Grandmother     Social History   Tobacco Use   Smoking status: Former    Types: Cigarettes    Quit date: 11/19/1994    Years since quitting: 26.8   Smokeless tobacco: Never  Substance Use Topics   Alcohol use: No   Drug use: No    Home  Medications Prior to Admission medications   Medication Sig Start Date End Date Taking? Authorizing Provider  acetaminophen (TYLENOL) 325 MG tablet Take 650 mg by mouth every 6 (six) hours as needed for moderate pain, headache or fever.   Yes [provider]  amLODipine (NORVASC) 10 MG tablet Take 1 tablet (10 mg total) by mouth daily. 01/04/20  Yes [name], Virgie Dad, MD  carvedilol (COREG) 25 MG tablet Take 25 mg by mouth 2 (two) times daily. 02/03/20  Yes [provider]  chlorpheniramine (DIABETIC TUSSIN ALLERGY) 2 MG/5ML syrup Take 2 mg by mouth every 4 (four) hours as needed for allergies.   Yes [provider]  cholecalciferol (VITAMIN D3) 25 MCG (1000 UNIT) tablet Take 2,000 Units by mouth daily.  01/04/20  Yes [name], Virgie Dad, MD  Dapagliflozin Propanediol (FARXIGA PO) Take 10 mg by mouth daily.   Yes [provider]  Dextromethorphan-guaiFENesin ([place] DM MAXIMUM STRENGTH PO) Take 1 tablet by mouth every 12 (twelve) hours as needed (cough).   Yes [provider]  hydrALAZINE (APRESOLINE) 25 MG tablet Take 25 mg by mouth 3 (three) times daily. 02/01/20  Yes [provider]  levofloxacin (LEVAQUIN) 250 MG tablet Take 250 mg by mouth daily.   Yes [provider]  NOVOLOG MIX 70/30 FLEXPEN (70-30) 100 UNIT/ML FlexPen Inject 35 Units into the skin 2 (two) times daily with a meal. 03/14/20  Yes [provider]  sodium bicarbonate 650 MG tablet Take 650 mg by mouth 3 (three) times daily. 02/01/20  Yes [provider]  tamoxifen (NOLVADEX) 20 MG tablet Take 1 tablet (20 mg total) by mouth daily. 01/03/21  Yes [name], Virgie Dad, MD  prochlorperazine (COMPAZINE) 10 MG tablet Take 1 tablet (10 mg total) by mouth every 6 (six) hours as needed (Nausea or vomiting). 12/14/16 03/22/17  [name], Virgie Dad, MD    Allergies    Patient has no active allergies.  Review of Systems   Review of Systems  Constitutional:  Positive  for activity change, appetite change, fatigue and fever. Negative for chills.  HENT:  Negative for congestion, ear pain and sore throat.   Eyes:  Negative for photophobia, pain, redness and visual disturbance.  Respiratory:  Positive for cough. Negative for apnea, choking, chest tightness, shortness of breath, wheezing and stridor.   Cardiovascular:  Negative for chest pain, palpitations and leg swelling.  Gastrointestinal:  Negative for abdominal distention, abdominal pain, constipation, diarrhea, nausea and vomiting.  Genitourinary:  Negative for decreased urine volume.  Musculoskeletal:  Negative for arthralgias and back pain.  Skin:  Negative for color change, pallor and rash.  Neurological:  Negative for dizziness, tremors, seizures, syncope, weakness, light-headedness, numbness and headaches.  Psychiatric/Behavioral:  Negative for confusion. The  patient is not nervous/anxious.   All other systems reviewed and are negative.  Physical Exam Updated Vital Signs BP (!) 131/58   Pulse 86   Temp 98 F (36.7 C) (Oral)   Resp (!) 21   SpO2 96%   Physical Exam Vitals and nursing note reviewed.  Constitutional:      General: She is not in acute distress.    Appearance: Normal appearance. She is well-developed and normal weight. She is not ill-appearing, toxic-appearing or diaphoretic.  HENT:     Head: Normocephalic and atraumatic.     Right Ear: External ear normal.     Left Ear: External ear normal.     Nose: Nose normal.     Mouth/Throat:     Mouth: Mucous membranes are dry.     Pharynx: Oropharynx is clear.  Eyes:     General: No scleral icterus.    Extraocular Movements: Extraocular movements intact.     Conjunctiva/sclera: Conjunctivae normal.     Pupils: Pupils are equal, round, and reactive to light.  Neck:     Vascular: No JVD.     Trachea: Trachea and phonation normal.  Cardiovascular:     Rate and Rhythm: Normal rate and regular rhythm.     Pulses: Normal pulses.      Heart sounds: Normal heart sounds. No murmur heard. Pulmonary:     Effort: Pulmonary effort is normal. No tachypnea, bradypnea, accessory muscle usage, prolonged expiration, respiratory distress or retractions.     Breath sounds: Normal breath sounds and air entry.  Abdominal:     General: There is no distension.     Palpations: Abdomen is soft.     Tenderness: There is no abdominal tenderness. There is no guarding or rebound.  Musculoskeletal:        General: Normal range of motion.     Cervical back: Normal range of motion and neck supple. No rigidity or tenderness.     Right lower leg: Normal. No swelling or tenderness. No edema.     Left lower leg: Normal. No swelling or tenderness. No edema.  Lymphadenopathy:     Cervical: No cervical adenopathy.  Skin:    General: Skin is warm and dry.     Capillary Refill: Capillary refill takes less than 2 seconds.  Neurological:     General: No focal deficit present.     Mental Status: She is alert and oriented to person, place, and time. Mental status is at baseline.  Psychiatric:        Mood and Affect: Mood normal.        Behavior: Behavior normal.    ED Results / Procedures / Treatments   Labs (all labs ordered are listed, but only abnormal results are displayed) Labs Reviewed  CBC WITH DIFFERENTIAL/PLATELET - Abnormal; Notable for the following components:      Result Value   RBC 3.35 (*)    Hemoglobin 10.1 (*)    HCT 31.1 (*)    All other components within normal limits  COMPREHENSIVE METABOLIC PANEL - Abnormal; Notable for the following components:   Sodium 132 (*)    CO2 20 (*)    Glucose, Bld 370 (*)    BUN 48 (*)    Creatinine, Ser 4.38 (*)    Albumin 2.7 (*)    ALT 80 (*)    Total Bilirubin 0.2 (*)    GFR, Estimated 11 (*)    All other components within normal limits  BASIC METABOLIC PANEL -  Abnormal; Notable for the following components:   Sodium 133 (*)    CO2 19 (*)    Glucose, Bld 232 (*)    BUN 46 (*)     Creatinine, Ser 3.63 (*)    Calcium 8.4 (*)    GFR, Estimated 14 (*)    All other components within normal limits  BRAIN NATRIURETIC PEPTIDE    EKG EKG Interpretation  Date/Time:  Thursday September 28 2021 12:52:49 EST Ventricular Rate:  95 PR Interval:  122 QRS Duration: 70 QT Interval:  336 QTC Calculation: 422 R Axis:   57 Text Interpretation: Normal sinus rhythm Normal ECG No significant change since last tracing Confirmed by Gareth Morgan [phone]) on 09/28/2021 4:52:02 PM  Radiology DG Chest 2 View  Result Date: 09/28/2021 CLINICAL DATA:  Shortness of breath EXAM: CHEST - 2 VIEW COMPARISON:  None. FINDINGS: The heart size and mediastinal contours are within normal limits. Both lungs are clear. The visualized skeletal structures are unremarkable. IMPRESSION: No active cardiopulmonary disease. Electronically Signed   By: Keane Police D.O.   On: 09/28/2021 13:58    Procedures Procedures   Medications Ordered in ED Medications  lactated ringers bolus 1,000 mL (0 mLs Intravenous Stopped 09/28/21 1702)  sodium chloride 0.9 % bolus 1,000 mL (0 mLs Intravenous Stopped 09/28/21 1810)  0.9 %  sodium chloride infusion (0 mLs Intravenous Stopped 09/28/21 2144)    ED Course  I have reviewed the triage vital signs and the nursing notes.  Pertinent labs & imaging results that were available during my care of the patient were reviewed by me and considered in my medical decision making (see chart for details).    MDM Rules/Calculators/A&P                          KAHLEN BOYDE is a 55 y.o. female presenting with SOB, abnormal labs. Initial VS wnl.  EKG interpretation: NSR, normal intervals, rate 95 bpm, no ST elevations or depressions, possible right atrial enlargement.  Labs: CBC with normocytic anemia near prior baseline, otherwise unremarkable.  BNP 46.  CMP with hyperglycemia and normal anion gap, elevated BUN and creatinine from prior baseline.  Repeat BMP after fluids  revealed BUN and creatinine near prior baseline.  Imaging: CXR without acute cardiopulmonary abnormality. Imaging was reviewed by radiology and personally by me.  DDX considered: CAP, PE, ACS, intrarenal or postrenal kidney injury, DKA.  History, examination, and objective data most consistent with mild AKI, likely prerenal given insensible losses and decreased intake.  Patient given course of 2 nephrotoxic agents, may have contributed to worsening GFR.  No elevated anion gap or findings/symptoms concerning for DKA.  Patient endorses low fluid intake and appears clinically dry on exam.  Wells PE criteria 0.  No new respiratory symptoms, or systemic infectious signs or symptoms.  Low concern for pneumonia, XR did not reveal focal lobar opacities.  Medications: Medications  lactated ringers bolus 1,000 mL (0 mLs Intravenous Stopped 09/28/21 1702)  sodium chloride 0.9 % bolus 1,000 mL (0 mLs Intravenous Stopped 09/28/21 1810)  0.9 %  sodium chloride infusion (0 mLs Intravenous Stopped 09/28/21 2144)    Re-evaluated prior to interventions.  States she feels much better. Hemodynamically stable and in no acute distress. Tolerating p.o. fluids well in ED. Repeat BMP notable for creatinine returned to prior baseline.  Patient does not meet criteria for inpatient treatment of AKI given resolution with fluids.  Discharged home in stable  condition.  Encouraged increased p.o. fluid intake and close f/u with PCP.  Strict ED return precautions advised. Supportive care discussed. Outpatient PCP follow-up advised. Patient understands and agrees with the plan.   The plan for this patient was discussed with my attending physician, who voiced agreement and who oversaw evaluation and treatment of this patient.     Note: Estate manager/land agent was used in the creation of this note.  Final Clinical Impression(s) / ED Diagnoses Final diagnoses:  AKI (acute kidney injury) [facility])    Rx / [place] Orders ED  Discharge Orders     None        Cherly Hensen, DO 09/29/21 0030    Gareth Morgan, MD 09/29/21 1414

## 2021-09-28 NOTE — ED Provider Notes (Signed)
Emergency Medicine Provider Triage Evaluation Note  Abigail Yoder , a 55 y.o. female  was evaluated in triage.  Pt complains of shortness of breath and abnormal lab values.  Last Sunday patient was seen at urgent care due to shortness of breath and cough, diagnosed with COVID.  Was informed that her creatinine level had doubled before and that she had elevated liver enzymes and should go to the emergency department.  Patient denies any abdominal pain, reports she did start a new diabetes medicine a few months ago called [place] but no epigastric pain with radiation to the back..  Review of Systems  Positive: Shortness of breath, cough, abnormal lab Negative: Abdominal pain, nausea, vomiting  Physical Exam  BP (!) 149/88 (BP Location: Right Arm)   Pulse (!) 105   Temp 99 F (37.2 C) (Oral)   Resp 20   SpO2 98%  Gen:   Awake, no distress   Resp:  Normal effort lungs are clear to auscultation bilaterally MSK:   Moves extremities without difficulty Other:  Abdomen soft and nontender, mildly tachycardic at 105  Medical Decision Making  Medically screening exam initiated at 1:01 PM.  Appropriate orders placed.  Abigail Yoder was informed that the remainder of the evaluation will be completed by another provider, this initial triage assessment does not replace that evaluation, and the importance of remaining in the ED until their evaluation is complete.  Shortness of breath labs, will check creatinine and LFTs.   Sherrill Raring, PA-C 09/28/21 [place], [place], DO 09/28/21 1321

## 2021-10-03 DIAGNOSIS — U071 COVID-19: Secondary | ICD-10-CM | POA: Diagnosis not present

## 2021-10-03 DIAGNOSIS — E871 Hypo-osmolality and hyponatremia: Secondary | ICD-10-CM | POA: Diagnosis not present

## 2021-10-03 DIAGNOSIS — D649 Anemia, unspecified: Secondary | ICD-10-CM | POA: Diagnosis not present

## 2021-10-03 DIAGNOSIS — N179 Acute kidney failure, unspecified: Secondary | ICD-10-CM | POA: Diagnosis not present

## 2021-10-07 ENCOUNTER — Other Ambulatory Visit: Payer: Self-pay

## 2021-10-07 ENCOUNTER — Emergency Department (HOSPITAL_COMMUNITY)
Admission: EM | Admit: 2021-10-07 | Discharge: 2021-10-07 | Disposition: A | Discharge status: Home / Self Care | Payer: BC Managed Care – PPO | Attending: Emergency Medicine | Admitting: Emergency Medicine

## 2021-10-07 ENCOUNTER — Emergency Department (HOSPITAL_COMMUNITY): Payer: BC Managed Care – PPO

## 2021-10-07 ENCOUNTER — Encounter (HOSPITAL_COMMUNITY): Payer: Self-pay | Admitting: Emergency Medicine

## 2021-10-07 DIAGNOSIS — I129 Hypertensive chronic kidney disease with stage 1 through stage 4 chronic kidney disease, or unspecified chronic kidney disease: Secondary | ICD-10-CM | POA: Insufficient documentation

## 2021-10-07 DIAGNOSIS — D72819 Decreased white blood cell count, unspecified: Secondary | ICD-10-CM | POA: Diagnosis not present

## 2021-10-07 DIAGNOSIS — Z20822 Contact with and (suspected) exposure to covid-19: Secondary | ICD-10-CM | POA: Insufficient documentation

## 2021-10-07 DIAGNOSIS — Z794 Long term (current) use of insulin: Secondary | ICD-10-CM | POA: Insufficient documentation

## 2021-10-07 DIAGNOSIS — Z87891 Personal history of nicotine dependence: Secondary | ICD-10-CM | POA: Diagnosis not present

## 2021-10-07 DIAGNOSIS — E1122 Type 2 diabetes mellitus with diabetic chronic kidney disease: Secondary | ICD-10-CM | POA: Diagnosis not present

## 2021-10-07 DIAGNOSIS — I10 Essential (primary) hypertension: Secondary | ICD-10-CM | POA: Diagnosis not present

## 2021-10-07 DIAGNOSIS — N183 Chronic kidney disease, stage 3 unspecified: Secondary | ICD-10-CM | POA: Diagnosis not present

## 2021-10-07 DIAGNOSIS — Z853 Personal history of malignant neoplasm of breast: Secondary | ICD-10-CM | POA: Diagnosis not present

## 2021-10-07 DIAGNOSIS — Z8616 Personal history of COVID-19: Secondary | ICD-10-CM | POA: Insufficient documentation

## 2021-10-07 DIAGNOSIS — J101 Influenza due to other identified influenza virus with other respiratory manifestations: Secondary | ICD-10-CM | POA: Insufficient documentation

## 2021-10-07 DIAGNOSIS — R531 Weakness: Secondary | ICD-10-CM | POA: Diagnosis not present

## 2021-10-07 DIAGNOSIS — Z79899 Other long term (current) drug therapy: Secondary | ICD-10-CM | POA: Insufficient documentation

## 2021-10-07 DIAGNOSIS — R059 Cough, unspecified: Secondary | ICD-10-CM | POA: Diagnosis not present

## 2021-10-07 LAB — BASIC METABOLIC PANEL
Anion gap: 10 (ref 5–15)
BUN: 42 mg/dL — ABNORMAL HIGH (ref 6–20)
CO2: 22 mmol/L (ref 22–32)
Calcium: 8.7 mg/dL — ABNORMAL LOW (ref 8.9–10.3)
Chloride: 101 mmol/L (ref 98–111)
Creatinine, Ser: 4.01 mg/dL — ABNORMAL HIGH (ref 0.44–1.00)
GFR, Estimated: 13 mL/min — ABNORMAL LOW (ref >60)
Glucose, Bld: 234 mg/dL — ABNORMAL HIGH (ref 70–99)
Potassium: 4.3 mmol/L (ref 3.5–5.1)
Sodium: 133 mmol/L — ABNORMAL LOW (ref 135–145)

## 2021-10-07 LAB — CBC
HCT: 31 % — ABNORMAL LOW (ref 36.0–46.0)
Hemoglobin: 10.1 g/dL — ABNORMAL LOW (ref 12.0–15.0)
MCH: 30.1 pg (ref 26.0–34.0)
MCHC: 32.6 g/dL (ref 30.0–36.0)
MCV: 92.3 fL (ref 80.0–100.0)
Platelets: 162 10*3/uL (ref 150–400)
RBC: 3.36 MIL/uL — ABNORMAL LOW (ref 3.87–5.11)
RDW: 14.9 % (ref 11.5–15.5)
WBC: 3.1 10*3/uL — ABNORMAL LOW (ref 4.0–10.5)
nRBC: 0 % (ref 0.0–0.2)

## 2021-10-07 LAB — URINALYSIS, ROUTINE W REFLEX MICROSCOPIC
Bilirubin Urine: NEGATIVE
Glucose, UA: >=500 mg/dL — AB
Ketones, ur: NEGATIVE mg/dL
Leukocytes,Ua: NEGATIVE
Nitrite: NEGATIVE
Protein, ur: 100 mg/dL — AB
Specific Gravity, Urine: 1.016 (ref 1.005–1.030)
pH: 5 (ref 5.0–8.0)

## 2021-10-07 LAB — RESP PANEL BY RT-PCR (FLU A&B, COVID) ARPGX2
Influenza A by PCR: POSITIVE — AB
Influenza B by PCR: NEGATIVE
SARS Coronavirus 2 by RT PCR: NEGATIVE

## 2021-10-07 LAB — LACTIC ACID, PLASMA: Lactic Acid, Venous: 1.8 mmol/L (ref 0.5–1.9)

## 2021-10-07 MED ORDER — ACETAMINOPHEN 325 MG PO TABS
650.0000 mg | ORAL_TABLET | Freq: Once | ORAL | Status: AC
  Administered 2021-10-07: 650 mg via ORAL
  Filled 2021-10-07: qty 2

## 2021-10-07 MED ORDER — SODIUM CHLORIDE 0.9 % IV BOLUS
1000.0000 mL | Freq: Once | INTRAVENOUS | Status: AC
  Administered 2021-10-07: 1000 mL via INTRAVENOUS

## 2021-10-07 NOTE — ED Provider Notes (Signed)
[facility] EMERGENCY DEPARTMENT Provider Note   CSN: [id number] Arrival date & time: 10/07/21  0920   History Chief Complaint  Patient presents with   Weakness    Abigail Yoder is a 55 y.o. female.   Weakness Associated symptoms: cough, diarrhea, fever and shortness of breath   Associated symptoms: no abdominal pain, no arthralgias, no chest pain, no dizziness, no dysuria, no headaches, no nausea, no seizures and no vomiting    55 year old female with PMH significant for IDDM, anemia, HTN, obesity, and breast cancer s/p radiation who presents to the ED with multiple complaints.  Patient reports that she was diagnosed with COVID-19 in October and underwent treatment with Paxlovid.  She had a prolonged course of coughing and fatigue that had begun to resolve, but starting Tuesday, patient developed a fever up to 103 F and has been febrile every day since.  She saw her PCP and urgent care, who prescribed Tessalon Perles and "an antibiotic that starts with P" she states have been minimally helping her symptoms.  Upon review of records, patient has Levaquin on her medication list.  Patient reports associated worsening productive cough, generalized fatigue and weakness, decreased appetite and p.o. intake, wheezing, and watery diarrhea.  She denies any chest pain, syncope, palpitations, abdominal pain, nausea or vomiting, rash, or other complaints.  She is COVID-19 vaccinated, but has not had a flu vaccine.  Past Medical History:  Diagnosis Date   Anemia    Diabetes mellitus    Family history of breast cancer    History of radiation therapy 06/10/17- 07/08/17   Left Breast 2.67 Gy in 15 fractions. Left Breast boost 2 Gy in 5 fractions   Hypertension    Obesity     Patient Active Problem List   Diagnosis Date Noted   CKD stage 3 secondary to diabetes ([place]) 01/04/2020   Morbid obesity with BMI of 40.0-44.9, adult ([place]) 03/18/2018   Sepsis ([place]) 03/13/2017    Neutropenia with fever ([place]) 03/13/2017   Antineoplastic chemotherapy induced pancytopenia ([place])    Hypomagnesemia 02/08/2017   Trichomonas infection    Neutropenic fever ([place]) 02/07/2017   Genetic testing 12/18/2016   Family history of breast cancer    Malignant neoplasm of upper-inner quadrant of left breast in female, estrogen receptor positive ([place]) 11/27/2016   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 12/15/2012   Obesity (BMI 30-39.9) 05/17/2011   Essential hypertension 08/01/2007   Diabetes mellitus type 2, noninsulin dependent ([place]) 06/30/2007   HYPOGLYCEMIA NOS 06/30/2007   Iron deficiency anemia 06/30/2007   HEADACHE 06/30/2007    Past Surgical History:  Procedure Laterality Date   APPENDECTOMY     BREAST LUMPECTOMY WITH RADIOACTIVE SEED AND SENTINEL LYMPH NODE BIOPSY Left 12/31/2016   Procedure: BREAST LUMPECTOMY WITH RADIOACTIVE SEED AND SENTINEL LYMPH NODE BIOPSY;  Surgeon: Alphonsa Overall, MD;  Location: [facility];  Service: General;  Laterality: Left;   CERVICAL SPINE SURGERY     CESAREAN SECTION     IR REMOVAL TUN ACCESS W/ PORT W/O FL MOD SED  10/18/2017   KNEE SURGERY Left    PORTACATH PLACEMENT N/A 12/31/2016   Procedure: INSERTION PORT-A-CATH WITH [place];  Surgeon: Alphonsa Overall, MD;  Location: [facility];  Service: General;  Laterality: N/A;   TUBAL LIGATION       OB History   No obstetric history on file.     Family History  Problem Relation Age of Onset  Diabetes Mother    Arthritis Mother    Hypertension Father    Heart disease Father    Hyperlipidemia Father    Breast cancer Sister 11       came back 46 years later   Stroke Maternal Uncle    Stroke Maternal Grandmother     Social History   Tobacco Use   Smoking status: Former    Types: Cigarettes    Quit date: 11/19/1994    Years since quitting: 26.9   Smokeless tobacco: Never  Substance Use Topics   Alcohol use: No   Drug use: No    Home  Medications Prior to Admission medications   Medication Sig Start Date End Date Taking? Authorizing Provider  acetaminophen (TYLENOL) 325 MG tablet Take 650 mg by mouth every 6 (six) hours as needed for moderate pain, headache or fever.   Yes [provider]  amLODipine (NORVASC) 10 MG tablet Take 1 tablet (10 mg total) by mouth daily. 01/04/20  Yes [name], Virgie Dad, MD  benzonatate (TESSALON) 100 MG capsule Take 100 mg by mouth 3 (three) times daily as needed for cough.   Yes [provider]  carvedilol (COREG) 25 MG tablet Take 25 mg by mouth 2 (two) times daily. 02/03/20  Yes [provider]  chlorpheniramine (DIABETIC TUSSIN ALLERGY) 2 MG/5ML syrup Take 2 mg by mouth every 4 (four) hours as needed for allergies.   Yes [provider]  Dapagliflozin Propanediol (FARXIGA PO) Take 10 mg by mouth daily.   Yes [provider]  Dextromethorphan-guaiFENesin ([place] DM MAXIMUM STRENGTH PO) Take 1 tablet by mouth every 12 (twelve) hours as needed (cough).   Yes [provider]  hydrALAZINE (APRESOLINE) 25 MG tablet Take 25 mg by mouth 3 (three) times daily. 02/01/20  Yes [provider]  NOVOLOG MIX 70/30 FLEXPEN (70-30) 100 UNIT/ML FlexPen Inject 35 Units into the skin 2 (two) times daily with a meal. 03/14/20  Yes [provider]  sodium bicarbonate 650 MG tablet Take 650 mg by mouth 3 (three) times daily. 02/01/20  Yes [provider]  tamoxifen (NOLVADEX) 20 MG tablet Take 1 tablet (20 mg total) by mouth daily. 01/03/21  Yes [name], Virgie Dad, MD  prochlorperazine (COMPAZINE) 10 MG tablet Take 1 tablet (10 mg total) by mouth every 6 (six) hours as needed (Nausea or vomiting). 12/14/16 03/22/17  [name], Virgie Dad, MD    Allergies    Patient has no active allergies.  Review of Systems   Review of Systems  Constitutional:  Positive for activity change, appetite change, chills, fatigue and fever.  HENT:  Positive for  congestion and voice change. Negative for ear pain, rhinorrhea, sore throat and trouble swallowing.   Eyes:  Negative for pain and visual disturbance.  Respiratory:  Positive for cough, shortness of breath and wheezing. Negative for choking, chest tightness and stridor.   Cardiovascular:  Negative for chest pain, palpitations and leg swelling.  Gastrointestinal:  Positive for diarrhea. Negative for abdominal distention, abdominal pain, constipation, nausea and vomiting.  Genitourinary:  Negative for dysuria and hematuria.  Musculoskeletal:  Negative for arthralgias, back pain, neck pain and neck stiffness.  Skin:  Negative for color change, pallor and rash.  Neurological:  Positive for weakness. Negative for dizziness, tremors, seizures, syncope, light-headedness, numbness and headaches.  Psychiatric/Behavioral:  Negative for confusion. The patient is not nervous/anxious.   All other systems reviewed and are negative.  Physical Exam Updated Vital Signs BP 105/65  Pulse 81   Temp 98.6 F (37 C)   Resp 16   SpO2 99%   Physical Exam Vitals and nursing note reviewed.  Constitutional:      General: She is not in acute distress.    Appearance: Normal appearance. She is well-developed. She is obese. She is ill-appearing. She is not diaphoretic.     Comments: Rigors and shivering, patient under several blankets with gloves and hat.  HENT:     Head: Normocephalic and atraumatic.     Jaw: There is normal jaw occlusion.     Right Ear: External ear normal.     Left Ear: External ear normal.     Nose: Nose normal.     Mouth/Throat:     Mouth: Mucous membranes are dry.     Pharynx: Oropharynx is clear. No oropharyngeal exudate or posterior oropharyngeal erythema.  Eyes:     General:        Right eye: No discharge.        Left eye: No discharge.     Extraocular Movements: Extraocular movements intact.     Conjunctiva/sclera: Conjunctivae normal.     Pupils: Pupils are equal, round, and  reactive to light.  Cardiovascular:     Rate and Rhythm: Normal rate and regular rhythm.     Pulses: Normal pulses.     Heart sounds: Normal heart sounds. No murmur heard. Pulmonary:     Effort: Pulmonary effort is normal. No respiratory distress.     Breath sounds: Normal breath sounds and air entry. No stridor. No wheezing, rhonchi or rales.  Chest:     Chest wall: No tenderness or crepitus.  Abdominal:     General: Abdomen is flat. There is no distension.     Palpations: Abdomen is soft.     Tenderness: There is no abdominal tenderness. There is no guarding or rebound.  Musculoskeletal:        General: No swelling. Normal range of motion.     Cervical back: Normal range of motion and neck supple. No rigidity or tenderness.     Right lower leg: No edema.     Left lower leg: No edema.  Lymphadenopathy:     Cervical: No cervical adenopathy.  Skin:    General: Skin is warm and dry.     Capillary Refill: Capillary refill takes less than 2 seconds.     Findings: No rash.  Neurological:     General: No focal deficit present.     Mental Status: She is alert and oriented to person, place, and time. Mental status is at baseline.     GCS: GCS eye subscore is 4. GCS verbal subscore is 5. GCS motor subscore is 6.  Psychiatric:        Mood and Affect: Mood normal.        Behavior: Behavior normal. Behavior is cooperative.    ED Results / Procedures / Treatments   Labs (all labs ordered are listed, but only abnormal results are displayed) Labs Reviewed  RESP PANEL BY RT-PCR (FLU A&B, COVID) ARPGX2 - Abnormal; Notable for the following components:      Result Value   Influenza A by PCR POSITIVE (*)    All other components within normal limits  BASIC METABOLIC PANEL - Abnormal; Notable for the following components:   Sodium 133 (*)    Glucose, Bld 234 (*)    BUN 42 (*)    Creatinine, Ser 4.01 (*)    Calcium 8.7 (*)  GFR, Estimated 13 (*)    All other components within normal  limits  CBC - Abnormal; Notable for the following components:   WBC 3.1 (*)    RBC 3.36 (*)    Hemoglobin 10.1 (*)    HCT 31.0 (*)    All other components within normal limits  URINALYSIS, ROUTINE W REFLEX MICROSCOPIC - Abnormal; Notable for the following components:   APPearance HAZY (*)    Glucose, UA >=500 (*)    Hgb urine dipstick SMALL (*)    Protein, ur 100 (*)    Bacteria, UA RARE (*)    All other components within normal limits  CULTURE, BLOOD (ROUTINE X 2)  CULTURE, BLOOD (ROUTINE X 2)  LACTIC ACID, PLASMA    EKG EKG Interpretation  Date/Time:  Saturday October 07 2021 14:50:08 EST Ventricular Rate:  77 PR Interval:  138 QRS Duration: 72 QT Interval:  376 QTC Calculation: 425 R Axis:   58 Text Interpretation: Normal sinus rhythm Nonspecific T wave abnormality Abnormal ECG No significant change since last tracing Confirmed by Wandra Arthurs [id number]) on 10/07/2021 3:53:14 PM  Radiology DG Chest 2 View  Result Date: 10/07/2021 CLINICAL DATA:  Nonproductive cough. EXAM: CHEST - 2 VIEW COMPARISON:  09/28/2021 FINDINGS: The lungs are clear without focal pneumonia, edema, pneumothorax or pleural effusion. The cardiopericardial silhouette is within normal limits for size. The visualized bony structures of the thorax show no acute abnormality. IMPRESSION: No active cardiopulmonary disease. Electronically Signed   By: Misty Stanley M.D.   On: 10/07/2021 11:38    Procedures Procedures   Medications Ordered in ED Medications  sodium chloride 0.9 % bolus 1,000 mL (0 mLs Intravenous Stopped 10/07/21 1841)  acetaminophen (TYLENOL) tablet 650 mg (650 mg Oral Given 10/07/21 1627)    ED Course  I have reviewed the triage vital signs and the nursing notes.  Pertinent labs & imaging results that were available during my care of the patient were reviewed by me and considered in my medical decision making (see chart for details).    MDM Rules/Calculators/A&P                           Abigail Yoder is a 55 y.o. female presenting with generalized weakness. Initial VS wnl.  EKG interpretation: NSR, rate 77 bpm, normal intervals, no ST elevations or depressions, nonspecific T wave changes.  No significant change from prior..  Labs: Hyperglycemia with normal anion gap, creatinine elevated from prior baseline. Mild leukopenia and normocytic anemia near prior baseline. UA with rare bacteria, negative nitrite and leukocyte esterase, small hematuria and glucosuria.  Influenza A positive.  Lactic acid 1.8.  Imaging: CXR negative for acute cardiopulmonary abnormalities. Imaging was reviewed by radiology and personally by me.  DDX considered: Pneumonia, pneumothorax, COVID-19, URI, myocarditis, sepsis, CVA, UTI.  History, examination, and objective data most consistent with influenza A infection.  No focal lobar opacities concerning for pneumonia.  Patient denies chest pain, low concern for myocarditis given flu positive test.  Patient overall well-appearing with normal work of breathing.  No signs of hemodynamic stability and patient does not meet sepsis criteria.  Appears mildly clinically dehydrated.  Neurologic exam nonfocal.  Medications: Medications  sodium chloride 0.9 % bolus 1,000 mL (0 mLs Intravenous Stopped 10/07/21 1841)  acetaminophen (TYLENOL) tablet 650 mg (650 mg Oral Given 10/07/21 1627)    Re-evaluated after interventions.  States symptoms have improved. Hemodynamically stable and in no  acute distress. She is outside the treatment window for Tamiflu.  Discharged home in stable condition. Strict ED return precautions advised. Supportive care discussed. Outpatient PCP follow-up advised. Patient understands and agrees with the plan.   The plan for this patient was discussed with my attending physician, who voiced agreement and who oversaw evaluation and treatment of this patient.     Note: Estate manager/land agent was used in the creation of this  note.  Final Clinical Impression(s) / ED Diagnoses Final diagnoses:  Influenza A    Rx / DC Orders ED Discharge Orders     None        Cherly Hensen, DO 10/08/21 0214    Drenda Freeze, MD 10/08/21 [phone]

## 2021-10-07 NOTE — ED Notes (Signed)
Dr. Darl Householder at [facility], pt in b/r.

## 2021-10-07 NOTE — ED Triage Notes (Addendum)
Pt states she had COVID in October.  Reports continued non-productive cough, generalized weakness, diarrhea, and L sided rib pain.

## 2021-10-07 NOTE — Discharge Instructions (Signed)
Dear Abigail Yoder,  Thank you for allowing [place] to take care of you today.  We hope you begin feeling better soon.  - Please follow-up with your primary care physician or schedule an appointment to establish a primary care doctor if you do not have one already. - Please return to the Emergency Department or call 911 for chest pain, shortness of breath, severe pain, altered mental status, or if you have any reason to think you may need emergency medical care. -Remember to stay well-hydrated -May continue to use Tylenol every 4-6 hours as needed for pain or fever -Follow-up with your PCP in the next week -Consider getting influenza vaccine when you are feeling better   Sincerely,  Cherly Hensen, DO Department of Emergency Medicine [place]   Influenza A

## 2021-10-07 NOTE — ED Notes (Signed)
Up to b/r, shaky slow steady gait. Attempting urine sample

## 2021-10-12 LAB — CULTURE, BLOOD (ROUTINE X 2)
Culture: NO GROWTH
Culture: NO GROWTH
Special Requests: ADEQUATE
Special Requests: ADEQUATE

## 2021-12-07 ENCOUNTER — Encounter: Payer: Self-pay | Admitting: Adult Health

## 2021-12-07 DIAGNOSIS — Z1231 Encounter for screening mammogram for malignant neoplasm of breast: Secondary | ICD-10-CM | POA: Diagnosis not present

## 2022-02-02 ENCOUNTER — Telehealth: Payer: Self-pay | Admitting: Adult Health

## 2022-02-02 ENCOUNTER — Inpatient Hospital Stay: Payer: BC Managed Care – PPO

## 2022-02-02 ENCOUNTER — Inpatient Hospital Stay: Payer: BC Managed Care – PPO | Admitting: Adult Health

## 2022-02-02 NOTE — Telephone Encounter (Signed)
Scheduled appointment per 3/17 secure chat. Patient is aware of the changes made to her upcoming appointment. ?

## 2022-03-06 ENCOUNTER — Other Ambulatory Visit: Payer: Self-pay

## 2022-03-06 DIAGNOSIS — Z170 Estrogen receptor positive status [ER+]: Secondary | ICD-10-CM

## 2022-03-08 ENCOUNTER — Other Ambulatory Visit: Payer: Self-pay

## 2022-03-08 ENCOUNTER — Inpatient Hospital Stay: Payer: BC Managed Care – PPO | Admitting: Adult Health

## 2022-03-08 ENCOUNTER — Inpatient Hospital Stay: Payer: BC Managed Care – PPO | Attending: Adult Health

## 2022-03-08 ENCOUNTER — Encounter: Payer: Self-pay | Admitting: Adult Health

## 2022-03-08 VITALS — BP 165/77 | HR 84 | Temp 97.2°F | Resp 18 | Ht 63.0 in | Wt 234.7 lb

## 2022-03-08 DIAGNOSIS — Z170 Estrogen receptor positive status [ER+]: Secondary | ICD-10-CM

## 2022-03-08 DIAGNOSIS — C50212 Malignant neoplasm of upper-inner quadrant of left female breast: Secondary | ICD-10-CM | POA: Insufficient documentation

## 2022-03-08 LAB — CBC WITH DIFFERENTIAL (CANCER CENTER ONLY)
Abs Immature Granulocytes: 0.01 10*3/uL (ref 0.00–0.07)
Basophils Absolute: 0 10*3/uL (ref 0.0–0.1)
Basophils Relative: 0 %
Eosinophils Absolute: 0.2 10*3/uL (ref 0.0–0.5)
Eosinophils Relative: 4 %
HCT: 29.3 % — ABNORMAL LOW (ref 36.0–46.0)
Hemoglobin: 9.7 g/dL — ABNORMAL LOW (ref 12.0–15.0)
Immature Granulocytes: 0 %
Lymphocytes Relative: 23 %
Lymphs Abs: 1.1 10*3/uL (ref 0.7–4.0)
MCH: 30.3 pg (ref 26.0–34.0)
MCHC: 33.1 g/dL (ref 30.0–36.0)
MCV: 91.6 fL (ref 80.0–100.0)
Monocytes Absolute: 0.5 10*3/uL (ref 0.1–1.0)
Monocytes Relative: 9 %
Neutro Abs: 3.1 10*3/uL (ref 1.7–7.7)
Neutrophils Relative %: 64 %
Platelet Count: 159 10*3/uL (ref 150–400)
RBC: 3.2 MIL/uL — ABNORMAL LOW (ref 3.87–5.11)
RDW: 15.2 % (ref 11.5–15.5)
WBC Count: 5 10*3/uL (ref 4.0–10.5)
nRBC: 0 % (ref 0.0–0.2)

## 2022-03-08 LAB — CMP (CANCER CENTER ONLY)
ALT: 15 U/L (ref 0–44)
AST: 17 U/L (ref 15–41)
Albumin: 3.3 g/dL — ABNORMAL LOW (ref 3.5–5.0)
Alkaline Phosphatase: 80 U/L (ref 38–126)
Anion gap: 6 (ref 5–15)
BUN: 53 mg/dL — ABNORMAL HIGH (ref 6–20)
CO2: 25 mmol/L (ref 22–32)
Calcium: 8.5 mg/dL — ABNORMAL LOW (ref 8.9–10.3)
Chloride: 110 mmol/L (ref 98–111)
Creatinine: 3.68 mg/dL (ref 0.44–1.00)
GFR, Estimated: 14 mL/min — ABNORMAL LOW (ref >60)
Glucose, Bld: 135 mg/dL — ABNORMAL HIGH (ref 70–99)
Potassium: 4.4 mmol/L (ref 3.5–5.1)
Sodium: 141 mmol/L (ref 135–145)
Total Bilirubin: 0.3 mg/dL (ref 0.3–1.2)
Total Protein: 7.3 g/dL (ref 6.5–8.1)

## 2022-03-08 MED ORDER — TAMOXIFEN CITRATE 20 MG PO TABS: 20.0000 mg | ORAL_TABLET | Freq: Every day | ORAL | 4 refills | Status: DC

## 2022-03-08 NOTE — Progress Notes (Signed)
[facility] Follow up: ?  ? ?[name], [address] ?[place] [address] ? ? ?DIAGNOSIS:  Cancer Staging  ?Malignant neoplasm of upper-inner quadrant of left breast in female, estrogen receptor positive ([place]) ?Staging form: Breast, AJCC 8th Edition ?- Clinical stage from 11/28/2016: Stage IA (cT1c, cN0, cM0, G3, ER+, PR+, HER2-) - Unsigned ?Histologic grading system: 3 grade system ?Laterality: Left ?Stage used in treatment planning: Yes ?National guidelines used in treatment planning: Yes ?Type of national guideline used in treatment planning: NCCN ?- Pathologic: Stage IA (pT1c, pN0, cM0, G3, ER+, PR-, HER2-) - Unsigned ?Histologic grading system: 3 grade system ? ? ?SUMMARY OF ONCOLOGIC HISTORY: ?56 y.o. [place] woman status post left breast upper inner quadrant biopsy 11/21/2016 for a clinical T1c N0, stage IA invasive ductal carcinoma, grade 3, estrogen and progesterone receptor positive, HER-2 not amplified, with an MIB-1 of 90% ?  ?(1) Oncotype DX Score of 60 predicts a risk of recurrence outside the breast of 34% if the patient's only systemic therapy is tamoxifen for 5 years. On a similar database this risk drops to about 12% if chemotherapy is also given ?  ?(2) genetics testing sent 11/28/2016; The Breast/GYN gene panel offered by [facility] includes sequencing and rearrangement analysis for the following 23 genes:  ATM, BARD1, BRCA1, BRCA2, BRIP1, CDH1, CHEK2, EPCAM, FANCC, MLH1, MSH2, MSH6, MUTYH, NBN, NF1, PALB2, PMS2, POLD1, PTEN, RAD51C, RAD51D, RECQL, and TP53.   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   ?  ?(3) status post left breast lumpectomy on 12/31/2016: invasive ductal carcinoma, 1.8cm, grade 3, margins negative, 1 SLN negative, ER+(80%), PR-(2%), Ki-67 90%, HER-2 negative (ratio 1.05).  T1cN0 ?  ?(4) chemotherapy consisting of  cyclophosphamide and doxorubicin in dose dense fashion ?4 started 01/17/17, completed 03/07/2017,followed by Abraxane weekly ?12. ?            (a)  cycle 3 AC delayed because of febrile neutropenia ?            (b) febrile neutropenia also occurred after cycle 4 AC ?            (c) Cycle 2 and 4 of Abraxane delayed because of intolerance ?            (d) Abraxane discontinued after 4 doses secondary to poor marrow response; last dose 05/16/2017 ?  ?(5) adjuvant radiation 06/10/2017 - 07/08/2017 ?Site/dose:    ?1. Left Breast: 40.05 Gy in 15 fractions  ?2. Left Breast Boost: 10 Gy in 5 fractions  ?  ?(6) tamoxifen started 08/09/2017 to take for a total of 10 years ?  ?(7) anemia of renal disease ?  ?(8) morbid obesity ?            (a) baseline weight 2008 was 252 pounds ? ?CURRENT THERAPY: Tamoxifen daily ? ?INTERVAL HISTORY: ?Abigail Yoder 56 y.o. female returns for follow-up of her history of estrogen positive breast cancer.  She continues on tamoxifen daily.  She underwent mammogram on December 07, 2021 that showed no mammographic evidence of malignancy and breast density category A. ? ?She continues on Tamoxifen with no concerns.  She sees her PCP regularly.  She is not exercising.  Otherwise she is feeling well and has no significant concerns today.   ? ? ?Patient Active Problem List  ? Diagnosis Date Noted  ? CKD stage 3 secondary  to diabetes ([place]) 01/04/2020  ? Morbid obesity with BMI of 40.0-44.9, adult ([place]) 03/18/2018  ? Genetic testing 12/18/2016  ? Family history of breast cancer   ? Malignant neoplasm of upper-inner quadrant of left breast in female, estrogen receptor positive ([place]) 11/27/2016  ? 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 Essential hypertension 08/01/2007  ? Diabetes mellitus type 2, noninsulin dependent ([place]) 06/30/2007  ? Iron deficiency anemia 06/30/2007  ? ? ?has no active allergies. ? ?MEDICAL HISTORY: ?Past Medical History:  ?Diagnosis Date  ? Anemia   ?  Diabetes mellitus   ? Family history of breast cancer   ? History of radiation therapy 06/10/17- 07/08/17  ? Left Breast 2.67 Gy in 15 fractions. Left Breast boost 2 Gy in 5 fractions  ? Hypertension   ? Obesity   ? ? ?SURGICAL HISTORY: ?Past Surgical History:  ?Procedure Laterality Date  ? APPENDECTOMY    ? BREAST LUMPECTOMY WITH RADIOACTIVE SEED AND SENTINEL LYMPH NODE BIOPSY Left 12/31/2016  ? Procedure: BREAST LUMPECTOMY WITH RADIOACTIVE SEED AND SENTINEL LYMPH NODE BIOPSY;  Surgeon: Alphonsa Overall, MD;  Location: [facility];  Service: General;  Laterality: Left;  ? CERVICAL SPINE SURGERY    ? CESAREAN SECTION    ? IR REMOVAL TUN ACCESS W/ PORT W/O FL MOD SED  10/18/2017  ? KNEE SURGERY Left   ? PORTACATH PLACEMENT N/A 12/31/2016  ? Procedure: INSERTION PORT-A-CATH WITH [place];  Surgeon: Alphonsa Overall, MD;  Location: [facility];  Service: General;  Laterality: N/A;  ? TUBAL LIGATION    ? ? ?SOCIAL HISTORY: ?Social History  ? ?Socioeconomic History  ? Marital status: Legally Separated  ?  Spouse name: Not on file  ? Number of children: 2  ? Years of education: Not on file  ? Highest education level: Not on file  ?Occupational History  ? Not on file  ?Tobacco Use  ? Smoking status: Former  ?  Types: Cigarettes  ?  Quit date: 11/19/1994  ?  Years since quitting: 27.3  ? Smokeless tobacco: Never  ?Substance and Sexual Activity  ? Alcohol use: No  ? Drug use: No  ? Sexual activity: Yes  ?  Birth control/protection: Surgical  ?Other Topics Concern  ? Not on file  ?Social History Narrative  ? Not on file  ? ?Social Determinants of Health  ? ?Financial Resource Strain: Not on file  ?Food Insecurity: Not on file  ?Transportation Needs: Not on file  ?Physical Activity: Not on file  ?Stress: Not on file  ?Social Connections: Not on file  ?Intimate Partner Violence: Not on file  ? ? ?FAMILY HISTORY: ?Family History  ?Problem Relation Age of Onset  ? Diabetes Mother   ? Arthritis Mother   ? Hypertension  Father   ? Heart disease Father   ? Hyperlipidemia Father   ? Breast cancer Sister 70  ?     came back 17 years later  ? Stroke Maternal Uncle   ? Stroke Maternal Grandmother   ? ? ?Review of Systems  ?Constitutional:  Negative for appetite change, chills, fatigue, fever and unexpected weight change.  ?HENT:   Negative for hearing loss, lump/mass and trouble swallowing.   ?Eyes:  Negative for eye problems and icterus.  ?Respiratory:  Negative for chest tightness, cough and shortness of breath.   ?Cardiovascular:  Negative for chest pain, leg swelling and palpitations.  ?Gastrointestinal:  Negative for abdominal distention, abdominal pain, constipation, diarrhea,  nausea and vomiting.  ?Endocrine: Negative for hot flashes.  ?Genitourinary:  Negative for difficulty urinating.   ?Musculoskeletal:  Negative for arthralgias.  ?Skin:  Negative for itching and rash.  ?Neurological:  Negative for dizziness, extremity weakness, headaches and numbness.  ?Hematological:  Negative for adenopathy. Does not bruise/bleed easily.  ?Psychiatric/Behavioral:  Negative for depression. The patient is not nervous/anxious.    ? ? ?PHYSICAL EXAMINATION ? ?ECOG PERFORMANCE STATUS: 1 - Symptomatic but completely ambulatory ? ?Vitals:  ? 03/08/22 1201  ?BP: (!) 165/77  ?Pulse: 84  ?Resp: 18  ?Temp: (!) 97.2 ?F (36.2 ?C)  ?SpO2: 100%  ? ? ?Physical Exam ?Constitutional:   ?   General: She is not in acute distress. ?   Appearance: Normal appearance. She is not toxic-appearing.  ?HENT:  ?   Head: Normocephalic and atraumatic.  ?Eyes:  ?   General: No scleral icterus. ?Cardiovascular:  ?   Rate and Rhythm: Normal rate and regular rhythm.  ?   Pulses: Normal pulses.  ?   Heart sounds: Normal heart sounds.  ?Pulmonary:  ?   Effort: Pulmonary effort is normal.  ?   Breath sounds: Normal breath sounds.  ?Chest:  ?   Comments: Left breast s/p lumpectomy and radiation no sign of local recurrence, right breast benign ?Abdominal:  ?   General: Abdomen is  flat. Bowel sounds are normal. There is no distension.  ?   Palpations: Abdomen is soft.  ?   Tenderness: There is no abdominal tenderness.  ?Musculoskeletal:     ?   General: No swelling.  ?   Cervical back: Nec

## 2022-03-09 ENCOUNTER — Telehealth: Payer: Self-pay | Admitting: Adult Health

## 2022-03-09 NOTE — Telephone Encounter (Signed)
Scheduled follow-up appointment per 4/20 los. Patient is aware. 

## 2022-03-10 NOTE — Assessment & Plan Note (Signed)
Abigail Yoder is a 55 year old woman with stage IA ER/PR positive breast cancer diagnosed in 11/2016 s/p left lumpectomy, adjuvant chemotherapy, adjuvant radiation, and adjuvant anti-estrogen therapy with Tamoxifen that was started in 07/2017.   ? ?1. Stage IA breast cancer: She has no clinical or radiographic sign of breast cancer recurrence.  She will continue with annual mammograms, next due 11/2021.  She is tolerating Tamoxifen well.  I reviewed that she should call us with concerns and side effects, highlighting extremity swelling and vaginal bleeding.  ? ?2. We discussed healthy diet and exercise to aid with recurrence risk reduction.  I encouraged her to stay up to date with her PCP appointments and health maintenance.   ?Abigail Yoder will return in 1 year for labs and f/u.   ? ? ?

## 2022-03-16 DIAGNOSIS — I10 Essential (primary) hypertension: Secondary | ICD-10-CM | POA: Diagnosis not present

## 2022-03-16 DIAGNOSIS — N184 Chronic kidney disease, stage 4 (severe): Secondary | ICD-10-CM | POA: Diagnosis not present

## 2022-03-19 DIAGNOSIS — N184 Chronic kidney disease, stage 4 (severe): Secondary | ICD-10-CM | POA: Diagnosis not present

## 2022-03-19 DIAGNOSIS — E119 Type 2 diabetes mellitus without complications: Secondary | ICD-10-CM | POA: Diagnosis not present

## 2022-03-19 DIAGNOSIS — I10 Essential (primary) hypertension: Secondary | ICD-10-CM | POA: Diagnosis not present

## 2022-03-19 DIAGNOSIS — N390 Urinary tract infection, site not specified: Secondary | ICD-10-CM | POA: Diagnosis not present

## 2022-04-18 ENCOUNTER — Emergency Department (HOSPITAL_COMMUNITY)
Admission: EM | Admit: 2022-04-18 | Discharge: 2022-04-19 | Disposition: A | Discharge status: Home / Self Care | Payer: BC Managed Care – PPO | Attending: Emergency Medicine | Admitting: Emergency Medicine

## 2022-04-18 ENCOUNTER — Other Ambulatory Visit: Payer: Self-pay

## 2022-04-18 DIAGNOSIS — E1122 Type 2 diabetes mellitus with diabetic chronic kidney disease: Secondary | ICD-10-CM | POA: Diagnosis not present

## 2022-04-18 DIAGNOSIS — R7989 Other specified abnormal findings of blood chemistry: Secondary | ICD-10-CM | POA: Diagnosis not present

## 2022-04-18 DIAGNOSIS — Z853 Personal history of malignant neoplasm of breast: Secondary | ICD-10-CM | POA: Insufficient documentation

## 2022-04-18 DIAGNOSIS — R739 Hyperglycemia, unspecified: Secondary | ICD-10-CM

## 2022-04-18 DIAGNOSIS — Z79899 Other long term (current) drug therapy: Secondary | ICD-10-CM | POA: Insufficient documentation

## 2022-04-18 DIAGNOSIS — N189 Chronic kidney disease, unspecified: Secondary | ICD-10-CM

## 2022-04-18 DIAGNOSIS — Z7984 Long term (current) use of oral hypoglycemic drugs: Secondary | ICD-10-CM | POA: Insufficient documentation

## 2022-04-18 DIAGNOSIS — I129 Hypertensive chronic kidney disease with stage 1 through stage 4 chronic kidney disease, or unspecified chronic kidney disease: Secondary | ICD-10-CM | POA: Insufficient documentation

## 2022-04-18 DIAGNOSIS — E1165 Type 2 diabetes mellitus with hyperglycemia: Secondary | ICD-10-CM | POA: Diagnosis not present

## 2022-04-18 DIAGNOSIS — E7800 Pure hypercholesterolemia, unspecified: Secondary | ICD-10-CM | POA: Diagnosis not present

## 2022-04-18 DIAGNOSIS — Z794 Long term (current) use of insulin: Secondary | ICD-10-CM | POA: Diagnosis not present

## 2022-04-18 LAB — BASIC METABOLIC PANEL
Anion gap: 11 (ref 5–15)
BUN: 47 mg/dL — ABNORMAL HIGH (ref 6–20)
CO2: 18 mmol/L — ABNORMAL LOW (ref 22–32)
Calcium: 8.8 mg/dL — ABNORMAL LOW (ref 8.9–10.3)
Chloride: 110 mmol/L (ref 98–111)
Creatinine, Ser: 3.78 mg/dL — ABNORMAL HIGH (ref 0.44–1.00)
GFR, Estimated: 13 mL/min — ABNORMAL LOW (ref >60)
Glucose, Bld: 224 mg/dL — ABNORMAL HIGH (ref 70–99)
Potassium: 4.5 mmol/L (ref 3.5–5.1)
Sodium: 139 mmol/L (ref 135–145)

## 2022-04-18 LAB — CBC WITH DIFFERENTIAL/PLATELET
Abs Immature Granulocytes: 0.01 10*3/uL (ref 0.00–0.07)
Basophils Absolute: 0 10*3/uL (ref 0.0–0.1)
Basophils Relative: 0 %
Eosinophils Absolute: 0.1 10*3/uL (ref 0.0–0.5)
Eosinophils Relative: 2 %
HCT: 32.1 % — ABNORMAL LOW (ref 36.0–46.0)
Hemoglobin: 10.3 g/dL — ABNORMAL LOW (ref 12.0–15.0)
Immature Granulocytes: 0 %
Lymphocytes Relative: 24 %
Lymphs Abs: 1.3 10*3/uL (ref 0.7–4.0)
MCH: 30.2 pg (ref 26.0–34.0)
MCHC: 32.1 g/dL (ref 30.0–36.0)
MCV: 94.1 fL (ref 80.0–100.0)
Monocytes Absolute: 0.4 10*3/uL (ref 0.1–1.0)
Monocytes Relative: 8 %
Neutro Abs: 3.5 10*3/uL (ref 1.7–7.7)
Neutrophils Relative %: 66 %
Platelets: 186 10*3/uL (ref 150–400)
RBC: 3.41 MIL/uL — ABNORMAL LOW (ref 3.87–5.11)
RDW: 15.1 % (ref 11.5–15.5)
WBC: 5.3 10*3/uL (ref 4.0–10.5)
nRBC: 0 % (ref 0.0–0.2)

## 2022-04-18 LAB — CK: Total CK: 338 U/L — ABNORMAL HIGH (ref 38–234)

## 2022-04-18 MED ORDER — LACTATED RINGERS IV BOLUS: 1000.0000 mL | Freq: Once | INTRAVENOUS | Status: DC

## 2022-04-18 MED ORDER — LACTATED RINGERS IV BOLUS
1000.0000 mL | Freq: Once | INTRAVENOUS | Status: AC
  Administered 2022-04-18: 1000 mL via INTRAVENOUS

## 2022-04-18 NOTE — ED Triage Notes (Signed)
Pt was sent here by PCP for increased Creatinine level (4.01) on her lab work done today. Pt has no complaints, states she feels fine. She has been exercising more and increasing her water intake. She reports last time her creatinine was high was when she was dehydrated w/ covid in November.

## 2022-04-18 NOTE — ED Provider Triage Note (Signed)
Emergency Medicine Provider Triage Evaluation Note  Abigail Yoder , a 56 y.o. female  was evaluated in triage.  Pt complains of elevated creatinine levels.  Patient was evaluated at an outside facility and was called and told that her creatinine was over 4.  Patient states that she has no symptoms at this time.  Denies shortness of breath, chest pain, changes in urination  Review of Systems  Positive: Increased creatinine Negative: Chest pain, shortness of breath  Physical Exam  BP (!) 151/61 (BP Location: Right Arm)   Pulse 99   Temp 99.2 F (37.3 C) (Oral)   Resp (!) 22   SpO2 100%  Gen:   Awake, no distress   Resp:  Normal effort  MSK:   Moves extremities without difficulty  Other:    Medical Decision Making  Medically screening exam initiated at 7:05 PM.  Appropriate orders placed.  Abigail Yoder was informed that the remainder of the evaluation will be completed by another provider, this initial triage assessment does not replace that evaluation, and the importance of remaining in the ED until their evaluation is complete.     Dorothyann Peng, PA-C 04/18/22 1906

## 2022-04-18 NOTE — ED Provider Notes (Addendum)
[facility] EMERGENCY DEPARTMENT Provider Note   CSN: [id number] Arrival date & time: 04/18/22  1844     History  Chief Complaint  Patient presents with   abnormal labs    Abigail Yoder is a 56 y.o. female.  HPI 56 year old female with a history of DM type II, hypertension, prior breast cancer, obesity presents to the ER after being sent by her PCP with a concern for an elevated creatinine.  Patient states that she had some lab work done this morning by her PCP and her creatinine was 4.01.  She does have history of CKD and is followed by nephrology.  She denies any decrease in urine output, states she drinks almost a gallon of water a day and has been in her normal state of health.  She states that she has started exercising more and walking a track 3 times a week.  She does state that she has some leg cramps with that but she attributes that to her new exercise regimen.  She denies any fevers, chills.  Denies any chest pain or shortness of breath.    Home Medications Prior to Admission medications   Medication Sig Start Date End Date Taking? Authorizing Provider  amLODipine (NORVASC) 10 MG tablet Take 1 tablet (10 mg total) by mouth daily. 01/04/20   [name], Virgie Dad, MD  carvedilol (COREG) 25 MG tablet Take 25 mg by mouth 2 (two) times daily. 02/03/20   [provider]  Dapagliflozin Propanediol (FARXIGA PO) Take 10 mg by mouth daily.    [provider]  hydrALAZINE (APRESOLINE) 25 MG tablet Take 25 mg by mouth 3 (three) times daily. 02/01/20   [provider]  NOVOLOG MIX 70/30 FLEXPEN (70-30) 100 UNIT/ML FlexPen Inject 35 Units into the skin 2 (two) times daily with a meal. 03/14/20   [provider]  sodium bicarbonate 650 MG tablet Take 650 mg by mouth 3 (three) times daily. 02/01/20   [provider]  tamoxifen (NOLVADEX) 20 MG tablet Take 1 tablet (20 mg total) by mouth daily. 03/08/22   Gardenia Phlegm, NP   prochlorperazine (COMPAZINE) 10 MG tablet Take 1 tablet (10 mg total) by mouth every 6 (six) hours as needed (Nausea or vomiting). 12/14/16 03/22/17  [name], Virgie Dad, MD      Allergies    Patient has no known allergies.    Review of Systems   Review of Systems Ten systems reviewed and are negative for acute change, except as noted in the HPI.   Physical Exam Updated Vital Signs BP (!) 145/90   Pulse 82   Temp 99.2 F (37.3 C) (Oral)   Resp 17   SpO2 98%  Physical Exam Vitals and nursing note reviewed.  Constitutional:      General: She is not in acute distress.    Appearance: She is well-developed.  HENT:     Head: Normocephalic and atraumatic.  Eyes:     Conjunctiva/sclera: Conjunctivae normal.  Cardiovascular:     Rate and Rhythm: Normal rate and regular rhythm.     Heart sounds: No murmur heard. Pulmonary:     Effort: Pulmonary effort is normal. No respiratory distress.     Breath sounds: Normal breath sounds.  Abdominal:     Palpations: Abdomen is soft.     Tenderness: There is no abdominal tenderness.  Musculoskeletal:        General: No swelling.     Cervical back: Neck supple.  Skin:  General: Skin is warm and dry.     Capillary Refill: Capillary refill takes less than 2 seconds.  Neurological:     Mental Status: She is alert.  Psychiatric:        Mood and Affect: Mood normal.    ED Results / Procedures / Treatments   Labs (all labs ordered are listed, but only abnormal results are displayed) Labs Reviewed  BASIC METABOLIC PANEL - Abnormal; Notable for the following components:      Result Value   CO2 18 (*)    Glucose, Bld 224 (*)    BUN 47 (*)    Creatinine, Ser 3.78 (*)    Calcium 8.8 (*)    GFR, Estimated 13 (*)    All other components within normal limits  CBC WITH DIFFERENTIAL/PLATELET - Abnormal; Notable for the following components:   RBC 3.41 (*)    Hemoglobin 10.3 (*)    HCT 32.1 (*)    All other components within normal limits   CK    EKG EKG Interpretation  Date/Time:  Wednesday Apr 18 2022 19:12:28 EDT Ventricular Rate:  81 PR Interval:  134 QRS Duration: 72 QT Interval:  370 QTC Calculation: 429 R Axis:   44 Text Interpretation: Normal sinus rhythm Nonspecific ST and T wave abnormality No significant change since last tracing When compared with ECG of 07-Oct-2021 14:50, PREVIOUS ECG IS PRESENT Confirmed by Blanchie Dessert [phone]) on 04/18/2022 10:39:33 PM  Radiology No results found.  Procedures Procedures    Medications Ordered in ED Medications  lactated ringers bolus 1,000 mL (1,000 mLs Intravenous New Bag/Given 04/18/22 2248)    ED Course/ Medical Decision Making/ A&P                           Medical Decision Making Amount and/or Complexity of Data Reviewed Labs: ordered.  56 year old female presented to the ER after being told to come to the ER by her PCP with concerns for an elevated creatinine.  I personally reviewed and interpreted her lab work, per chart review, her creatinine of 3.78 per her lab work here is consistent with her baseline creatinine.  The patient does not have any electrolyte abnormalities that would suggest worsening renal failure, the patient denies any decrease in urine output and overall is well-appearing and feels well.  Her glucose was 224 and CO2 is 18, however normal anion gap not consistent with DKA.  CK 338, not consistent with rhabdo. Hypocarbia likely secondary to renal failure.  Her CBC does not have a leukocytosis and she denies any UTI symptoms.  I do not think she needs any additional imaging/evaluation or hospital admission given this.  I encouraged her to maintain better control of her blood sugars, she states that she has not taken her insulin yet tonight due to being in the ER.  She was given a liter of LR, recommend discharge with follow-up with nephrology.  We discussed return precautions.  The patient is agreeable to this.  Stable for  discharge.   Case discussed with Dr. Maryan Rued is agreeable to the above plan and disposition. Final Clinical Impression(s) / ED Diagnoses Final diagnoses:  Chronic kidney disease, unspecified CKD stage    Rx / DC Orders ED Discharge Orders     None             Garald Balding, PA-C 04/19/22 0001    Blanchie Dessert, MD 04/26/22 1641

## 2022-04-18 NOTE — Discharge Instructions (Addendum)
Your work-up today was overall reassuring, your creatinine does not appear to be much different than your baseline.  Continue to drink plenty of fluids.  Make sure to take your insulin as directed.  Please make sure to follow-up with your nephrologist.  Return to the ER for any new or worsening symptoms.

## 2022-04-25 DIAGNOSIS — Z0000 Encounter for general adult medical examination without abnormal findings: Secondary | ICD-10-CM | POA: Diagnosis not present

## 2022-05-18 DIAGNOSIS — N189 Chronic kidney disease, unspecified: Secondary | ICD-10-CM | POA: Diagnosis not present

## 2022-05-18 DIAGNOSIS — I10 Essential (primary) hypertension: Secondary | ICD-10-CM | POA: Diagnosis not present

## 2022-05-18 DIAGNOSIS — E1169 Type 2 diabetes mellitus with other specified complication: Secondary | ICD-10-CM | POA: Diagnosis not present

## 2022-05-18 DIAGNOSIS — E7800 Pure hypercholesterolemia, unspecified: Secondary | ICD-10-CM | POA: Diagnosis not present

## 2022-07-27 DIAGNOSIS — R809 Proteinuria, unspecified: Secondary | ICD-10-CM | POA: Diagnosis not present

## 2022-07-27 DIAGNOSIS — N184 Chronic kidney disease, stage 4 (severe): Secondary | ICD-10-CM | POA: Diagnosis not present

## 2022-07-30 DIAGNOSIS — E875 Hyperkalemia: Secondary | ICD-10-CM | POA: Diagnosis not present

## 2022-07-30 DIAGNOSIS — N184 Chronic kidney disease, stage 4 (severe): Secondary | ICD-10-CM | POA: Diagnosis not present

## 2022-07-30 DIAGNOSIS — N390 Urinary tract infection, site not specified: Secondary | ICD-10-CM | POA: Diagnosis not present

## 2022-07-30 DIAGNOSIS — I10 Essential (primary) hypertension: Secondary | ICD-10-CM | POA: Diagnosis not present

## 2022-08-09 DIAGNOSIS — E119 Type 2 diabetes mellitus without complications: Secondary | ICD-10-CM | POA: Diagnosis not present

## 2022-08-09 DIAGNOSIS — D649 Anemia, unspecified: Secondary | ICD-10-CM | POA: Diagnosis not present

## 2022-08-09 DIAGNOSIS — Z1211 Encounter for screening for malignant neoplasm of colon: Secondary | ICD-10-CM | POA: Diagnosis not present

## 2022-08-09 DIAGNOSIS — N184 Chronic kidney disease, stage 4 (severe): Secondary | ICD-10-CM | POA: Diagnosis not present

## 2022-08-10 DIAGNOSIS — D649 Anemia, unspecified: Secondary | ICD-10-CM | POA: Diagnosis not present

## 2022-08-10 DIAGNOSIS — N184 Chronic kidney disease, stage 4 (severe): Secondary | ICD-10-CM | POA: Diagnosis not present

## 2022-08-16 DIAGNOSIS — Z1211 Encounter for screening for malignant neoplasm of colon: Secondary | ICD-10-CM | POA: Diagnosis not present

## 2022-08-16 DIAGNOSIS — Z1212 Encounter for screening for malignant neoplasm of rectum: Secondary | ICD-10-CM | POA: Diagnosis not present

## 2022-08-23 LAB — COLOGUARD: COLOGUARD: POSITIVE — AB

## 2022-09-06 DIAGNOSIS — N184 Chronic kidney disease, stage 4 (severe): Secondary | ICD-10-CM | POA: Diagnosis not present

## 2022-09-06 DIAGNOSIS — R195 Other fecal abnormalities: Secondary | ICD-10-CM | POA: Diagnosis not present

## 2022-10-04 DIAGNOSIS — D120 Benign neoplasm of cecum: Secondary | ICD-10-CM | POA: Diagnosis not present

## 2022-10-04 DIAGNOSIS — D125 Benign neoplasm of sigmoid colon: Secondary | ICD-10-CM | POA: Diagnosis not present

## 2022-10-04 DIAGNOSIS — K644 Residual hemorrhoidal skin tags: Secondary | ICD-10-CM | POA: Diagnosis not present

## 2022-10-04 DIAGNOSIS — R195 Other fecal abnormalities: Secondary | ICD-10-CM | POA: Diagnosis not present

## 2022-10-04 DIAGNOSIS — D490 Neoplasm of unspecified behavior of digestive system: Secondary | ICD-10-CM | POA: Diagnosis not present

## 2022-10-04 DIAGNOSIS — K648 Other hemorrhoids: Secondary | ICD-10-CM | POA: Diagnosis not present

## 2022-10-04 DIAGNOSIS — C184 Malignant neoplasm of transverse colon: Secondary | ICD-10-CM | POA: Diagnosis not present

## 2022-10-04 DIAGNOSIS — D122 Benign neoplasm of ascending colon: Secondary | ICD-10-CM | POA: Diagnosis not present

## 2022-10-05 ENCOUNTER — Other Ambulatory Visit: Payer: Self-pay | Admitting: Gastroenterology

## 2022-10-05 DIAGNOSIS — C189 Malignant neoplasm of colon, unspecified: Secondary | ICD-10-CM

## 2022-10-10 ENCOUNTER — Ambulatory Visit
Admission: RE | Admit: 2022-10-10 | Discharge: 2022-10-10 | Disposition: A | Discharge status: Home / Self Care | Payer: BC Managed Care – PPO | Source: Ambulatory Visit | Attending: Gastroenterology | Admitting: Gastroenterology

## 2022-10-10 ENCOUNTER — Other Ambulatory Visit: Payer: Self-pay | Admitting: Gastroenterology

## 2022-10-10 DIAGNOSIS — C189 Malignant neoplasm of colon, unspecified: Secondary | ICD-10-CM

## 2022-10-10 DIAGNOSIS — I700 Atherosclerosis of aorta: Secondary | ICD-10-CM | POA: Diagnosis not present

## 2022-10-10 DIAGNOSIS — K449 Diaphragmatic hernia without obstruction or gangrene: Secondary | ICD-10-CM | POA: Diagnosis not present

## 2022-10-10 DIAGNOSIS — Z853 Personal history of malignant neoplasm of breast: Secondary | ICD-10-CM | POA: Diagnosis not present

## 2022-10-10 DIAGNOSIS — K6389 Other specified diseases of intestine: Secondary | ICD-10-CM | POA: Diagnosis not present

## 2022-10-15 DIAGNOSIS — N1831 Chronic kidney disease, stage 3a: Secondary | ICD-10-CM | POA: Diagnosis not present

## 2022-10-15 DIAGNOSIS — E1129 Type 2 diabetes mellitus with other diabetic kidney complication: Secondary | ICD-10-CM | POA: Diagnosis not present

## 2022-10-15 DIAGNOSIS — E1165 Type 2 diabetes mellitus with hyperglycemia: Secondary | ICD-10-CM | POA: Diagnosis not present

## 2022-10-15 DIAGNOSIS — I10 Essential (primary) hypertension: Secondary | ICD-10-CM | POA: Diagnosis not present

## 2022-10-25 DIAGNOSIS — E1129 Type 2 diabetes mellitus with other diabetic kidney complication: Secondary | ICD-10-CM | POA: Diagnosis not present

## 2022-10-25 DIAGNOSIS — D509 Iron deficiency anemia, unspecified: Secondary | ICD-10-CM | POA: Diagnosis not present

## 2022-10-29 ENCOUNTER — Ambulatory Visit: Payer: Self-pay | Admitting: Surgery

## 2022-10-29 DIAGNOSIS — R739 Hyperglycemia, unspecified: Secondary | ICD-10-CM

## 2022-10-29 DIAGNOSIS — C183 Malignant neoplasm of hepatic flexure: Secondary | ICD-10-CM | POA: Diagnosis not present

## 2022-10-29 DIAGNOSIS — Z853 Personal history of malignant neoplasm of breast: Secondary | ICD-10-CM | POA: Diagnosis not present

## 2022-10-29 DIAGNOSIS — E669 Obesity, unspecified: Secondary | ICD-10-CM | POA: Diagnosis not present

## 2022-10-29 DIAGNOSIS — Z9229 Personal history of other drug therapy: Secondary | ICD-10-CM | POA: Diagnosis not present

## 2022-11-01 DIAGNOSIS — Z01419 Encounter for gynecological examination (general) (routine) without abnormal findings: Secondary | ICD-10-CM | POA: Diagnosis not present

## 2022-11-01 DIAGNOSIS — Z1151 Encounter for screening for human papillomavirus (HPV): Secondary | ICD-10-CM | POA: Diagnosis not present

## 2022-11-05 ENCOUNTER — Encounter (HOSPITAL_COMMUNITY): Payer: Self-pay

## 2022-11-05 ENCOUNTER — Encounter (HOSPITAL_COMMUNITY): Payer: Self-pay | Admitting: Surgery

## 2022-11-05 NOTE — Patient Instructions (Addendum)
SURGICAL WAITING ROOM VISITATION Patients having surgery or a procedure may have no more than 2 support people in the waiting area - these visitors may rotate.   Children under the age of 68 must have an adult with them who is not the patient. If the patient needs to stay at the hospital during part of their recovery, the visitor guidelines for inpatient rooms apply. Pre-op nurse will coordinate an appropriate time for 1 support person to accompany patient in pre-op.  This support person may not rotate.    Please refer to the [facility] website for the visitor guidelines for Inpatients (after your surgery is over and you are in a regular room).      Your procedure is scheduled on: 11-07-22   Report to [facility] Main Entrance    Report to admitting at 6:15 AM   Call this number if you have problems the morning of surgery [phone]   Follow a clear liquid diet the day of prep to prevent dehydration   After Midnight you may have the following liquids until 5:30 AM DAY OF SURGERY  Water Non-Citrus Juices (without pulp, NO RED) Carbonated Beverages Black Coffee (NO MILK/CREAM OR CREAMERS, sugar ok)  Clear Tea (NO MILK/CREAM OR CREAMERS, sugar ok) regular and decaf                             Plain Jell-O (NO RED)                                           Fruit ices (not with fruit pulp, NO RED)                                     Popsicles (NO RED)                                                               Sports drinks like Gatorade (NO RED)  Drink 2 Pre-Surgery G2 the evening before surgery                   The day of surgery:  Drink ONE (1) Pre-Surgery G2 at 5:30 AM the morning of surgery. Drink in one sitting. Do not sip.  This drink was given to you during your hospital  pre-op appointment visit. Nothing else to drink after completing the Pre-Surgery G2.          If you have questions, please contact your surgeon's office.   FOLLOW BOWEL PREP AND ANY  ADDITIONAL PRE OP INSTRUCTIONS YOU RECEIVED FROM YOUR SURGEON'S OFFICE!!!      -Clear liquids day of prep    -Bisacodyl 20 mg - Give with water the day prior to surgery.   - Miralax 255g -  Mix with 64 oz Gatorade/Powerade.  Drink gradually over the next few hours (8 oz glass every 15-30 minutes) until gone the day prior to surgery.   -Neomycin 1000 mg - At 2 pm, 3 pm and 10 pm after Miralax  bowel prep the day prior to  surgery.   -Metronidazole 1000 mg - At 2 pm, 3 pm and 10 pm after Miralax  bowel prep the day prior to surgery.   Oral Hygiene is also important to reduce your risk of infection.                                    Remember - BRUSH YOUR TEETH THE MORNING OF SURGERY WITH YOUR REGULAR TOOTHPASTE   Do NOT smoke after Midnight   Take these medicines the morning of surgery with A SIP OF WATER:   Amlodipine  Carvedilol  Hydralazine  How to Manage Your Diabetes Before and After Surgery  Why is it important to control my blood sugar before and after surgery? Improving blood sugar levels before and after surgery helps healing and can limit problems. A way of improving blood sugar control is eating a healthy diet by:  Eating less sugar and carbohydrates  Increasing activity/exercise  Talking with your doctor about reaching your blood sugar goals High blood sugars (greater than 180 mg/dL) can raise your risk of infections and slow your recovery, so you will need to focus on controlling your diabetes during the weeks before surgery. Make sure that the doctor who takes care of your diabetes knows about your planned surgery including the date and location.  How do I manage my blood sugar before surgery? Check your blood sugar at least 4 times a day, starting 2 days before surgery, to make sure that the level is not too high or low. Check your blood sugar the morning of your surgery when you wake up and every 2 hours until you get to the Short Stay unit. If your blood sugar is  less than 70 mg/dL, you will need to treat for low blood sugar: Do not take insulin. Treat a low blood sugar (less than 70 mg/dL) with  cup of clear juice (cranberry or apple), 4 glucose tablets, OR glucose gel. Recheck blood sugar in 15 minutes after treatment (to make sure it is greater than 70 mg/dL). If your blood sugar is not greater than 70 mg/dL on recheck, call [phone] for further instructions. Report your blood sugar to the short stay nurse when you get to Short Stay.  If you are admitted to the hospital after surgery: Your blood sugar will be checked by the staff and you will probably be given insulin after surgery (instead of oral diabetes medicines) to make sure you have good blood sugar levels. The goal for blood sugar control after surgery is 80-180 mg/dL.   WHAT DO I DO ABOUT MY DIABETES MEDICATION?  Do not take oral diabetes medicines (pills) the morning of surgery.  THE NIGHT BEFORE SURGERY:  Take 24 units of Novolog 70/30 in the evening.  THE MORNING OF SURGERY:  Do not take Novolog 70/30.  DO NOT TAKE THE FOLLOWING 7 DAYS PRIOR TO SURGERY: Ozempic, Wegovy, Rybelsus (Semaglutide), Byetta (exenatide), Bydureon (exenatide ER), Victoza, Saxenda (liraglutide), or Trulicity (dulaglutide) Mounjaro (Tirzepatide) Adlyxin (Lixisenatide), Polyethylene Glycol Loxenatide.  Reviewed and Endorsed by [facility] Patient Education Committee, August 2015  Bring CPAP mask and tubing day of surgery.                              You may not have any metal on your body including hair pins, jewelry, and body piercing  Do not wear make-up, lotions, powders, perfumes or deodorant  Do not wear nail polish including gel and S&S, artificial/acrylic nails, or any other type of covering on natural nails including finger and toenails. If you have artificial nails, gel coating, etc. that needs to be removed by a nail salon please have this removed prior to surgery or surgery may need  to be canceled/ delayed if the surgeon/ anesthesia feels like they are unable to be safely monitored.   Do not shave  48 hours prior to surgery.    Do not bring valuables to the hospital. [place].   Contacts, dentures or bridgework may not be worn into surgery.   Bring small overnight bag day of surgery.   DO NOT [place]. PHARMACY WILL DISPENSE MEDICATIONS LISTED ON YOUR MEDICATION LIST TO YOU DURING YOUR ADMISSION [place]!     Special Instructions: Bring a copy of your healthcare power of attorney and living will documents the day of surgery if you haven't scanned them before.              Please read over the following fact sheets you were given: IF [place]  If you received a COVID test during your pre-op visit  it is requested that you wear a mask when out in public, stay away from anyone that may not be feeling well and notify your surgeon if you develop symptoms. If you test positive for Covid or have been in contact with anyone that has tested positive in the last 10 days please notify you surgeon.  [place] - Preparing for Surgery Before surgery, you can play an important role.  Because skin is not sterile, your skin needs to be as free of germs as possible.  You can reduce the number of germs on your skin by washing with CHG (chlorahexidine gluconate) soap before surgery.  CHG is an antiseptic cleaner which kills germs and bonds with the skin to continue killing germs even after washing. Please DO NOT use if you have an allergy to CHG or antibacterial soaps.  If your skin becomes reddened/irritated stop using the CHG and inform your nurse when you arrive at Short Stay. Do not shave (including legs and underarms) for at least 48 hours prior to the first CHG shower.  You may shave your face/neck.  Please follow these instructions  carefully:  1.  Shower with CHG Soap the night before surgery and the  morning of surgery.  2.  If you choose to wash your hair, wash your hair first as usual with your normal  shampoo.  3.  After you shampoo, rinse your hair and body thoroughly to remove the shampoo.                             4.  Use CHG as you would any other liquid soap.  You can apply chg directly to the skin and wash.  Gently with a scrungie or clean washcloth.  5.  Apply the CHG Soap to your body ONLY FROM THE NECK DOWN.   Do   not use on face/ open                           Wound or open sores. Avoid contact with eyes,  ears mouth and   genitals (private parts).                       Wash face,  Genitals (private parts) with your normal soap.             6.  Wash thoroughly, paying special attention to the area where your    surgery  will be performed.  7.  Thoroughly rinse your body with warm water from the neck down.  8.  DO NOT shower/wash with your normal soap after using and rinsing off the CHG Soap.                9.  Pat yourself dry with a clean towel.            10.  Wear clean pajamas.            11.  Place clean sheets on your bed the night of your first shower and do not  sleep with pets. Day of Surgery : Do not apply any lotions/deodorants the morning of surgery.  Please wear clean clothes to the hospital/surgery center.  FAILURE TO FOLLOW THESE INSTRUCTIONS MAY RESULT IN THE CANCELLATION OF YOUR SURGERY  PATIENT SIGNATURE_________________________________  NURSE SIGNATURE__________________________________  ________________________________________________________________________    Adam [place]  An incentive spirometer is a tool that can help keep your lungs clear and active. This tool measures how well you are filling your lungs with each breath. Taking long deep breaths may help reverse or decrease the chance of developing breathing (pulmonary) problems (especially infection) following: A  long period of time when you are unable to move or be active. BEFORE THE PROCEDURE  If the spirometer includes an indicator to show your best effort, your nurse or respiratory therapist will set it to a desired goal. If possible, sit up straight or lean slightly forward. Try not to slouch. Hold the incentive spirometer in an upright position. INSTRUCTIONS FOR USE  Sit on the edge of your bed if possible, or sit up as far as you can in bed or on a chair. Hold the incentive spirometer in an upright position. Breathe out normally. Place the mouthpiece in your mouth and seal your lips tightly around it. Breathe in slowly and as deeply as possible, raising the piston or the ball toward the top of the column. Hold your breath for 3-5 seconds or for as long as possible. Allow the piston or ball to fall to the bottom of the column. Remove the mouthpiece from your mouth and breathe out normally. Rest for a few seconds and repeat Steps 1 through 7 at least 10 times every 1-2 hours when you are awake. Take your time and take a few normal breaths between deep breaths. The spirometer may include an indicator to show your best effort. Use the indicator as a goal to work toward during each repetition. After each set of 10 deep breaths, practice coughing to be sure your lungs are clear. If you have an incision (the cut made at the time of surgery), support your incision when coughing by placing a pillow or rolled up towels firmly against it. Once you are able to get out of bed, walk around indoors and cough well. You may stop using the incentive spirometer when instructed by your caregiver.  RISKS AND COMPLICATIONS Take your time so you do not get dizzy or light-headed. If you are in pain, you may need to take or ask for pain  medication before doing incentive spirometry. It is harder to take a deep breath if you are having pain. AFTER USE Rest and breathe slowly and easily. It can be helpful to keep track of a  log of your progress. Your caregiver can provide you with a simple table to help with this. If you are using the spirometer at home, follow these instructions: [place] IF:  You are having difficultly using the spirometer. You have trouble using the spirometer as often as instructed. Your pain medication is not giving enough relief while using the spirometer. You develop fever of 100.5 F (38.1 C) or higher. SEEK IMMEDIATE MEDICAL CARE IF:  You cough up bloody sputum that had not been present before. You develop fever of 102 F (38.9 C) or greater. You develop worsening pain at or near the incision site. MAKE SURE YOU:  Understand these instructions. Will watch your condition. Will get help right away if you are not doing well or get worse. Document Released: 03/18/2007 Document Revised: 01/28/2012 Document Reviewed: 05/19/2007 Exit[employer] Patient Information 2014 [employer], [place].   ________________________________________________________________________ WHAT IS A BLOOD TRANSFUSION? Blood Transfusion Information  A transfusion is the replacement of blood or some of its parts. Blood is made up of multiple cells which provide different functions. Red blood cells carry oxygen and are used for blood loss replacement. White blood cells fight against infection. Platelets control bleeding. Plasma helps clot blood. Other blood products are available for specialized needs, such as hemophilia or other clotting disorders. BEFORE THE TRANSFUSION  Who gives blood for transfusions?  Healthy volunteers who are fully evaluated to make sure their blood is safe. This is blood bank blood. Transfusion therapy is the safest it has ever been in the practice of medicine. Before blood is taken from a donor, a complete history is taken to make sure that person has no history of diseases nor engages in risky social behavior (examples are intravenous drug use or sexual activity with multiple partners).  The donor's travel history is screened to minimize risk of transmitting infections, such as malaria. The donated blood is tested for signs of infectious diseases, such as HIV and hepatitis. The blood is then tested to be sure it is compatible with you in order to minimize the chance of a transfusion reaction. If you or a relative donates blood, this is often done in anticipation of surgery and is not appropriate for emergency situations. It takes many days to process the donated blood. RISKS AND COMPLICATIONS Although transfusion therapy is very safe and saves many lives, the main dangers of transfusion include:  Getting an infectious disease. Developing a transfusion reaction. This is an allergic reaction to something in the blood you were given. Every precaution is taken to prevent this. The decision to have a blood transfusion has been considered carefully by your caregiver before blood is given. Blood is not given unless the benefits outweigh the risks. AFTER THE TRANSFUSION Right after receiving a blood transfusion, you will usually feel much better and more energetic. This is especially true if your red blood cells have gotten low (anemic). The transfusion raises the level of the red blood cells which carry oxygen, and this usually causes an energy increase. The nurse administering the transfusion will monitor you carefully for complications. HOME CARE INSTRUCTIONS  No special instructions are needed after a transfusion. You may find your energy is better. Speak with your caregiver about any limitations on activity for underlying diseases you may have. [place]  CARE IF:  Your condition is not improving after your transfusion. You develop redness or irritation at the intravenous (IV) site. SEEK IMMEDIATE MEDICAL CARE IF:  Any of the following symptoms occur over the next 12 hours: Shaking chills. You have a temperature by mouth above 102 F (38.9 C), not controlled by medicine. Chest,  back, or muscle pain. People around you feel you are not acting correctly or are confused. Shortness of breath or difficulty breathing. Dizziness and fainting. You get a rash or develop hives. You have a decrease in urine output. Your urine turns a dark color or changes to pink, red, or brown. Any of the following symptoms occur over the next 10 days: You have a temperature by mouth above 102 F (38.9 C), not controlled by medicine. Shortness of breath. Weakness after normal activity. The white part of the eye turns yellow (jaundice). You have a decrease in the amount of urine or are urinating less often. Your urine turns a dark color or changes to pink, red, or brown. Document Released: 11/02/2000 Document Revised: 01/28/2012 Document Reviewed: 06/21/2008 [facility] Patient Information 2014 [place], [place].  _______________________________________________________________________

## 2022-11-05 NOTE — Progress Notes (Addendum)
COVID Vaccine Completed:  Yes  Date of COVID positive in last 90 days:  PCP - [name], [name] Cardiologist -   Chest x-ray - CT chest 10-10-22 Epic EKG - 04-19-22 Epic Stress Test -  ECHO - 03-06-17 Epic Cardiac Cath -  Pacemaker/ICD device last checked: Spinal Cord Stimulator:  Bowel Prep - clear liquids day of prep followed by Dulcolax, Miralax, Neomycin and Metronidazole  Sleep Study - Yes, +sleep apnea CPAP -   Fasting Blood Sugar -  Checks Blood Sugar _____ times a day  Last dose of GLP1 agonist-  N/A GLP1 instructions:  N/A   [place] Last dose of SGLT-2 inhibitors-  N/A SGLT-2 instructions: N/A   Blood Thinner Instructions: Aspirin Instructions: Last Dose:  Activity level:  Can go up a flight of stairs and perform activities of daily living without stopping and without symptoms of chest pain or shortness of breath.  Able to exercise without symptoms  Unable to go up a flight of stairs without symptoms of     Anesthesia review:   Patient denies shortness of breath, fever, cough and chest pain at PAT appointment  Patient verbalized understanding of instructions that were given to them at the PAT appointment. Patient was also instructed that they will need to review over the PAT instructions again at home before surgery.

## 2022-11-06 ENCOUNTER — Inpatient Hospital Stay (HOSPITAL_COMMUNITY)
Admission: RE | Admit: 2022-11-06 | Discharge: 2022-11-06 | Disposition: A | Discharge status: Home / Self Care | Payer: BC Managed Care – PPO | Source: Ambulatory Visit

## 2022-11-06 DIAGNOSIS — I2510 Atherosclerotic heart disease of native coronary artery without angina pectoris: Secondary | ICD-10-CM

## 2022-11-06 DIAGNOSIS — D649 Anemia, unspecified: Secondary | ICD-10-CM

## 2022-11-06 DIAGNOSIS — Z01818 Encounter for other preprocedural examination: Secondary | ICD-10-CM

## 2022-11-06 DIAGNOSIS — Z794 Long term (current) use of insulin: Secondary | ICD-10-CM

## 2022-11-06 HISTORY — DX: Personal history of malignant neoplasm of breast: Z85.3

## 2022-11-06 HISTORY — DX: Sleep apnea, unspecified: G47.30

## 2022-11-07 DIAGNOSIS — I2510 Atherosclerotic heart disease of native coronary artery without angina pectoris: Secondary | ICD-10-CM

## 2022-11-07 DIAGNOSIS — Z794 Long term (current) use of insulin: Secondary | ICD-10-CM

## 2022-11-07 DIAGNOSIS — Z01818 Encounter for other preprocedural examination: Secondary | ICD-10-CM

## 2022-11-07 DIAGNOSIS — D649 Anemia, unspecified: Secondary | ICD-10-CM

## 2022-12-11 NOTE — Patient Instructions (Addendum)
SURGICAL WAITING ROOM VISITATION Patients having surgery or a procedure may have no more than 2 support people in the waiting area - these visitors may rotate.   Children under the age of 39 must have an adult with them who is not the patient. If the patient needs to stay at the hospital during part of their recovery, the visitor guidelines for inpatient rooms apply. Pre-op nurse will coordinate an appropriate time for 1 support person to accompany patient in pre-op.  This support person may not rotate.    Please refer to the [facility] website for the visitor guidelines for Inpatients (after your surgery is over and you are in a regular room).      Your procedure is scheduled on: 12-19-22   Report to [facility] Main Entrance    Report to admitting at 6:15 AM   Call this number if you have problems the morning of surgery [phone]   Follow a clear liquid diet the day of prep to prevent dehydration   After Midnight you may have the following liquids until 5:30 AM DAY OF SURGERY  Water Non-Citrus Juices (without pulp, NO RED) Carbonated Beverages Black Coffee (NO MILK/CREAM OR CREAMERS, sugar ok)  Clear Tea (NO MILK/CREAM OR CREAMERS, sugar ok) regular and decaf                             Plain Jell-O (NO RED)                                           Fruit ices (not with fruit pulp, NO RED)                                     Popsicles (NO RED)                                                               Sports drinks like Gatorade (NO RED)  Drink 2 Pre-Surgery G2 the evening before surgery                   The day of surgery:  Drink ONE (1) Pre-Surgery G2 at 5:30 AM the morning of surgery. Drink in one sitting. Do not sip.  This drink was given to you during your hospital  pre-op appointment visit. Nothing else to drink after completing the Pre-Surgery G2.          If you have questions, please contact your surgeon's office.   FOLLOW BOWEL PREP AND ANY  ADDITIONAL PRE OP INSTRUCTIONS YOU RECEIVED FROM YOUR SURGEON'S OFFICE!!!      -Clear liquids day of prep    -Bisacodyl 20 mg - Give with water the day prior to surgery.   - Miralax 255g -  Mix with 64 oz Gatorade/Powerade.  Drink gradually over the next few hours (8 oz glass every 15-30 minutes) until gone the day prior to surgery.   -Neomycin 1000 mg - At 2 pm, 3 pm and 10 pm after Miralax  bowel prep the day prior to  surgery.   -Metronidazole 1000 mg - At 2 pm, 3 pm and 10 pm after Miralax  bowel prep the day prior to surgery.   Oral Hygiene is also important to reduce your risk of infection.                                    Remember - BRUSH YOUR TEETH THE MORNING OF SURGERY WITH YOUR REGULAR TOOTHPASTE   Do NOT smoke after Midnight   Take these medicines the morning of surgery with A SIP OF WATER:   Amlodipine  Carvedilol  Hydralazine             Tamoxifen  How to Manage Your Diabetes Before and After Surgery  Why is it important to control my blood sugar before and after surgery? Improving blood sugar levels before and after surgery helps healing and can limit problems. A way of improving blood sugar control is eating a healthy diet by:  Eating less sugar and carbohydrates  Increasing activity/exercise  Talking with your doctor about reaching your blood sugar goals High blood sugars (greater than 180 mg/dL) can raise your risk of infections and slow your recovery, so you will need to focus on controlling your diabetes during the weeks before surgery. Make sure that the doctor who takes care of your diabetes knows about your planned surgery including the date and location.  How do I manage my blood sugar before surgery? Check your blood sugar at least 4 times a day, starting 2 days before surgery, to make sure that the level is not too high or low. Check your blood sugar the morning of your surgery when you wake up and every 2 hours until you get to the Short Stay unit. If  your blood sugar is less than 70 mg/dL, you will need to treat for low blood sugar: Do not take insulin. Treat a low blood sugar (less than 70 mg/dL) with  cup of clear juice (cranberry or apple), 4 glucose tablets, OR glucose gel. Recheck blood sugar in 15 minutes after treatment (to make sure it is greater than 70 mg/dL). If your blood sugar is not greater than 70 mg/dL on recheck, call [phone] for further instructions. Report your blood sugar to the short stay nurse when you get to Short Stay.  If you are admitted to the hospital after surgery: Your blood sugar will be checked by the staff and you will probably be given insulin after surgery (instead of oral diabetes medicines) to make sure you have good blood sugar levels. The goal for blood sugar control after surgery is 80-180 mg/dL.   WHAT DO I DO ABOUT MY DIABETES MEDICATION?  Do not take oral diabetes medicines (pills) the morning of surgery.  THE NIGHT BEFORE SURGERY:  Take 70% of normal 15 units at dinner   of Novolog 70/30 in the evening. 10 units  THE MORNING OF SURGERY:  Do not take Novolog 70/30.  DO NOT TAKE THE FOLLOWING 7 DAYS PRIOR TO SURGERY: Ozempic, Wegovy, Rybelsus (Semaglutide), Byetta (exenatide), Bydureon (exenatide ER), Victoza, Saxenda (liraglutide), or Trulicity (dulaglutide) Mounjaro (Tirzepatide) Adlyxin (Lixisenatide), Polyethylene Glycol Loxenatide.  Reviewed and Endorsed by [facility] Patient Education Committee, August 2015  Bring CPAP mask and tubing day of surgery.  You may not have any metal on your body including hair pins, jewelry, and body piercing             Do not wear make-up, lotions, powders, perfumes or deodorant  Do not wear nail polish including gel and S&S, artificial/acrylic nails, or any other type of covering on natural nails including finger and toenails. If you have artificial nails, gel coating, etc. that needs to be removed by a nail salon please  have this removed prior to surgery or surgery may need to be canceled/ delayed if the surgeon/ anesthesia feels like they are unable to be safely monitored.   Do not shave  48 hours prior to surgery.    Do not bring valuables to the hospital. [place].   Contacts, dentures or bridgework may not be worn into surgery.   Bring small overnight bag day of surgery.   DO NOT [place]. PHARMACY WILL DISPENSE MEDICATIONS LISTED ON YOUR MEDICATION LIST TO YOU DURING YOUR ADMISSION [place]!     Special Instructions: Bring a copy of your healthcare power of attorney and living will documents the day of surgery if you haven't scanned them before.              Please read over the following fact sheets you were given: IF [facility]  If you received a COVID test during your pre-op visit  it is requested that you wear a mask when out in public, stay away from anyone that may not be feeling well and notify your surgeon if you develop symptoms. If you test positive for Covid or have been in contact with anyone that has tested positive in the last 10 days please notify you surgeon.  [place] - Preparing for Surgery Before surgery, you can play an important role.  Because skin is not sterile, your skin needs to be as free of germs as possible.  You can reduce the number of germs on your skin by washing with CHG (chlorahexidine gluconate) soap before surgery.  CHG is an antiseptic cleaner which kills germs and bonds with the skin to continue killing germs even after washing. Please DO NOT use if you have an allergy to CHG or antibacterial soaps.  If your skin becomes reddened/irritated stop using the CHG and inform your nurse when you arrive at Short Stay. Do not shave (including legs and underarms) for at least 48 hours prior to the first CHG shower.  You may  shave your face/neck.  Please follow these instructions carefully:  1.  Shower with CHG Soap the night before surgery and the  morning of surgery.  2.  If you choose to wash your hair, wash your hair first as usual with your normal  shampoo.  3.  After you shampoo, rinse your hair and body thoroughly to remove the shampoo.                             4.  Use CHG as you would any other liquid soap.  You can apply chg directly to the skin and wash.  Gently with a scrungie or clean washcloth.  5.  Apply the CHG Soap to your body ONLY FROM THE NECK DOWN.   Do   not use on face/ open  Wound or open sores. Avoid contact with eyes, ears mouth and   genitals (private parts).                       Wash face,  Genitals (private parts) with your normal soap.             6.  Wash thoroughly, paying special attention to the area where your    surgery  will be performed.  7.  Thoroughly rinse your body with warm water from the neck down.  8.  DO NOT shower/wash with your normal soap after using and rinsing off the CHG Soap.                9.  Pat yourself dry with a clean towel.            10.  Wear clean pajamas.            11.  Place clean sheets on your bed the night of your first shower and do not  sleep with pets. Day of Surgery : Do not apply any lotions/deodorants the morning of surgery.  Please wear clean clothes to the hospital/surgery center.  FAILURE TO FOLLOW THESE INSTRUCTIONS MAY RESULT IN THE CANCELLATION OF YOUR SURGERY  PATIENT SIGNATURE_________________________________  NURSE SIGNATURE__________________________________  ________________________________________________________________________    Abigail Yoder  An incentive spirometer is a tool that can help keep your lungs clear and active. This tool measures how well you are filling your lungs with each breath. Taking long deep breaths may help reverse or decrease the chance of developing breathing  (pulmonary) problems (especially infection) following: A long period of time when you are unable to move or be active. BEFORE THE PROCEDURE  If the spirometer includes an indicator to show your best effort, your nurse or respiratory therapist will set it to a desired goal. If possible, sit up straight or lean slightly forward. Try not to slouch. Hold the incentive spirometer in an upright position. INSTRUCTIONS FOR USE  Sit on the edge of your bed if possible, or sit up as far as you can in bed or on a chair. Hold the incentive spirometer in an upright position. Breathe out normally. Place the mouthpiece in your mouth and seal your lips tightly around it. Breathe in slowly and as deeply as possible, raising the piston or the ball toward the top of the column. Hold your breath for 3-5 seconds or for as long as possible. Allow the piston or ball to fall to the bottom of the column. Remove the mouthpiece from your mouth and breathe out normally. Rest for a few seconds and repeat Steps 1 through 7 at least 10 times every 1-2 hours when you are awake. Take your time and take a few normal breaths between deep breaths. The spirometer may include an indicator to show your best effort. Use the indicator as a goal to work toward during each repetition. After each set of 10 deep breaths, practice coughing to be sure your lungs are clear. If you have an incision (the cut made at the time of surgery), support your incision when coughing by placing a pillow or rolled up towels firmly against it. Once you are able to get out of bed, walk around indoors and cough well. You may stop using the incentive spirometer when instructed by your caregiver.  RISKS AND COMPLICATIONS Take your time so you do not get dizzy or light-headed. If you are in pain, you  may need to take or ask for pain medication before doing incentive spirometry. It is harder to take a deep breath if you are having pain. AFTER USE Rest and  breathe slowly and easily. It can be helpful to keep track of a log of your progress. Your caregiver can provide you with a simple table to help with this. If you are using the spirometer at home, follow these instructions: [place] IF:  You are having difficultly using the spirometer. You have trouble using the spirometer as often as instructed. Your pain medication is not giving enough relief while using the spirometer. You develop fever of 100.5 F (38.1 C) or higher. SEEK IMMEDIATE MEDICAL CARE IF:  You cough up bloody sputum that had not been present before. You develop fever of 102 F (38.9 C) or greater. You develop worsening pain at or near the incision site. MAKE SURE YOU:  Understand these instructions. Will watch your condition. Will get help right away if you are not doing well or get worse. Document Released: 03/18/2007 Document Revised: 01/28/2012 Document Reviewed: 05/19/2007 [employer] Patient Information 2014 [employer], [place].   ________________________________________________________________________ WHAT IS A BLOOD TRANSFUSION? Blood Transfusion Information  A transfusion is the replacement of blood or some of its parts. Blood is made up of multiple cells which provide different functions. Red blood cells carry oxygen and are used for blood loss replacement. White blood cells fight against infection. Platelets control bleeding. Plasma helps clot blood. Other blood products are available for specialized needs, such as hemophilia or other clotting disorders. BEFORE THE TRANSFUSION  Who gives blood for transfusions?  Healthy volunteers who are fully evaluated to make sure their blood is safe. This is blood bank blood. Transfusion therapy is the safest it has ever been in the practice of medicine. Before blood is taken from a donor, a complete history is taken to make sure that person has no history of diseases nor engages in risky social behavior (examples are  intravenous drug use or sexual activity with multiple partners). The donor's travel history is screened to minimize risk of transmitting infections, such as malaria. The donated blood is tested for signs of infectious diseases, such as HIV and hepatitis. The blood is then tested to be sure it is compatible with you in order to minimize the chance of a transfusion reaction. If you or a relative donates blood, this is often done in anticipation of surgery and is not appropriate for emergency situations. It takes many days to process the donated blood. RISKS AND COMPLICATIONS Although transfusion therapy is very safe and saves many lives, the main dangers of transfusion include:  Getting an infectious disease. Developing a transfusion reaction. This is an allergic reaction to something in the blood you were given. Every precaution is taken to prevent this. The decision to have a blood transfusion has been considered carefully by your caregiver before blood is given. Blood is not given unless the benefits outweigh the risks. AFTER THE TRANSFUSION Right after receiving a blood transfusion, you will usually feel much better and more energetic. This is especially true if your red blood cells have gotten low (anemic). The transfusion raises the level of the red blood cells which carry oxygen, and this usually causes an energy increase. The nurse administering the transfusion will monitor you carefully for complications. HOME CARE INSTRUCTIONS  No special instructions are needed after a transfusion. You may find your energy is better. Speak with your caregiver about any limitations on activity  for underlying diseases you may have. SEEK MEDICAL CARE IF:  Your condition is not improving after your transfusion. You develop redness or irritation at the intravenous (IV) site. SEEK IMMEDIATE MEDICAL CARE IF:  Any of the following symptoms occur over the next 12 hours: Shaking chills. You have a temperature by  mouth above 102 F (38.9 C), not controlled by medicine. Chest, back, or muscle pain. People around you feel you are not acting correctly or are confused. Shortness of breath or difficulty breathing. Dizziness and fainting. You get a rash or develop hives. You have a decrease in urine output. Your urine turns a dark color or changes to pink, red, or brown. Any of the following symptoms occur over the next 10 days: You have a temperature by mouth above 102 F (38.9 C), not controlled by medicine. Shortness of breath. Weakness after normal activity. The white part of the eye turns yellow (jaundice). You have a decrease in the amount of urine or are urinating less often. Your urine turns a dark color or changes to pink, red, or brown. Document Released: 11/02/2000 Document Revised: 01/28/2012 Document Reviewed: 06/21/2008 [facility] Patient Information 2014 [place], [place].  _______________________________________________________________________

## 2022-12-11 NOTE — Progress Notes (Addendum)
COVID Vaccine Completed:  Yes  Date of COVID positive in last 90 days:  PCP - [name], [name] Cardiologist - no3  Chest x-ray - CT chest 10-10-22 Epic EKG - 04-19-22 Epic Stress Test -  ECHO - 03-06-17 Epic Cardiac Cath -  Pacemaker/ICD device last checked: Spinal Cord Stimulator:  Bowel Prep - clear liquids day of prep followed by Dulcolax, Miralax, Neomycin and Metronidazole  Sleep Study - Yes, +sleep apnea CPAP - no CPAP  Fasting Blood Sugar - 100-225 Checks Blood Sugar __3___ times a day  Last dose of GLP1 agonist-  N/A GLP1 instructions:  N/A    Last dose of SGLT-2 inhibitors-  N/A SGLT-2 instructions: N/A   Blood Thinner Instructions: Aspirin Instructions: Last Dose:  Activity level:  Can go up a flight of stairs and perform activities of daily living  without symptoms of chest pain or shortness of breath.    Anesthesia review: DM HTN, creatine 3.74, Hgb 9.2  Patient denies shortness of breath, fever, cough and chest pain at PAT appointment  Patient verbalized understanding of instructions that were given to them at the PAT appointment. Patient was also instructed that they will need to review over the PAT instructions again at home before surgery.

## 2022-12-14 ENCOUNTER — Other Ambulatory Visit: Payer: Self-pay

## 2022-12-14 ENCOUNTER — Encounter (HOSPITAL_COMMUNITY)
Admission: RE | Admit: 2022-12-14 | Discharge: 2022-12-14 | Disposition: A | Discharge status: Home / Self Care | Payer: 59 | Source: Ambulatory Visit | Attending: Surgery | Admitting: Surgery

## 2022-12-14 ENCOUNTER — Encounter (HOSPITAL_COMMUNITY): Payer: Self-pay

## 2022-12-14 DIAGNOSIS — I2510 Atherosclerotic heart disease of native coronary artery without angina pectoris: Secondary | ICD-10-CM

## 2022-12-14 DIAGNOSIS — Z794 Long term (current) use of insulin: Secondary | ICD-10-CM | POA: Insufficient documentation

## 2022-12-14 DIAGNOSIS — R739 Hyperglycemia, unspecified: Secondary | ICD-10-CM | POA: Insufficient documentation

## 2022-12-14 DIAGNOSIS — D649 Anemia, unspecified: Secondary | ICD-10-CM | POA: Diagnosis not present

## 2022-12-14 DIAGNOSIS — Z01812 Encounter for preprocedural laboratory examination: Secondary | ICD-10-CM | POA: Insufficient documentation

## 2022-12-14 DIAGNOSIS — E119 Type 2 diabetes mellitus without complications: Secondary | ICD-10-CM | POA: Insufficient documentation

## 2022-12-14 DIAGNOSIS — Z01818 Encounter for other preprocedural examination: Secondary | ICD-10-CM

## 2022-12-14 HISTORY — DX: Chronic kidney disease, unspecified: N18.9

## 2022-12-14 LAB — BASIC METABOLIC PANEL
Anion gap: 9 (ref 5–15)
BUN: 50 mg/dL — ABNORMAL HIGH (ref 6–20)
CO2: 23 mmol/L (ref 22–32)
Calcium: 8.6 mg/dL — ABNORMAL LOW (ref 8.9–10.3)
Chloride: 106 mmol/L (ref 98–111)
Creatinine, Ser: 3.74 mg/dL — ABNORMAL HIGH (ref 0.44–1.00)
GFR, Estimated: 14 mL/min — ABNORMAL LOW (ref >60)
Glucose, Bld: 250 mg/dL — ABNORMAL HIGH (ref 70–99)
Potassium: 4.8 mmol/L (ref 3.5–5.1)
Sodium: 138 mmol/L (ref 135–145)

## 2022-12-14 LAB — CBC
HCT: 29.2 % — ABNORMAL LOW (ref 36.0–46.0)
Hemoglobin: 9.2 g/dL — ABNORMAL LOW (ref 12.0–15.0)
MCH: 29.7 pg (ref 26.0–34.0)
MCHC: 31.5 g/dL (ref 30.0–36.0)
MCV: 94.2 fL (ref 80.0–100.0)
Platelets: 160 10*3/uL (ref 150–400)
RBC: 3.1 MIL/uL — ABNORMAL LOW (ref 3.87–5.11)
RDW: 15.9 % — ABNORMAL HIGH (ref 11.5–15.5)
WBC: 6.1 10*3/uL (ref 4.0–10.5)
nRBC: 0 % (ref 0.0–0.2)

## 2022-12-14 LAB — HEMOGLOBIN A1C
Hgb A1c MFr Bld: 8.8 % — ABNORMAL HIGH (ref 4.8–5.6)
Mean Plasma Glucose: 205.86 mg/dL

## 2022-12-14 LAB — GLUCOSE, CAPILLARY: Glucose-Capillary: 245 mg/dL — ABNORMAL HIGH (ref 70–99)

## 2022-12-14 NOTE — Progress Notes (Signed)
No changes in pt. Meds since last reviewed by pharmacy

## 2022-12-17 NOTE — Progress Notes (Signed)
Hgba1c-8.8 routed to Dr gross on 12/17/22.

## 2022-12-18 ENCOUNTER — Encounter (HOSPITAL_COMMUNITY): Payer: Self-pay | Admitting: Surgery

## 2022-12-19 ENCOUNTER — Encounter (HOSPITAL_COMMUNITY)
Admission: RE | Disposition: A | Discharge status: Home / Self Care | Payer: Self-pay | Source: Home / Self Care | Attending: Surgery

## 2022-12-19 ENCOUNTER — Inpatient Hospital Stay (HOSPITAL_BASED_OUTPATIENT_CLINIC_OR_DEPARTMENT_OTHER): Payer: No Typology Code available for payment source | Admitting: Anesthesiology

## 2022-12-19 ENCOUNTER — Other Ambulatory Visit: Payer: Self-pay | Admitting: Surgery

## 2022-12-19 ENCOUNTER — Other Ambulatory Visit: Payer: Self-pay

## 2022-12-19 ENCOUNTER — Encounter (HOSPITAL_COMMUNITY): Payer: Self-pay | Admitting: Surgery

## 2022-12-19 ENCOUNTER — Inpatient Hospital Stay (HOSPITAL_COMMUNITY): Payer: No Typology Code available for payment source | Admitting: Physician Assistant

## 2022-12-19 ENCOUNTER — Inpatient Hospital Stay (HOSPITAL_COMMUNITY)
Admission: RE | Admit: 2022-12-19 | Discharge: 2022-12-23 | DRG: 330 | Disposition: A | Discharge status: Home / Self Care | Payer: No Typology Code available for payment source | Attending: Surgery | Admitting: Surgery

## 2022-12-19 DIAGNOSIS — I10 Essential (primary) hypertension: Secondary | ICD-10-CM | POA: Diagnosis present

## 2022-12-19 DIAGNOSIS — R1310 Dysphagia, unspecified: Secondary | ICD-10-CM | POA: Diagnosis present

## 2022-12-19 DIAGNOSIS — I2510 Atherosclerotic heart disease of native coronary artery without angina pectoris: Secondary | ICD-10-CM

## 2022-12-19 DIAGNOSIS — E1165 Type 2 diabetes mellitus with hyperglycemia: Secondary | ICD-10-CM | POA: Diagnosis present

## 2022-12-19 DIAGNOSIS — E119 Type 2 diabetes mellitus without complications: Principal | ICD-10-CM

## 2022-12-19 DIAGNOSIS — C184 Malignant neoplasm of transverse colon: Secondary | ICD-10-CM | POA: Diagnosis present

## 2022-12-19 DIAGNOSIS — I129 Hypertensive chronic kidney disease with stage 1 through stage 4 chronic kidney disease, or unspecified chronic kidney disease: Secondary | ICD-10-CM | POA: Diagnosis present

## 2022-12-19 DIAGNOSIS — G4730 Sleep apnea, unspecified: Secondary | ICD-10-CM | POA: Diagnosis present

## 2022-12-19 DIAGNOSIS — D509 Iron deficiency anemia, unspecified: Secondary | ICD-10-CM | POA: Diagnosis present

## 2022-12-19 DIAGNOSIS — Z803 Family history of malignant neoplasm of breast: Secondary | ICD-10-CM

## 2022-12-19 DIAGNOSIS — Z823 Family history of stroke: Secondary | ICD-10-CM

## 2022-12-19 DIAGNOSIS — Z635 Disruption of family by separation and divorce: Secondary | ICD-10-CM

## 2022-12-19 DIAGNOSIS — Z794 Long term (current) use of insulin: Secondary | ICD-10-CM | POA: Diagnosis not present

## 2022-12-19 DIAGNOSIS — C183 Malignant neoplasm of hepatic flexure: Principal | ICD-10-CM

## 2022-12-19 DIAGNOSIS — Z7984 Long term (current) use of oral hypoglycemic drugs: Secondary | ICD-10-CM | POA: Diagnosis not present

## 2022-12-19 DIAGNOSIS — Z853 Personal history of malignant neoplasm of breast: Secondary | ICD-10-CM | POA: Diagnosis not present

## 2022-12-19 DIAGNOSIS — Z87891 Personal history of nicotine dependence: Secondary | ICD-10-CM

## 2022-12-19 DIAGNOSIS — Z01818 Encounter for other preprocedural examination: Secondary | ICD-10-CM

## 2022-12-19 DIAGNOSIS — Z79810 Long term (current) use of selective estrogen receptor modulators (SERMs): Secondary | ICD-10-CM | POA: Diagnosis not present

## 2022-12-19 DIAGNOSIS — C189 Malignant neoplasm of colon, unspecified: Secondary | ICD-10-CM | POA: Diagnosis not present

## 2022-12-19 DIAGNOSIS — Z79899 Other long term (current) drug therapy: Secondary | ICD-10-CM

## 2022-12-19 DIAGNOSIS — E1122 Type 2 diabetes mellitus with diabetic chronic kidney disease: Secondary | ICD-10-CM | POA: Diagnosis present

## 2022-12-19 DIAGNOSIS — Z8249 Family history of ischemic heart disease and other diseases of the circulatory system: Secondary | ICD-10-CM | POA: Diagnosis not present

## 2022-12-19 DIAGNOSIS — Z8261 Family history of arthritis: Secondary | ICD-10-CM

## 2022-12-19 DIAGNOSIS — N184 Chronic kidney disease, stage 4 (severe): Secondary | ICD-10-CM | POA: Diagnosis present

## 2022-12-19 DIAGNOSIS — Z833 Family history of diabetes mellitus: Secondary | ICD-10-CM | POA: Diagnosis not present

## 2022-12-19 DIAGNOSIS — Z6841 Body Mass Index (BMI) 40.0 and over, adult: Secondary | ICD-10-CM | POA: Diagnosis not present

## 2022-12-19 DIAGNOSIS — D62 Acute posthemorrhagic anemia: Secondary | ICD-10-CM | POA: Diagnosis not present

## 2022-12-19 DIAGNOSIS — Z923 Personal history of irradiation: Secondary | ICD-10-CM

## 2022-12-19 DIAGNOSIS — D649 Anemia, unspecified: Secondary | ICD-10-CM

## 2022-12-19 DIAGNOSIS — Z83438 Family history of other disorder of lipoprotein metabolism and other lipidemia: Secondary | ICD-10-CM

## 2022-12-19 HISTORY — DX: Malignant neoplasm of hepatic flexure: C18.3

## 2022-12-19 LAB — GLUCOSE, CAPILLARY
Glucose-Capillary: 127 mg/dL — ABNORMAL HIGH (ref 70–99)
Glucose-Capillary: 235 mg/dL — ABNORMAL HIGH (ref 70–99)
Glucose-Capillary: 212 mg/dL — ABNORMAL HIGH (ref 70–99)
Glucose-Capillary: 195 mg/dL — ABNORMAL HIGH (ref 70–99)
Glucose-Capillary: 330 mg/dL — ABNORMAL HIGH (ref 70–99)

## 2022-12-19 SURGERY — COLECTOMY, PARTIAL, ROBOT-ASSISTED, LAPAROSCOPIC
Anesthesia: General | Site: Abdomen

## 2022-12-19 MED ORDER — SODIUM CHLORIDE 0.9% FLUSH: 3.0000 mL | INTRAVENOUS | Status: DC | PRN

## 2022-12-19 MED ORDER — KETAMINE HCL 10 MG/ML IJ SOLN
INTRAMUSCULAR | Status: DC | PRN
  Administered 2022-12-19 (×2): 10 mg via INTRAVENOUS

## 2022-12-19 MED ORDER — FENTANYL CITRATE (PF) 100 MCG/2ML IJ SOLN
INTRAMUSCULAR | Status: AC
  Filled 2022-12-19: qty 2

## 2022-12-19 MED ORDER — SODIUM CHLORIDE 0.9 % IV SOLN
2.0000 g | Freq: Two times a day (BID) | INTRAVENOUS | Status: AC
  Administered 2022-12-19: 2 g via INTRAVENOUS
  Filled 2022-12-19: qty 2

## 2022-12-19 MED ORDER — PHENYLEPHRINE HCL-NACL 20-0.9 MG/250ML-% IV SOLN
INTRAVENOUS | Status: DC | PRN
Start: 2022-12-19 — End: 2022-12-19
  Administered 2022-12-19: 40 ug/min via INTRAVENOUS

## 2022-12-19 MED ORDER — PROPOFOL 10 MG/ML IV BOLUS
INTRAVENOUS | Status: DC | PRN
  Administered 2022-12-19: 170 mg via INTRAVENOUS

## 2022-12-19 MED ORDER — SODIUM CHLORIDE 0.9 % IV SOLN
2.0000 g | INTRAVENOUS | Status: AC
  Administered 2022-12-19: 2 g via INTRAVENOUS
  Filled 2022-12-19: qty 2

## 2022-12-19 MED ORDER — SUGAMMADEX SODIUM 200 MG/2ML IV SOLN
INTRAVENOUS | Status: DC | PRN
  Administered 2022-12-19: 350 mg via INTRAVENOUS

## 2022-12-19 MED ORDER — PHENOL 1.4 % MT LIQD: 2.0000 | OROMUCOSAL | Status: DC | PRN

## 2022-12-19 MED ORDER — ALVIMOPAN 12 MG PO CAPS
12.0000 mg | ORAL_CAPSULE | Freq: Two times a day (BID) | ORAL | Status: DC
  Administered 2022-12-20: 12 mg via ORAL
  Filled 2022-12-19 (×2): qty 1

## 2022-12-19 MED ORDER — FENTANYL CITRATE (PF) 100 MCG/2ML IJ SOLN
INTRAMUSCULAR | Status: DC | PRN
  Administered 2022-12-19: 100 ug via INTRAVENOUS

## 2022-12-19 MED ORDER — ACETAMINOPHEN 325 MG PO TABS: 325.0000 mg | ORAL_TABLET | ORAL | Status: DC | PRN

## 2022-12-19 MED ORDER — POLYETHYLENE GLYCOL 3350 17 GM/SCOOP PO POWD: 1.0000 | Freq: Once | ORAL | Status: DC

## 2022-12-19 MED ORDER — INSULIN ASPART 100 UNIT/ML IJ SOLN
0.0000 [IU] | Freq: Three times a day (TID) | INTRAMUSCULAR | Status: DC
  Administered 2022-12-19: 3 [IU] via SUBCUTANEOUS
  Administered 2022-12-20: 8 [IU] via SUBCUTANEOUS
  Administered 2022-12-20: 3 [IU] via SUBCUTANEOUS
  Administered 2022-12-20 – 2022-12-21 (×2): 5 [IU] via SUBCUTANEOUS
  Administered 2022-12-21: 2 [IU] via SUBCUTANEOUS
  Administered 2022-12-21: 5 [IU] via SUBCUTANEOUS
  Administered 2022-12-22: 2 [IU] via SUBCUTANEOUS
  Administered 2022-12-23 (×2): 3 [IU] via SUBCUTANEOUS

## 2022-12-19 MED ORDER — HYDRALAZINE HCL 50 MG PO TABS
50.0000 mg | ORAL_TABLET | Freq: Three times a day (TID) | ORAL | Status: DC
  Administered 2022-12-19 – 2022-12-23 (×12): 50 mg via ORAL
  Filled 2022-12-19 (×12): qty 1

## 2022-12-19 MED ORDER — ENSURE SURGERY PO LIQD
237.0000 mL | Freq: Two times a day (BID) | ORAL | Status: DC
  Administered 2022-12-19 – 2022-12-23 (×3): 237 mL via ORAL

## 2022-12-19 MED ORDER — LACTATED RINGERS IV SOLN: INTRAVENOUS | Status: DC

## 2022-12-19 MED ORDER — PHENYLEPHRINE HCL (PRESSORS) 10 MG/ML IV SOLN
INTRAVENOUS | Status: AC
  Filled 2022-12-19: qty 1

## 2022-12-19 MED ORDER — NEOMYCIN SULFATE 500 MG PO TABS: 1000.0000 mg | ORAL_TABLET | ORAL | Status: DC

## 2022-12-19 MED ORDER — EPHEDRINE SULFATE-NACL 50-0.9 MG/10ML-% IV SOSY
PREFILLED_SYRINGE | INTRAVENOUS | Status: DC | PRN
  Administered 2022-12-19 (×2): 10 mg via INTRAVENOUS
  Administered 2022-12-19: 5 mg via INTRAVENOUS

## 2022-12-19 MED ORDER — PROCHLORPERAZINE MALEATE 10 MG PO TABS: 10.0000 mg | ORAL_TABLET | Freq: Four times a day (QID) | ORAL | Status: DC | PRN

## 2022-12-19 MED ORDER — METOPROLOL TARTRATE 5 MG/5ML IV SOLN: 5.0000 mg | Freq: Four times a day (QID) | INTRAVENOUS | Status: DC | PRN

## 2022-12-19 MED ORDER — ONDANSETRON HCL 4 MG PO TABS: 4.0000 mg | ORAL_TABLET | Freq: Four times a day (QID) | ORAL | Status: DC | PRN

## 2022-12-19 MED ORDER — METRONIDAZOLE 500 MG PO TABS: 1000.0000 mg | ORAL_TABLET | ORAL | Status: DC

## 2022-12-19 MED ORDER — GABAPENTIN 300 MG PO CAPS
300.0000 mg | ORAL_CAPSULE | ORAL | Status: AC
  Administered 2022-12-19: 300 mg via ORAL
  Filled 2022-12-19: qty 1

## 2022-12-19 MED ORDER — ENSURE PRE-SURGERY PO LIQD
296.0000 mL | Freq: Once | ORAL | Status: DC
  Filled 2022-12-19: qty 296

## 2022-12-19 MED ORDER — CALCIUM POLYCARBOPHIL 625 MG PO TABS
625.0000 mg | ORAL_TABLET | Freq: Two times a day (BID) | ORAL | Status: DC
  Administered 2022-12-19 – 2022-12-23 (×8): 625 mg via ORAL
  Filled 2022-12-19 (×8): qty 1

## 2022-12-19 MED ORDER — DIPHENHYDRAMINE HCL 50 MG/ML IJ SOLN: 12.5000 mg | Freq: Four times a day (QID) | INTRAMUSCULAR | Status: DC | PRN

## 2022-12-19 MED ORDER — DIPHENHYDRAMINE HCL 12.5 MG/5ML PO ELIX: 12.5000 mg | ORAL_SOLUTION | Freq: Four times a day (QID) | ORAL | Status: DC | PRN

## 2022-12-19 MED ORDER — ONDANSETRON HCL 4 MG/2ML IJ SOLN
INTRAMUSCULAR | Status: AC
  Filled 2022-12-19: qty 4

## 2022-12-19 MED ORDER — PROPOFOL 10 MG/ML IV BOLUS
INTRAVENOUS | Status: AC
  Filled 2022-12-19: qty 20

## 2022-12-19 MED ORDER — ALVIMOPAN 12 MG PO CAPS
12.0000 mg | ORAL_CAPSULE | ORAL | Status: AC
  Administered 2022-12-19: 12 mg via ORAL
  Filled 2022-12-19: qty 1

## 2022-12-19 MED ORDER — CARVEDILOL 25 MG PO TABS
25.0000 mg | ORAL_TABLET | Freq: Two times a day (BID) | ORAL | Status: DC
  Administered 2022-12-19 – 2022-12-23 (×8): 25 mg via ORAL
  Filled 2022-12-19 (×8): qty 1

## 2022-12-19 MED ORDER — 0.9 % SODIUM CHLORIDE (POUR BTL) OPTIME
TOPICAL | Status: DC | PRN
  Administered 2022-12-19: 2000 mL

## 2022-12-19 MED ORDER — BUPIVACAINE LIPOSOME 1.3 % IJ SUSP
INTRAMUSCULAR | Status: AC
  Filled 2022-12-19: qty 20

## 2022-12-19 MED ORDER — ONDANSETRON HCL 4 MG/2ML IJ SOLN
4.0000 mg | Freq: Four times a day (QID) | INTRAMUSCULAR | Status: DC | PRN
  Filled 2022-12-19: qty 2

## 2022-12-19 MED ORDER — DEXAMETHASONE SODIUM PHOSPHATE 10 MG/ML IJ SOLN
INTRAMUSCULAR | Status: DC | PRN
  Administered 2022-12-19: 5 mg via INTRAVENOUS

## 2022-12-19 MED ORDER — ALUM & MAG HYDROXIDE-SIMETH 200-200-20 MG/5ML PO SUSP: 30.0000 mL | Freq: Four times a day (QID) | ORAL | Status: DC | PRN

## 2022-12-19 MED ORDER — MAGIC MOUTHWASH: 15.0000 mL | Freq: Four times a day (QID) | ORAL | Status: DC | PRN

## 2022-12-19 MED ORDER — ACETAMINOPHEN 10 MG/ML IV SOLN: 1000.0000 mg | Freq: Once | INTRAVENOUS | Status: DC | PRN

## 2022-12-19 MED ORDER — CHLORHEXIDINE GLUCONATE 0.12 % MT SOLN
15.0000 mL | Freq: Once | OROMUCOSAL | Status: AC
  Administered 2022-12-19: 15 mL via OROMUCOSAL

## 2022-12-19 MED ORDER — MELATONIN 3 MG PO TABS: 3.0000 mg | ORAL_TABLET | Freq: Every evening | ORAL | Status: DC | PRN

## 2022-12-19 MED ORDER — TRAMADOL HCL 50 MG PO TABS: 50.0000 mg | ORAL_TABLET | Freq: Four times a day (QID) | ORAL | Status: DC | PRN

## 2022-12-19 MED ORDER — ORAL CARE MOUTH RINSE: 15.0000 mL | Freq: Once | OROMUCOSAL | Status: AC

## 2022-12-19 MED ORDER — METHOCARBAMOL 500 MG PO TABS: 1000.0000 mg | ORAL_TABLET | Freq: Four times a day (QID) | ORAL | Status: DC | PRN

## 2022-12-19 MED ORDER — ROCURONIUM BROMIDE 10 MG/ML (PF) SYRINGE
PREFILLED_SYRINGE | INTRAVENOUS | Status: AC
  Filled 2022-12-19: qty 20

## 2022-12-19 MED ORDER — EPHEDRINE 5 MG/ML INJ
INTRAVENOUS | Status: AC
  Filled 2022-12-19: qty 5

## 2022-12-19 MED ORDER — PROCHLORPERAZINE EDISYLATE 10 MG/2ML IJ SOLN: 5.0000 mg | Freq: Four times a day (QID) | INTRAMUSCULAR | Status: DC | PRN

## 2022-12-19 MED ORDER — INSULIN ASPART 100 UNIT/ML IJ SOLN
INTRAMUSCULAR | Status: AC
  Filled 2022-12-19: qty 1

## 2022-12-19 MED ORDER — INSULIN ASPART 100 UNIT/ML IJ SOLN
0.0000 [IU] | Freq: Every day | INTRAMUSCULAR | Status: DC
  Administered 2022-12-19: 3 [IU] via SUBCUTANEOUS
  Administered 2022-12-20: 2 [IU] via SUBCUTANEOUS

## 2022-12-19 MED ORDER — BISACODYL 5 MG PO TBEC: 20.0000 mg | DELAYED_RELEASE_TABLET | Freq: Once | ORAL | Status: DC

## 2022-12-19 MED ORDER — SODIUM BICARBONATE 650 MG PO TABS
650.0000 mg | ORAL_TABLET | Freq: Two times a day (BID) | ORAL | Status: DC
  Administered 2022-12-19 – 2022-12-23 (×8): 650 mg via ORAL
  Filled 2022-12-19 (×8): qty 1

## 2022-12-19 MED ORDER — FENTANYL CITRATE PF 50 MCG/ML IJ SOSY: 25.0000 ug | PREFILLED_SYRINGE | INTRAMUSCULAR | Status: DC | PRN

## 2022-12-19 MED ORDER — OXYCODONE HCL 5 MG/5ML PO SOLN: 5.0000 mg | Freq: Once | ORAL | Status: DC | PRN

## 2022-12-19 MED ORDER — AMLODIPINE BESYLATE 10 MG PO TABS
10.0000 mg | ORAL_TABLET | Freq: Every day | ORAL | Status: DC
  Administered 2022-12-20 – 2022-12-23 (×4): 10 mg via ORAL
  Filled 2022-12-19 (×4): qty 1

## 2022-12-19 MED ORDER — LACTATED RINGERS IV SOLN
INTRAVENOUS | Status: DC
  Administered 2022-12-19: 10 mL via INTRAVENOUS

## 2022-12-19 MED ORDER — ENOXAPARIN SODIUM 40 MG/0.4ML IJ SOSY
40.0000 mg | PREFILLED_SYRINGE | Freq: Once | INTRAMUSCULAR | Status: AC
  Administered 2022-12-19: 40 mg via SUBCUTANEOUS
  Filled 2022-12-19: qty 0.4

## 2022-12-19 MED ORDER — LACTATED RINGERS IV BOLUS: 1000.0000 mL | Freq: Three times a day (TID) | INTRAVENOUS | Status: AC | PRN

## 2022-12-19 MED ORDER — MIDAZOLAM HCL 5 MG/5ML IJ SOLN
INTRAMUSCULAR | Status: DC | PRN
  Administered 2022-12-19: 2 mg via INTRAVENOUS

## 2022-12-19 MED ORDER — LIDOCAINE 2% (20 MG/ML) 5 ML SYRINGE
INTRAMUSCULAR | Status: DC | PRN
  Administered 2022-12-19: 100 mg via INTRAVENOUS

## 2022-12-19 MED ORDER — TAMOXIFEN CITRATE 10 MG PO TABS
20.0000 mg | ORAL_TABLET | Freq: Every day | ORAL | Status: DC
  Administered 2022-12-20 – 2022-12-23 (×4): 20 mg via ORAL
  Filled 2022-12-19 (×4): qty 2

## 2022-12-19 MED ORDER — AMISULPRIDE (ANTIEMETIC) 5 MG/2ML IV SOLN: 10.0000 mg | Freq: Once | INTRAVENOUS | Status: DC | PRN

## 2022-12-19 MED ORDER — ONDANSETRON HCL 4 MG/2ML IJ SOLN
INTRAMUSCULAR | Status: DC | PRN
  Administered 2022-12-19: 4 mg via INTRAVENOUS

## 2022-12-19 MED ORDER — SIMETHICONE 80 MG PO CHEW: 40.0000 mg | CHEWABLE_TABLET | Freq: Four times a day (QID) | ORAL | Status: DC | PRN

## 2022-12-19 MED ORDER — LIP MEDEX EX OINT
TOPICAL_OINTMENT | Freq: Two times a day (BID) | CUTANEOUS | Status: DC
  Administered 2022-12-22: 1 via TOPICAL
  Filled 2022-12-19: qty 7

## 2022-12-19 MED ORDER — SODIUM CHLORIDE 0.9% FLUSH
3.0000 mL | Freq: Two times a day (BID) | INTRAVENOUS | Status: DC
  Administered 2022-12-19 – 2022-12-22 (×6): 3 mL via INTRAVENOUS

## 2022-12-19 MED ORDER — OXYCODONE HCL 5 MG PO TABS: 5.0000 mg | ORAL_TABLET | Freq: Once | ORAL | Status: DC | PRN

## 2022-12-19 MED ORDER — ENSURE PRE-SURGERY PO LIQD
592.0000 mL | Freq: Once | ORAL | Status: DC
  Filled 2022-12-19: qty 592

## 2022-12-19 MED ORDER — LACTATED RINGERS IR SOLN
Status: DC | PRN
  Administered 2022-12-19: 1000 mL

## 2022-12-19 MED ORDER — PROMETHAZINE HCL 25 MG/ML IJ SOLN: 6.2500 mg | INTRAMUSCULAR | Status: DC | PRN

## 2022-12-19 MED ORDER — DEXAMETHASONE SODIUM PHOSPHATE 10 MG/ML IJ SOLN
INTRAMUSCULAR | Status: AC
  Filled 2022-12-19: qty 2

## 2022-12-19 MED ORDER — HEPARIN SODIUM (PORCINE) 5000 UNIT/ML IJ SOLN
5000.0000 [IU] | Freq: Three times a day (TID) | INTRAMUSCULAR | Status: DC
  Administered 2022-12-19 – 2022-12-20 (×3): 5000 [IU] via SUBCUTANEOUS
  Filled 2022-12-19 (×3): qty 1

## 2022-12-19 MED ORDER — INSULIN ASPART 100 UNIT/ML IJ SOLN
5.0000 [IU] | Freq: Once | INTRAMUSCULAR | Status: AC
  Administered 2022-12-19: 5 [IU] via SUBCUTANEOUS

## 2022-12-19 MED ORDER — ACETAMINOPHEN 500 MG PO TABS
1000.0000 mg | ORAL_TABLET | Freq: Four times a day (QID) | ORAL | Status: DC
  Administered 2022-12-19 – 2022-12-23 (×12): 1000 mg via ORAL
  Filled 2022-12-19 (×15): qty 2

## 2022-12-19 MED ORDER — BUPIVACAINE LIPOSOME 1.3 % IJ SUSP: 20.0000 mL | Freq: Once | INTRAMUSCULAR | Status: DC

## 2022-12-19 MED ORDER — ACETAMINOPHEN 160 MG/5ML PO SOLN: 325.0000 mg | ORAL | Status: DC | PRN

## 2022-12-19 MED ORDER — BUPIVACAINE HCL 0.25 % IJ SOLN
INTRAMUSCULAR | Status: AC
  Filled 2022-12-19: qty 1

## 2022-12-19 MED ORDER — HYDROMORPHONE HCL 1 MG/ML IJ SOLN: 0.5000 mg | INTRAMUSCULAR | Status: DC | PRN

## 2022-12-19 MED ORDER — ROCURONIUM BROMIDE 10 MG/ML (PF) SYRINGE
PREFILLED_SYRINGE | INTRAVENOUS | Status: DC | PRN
  Administered 2022-12-19: 10 mg via INTRAVENOUS
  Administered 2022-12-19: 20 mg via INTRAVENOUS
  Administered 2022-12-19: 70 mg via INTRAVENOUS

## 2022-12-19 MED ORDER — KETAMINE HCL 10 MG/ML IJ SOLN
INTRAMUSCULAR | Status: AC
  Filled 2022-12-19: qty 1

## 2022-12-19 MED ORDER — ACETAMINOPHEN 500 MG PO TABS
1000.0000 mg | ORAL_TABLET | ORAL | Status: AC
  Administered 2022-12-19: 1000 mg via ORAL
  Filled 2022-12-19: qty 2

## 2022-12-19 MED ORDER — SODIUM CHLORIDE 0.9 % IV SOLN: 250.0000 mL | INTRAVENOUS | Status: DC | PRN

## 2022-12-19 MED ORDER — MIDAZOLAM HCL 2 MG/2ML IJ SOLN
INTRAMUSCULAR | Status: AC
  Filled 2022-12-19: qty 2

## 2022-12-19 MED ORDER — MENTHOL 3 MG MT LOZG: 1.0000 | LOZENGE | OROMUCOSAL | Status: DC | PRN

## 2022-12-19 MED ORDER — METHOCARBAMOL 1000 MG/10ML IJ SOLN: 1000.0000 mg | Freq: Four times a day (QID) | INTRAVENOUS | Status: DC | PRN

## 2022-12-19 MED ORDER — HYDRALAZINE HCL 20 MG/ML IJ SOLN: 10.0000 mg | INTRAMUSCULAR | Status: DC | PRN

## 2022-12-19 MED ORDER — LIDOCAINE HCL (PF) 2 % IJ SOLN
INTRAMUSCULAR | Status: AC
  Filled 2022-12-19: qty 10

## 2022-12-19 MED ORDER — SCOPOLAMINE 1 MG/3DAYS TD PT72: 1.0000 | MEDICATED_PATCH | TRANSDERMAL | Status: DC

## 2022-12-19 MED ORDER — SPY AGENT GREEN - (INDOCYANINE FOR INJECTION)
INTRAMUSCULAR | Status: DC | PRN
  Administered 2022-12-19: 1 mL via INTRAVENOUS

## 2022-12-19 SURGICAL SUPPLY — 118 items
APPLIER CLIP 5 13 M/L LIGAMAX5 (MISCELLANEOUS)
APPLIER CLIP ROT 10 11.4 M/L (STAPLE)
BAG COUNTER SPONGE SURGICOUNT (BAG) ×1 IMPLANT
BLADE EXTENDED COATED 6.5IN (ELECTRODE) IMPLANT
CANNULA REDUC XI 12-8 STAPL (CANNULA) ×1
CANNULA REDUCER 12-8 DVNC XI (CANNULA) IMPLANT
CELLS DAT CNTRL 66122 CELL SVR (MISCELLANEOUS) ×1 IMPLANT
CHLORAPREP W/TINT 26 (MISCELLANEOUS) IMPLANT
CLIP APPLIE 5 13 M/L LIGAMAX5 (MISCELLANEOUS) IMPLANT
CLIP APPLIE ROT 10 11.4 M/L (STAPLE) IMPLANT
COVER SURGICAL LIGHT HANDLE (MISCELLANEOUS) ×2 IMPLANT
COVER TIP SHEARS 8 DVNC (MISCELLANEOUS) ×1 IMPLANT
COVER TIP SHEARS 8MM DA VINCI (MISCELLANEOUS) ×1
DEVICE TROCAR PUNCTURE CLOSURE (ENDOMECHANICALS) IMPLANT
DRAIN CHANNEL 19F RND (DRAIN) IMPLANT
DRAPE ARM DVNC X/XI (DISPOSABLE) ×4 IMPLANT
DRAPE COLUMN DVNC XI (DISPOSABLE) ×1 IMPLANT
DRAPE DA VINCI XI ARM (DISPOSABLE) ×4
DRAPE DA VINCI XI COLUMN (DISPOSABLE) ×1
DRAPE SURG IRRIG POUCH 19X23 (DRAPES) ×1 IMPLANT
DRSG OPSITE POSTOP 4X10 (GAUZE/BANDAGES/DRESSINGS) IMPLANT
DRSG OPSITE POSTOP 4X6 (GAUZE/BANDAGES/DRESSINGS) IMPLANT
DRSG OPSITE POSTOP 4X8 (GAUZE/BANDAGES/DRESSINGS) IMPLANT
DRSG TEGADERM 2-3/8X2-3/4 SM (GAUZE/BANDAGES/DRESSINGS) ×5 IMPLANT
DRSG TEGADERM 4X4.75 (GAUZE/BANDAGES/DRESSINGS) IMPLANT
ELECT PENCIL ROCKER SW 15FT (MISCELLANEOUS) ×1 IMPLANT
ELECT REM PT RETURN 15FT ADLT (MISCELLANEOUS) ×1 IMPLANT
ENDOLOOP SUT PDS II  0 18 (SUTURE)
ENDOLOOP SUT PDS II 0 18 (SUTURE) IMPLANT
EVACUATOR SILICONE 100CC (DRAIN) IMPLANT
GAUZE SPONGE 2X2 STRL 8-PLY (GAUZE/BANDAGES/DRESSINGS) ×1 IMPLANT
GLOVE ECLIPSE 8.0 STRL XLNG CF (GLOVE) ×3 IMPLANT
GLOVE INDICATOR 8.0 STRL GRN (GLOVE) ×3 IMPLANT
GOWN SRG XL LVL 4 BRTHBL STRL (GOWNS) ×1 IMPLANT
GOWN STRL NON-REIN XL LVL4 (GOWNS) ×1
GOWN STRL REUS W/ TWL XL LVL3 (GOWN DISPOSABLE) ×4 IMPLANT
GOWN STRL REUS W/TWL XL LVL3 (GOWN DISPOSABLE) ×4
GRASPER SUT TROCAR 14GX15 (MISCELLANEOUS) IMPLANT
HOLDER FOLEY CATH W/STRAP (MISCELLANEOUS) ×1 IMPLANT
IRRIG SUCT STRYKERFLOW 2 WTIP (MISCELLANEOUS) ×1
IRRIGATION SUCT STRKRFLW 2 WTP (MISCELLANEOUS) ×1 IMPLANT
KIT PROCEDURE DA VINCI SI (MISCELLANEOUS) ×1
KIT PROCEDURE DVNC SI (MISCELLANEOUS) IMPLANT
KIT SIGMOIDOSCOPE (SET/KITS/TRAYS/PACK) IMPLANT
KIT TURNOVER KIT A (KITS) IMPLANT
NDL INSUFFLATION 14GA 120MM (NEEDLE) ×1 IMPLANT
NEEDLE INSUFFLATION 14GA 120MM (NEEDLE) ×1 IMPLANT
PACK CARDIOVASCULAR III (CUSTOM PROCEDURE TRAY) ×1 IMPLANT
PACK COLON (CUSTOM PROCEDURE TRAY) ×1 IMPLANT
PAD POSITIONING PINK XL (MISCELLANEOUS) ×1 IMPLANT
PROTECTOR NERVE ULNAR (MISCELLANEOUS) ×2 IMPLANT
RELOAD STAPLE 45 3.5 BLU DVNC (STAPLE) IMPLANT
RELOAD STAPLE 45 4.3 GRN DVNC (STAPLE) IMPLANT
RELOAD STAPLE 60 2.5 WHT DVNC (STAPLE) IMPLANT
RELOAD STAPLE 60 3.5 BLU DVNC (STAPLE) IMPLANT
RELOAD STAPLE 60 4.3 GRN DVNC (STAPLE) IMPLANT
RELOAD STAPLER 2.5X60 WHT DVNC (STAPLE) ×1 IMPLANT
RELOAD STAPLER 3.5X45 BLU DVNC (STAPLE) IMPLANT
RELOAD STAPLER 3.5X60 BLU DVNC (STAPLE) ×2 IMPLANT
RELOAD STAPLER 4.3X45 GRN DVNC (STAPLE) IMPLANT
RELOAD STAPLER 4.3X60 GRN DVNC (STAPLE) IMPLANT
RETRACTOR WND ALEXIS 18 MED (MISCELLANEOUS) IMPLANT
RTRCTR WOUND ALEXIS 18CM MED (MISCELLANEOUS) ×1
SCISSORS LAP 5X35 DISP (ENDOMECHANICALS) ×1 IMPLANT
SEAL CANN UNIV 5-8 DVNC XI (MISCELLANEOUS) ×3 IMPLANT
SEAL XI 5MM-8MM UNIVERSAL (MISCELLANEOUS) ×3
SEALER VESSEL DA VINCI XI (MISCELLANEOUS) ×1
SEALER VESSEL EXT DVNC XI (MISCELLANEOUS) ×1 IMPLANT
SOL ELECTROSURG ANTI STICK (MISCELLANEOUS) ×1
SOLUTION ELECTROSURG ANTI STCK (MISCELLANEOUS) ×1 IMPLANT
SPIKE FLUID TRANSFER (MISCELLANEOUS) ×1 IMPLANT
STAPLER 45 DA VINCI SURE FORM (STAPLE)
STAPLER 45 SUREFORM DVNC (STAPLE) IMPLANT
STAPLER 60 DA VINCI SURE FORM (STAPLE) ×1
STAPLER 60 SUREFORM DVNC (STAPLE) IMPLANT
STAPLER CANNULA SEAL DVNC XI (STAPLE) ×1 IMPLANT
STAPLER CANNULA SEAL XI (STAPLE) ×1
STAPLER ECHELON POWER CIR 29 (STAPLE) IMPLANT
STAPLER ECHELON POWER CIR 31 (STAPLE) IMPLANT
STAPLER RELOAD 2.5X60 WHITE (STAPLE) ×1
STAPLER RELOAD 2.5X60 WHT DVNC (STAPLE) ×1
STAPLER RELOAD 3.5X45 BLU DVNC (STAPLE)
STAPLER RELOAD 3.5X45 BLUE (STAPLE)
STAPLER RELOAD 3.5X60 BLU DVNC (STAPLE) ×2
STAPLER RELOAD 3.5X60 BLUE (STAPLE) ×2
STAPLER RELOAD 4.3X45 GREEN (STAPLE)
STAPLER RELOAD 4.3X45 GRN DVNC (STAPLE)
STAPLER RELOAD 4.3X60 GREEN (STAPLE)
STAPLER RELOAD 4.3X60 GRN DVNC (STAPLE)
STOPCOCK 4 WAY LG BORE MALE ST (IV SETS) ×2 IMPLANT
SURGILUBE 2OZ TUBE FLIPTOP (MISCELLANEOUS) IMPLANT
SUT MNCRL AB 4-0 PS2 18 (SUTURE) ×1 IMPLANT
SUT PDS AB 1 CT1 27 (SUTURE) ×2 IMPLANT
SUT PROLENE 0 CT 2 (SUTURE) IMPLANT
SUT PROLENE 2 0 KS (SUTURE) IMPLANT
SUT PROLENE 2 0 SH DA (SUTURE) IMPLANT
SUT SILK 2 0 (SUTURE)
SUT SILK 2 0 SH CR/8 (SUTURE) IMPLANT
SUT SILK 2-0 18XBRD TIE 12 (SUTURE) IMPLANT
SUT SILK 3 0 (SUTURE)
SUT SILK 3 0 SH CR/8 (SUTURE) ×1 IMPLANT
SUT SILK 3-0 18XBRD TIE 12 (SUTURE) IMPLANT
SUT V-LOC BARB 180 2/0GR6 GS22 (SUTURE) ×2
SUT VIC AB 3-0 SH 18 (SUTURE) IMPLANT
SUT VIC AB 3-0 SH 27 (SUTURE)
SUT VIC AB 3-0 SH 27XBRD (SUTURE) IMPLANT
SUT VICRYL 0 UR6 27IN ABS (SUTURE) ×1 IMPLANT
SUTURE V-LC BRB 180 2/0GR6GS22 (SUTURE) IMPLANT
SYR 20ML ECCENTRIC (SYRINGE) ×1 IMPLANT
SYS LAPSCP GELPORT 120MM (MISCELLANEOUS)
SYS WOUND ALEXIS 18CM MED (MISCELLANEOUS)
SYSTEM LAPSCP GELPORT 120MM (MISCELLANEOUS) IMPLANT
SYSTEM WOUND ALEXIS 18CM MED (MISCELLANEOUS) ×1 IMPLANT
TOWEL OR NON WOVEN STRL DISP B (DISPOSABLE) ×1 IMPLANT
TRAY FOLEY MTR SLVR 16FR STAT (SET/KITS/TRAYS/PACK) ×1 IMPLANT
TROCAR ADV FIXATION 5X100MM (TROCAR) ×1 IMPLANT
TUBING CONNECTING 10 (TUBING) ×2 IMPLANT
TUBING INSUFFLATION 10FT LAP (TUBING) ×1 IMPLANT

## 2022-12-19 NOTE — H&P (Signed)
12/19/2022    REFERRING PHYSICIAN: Otis Brace, MD  Patient Care Team: Darron Doom, Leonia Reader, NP as PCP - General  PROVIDER: Hollace Kinnier, MD  [facility] MRN: [id number] DOB: 1966-06-25  SUBJECTIVE  Chief Complaint: New Consultation (Colon Cancer )   Abigail Yoder is a 57 y.o. 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 Found to have positive Cologuard. Underwent colonoscopy by Dr. Alessandra Bevels with [facility] gastroenterology. Few polyps removed. A more bulky mass was noted in the proximal transverse colon. Just beyond hepatic flexure by CT scan. Concern for cancer.  Patient recalls being called by Dr. Alessandra Bevels with that diagnosis. Normal CEA. Surgical consultation requested. She has a history of breast cancer diagnosed 2018. Prior lumpectomy with post adjuvant chemotherapy. Has been on tamoxifen. Insulin requiring diabetic with hypertension. Some chronic kidney disease. Patient had appendectomy tubal ligation and C-sections in the past.  She comes today by herself. Hemoglobin A1c running around 8. Moves her bowels every 1-2 days. Claims she can walk half hour easily. No history of cardiac or pulmonary issues. Does not smoke. No sleep apnea.  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 stage 3 secondary to diabetes (CMS-HCC) Insulin-requiring or dependent type II diabetes mellitus (CMS-HCC) Family history of breast cancer Genetic testing Essential hypertension Iron deficiency anemia Malignant neoplasm of upper-inner quadrant of left breast in female, estrogen receptor positive Morbid obesity with BMI of  40.0-44.9, adult (CMS-HCC) Malignant neoplasm of hepatic flexure (CMS-HCC) History of left breast cancer Personal history of tamoxifen therapy Obesity, Class II, BMI 35-39.9, unspecified CKD (chronic kidney disease) stage 4, GFR 15-29 ml/min (CMS-HCC)  Past Surgical History: Procedure Laterality Date MASTECTOMY PARTIAL / LUMPECTOMY 2017 APPENDECTOMY   Allergies Allergen Reactions Nsaids (Non-Steroidal Anti-Inflammatory Drug) Other (See Comments) CKD IV = no NSAIDs  Current Outpatient Medications on File Prior to Visit Medication Sig Dispense Refill amLODIPine (NORVASC) 10 MG tablet Take 1 tablet by mouth once daily hydrALAZINE (APRESOLINE) 25 MG tablet Take 1 tablet by mouth 3 (three) times daily NOVOLOG MIX 70-30FLEXPEN U-100 100 unit/mL (70-30) pen injector 45UAM/65UPM TWICE A DAY SUBCUTANEOUSLY 41 sodium bicarbonate 650 MG tablet Take 1 tablet by mouth 2 (two) times daily tamoxifen (NOLVADEX) 20 MG tablet Take 1 tablet by mouth once daily  No current facility-administered medications on file prior to visit.  History reviewed. No pertinent family history.  Social History  Tobacco Use Smoking Status Former Types: Cigarettes Quit date: 1997 Years since quitting: 26.9 Smokeless Tobacco Never   Social History  Socioeconomic History Marital status: Unknown Tobacco Use Smoking status: Former Types: Cigarettes Quit date: 1997 Years since quitting: 26.9 Smokeless tobacco: Never Substance and Sexual Activity Alcohol use: Not Currently Drug use: Never  ############################################################  Review of Systems: A complete review of systems (ROS) was obtained from the patient. We have reviewed this information and discussed as appropriate with the patient. See HPI as well for other pertinent ROS.  Constitutional: No fevers, chills, sweats. Weight stable Eyes: No vision changes, No discharge HENT: No sore throats, nasal drainage Lymph: No  neck swelling, No bruising easily Pulmonary: No cough, productive sputum CV: No orthopnea, PND . No exertional chest/neck/shoulder/arm pain. Patient can walk 30 minutes without difficulty.  GI: No personal nor family history of inflammatory bowel disease, irritable bowel syndrome, allergy such as Celiac Sprue, dietary/dairy problems, colitis, ulcers nor gastritis. No recent sick contacts/gastroenteritis. No travel outside the country. No changes in diet. No family history of colorectal cancer.  Renal: No UTIs, No hematuria Genital: No drainage, bleeding, masses Musculoskeletal: No severe joint pain. Good ROM major joints Skin: No sores or lesions Heme/Lymph: No easy bleeding. No swollen lymph nodes. Patient had sister with cancer of the breast. No other cancers that she is aware of Neuro: No active seizures. No facial droop Psych: No hallucinations. No agitation  OBJECTIVE  Vitals: 10/29/22 1325 10/29/22 1333 BP: (!) 180/60 Pulse: 92 Temp: 36.2 C (97.2 F) SpO2: 98% Weight: (!) 105.8 kg (233 lb 3.2 oz) Height: 161.3 cm (5' 3.5") PainSc: 0-No pain  Body mass index is 40.66 kg/m.  PHYSICAL EXAM:  Constitutional: Not cachectic. Hygeine adequate. Vitals signs as above. Eyes: No glasses. Vision adequate,Pupils reactive, normal extraocular movements. Sclera nonicteric Neuro: CN II-XII intact. No major focal sensory defects. No major motor deficits. Lymph: No head/neck/groin lymphadenopathy Psych: No severe agitation. No severe anxiety. Judgment & insight Adequate, Oriented x4, HENT: Normocephalic, Mucus membranes moist. No thrush. Hearing: adequate Neck: Supple, No tracheal deviation. No obvious thyromegaly Chest: No pain to chest wall compression. Good respiratory excursion. No audible wheezing CV: Pulses intact. regular. No major extremity edema Ext: No obvious deformity or contracture. Edema: Not present. No cyanosis Skin: No major subcutaneous nodules. Warm and  dry Musculoskeletal: Severe joint rigidity not present. No obvious clubbing. No digital petechiae. Mobility: no assist device moving easily without restrictions  Abdomen: Obese Soft. Nondistended. Nontender. Hernia: Not present. Diastasis recti: Not present. No hepatomegaly. No splenomegaly.  Genital/Pelvic: Inguinal hernia: Not present. Inguinal lymph nodes: without lymphadenopathy nor hidradenitis.  Rectal: (Deferred)    ###################################################################  Labs, Imaging and Diagnostic Testing:  Located in 'Care Everywhere' section of Epic EMR chart  CLINICAL DATA: Colon cancer staging. History of left breast cancer status post lumpectomy and node dissection. * Tracking Code: BO *  EXAM: CT CHEST, ABDOMEN AND PELVIS WITHOUT CONTRAST  TECHNIQUE: Multidetector CT imaging of the chest, abdomen and pelvis was performed following the standard protocol without IV contrast.  RADIATION DOSE REDUCTION: This exam was performed according to the departmental dose-optimization program which includes automated exposure control, adjustment of the mA and/or kV according to patient size and/or use of iterative reconstruction technique.  COMPARISON: None Available.  FINDINGS: CT CHEST FINDINGS  Cardiovascular: Normal heart size. No pericardial effusion. Aortic atherosclerosis and coronary artery calcifications.  Mediastinum/Nodes: Thyroid gland, trachea and esophagus are unremarkable. No enlarged supraclavicular, axillary, or mediastinal lymph nodes. The hilar lymph nodes are suboptimally evaluated due to lack of IV contrast material.  Lungs/Pleura: No pleural fluid. No airspace consolidation. Tiny perifissural nodule along the oblique fissure measures 3 mm, image 67/6. This most likely represents a benign intrapulmonary lymph node. No suspicious lung nodules identified at this time.  Musculoskeletal: There are postsurgical and post treatment  changes involving the left breast with overlying skin thickening and soft tissue stranding.  Status post ACDF within the lower cervical spine.  No acute or suspicious osseous findings.  CT ABDOMEN PELVIS FINDINGS  Hepatobiliary: No focal liver abnormality is seen. No gallstones, gallbladder wall thickening, or biliary dilatation.  Pancreas: Unremarkable. No pancreatic ductal dilatation or surrounding inflammatory changes.  Spleen: Normal in size without focal abnormality.  Adrenals/Urinary Tract: Normal adrenal glands. No nephrolithiasis, hydronephrosis or kidney mass. Gas  is identified within the non dependent portion of the urinary bladder. Etiology indeterminate.  Stomach/Bowel: Small hiatal hernia. Stomach otherwise unremarkable. Status post appendectomy. There is a focal area of circumferential luminal narrowing and wall thickening involving the hepatic flexure which measures 2.3 by 2.2 cm. This is of uncertain clinical significance and correlation with colonoscopy findings is advised.  Vascular/Lymphatic: Aortic atherosclerotic calcifications. No aneurysm. No enlarged abdominal lymph nodes.  Reproductive: Uterus and bilateral adnexa are unremarkable.  Other: No ascites. No focal fluid collections. No signs of peritoneal nodularity.  Musculoskeletal: No acute or suspicious osseous findings.  IMPRESSION: 1. No specific findings identified to suggest metastatic disease to the chest, abdomen or pelvis. 2. There is a focal area of circumferential luminal narrowing and wall thickening involving the hepatic flexure which is of uncertain clinical significance and correlation with colonoscopy findings is advised. 3. Gas is identified within the non dependent portion of the urinary bladder. Etiology indeterminate. Correlate for any history of recent instrumentation. If there is no history of recent instrumentation then findings are concerning for cystitis. 4. Aortic  Atherosclerosis (ICD10-I70.0).   Electronically Signed By: Kerby Moors M.D. On: 10/10/2022 09:2 PRIOR [facility] CLINIC NOTES:  Located in [place]' section of Epic EMR chart  SURGERY NOTES:  Located in [place]' section of Epic EMR chart  PATHOLOGY:  Not able to locate.  Assessment and Plan: DIAGNOSES:  Diagnoses and all orders for this visit:  Malignant neoplasm of hepatic flexure (CMS-HCC)  History of left breast cancer  Personal history of tamoxifen therapy  Obesity, Class II, BMI 35-39.9, unspecified  CKD (chronic kidney disease) stage 4, GFR 15-29 ml/min (CMS-HCC)  Insulin-requiring or dependent type II diabetes mellitus (CMS-HCC)  Other orders - polyethylene glycol (MIRALAX) powder; Take 233.75 g by mouth once for 1 dose Take according to your procedure prep instructions. - bisacodyL (DULCOLAX) 5 mg EC tablet; Take 4 tablets (20 mg total) by mouth once daily as needed for Constipation for up to 1 dose - metroNIDAZOLE (FLAGYL) 500 MG tablet; Take 2 tablets (1,000 mg total) by mouth 3 (three) times daily for 3 doses SEE BOWEL PREP INSTRUCTIONS: Take 2 tablets at 2pm, 3pm, and 10pm the day prior to your colon operation. - neomycin 500 mg tablet; Take 2 tablets (1,000 mg total) by mouth 3 (three) times daily for 3 doses SEE BOWEL PREP INSTRUCTIONS: Take 2 tablets at 2pm, 3pm, and 10pm the day prior to your colon operation.    ASSESSMENT/PLAN  Pleasant woman with positive Cologuard test and found to have mass just distal to the hepatic flexure in the very proximal transverse colon. By report adenocarcinoma. Seen on CAT scan. No metastatic disease. Normal CEA.  Standard of care is segmental colonic resection. The plan will be minimally invasive robotic approach with intracorporeal anastomosis.  The anatomy & physiology of the digestive tract was discussed. The pathophysiology of the colon was discussed. Natural history risks without surgery was discussed.  I feel the risks of no intervention will lead to serious problems that outweigh the operative risks; therefore, I recommended a partial colectomy to remove the pathology. Minimally invasive (Robotic/Laparoscopic) & open techniques were discussed.  Risks such as bleeding, infection, abscess, leak, reoperation, injury to other organs, need for repair of tissues / organs, possible ostomy, hernia, heart attack, stroke, death, and other risks were discussed. I noted a good likelihood this will help address the problem. Goals of post-operative recovery were discussed as well. Need for adequate nutrition, daily bowel regimen and  healthy physical activity, to optimize recovery was noted as well. We will work to minimize complications. Educational materials were available as well. Questions were answered. The patient expresses understanding & wishes to proceed with surgery.  She is an insulin requiring diabetic with chronic kidney disease with elevated creatinine. That does definitely increase her risk. However she has good compliance with medical care and good exercise activity. Hopefully her risk for surgery are low. She wishes to be aggressive and proceed.   Adin Hector, MD, FACS, MASCRS Esophageal, Gastrointestinal & Colorectal Surgery Robotic and Minimally Invasive Surgery  [facility] [address], [place], [place] [address] [phone] Fax [phone] Main  CONTACT INFORMATION:  Weekday (9AM-5PM): Call [facility] main office at [phone]  Weeknight (5PM-9AM) or Weekend/Holiday: Check [email or web address] (password " [email or web address]") for General Surgery [facility] coverage  (Please, do not use SecureChat as it is not reliable communication to reach operating surgeons for immediate patient care given surgeries/outpatient duties/clinic/cross-coverage/off post-call which would lead to a delay in care.  Epic staff messaging available for outptient  concerns, but may not be answered for 48 hours or more).    12/19/2022

## 2022-12-19 NOTE — Discharge Instructions (Signed)
SURGERY: POST OP INSTRUCTIONS (Surgery for small bowel obstruction, colon resection, etc)   ######################################################################  EAT Gradually transition to a high fiber diet with a fiber supplement over the next few days after discharge  WALK Walk an hour a day.  Control your pain to do that.    CONTROL PAIN Control pain so that you can walk, sleep, tolerate sneezing/coughing, go up/down stairs.  HAVE A BOWEL MOVEMENT DAILY Keep your bowels regular to avoid problems.  OK to try a laxative to override constipation.  OK to use an antidairrheal to slow down diarrhea.  Call if not better after 2 tries  CALL IF YOU HAVE PROBLEMS/CONCERNS Call if you are still struggling despite following these instructions. Call if you have concerns not answered by these instructions  ######################################################################   DIET Follow a light diet the first few days at home.  Start with a bland diet such as soups, liquids, starchy foods, low fat foods, etc.  If you feel full, bloated, or constipated, stay on a ful liquid or pureed/blenderized diet for a few days until you feel better and no longer constipated. Be sure to drink plenty of fluids every day to avoid getting dehydrated (feeling dizzy, not urinating, etc.). Gradually add a fiber supplement to your diet over the next week.  Gradually get back to a regular solid diet.  Avoid fast food or heavy meals the first week as you are more likely to get nauseated. It is expected for your digestive tract to need a few months to get back to normal.  It is common for your bowel movements and stools to be irregular.  You will have occasional bloating and cramping that should eventually fade away.  Until you are eating solid food normally, off all pain medications, and back to regular activities; your bowels will not be normal. Focus on eating a low-fat, high fiber diet the rest of your life  (See Getting to Good Bowel Health, below).  CARE of your INCISION or WOUND  It is good for closed incisions and even open wounds to be washed every day.  Shower every day.  Short baths are fine.  Wash the incisions and wounds clean with soap & water.    You may leave closed incisions open to air if it is dry.   You may cover the incision with clean gauze & replace it after your daily shower for comfort.  TEGADERM:  You have clear gauze band-aid dressings over your closed incision(s).  Remove the dressings 3 days after surgery.    If you have an open wound with a wound vac, see wound vac care instructions.    ACTIVITIES as tolerated Start light daily activities --- self-care, walking, climbing stairs-- beginning the day after surgery.  Gradually increase activities as tolerated.  Control your pain to be active.  Stop when you are tired.  Ideally, walk several times a day, eventually an hour a day.   Most people are back to most day-to-day activities in a few weeks.  It takes 4-8 weeks to get back to unrestricted, intense activity. If you can walk 30 minutes without difficulty, it is safe to try more intense activity such as jogging, treadmill, bicycling, low-impact aerobics, swimming, etc. Save the most intensive and strenuous activity for last (Usually 4-8 weeks after surgery) such as sit-ups, heavy lifting, contact sports, etc.  Refrain from any intense heavy lifting or straining until you are off narcotics for pain control.  You will have off days, but   things should improve week-by-week. DO NOT PUSH THROUGH PAIN.  Let pain be your guide: If it hurts to do something, don't do it.  Pain is your body warning you to avoid that activity for another week until the pain goes down. You may drive when you are no longer taking narcotic prescription pain medication, you can comfortably wear a seatbelt, and you can safely make sudden turns/stops to protect yourself without hesitating due to pain. You may  have sexual intercourse when it is comfortable. If it hurts to do something, stop.  MEDICATIONS Take your usually prescribed home medications unless otherwise directed.   Blood thinners:  Usually you can restart any strong blood thinners after the second postoperative day.  It is OK to take aspirin right away.     If you are on strong blood thinners (warfarin/Coumadin, Plavix, Xerelto, Eliquis, Pradaxa, etc), discuss with your surgeon, medicine PCP, and/or cardiologist for instructions on when to restart the blood thinner & if blood monitoring is needed (PT/INR blood check, etc).     PAIN CONTROL Pain after surgery or related to activity is often due to strain/injury to muscle, tendon, nerves and/or incisions.  This pain is usually short-term and will improve in a few months.  To help speed the process of healing and to get back to regular activity more quickly, DO THE FOLLOWING THINGS TOGETHER: Increase activity gradually.  DO NOT PUSH THROUGH PAIN Use Ice and/or Heat Try Gentle Massage and/or Stretching Take over the counter pain medication Take Narcotic prescription pain medication for more severe pain  Good pain control = faster recovery.  It is better to take more medicine to be more active than to stay in bed all day to avoid medications.  Increase activity gradually Avoid heavy lifting at first, then increase to lifting as tolerated over the next 6 weeks. Do not "push through" the pain.  Listen to your body and avoid positions and maneuvers than reproduce the pain.  Wait a few days before trying something more intense Walking an hour a day is encouraged to help your body recover faster and more safely.  Start slowly and stop when getting sore.  If you can walk 30 minutes without stopping or pain, you can try more intense activity (running, jogging, aerobics, cycling, swimming, treadmill, sex, sports, weightlifting, etc.) Remember: If it hurts to do it, then don't do it! Use Ice and/or  Heat You will have swelling and bruising around the incisions.  This will take several weeks to resolve. Ice packs or heating pads (6-8 times a day, 30-60 minutes at a time) will help sooth soreness & bruising. Some people prefer to use ice alone, heat alone, or alternate between ice & heat.  Experiment and see what works best for you.  Consider trying ice for the first few days to help decrease swelling and bruising; then, switch to heat to help relax sore spots and speed recovery. Shower every day.  Short baths are fine.  It feels good!  Keep the incisions and wounds clean with soap & water.   Try Gentle Massage and/or Stretching Massage at the area of pain many times a day Stop if you feel pain - do not overdo it Take over the counter pain medication This helps the muscle and nerve tissues become less irritable and calm down faster Choose ONE of the following over-the-counter anti-inflammatory medications: Acetaminophen 500mg tabs (Tylenol) 1-2 pills with every meal and just before bedtime (avoid if you have liver problems or   if you have acetaminophen in you narcotic prescription) Naproxen 220mg tabs (ex. Aleve, Naprosyn) 1-2 pills twice a day (avoid if you have kidney, stomach, IBD, or bleeding problems) Ibuprofen 200mg tabs (ex. Advil, Motrin) 3-4 pills with every meal and just before bedtime (avoid if you have kidney, stomach, IBD, or bleeding problems) Take with food/snack several times a day as directed for at least 2 weeks to help keep pain / soreness down & more manageable. Take Narcotic prescription pain medication for more severe pain A prescription for strong pain control is often given to you upon discharge (for example: oxycodone/Percocet, hydrocodone/Norco/Vicodin, or tramadol/Ultram) Take your pain medication as prescribed. Be mindful that most narcotic prescriptions contain Tylenol (acetaminophen) as well - avoid taking too much Tylenol. If you are having problems/concerns with  the prescription medicine (does not control pain, nausea, vomiting, rash, itching, etc.), please call us [phone] to see if we need to switch you to a different pain medicine that will work better for you and/or control your side effects better. If you need a refill on your pain medication, you must call the office before 4 pm and on weekdays only.  By federal law, prescriptions for narcotics cannot be called into a pharmacy.  They must be filled out on paper & picked up from our office by the patient or authorized caretaker.  Prescriptions cannot be filled after 4 pm nor on weekends.    WHEN TO CALL US [phone] Severe uncontrolled or worsening pain  Fever over 101 F (38.5 C) Concerns with the incision: Worsening pain, redness, rash/hives, swelling, bleeding, or drainage Reactions / problems with new medications (itching, rash, hives, nausea, etc.) Nausea and/or vomiting Difficulty urinating Difficulty breathing Worsening fatigue, dizziness, lightheadedness, blurred vision Other concerns If you are not getting better after two weeks or are noticing you are getting worse, contact our office [phone] for further advice.  We may need to adjust your medications, re-evaluate you in the office, send you to the emergency room, or see what other things we can do to help. The clinic staff is available to answer your questions during regular business hours (8:30am-5pm).  Please don't hesitate to call and ask to speak to one of our nurses for clinical concerns.    A surgeon from [facility] is always on call at the hospitals 24 hours/day If you have a medical emergency, go to the nearest emergency room or call 911.  FOLLOW UP in our office One the day of your discharge from the hospital (or the next business weekday), please call [facility] to set up or confirm an appointment to see your surgeon in the office for a follow-up appointment.  Usually it is 2-3 weeks  after your surgery.   If you have skin staples at your incision(s), let the office know so we can set up a time in the office for the nurse to remove them (usually around 10 days after surgery). Make sure that you call for appointments the day of discharge (or the next business weekday) from the hospital to ensure a convenient appointment time. IF YOU HAVE DISABILITY OR FAMILY LEAVE FORMS, BRING THEM TO THE OFFICE FOR PROCESSING.  DO NOT GIVE THEM TO YOUR DOCTOR.  [facility] [address], [address], [place], [place]  [address] ? [phone] - Main [phone] - Toll Free,  [phone] - Fax [email or web address]    GETTING TO GOOD BOWEL HEALTH. It is expected for your   digestive tract to need a few months to get back to normal.  It is common for your bowel movements and stools to be irregular.  You will have occasional bloating and cramping that should eventually fade away.  Until you are eating solid food normally, off all pain medications, and back to regular activities; your bowels will not be normal.   Avoiding constipation The goal: ONE SOFT BOWEL MOVEMENT A DAY!    Drink plenty of fluids.  Choose water first. TAKE A FIBER SUPPLEMENT EVERY DAY THE REST OF YOUR LIFE During your first week back home, gradually add back a fiber supplement every day Experiment which form you can tolerate.   There are many forms such as powders, tablets, wafers, gummies, etc Psyllium bran (Metamucil), methylcellulose (Citrucel), Miralax or Glycolax, Benefiber, Flax Seed.  Adjust the dose week-by-week (1/2 dose/day to 6 doses a day) until you are moving your bowels 1-2 times a day.  Cut back the dose or try a different fiber product if it is giving you problems such as diarrhea or bloating. Sometimes a laxative is needed to help jump-start bowels if constipated until the fiber supplement can help regulate your bowels.  If you are tolerating eating & you are farting, it  is okay to try a gentle laxative such as double dose MiraLax, prune juice, or Milk of Magnesia.  Avoid using laxatives too often. Stool softeners can sometimes help counteract the constipating effects of narcotic pain medicines.  It can also cause diarrhea, so avoid using for too long. If you are still constipated despite taking fiber daily, eating solids, and a few doses of laxatives, call our office. Controlling diarrhea Try drinking liquids and eating bland foods for a few days to avoid stressing your intestines further. Avoid dairy products (especially milk & ice cream) for a short time.  The intestines often can lose the ability to digest lactose when stressed. Avoid foods that cause gassiness or bloating.  Typical foods include beans and other legumes, cabbage, broccoli, and dairy foods.  Avoid greasy, spicy, fast foods.  Every person has some sensitivity to other foods, so listen to your body and avoid those foods that trigger problems for you. Probiotics (such as active yogurt, Align, etc) may help repopulate the intestines and colon with normal bacteria and calm down a sensitive digestive tract Adding a fiber supplement gradually can help thicken stools by absorbing excess fluid and retrain the intestines to act more normally.  Slowly increase the dose over a few weeks.  Too much fiber too soon can backfire and cause cramping & bloating. It is okay to try and slow down diarrhea with a few doses of antidiarrheal medicines.   Bismuth subsalicylate (ex. Kayopectate, Pepto Bismol) for a few doses can help control diarrhea.  Avoid if pregnant.   Loperamide (Imodium) can slow down diarrhea.  Start with one tablet (2mg) first.  Avoid if you are having fevers or severe pain.  ILEOSTOMY PATIENTS WILL HAVE CHRONIC DIARRHEA since their colon is not in use.    Drink plenty of liquids.  You will need to drink even more glasses of water/liquid a day to avoid getting dehydrated. Record output from your  ileostomy.  Expect to empty the bag every 3-4 hours at first.  Most people with a permanent ileostomy empty their bag 4-6 times at the least.   Use antidiarrheal medicine (especially Imodium) several times a day to avoid getting dehydrated.  Start with a dose at bedtime & breakfast.    Adjust up or down as needed.  Increase antidiarrheal medications as directed to avoid emptying the bag more than 8 times a day (every 3 hours). Work with your wound ostomy nurse to learn care for your ostomy.  See ostomy care instructions. TROUBLESHOOTING IRREGULAR BOWELS 1) Start with a soft & bland diet. No spicy, greasy, or fried foods.  2) Avoid gluten/wheat or dairy products from diet to see if symptoms improve. 3) Miralax 17gm or flax seed mixed in 8oz. water or juice-daily. May use 2-4 times a day as needed. 4) Gas-X, Phazyme, etc. as needed for gas & bloating.  5) Prilosec (omeprazole) over-the-counter as needed 6)  Consider probiotics (Align, Activa, etc) to help calm the bowels down  Call your doctor if you are getting worse or not getting better.  Sometimes further testing (cultures, endoscopy, X-ray studies, CT scans, bloodwork, etc.) may be needed to help diagnose and treat the cause of the diarrhea. [facility] [address], [address], [place], [place]  [address] [phone] - Main.    [phone]  - Toll Free.   [phone] - Fax [email or web address]  

## 2022-12-19 NOTE — Transfer of Care (Signed)
Immediate Anesthesia Transfer of Care Note  Patient: Abigail Yoder  Procedure(s) Performed: Procedure(s): ROBOTIC COLECTOMY PROXIMAL WITH BILATERAL TAP BLOCK AND ASSESSMENT OF PERFUSION USING FIREFLY (N/A)  Patient Location: PACU  Anesthesia Type:General  Level of Consciousness:  sedated, patient cooperative and responds to stimulation  Airway & Oxygen Therapy:Patient Spontanous Breathing and Patient connected to face mask oxgen  Post-op Assessment:  Report given to PACU RN and Post -op Vital signs reviewed and stable  Post vital signs:  Reviewed and stable  Last Vitals:  Vitals:   12/19/22 0633  BP: (!) 170/81  Pulse: 88  Resp: 16  Temp: 37.3 C  SpO2: 44%    Complications: No apparent anesthesia complications

## 2022-12-19 NOTE — Anesthesia Procedure Notes (Signed)
Procedure Name: Intubation Date/Time: 12/19/2022 8:20 AM  Performed by: Lavina Hamman, CRNAPre-anesthesia Checklist: Patient identified, Emergency Drugs available, Suction available, Patient being monitored and Timeout performed Patient Re-evaluated:Patient Re-evaluated prior to induction Oxygen Delivery Method: Circle system utilized Preoxygenation: Pre-oxygenation with 100% oxygen Induction Type: IV induction Ventilation: Mask ventilation without difficulty Laryngoscope Size: Mac and 3 Grade View: Grade II Tube type: Oral Tube size: 7.0 mm Number of attempts: 1 Airway Equipment and Method: Stylet Placement Confirmation: ETT inserted through vocal cords under direct vision, positive ETCO2, CO2 detector and breath sounds checked- equal and bilateral Secured at: 21 cm Tube secured with: Tape Dental Injury: Teeth and Oropharynx as per pre-operative assessment  Comments: ATOI

## 2022-12-19 NOTE — Interval H&P Note (Signed)
History and Physical Interval Note:  12/19/2022 7:20 AM  Abigail Yoder  has presented today for surgery, with the diagnosis of COLON CANCER.  The various methods of treatment have been discussed with the patient and family. After consideration of risks, benefits and other options for treatment, the patient has consented to  Procedure(s): ROBOTIC COLECTOMY PROXIMAL (N/A) as a surgical intervention.  The patient's history has been reviewed, patient examined, no change in status, stable for surgery.  I have reviewed the patient's chart and labs.  Questions were answered to the patient's satisfaction.    I have re-reviewed the the patient's records, history, medications, and allergies.  I have re-examined the patient.  I again discussed intraoperative plans and goals of post-operative recovery.  The patient agrees to proceed.  Abigail Yoder  Aug 23, 1966 [id number]  Patient Care Team: [name], [name] as PCP - General (Internal Medicine) Eppie Gibson, MD as Attending Physician (Radiation Oncology) Jacelyn Pi, MD as Consulting Physician (Endocrinology) Ardis Hughs, MD as Attending Physician (Urology) Delice Bison, Charlestine Massed, NP as Nurse Practitioner (Hematology and Oncology) Andee Lineman, NP as Nurse Practitioner (Nephrology) Michael Boston, MD as Consulting Physician (General Surgery)  Patient Active Problem List   Diagnosis Date Noted   CKD stage 3 secondary to diabetes ([place]) 01/04/2020   Morbid obesity with BMI of 40.0-44.9, adult ([place]) 03/18/2018   Genetic testing 12/18/2016   Family history of breast cancer    Malignant neoplasm of upper-inner quadrant of left breast in female, estrogen receptor positive ([place]) 11/27/2016   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 Left Breast boost 2 Gy in 5 fractions   Hypertension    Obesity    Sleep apnea    NO CPAP    Past Surgical History:  Procedure Laterality Date   APPENDECTOMY     BREAST LUMPECTOMY WITH RADIOACTIVE SEED AND SENTINEL LYMPH NODE BIOPSY Left 12/31/2016   Procedure: BREAST LUMPECTOMY WITH RADIOACTIVE SEED AND SENTINEL LYMPH NODE BIOPSY;  Surgeon: Alphonsa Overall, MD;  Location: [facility];  Service: General;  Laterality: Left;   CERVICAL SPINE SURGERY     CESAREAN SECTION     IR REMOVAL TUN ACCESS W/ PORT W/O FL MOD SED  10/18/2017   KNEE SURGERY Left    PORTACATH PLACEMENT N/A 12/31/2016   Procedure: INSERTION PORT-A-CATH WITH [place];  Surgeon: Alphonsa Overall, MD;  Location: [facility];  Service: General;  Laterality: N/A;   TUBAL LIGATION      Social History   Socioeconomic History   Marital status: Legally Separated    Spouse name: Not on file   Number of children: 2   Years of education: Not on file   Highest education level: Not on file  Occupational History   Not on file  Tobacco Use   Smoking status: Former    Types: Cigarettes    Quit date: 11/19/1994    Years since quitting:  28.1   Smokeless tobacco: Never   Tobacco comments:    Quit over 24 years ago  Vaping Use   Vaping Use: Never used  Substance and Sexual Activity   Alcohol use: No   Drug use: No   Sexual activity: Yes    Birth control/protection: Surgical  Other Topics Concern   Not on file  Social History Narrative   Not on file   Social Determinants of Health   Financial Resource Strain: Not on file  Food Insecurity: Not on file  Transportation Needs: Not on file  Physical Activity: Not on  file  Stress: Not on file  Social Connections: Not on file  Intimate Partner Violence: Not on file    Family History  Problem Relation Age of Onset   Diabetes Mother    Arthritis Mother    Hypertension Father    Heart disease Father    Hyperlipidemia Father    Breast cancer Sister 77       came back 65 years later   Stroke Maternal Uncle    Stroke Maternal Grandmother     Medications Prior to Admission  Medication Sig Dispense Refill Last Dose   amLODipine (NORVASC) 10 MG tablet Take 1 tablet (10 mg total) by mouth daily.   12/19/2022 at 0505   carvedilol (COREG) 25 MG tablet Take 25 mg by mouth 2 (two) times daily.   12/19/2022 at 0505   hydrALAZINE (APRESOLINE) 50 MG tablet Take 50 mg by mouth 3 (three) times daily.   12/19/2022 at [place] MIX 70/30 FLEXPEN (70-30) 100 UNIT/ML FlexPen Inject 10-15 Units into the skin See admin instructions. Inject 15 units into the skin in the morning before breakfast, 10 units before lunch and 15 units before dinner   12/18/2022 at 1945   sodium bicarbonate 650 MG tablet Take 650 mg by mouth 2 (two) times daily.   12/18/2022   tamoxifen (NOLVADEX) 20 MG tablet Take 1 tablet (20 mg total) by mouth daily. 90 tablet 4 12/19/2022 at 0505    Current Facility-Administered Medications  Medication Dose Route Frequency Provider Last Rate Last Admin   bisacodyl (DULCOLAX) EC tablet 20 mg  20 mg Oral Once Michael Boston, MD       bupivacaine liposome (EXPAREL) 1.3 % injection 266 mg  20 mL Infiltration Once Michael Boston, MD       cefoTEtan (CEFOTAN) 2 g in sodium chloride 0.9 % 100 mL IVPB  2 g Intravenous On Call to OR Michael Boston, MD       Derrill Memo ON 12/20/2022] feeding supplement (ENSURE PRE-SURGERY) liquid 296 mL  296 mL Oral Once Michael Boston, MD       feeding supplement (ENSURE PRE-SURGERY) liquid 592 mL  592 mL Oral Once Michael Boston, MD       lactated ringers infusion   Intravenous Continuous Barnet Glasgow, MD       scopolamine  (TRANSDERM-SCOP) 1 MG/3DAYS 1.5 mg  1 patch Transdermal Q72H Effie Berkshire, MD         Allergies  Allergen Reactions   Nsaids Other (See Comments)    Hx CKD = NO NSAIDs   Metformin Hcl Er Other (See Comments)    BP (!) 170/81   Pulse 88   Temp 99.1 F (37.3 C) (Oral)   Resp 16   Ht '5\' 3"'$  (1.6 m)   Wt 103.9 kg   LMP 08/19/2016 (Approximate)   SpO2 99%   BMI 40.57 kg/m  Labs: Results for orders placed or performed during the hospital encounter of 12/19/22 (from the past 48 hour(s))  Glucose, capillary     Status: Abnormal   Collection Time: 12/19/22  6:26 AM  Result Value Ref Range   Glucose-Capillary 127 (H) 70 - 99 mg/dL    Comment: Glucose reference range applies only to samples taken after fasting for at least 8 hours.    Imaging / Studies: No results found.   Adin Hector, M.D., F.A.C.S. Gastrointestinal and Minimally Invasive Surgery [facility] [address] [place], [place] [address] [phone] Main / Paging  12/19/2022 7:20 AM    Adin Hector

## 2022-12-19 NOTE — Op Note (Signed)
12/19/2022  10:44 AM  PATIENT:  Abigail Yoder  57 y.o. female  Patient Care Team: [name], [name] as PCP - General (Internal Medicine) Eppie Gibson, MD as Attending Physician (Radiation Oncology) Jacelyn Pi, MD as Consulting Physician (Endocrinology) Ardis Hughs, MD as Attending Physician (Urology) Delice Bison, Charlestine Massed, NP as Nurse Practitioner (Hematology and Oncology) Andee Lineman, NP as Nurse Practitioner (Nephrology) Michael Boston, MD as Consulting Physician (General Surgery)  PRE-OPERATIVE DIAGNOSIS:  CANCER OF HEPATIC FLEXURE OF COLON  POST-OPERATIVE DIAGNOSIS:  CANCER OF HEPATIC FLEXURE OF COLON  PROCEDURE:   -ROBOTIC COLECTOMY PROXIMAL -INTRAOPERATIVE ASSESSMENT OF TISSUE VASCULAR PERFUSION USING ICG (indocyanine green) IMMUNOFLUORESCENCE -TRANSVERSUS ABDOMINIS PLANE (TAP) BLOCK - BILATERAL  SURGEON:  Adin Hector, MD  ASSISTANT: Nadeen Landau, MD An experienced assistant was required given the standard of surgical care given the complexity of the case.  This assistant was needed for exposure, dissection, suction, tissue approximation, retraction, perception, etc.  ANESTHESIA:     General  Regional TRANSVERSUS ABDOMINIS PLANE (TAP) nerve block for perioperative & postoperative pain control provided with liposomal bupivacaine (Experel) mixed with 0.25% bupivacaine as a Bilateral TAP block x 92m each side at the level of the transverse abdominis & preperitoneal spaces along the flank at the anterior axillary line, from subcostal ridge to iliac crest under laparoscopic guidance   Local field block at port sites & extraction wound  EBL:  Total I/O In: 700 [I.V.:700] Out: 400 [Urine:250; Blood:150]  Delay start of Pharmacological VTE agent (>24hrs) due to surgical blood loss or risk of bleeding:  no  DRAINS: none   SPECIMEN: Proximal right colon from terminal ileum to mid transverse colon  DISPOSITION OF SPECIMEN:   PATHOLOGY  COUNTS:  YES  PLAN OF CARE: Admit to inpatient   PATIENT DISPOSITION:  PACU - hemodynamically stable.  INDICATION:    57year old woman with diabetes and chronic kidney disease found to have mass  Eksir the colon near the gallbladder.  And no carcinoma biopsied by colonoscopy.  No evidence of any obvious metastatic disease.  I recommended segmental resection:  The anatomy & physiology of the digestive tract was discussed.  The pathophysiology was discussed.  Natural history risks without surgery was discussed.   I worked to give an overview of the disease and the frequent need to have multispecialty involvement.  I feel the risks of no intervention will lead to serious problems that outweigh the operative risks; therefore, I recommended a partial colectomy to remove the pathology.  Laparoscopic & open techniques were discussed.   Risks such as bleeding, infection, abscess, leak, reoperation, possible ostomy, hernia, heart attack, death, and other risks were discussed.  I noted a good likelihood this will help address the problem.   Goals of post-operative recovery were discussed as well.  We will work to minimize complications.  An educational handout on the pathology was given as well.  Questions were answered.    The patient expresses understanding & wishes to proceed with surgery.  OR FINDINGS:   Patient had firm fibrotic tumor in the proximal transverse colon just past the hepatic flexure just proximal to obvious tattooing.  No obvious metastatic disease on visceral parietal peritoneum or liver.  It is an isoperistaltic ileocolonic anastomosis that rests in the periumbilical region.  CASE DATA:  Type of patient?: Elective [facility] Private Case  Status of Case? Elective Scheduled  Infection Present At Time Of Surgery (PATOS)?  NO    DESCRIPTION:   Informed consent  was confirmed.  The patient underwent general anaesthesia without difficulty.  The patient was positioned  with arms tucked & secured appropriately.  VTE prevention in place.  The patient's abdomen was clipped, prepped, & draped in a sterile fashion.  Surgical timeout confirmed our plan.  The patient was positioned in reverse Trendelenburg.  Abdominal entry was gained using Varess technique at the subcostal ridge on the anterior abdominal wall.  No elevated EtCO2 noted.  Port placed.  Camera inspection revealed no injury.  There is some adhesions to free the greater omentum around the periumbilical and suprapubic abdomen.  Extra ports were carefully placed under direct laparoscopic visualization.  We docked the [employer] Vinci robot carefully and placed intstruments under visualization  Could see tattooing in the proximal transverse colon just past the turn of the hepatic flexure near the level of the gallbladder.  This correlated with suspected area on CAT scan.  We proceed with resection.  I mobilized & reflected the greater omentum in the upper abdomen.  Position the small bowel into the left lower quadrant and pelvis.  I was able to elevate the proximal colon to isolate the ileocolonic pedicle.  I scored the ileal mesentery just proximal to that.   I carried that further dissection in a medial to lateral fashion.  I was able to bluntly get into the retro-mesenteric plane on the right side.  I freed the proximal right sided colonic mesentery off the retroperitoneum including the duodenal sweep, pancreatic head, & Gerota's fascia of the right kidney. I was able to get underneath the hepatic flexure.  Able to free the transverse colon off the retroperitoneum and around the hepatic flexure.  I was able to get underneath the proximal and mid transverse colon.  I isolated the proximal ileocecal pedicle.  I skeletonized it & transected the vessels.    I then proceeded to mobilize the terminal ileum & proximal "right" colon in a lateral to medial fashion.  I mobilized the distal ileal mesentery off its retroperitoneal  and pelvic attachments.  I mobilized the ascending colon off It is side wall attachments to the paracolic gutter and retroperitoneum.  I also mobilized the greater omentum off the mid transverse colon and mobilized the mid to proximal transverse colon in a superior to inferior fashion.  This allowed me to mobilize the hepatic flexure and get a complete mobilization of the proximal "right" colon to the mid-transverse colon.   I could isolate the pathology.  I ended up transecting the transverse colon mesentery radially taking a moderate branch of the right middle colic artery since this was at the proximal transverse colon.  Work to spare a dominant middle middle colic arterial pedicle.  Transected through the terminal ileal mesentery about 5 cm proximal to the ileocecal valve.  Given her significant kidney disease and insulin requiring diabetes, I decided to double check we had decent perfusion on both ends.  To access vascular perfusion of tissues, we asked anesthesia use intravenous  indocyanine green (ICG) with IV flush.  I switched to the NIR fluorescence (Firefly mode) imaging window on the daVinci robot platform.  We were able to see good light green visualization of blood vessels with good vascular perfusion of tissues, confirming good tissue perfusion of tissues  planned for anastomosis at the terminal ileum and mid transverse colon.  We then went ahead and proceeded with transection.  We transected at the distal ileum with a robotic stapler 77m blue load.  We then transected transverse colon  with a robotic stapler 39m blue load.  We confirmed hemostasis.     I did a side-to-side stapled anastomosis of ileum to mid-transverse colon using a 633mwhite load in an isoperistaltic fashion.  (Distal stump of ileum to mid transverse colon for the distal end of the anastomosis.  Proximal end of colon stump to more proximal ileum for the proximal end of the anastomosis).  I sewed the common staple channel wound  with an absorbable suture (V-lock) in a running [place] from each corner and meeting in the center.  I did meticulous inspection prove an airtight closure.  I protected the anastomosis line with an anterior omentopexy of greater omentum using V lock suture.    We did reinspection of the abdomen.  Hemostasis was good.   Ureters, retroperitoneum, and bowel uninjured.  The anastomosis looked healthy.   Endoluminal gas was evacuated.  We placed the wound protector through the suprapubic 1274mort site after it was enlarged in a Pfannenstiel fashion.  Specimen removed without incident.   Ports & wound protector removed.  Hemostasis was good.  Sterile unused instruments were used from this point.  I closed the skin at the port sites using Monocryl stitch and sterile dressing.  I closed the extraction wound using a 0 Vicryl vertical peritoneal closure and a #1 PDS transverse anterior rectal fascial closure like a small Pfannenstiel closure. I closed the skin with some interrupted Monocryl stitches.  I placed sterile dressings.     Patient is being extubated go to recovery room. I discussed postop care with the patient in detail the office & in the holding area. Instructions are written. I discussed operative findings, updated the patient's status, discussed probable steps to recovery, and gave postoperative recommendations to the patient's daughter, BriBhutan Recommendations were made.  Questions were answered.  She expressed understanding & appreciation.   SteAdin Hector.D., F.A.C.S. Gastrointestinal and Minimally Invasive Surgery [facility] [id number] LakeeWhite HouseC [phone] / Paging

## 2022-12-19 NOTE — Anesthesia Preprocedure Evaluation (Addendum)
Anesthesia Evaluation  Patient identified by MRN, date of birth, ID band Patient awake    Reviewed: Allergy & Precautions, NPO status , Patient's Chart, lab work & pertinent test results  Airway Mallampati: III  TM Distance: >3 FB Neck ROM: Full    Dental  (+) Teeth Intact, Dental Advisory Given   Pulmonary sleep apnea , former smoker   breath sounds clear to auscultation       Cardiovascular hypertension,  Rhythm:Regular Rate:Normal     Neuro/Psych negative neurological ROS  negative psych ROS   GI/Hepatic negative GI ROS, Neg liver ROS,,,  Endo/Other  diabetes    Renal/GU Renal disease     Musculoskeletal negative musculoskeletal ROS (+)    Abdominal   Peds  Hematology negative hematology ROS (+)   Anesthesia Other Findings   Reproductive/Obstetrics                             Anesthesia Physical Anesthesia Plan  ASA: 3  Anesthesia Plan: General   Post-op Pain Management: Tylenol PO (pre-op)* and Gabapentin PO (pre-op)*   Induction: Intravenous  PONV Risk Score and Plan: 4 or greater and Ondansetron, Dexamethasone, Midazolam and Scopolamine patch - Pre-op  Airway Management Planned: Oral ETT  Additional Equipment: None  Intra-op Plan:   Post-operative Plan: Extubation in OR  Informed Consent: I have reviewed the patients History and Physical, chart, labs and discussed the procedure including the risks, benefits and alternatives for the proposed anesthesia with the patient or authorized representative who has indicated his/her understanding and acceptance.     Dental advisory given  Plan Discussed with: CRNA  Anesthesia Plan Comments:        Anesthesia Quick Evaluation

## 2022-12-20 ENCOUNTER — Encounter: Payer: Self-pay | Admitting: Adult Health

## 2022-12-20 LAB — CBC
HCT: 21.8 % — ABNORMAL LOW (ref 36.0–46.0)
Hemoglobin: 7 g/dL — ABNORMAL LOW (ref 12.0–15.0)
MCH: 29.5 pg (ref 26.0–34.0)
MCHC: 32.1 g/dL (ref 30.0–36.0)
MCV: 92 fL (ref 80.0–100.0)
Platelets: 128 10*3/uL — ABNORMAL LOW (ref 150–400)
RBC: 2.37 MIL/uL — ABNORMAL LOW (ref 3.87–5.11)
RDW: 15.7 % — ABNORMAL HIGH (ref 11.5–15.5)
WBC: 7.3 10*3/uL (ref 4.0–10.5)
nRBC: 0 % (ref 0.0–0.2)

## 2022-12-20 LAB — GLUCOSE, CAPILLARY
Glucose-Capillary: 292 mg/dL — ABNORMAL HIGH (ref 70–99)
Glucose-Capillary: 237 mg/dL — ABNORMAL HIGH (ref 70–99)
Glucose-Capillary: 165 mg/dL — ABNORMAL HIGH (ref 70–99)
Glucose-Capillary: 220 mg/dL — ABNORMAL HIGH (ref 70–99)

## 2022-12-20 LAB — BASIC METABOLIC PANEL
Anion gap: 12 (ref 5–15)
BUN: 43 mg/dL — ABNORMAL HIGH (ref 6–20)
CO2: 16 mmol/L — ABNORMAL LOW (ref 22–32)
Calcium: 8 mg/dL — ABNORMAL LOW (ref 8.9–10.3)
Chloride: 104 mmol/L (ref 98–111)
Creatinine, Ser: 4.2 mg/dL — ABNORMAL HIGH (ref 0.44–1.00)
GFR, Estimated: 12 mL/min — ABNORMAL LOW (ref >60)
Glucose, Bld: 279 mg/dL — ABNORMAL HIGH (ref 70–99)
Potassium: 5.1 mmol/L (ref 3.5–5.1)
Sodium: 132 mmol/L — ABNORMAL LOW (ref 135–145)

## 2022-12-20 LAB — MAGNESIUM: Magnesium: 1.8 mg/dL (ref 1.7–2.4)

## 2022-12-20 MED ORDER — INSULIN ASPART PROT & ASPART (70-30 MIX) 100 UNIT/ML ~~LOC~~ SUSP
15.0000 [IU] | Freq: Two times a day (BID) | SUBCUTANEOUS | Status: DC
  Administered 2022-12-20 – 2022-12-23 (×6): 15 [IU] via SUBCUTANEOUS
  Filled 2022-12-20: qty 10

## 2022-12-20 MED ORDER — SODIUM CHLORIDE 0.9 % IV SOLN
125.0000 mg | Freq: Once | INTRAVENOUS | Status: AC
  Administered 2022-12-20: 125 mg via INTRAVENOUS
  Filled 2022-12-20: qty 10

## 2022-12-20 MED ORDER — INSULIN ASPART PROT & ASPART (70-30 MIX) 100 UNIT/ML PEN: 10.0000 [IU] | PEN_INJECTOR | SUBCUTANEOUS | Status: DC

## 2022-12-20 MED ORDER — INSULIN ASPART PROT & ASPART (70-30 MIX) 100 UNIT/ML ~~LOC~~ SUSP
10.0000 [IU] | Freq: Every day | SUBCUTANEOUS | Status: DC
  Administered 2022-12-20 – 2022-12-23 (×4): 10 [IU] via SUBCUTANEOUS

## 2022-12-20 MED ORDER — HEPARIN SODIUM (PORCINE) 5000 UNIT/ML IJ SOLN
5000.0000 [IU] | Freq: Three times a day (TID) | INTRAMUSCULAR | Status: DC
  Filled 2022-12-20: qty 1

## 2022-12-20 NOTE — Progress Notes (Signed)
Abigail Yoder [id number] 1966-02-23  CARE TEAM:  PCP: [name], [name]  Outpatient Care Team: Patient Care Team: [name], [name] as PCP - General (Internal Medicine) Eppie Gibson, MD as Attending Physician (Radiation Oncology) Jacelyn Pi, MD as Consulting Physician (Endocrinology) Ardis Hughs, MD as Attending Physician (Urology) Delice Bison, Charlestine Massed, NP as Nurse Practitioner (Hematology and Oncology) Andee Lineman, NP as Nurse Practitioner (Nephrology) Michael Boston, MD as Consulting Physician (General Surgery)  Inpatient Treatment Team: Treatment Team: Attending Provider: Michael Boston, MD; Registered Nurse: Chucky May, RN; Registered Nurse: Jennye Boroughs, RN; Technician: Leda Quail, NT; Utilization Review: Fortino Sic, RN; Pharmacist: Royetta Asal, [facility]; Student Nurse: Ethelene Hal, Student-RN   Problem List:   Principal Problem:   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 Active Problems:   CKD (chronic kidney disease) stage 4, GFR 15-29 ml/min ([place])   Insulin-requiring or dependent type II diabetes mellitus ([place])   Iron deficiency anemia   Essential hypertension   Morbid obesity with BMI of 40.0-44.9, adult [facility])   Personal history of malignant neoplasm of breast   Anemia   Sleep apnea   Cancer of hepatic flexure ([place])   1 Day Post-Op  12/19/2022  OST-OPERATIVE DIAGNOSIS:  CANCER OF HEPATIC FLEXURE OF COLON   PROCEDURE:   -ROBOTIC COLECTOMY PROXIMAL -INTRAOPERATIVE ASSESSMENT OF TISSUE VASCULAR PERFUSION USING ICG (indocyanine green) IMMUNOFLUORESCENCE -TRANSVERSUS ABDOMINIS PLANE (TAP) BLOCK - BILATERAL   SURGEON:  Adin Hector, MD  OR FINDINGS:    Patient had firm fibrotic tumor in the proximal transverse colon just past the hepatic flexure just proximal to obvious tattooing.   No obvious metastatic disease on visceral parietal peritoneum or liver.    It is an isoperistaltic ileocolonic anastomosis that rests in the periumbilical region.  Assessment  Recovering well so far  [facility] Stay = 1 days)  Plan:  ERAS enhance recovery protocol.  Advance diet as tolerated.  Diabetic control.  Rather hyperglycemic.  Restart home NovoLog.  Continue sliding scale for now.  Bowel regimen with fiber.  Severe chronic kidney disease with elevated creatinine mid threes to 4 but nonoliguric and stable.  Follow.  Iron deficiency anemia with acute postoperative blood loss anemia.  Hemoglobin 7.  Hold heparin anticoagulation x 48 hours.  Giving IV iron.  Follow.  If less than 7 or symptomatic, may need blood transfusion.  She consents if needed.  Try and hold off for now.  Hypertension control.  Hold if soft given anemia.  History of sleep apnea.  Oxygen nightly as needed.  -VTE prophylaxis- SCDs, etc  -mobilize as tolerated to help recovery  Disposition:  Disposition:  The patient is from: Home  Anticipate discharge to:  Home  Anticipated Date of Discharge is:  February 2,2024    Barriers to discharge:  Pending Clinical improvement (more likely than not)  Patient currently is NOT MEDICALLY STABLE for discharge from the hospital from a surgery standpoint.      I reviewed nursing notes, last 24 h vitals and pain scores, last 48 h intake and output, last 24 h labs and trends, and last 24 h imaging results. I have reviewed this patient's available data, including medical history, events of note, test results, etc as part of my evaluation.  A significant portion of that time was spent in counseling.  Care during the described time interval was provided by me.  This care required moderate level of medical decision making.  12/20/2022  Subjective: (Chief complaint)  Nursing in room.  Patient walked in hallways last night.  Tolerating clears.  Getting into try dysphagia 1/full liquid diet.  Some liquid bowel movements.  No  major bleeding.  Denies any pain.  Objective:  Vital signs:  Vitals:   12/19/22 2152 12/20/22 0125 12/20/22 0300 12/20/22 0456  BP: (!) 189/76 133/65  (!) 158/74  Pulse: 85 78  80  Resp: '18 16  18  '$ Temp: 97.8 F (36.6 C) 97.9 F (36.6 C)  97.9 F (36.6 C)  TempSrc:      SpO2: 98% 98%  99%  Weight:   104.2 kg   Height:        Last BM Date : 12/19/22  Intake/Output   Yesterday:  01/31 0701 - 02/01 0700 In: 1312.8 [P.O.:120; I.V.:1192.8] Out: 1300 [Urine:1150; Blood:150] This shift:  No intake/output data recorded.  Bowel function:  Flatus: YES  BM:  YES  Drain: (No drain)   Physical Exam:  General: Pt awake/alert in no acute distress.  Sitting up smiling.  Not toxic.  Not sickly. Eyes: PERRL, normal EOM.  Sclera clear.  No icterus Neuro: CN II-XII intact w/o focal sensory/motor deficits. Lymph: No head/neck/groin lymphadenopathy Psych:  No delerium/psychosis/paranoia.  Oriented x 4 HENT: Normocephalic, Mucus membranes moist.  No thrush Neck: Supple, No tracheal deviation.  No obvious thyromegaly Chest: No pain to chest wall compression.  Good respiratory excursion.  No audible wheezing CV:  Pulses intact.  Regular rhythm.  No major extremity edema MS: Normal AROM mjr joints.  No obvious deformity  Abdomen: Soft.  Nondistended.  Nontender.  No evidence of peritonitis.  Dressings with minimal thinly serosanguineous staining left lower quadrant but no active bleeding.  No incarcerated hernias.  Ext:   No deformity.  No mjr edema.  No cyanosis Skin: No petechiae / purpurea.  No major sores.  Warm and dry    Results:   Cultures: No results found for this or any previous visit (from the past 720 hour(s)).  Labs: Results for orders placed or performed during the hospital encounter of 12/19/22 (from the past 48 hour(s))  Glucose, capillary     Status: Abnormal   Collection Time: 12/19/22  6:26 AM  Result Value Ref Range   Glucose-Capillary 127 (H) 70 - 99  mg/dL    Comment: Glucose reference range applies only to samples taken after fasting for at least 8 hours.  Glucose, capillary     Status: Abnormal   Collection Time: 12/19/22 11:05 AM  Result Value Ref Range   Glucose-Capillary 235 (H) 70 - 99 mg/dL    Comment: Glucose reference range applies only to samples taken after fasting for at least 8 hours.  Glucose, capillary     Status: Abnormal   Collection Time: 12/19/22 12:18 PM  Result Value Ref Range   Glucose-Capillary 212 (H) 70 - 99 mg/dL    Comment: Glucose reference range applies only to samples taken after fasting for at least 8 hours.  Glucose, capillary     Status: Abnormal   Collection Time: 12/19/22  4:47 PM  Result Value Ref Range   Glucose-Capillary 195 (H) 70 - 99 mg/dL    Comment: Glucose reference range applies only to samples taken after fasting for at least 8 hours.  Glucose, capillary     Status: Abnormal   Collection Time: 12/19/22  9:55 PM  Result Value Ref Range   Glucose-Capillary 330 (H) 70 - 99 mg/dL    Comment: Glucose  reference range applies only to samples taken after fasting for at least 8 hours.  CBC     Status: Abnormal   Collection Time: 12/20/22  4:46 AM  Result Value Ref Range   WBC 7.3 4.0 - 10.5 K/uL   RBC 2.37 (L) 3.87 - 5.11 MIL/uL   Hemoglobin 7.0 (L) 12.0 - 15.0 g/dL   HCT 21.8 (L) 36.0 - 46.0 %   MCV 92.0 80.0 - 100.0 fL   MCH 29.5 26.0 - 34.0 pg   MCHC 32.1 30.0 - 36.0 g/dL   RDW 15.7 (H) 11.5 - 15.5 %   Platelets 128 (L) 150 - 400 K/uL   nRBC 0.0 0.0 - 0.2 %    Comment: Performed at [facility], [place] [address], [place], [place] [address]  Magnesium     Status: None   Collection Time: 12/20/22  4:46 AM  Result Value Ref Range   Magnesium 1.8 1.7 - 2.4 mg/dL    Comment: Performed at [facility], [place] [address] [place], [place] [address]  Basic metabolic panel     Status: Abnormal   Collection Time: 12/20/22  4:46 AM  Result Value Ref Range    Sodium 132 (L) 135 - 145 mmol/L   Potassium 5.1 3.5 - 5.1 mmol/L   Chloride 104 98 - 111 mmol/L   CO2 16 (L) 22 - 32 mmol/L   Glucose, Bld 279 (H) 70 - 99 mg/dL    Comment: Glucose reference range applies only to samples taken after fasting for at least 8 hours.   BUN 43 (H) 6 - 20 mg/dL   Creatinine, Ser 4.20 (H) 0.44 - 1.00 mg/dL   Calcium 8.0 (L) 8.9 - 10.3 mg/dL   GFR, Estimated 12 (L) >60 mL/min    Comment: (NOTE) Calculated using the CKD-EPI Creatinine Equation (2021)    Anion gap 12 5 - 15    Comment: Performed at [facility], [place] [address] [place],  [address]  Glucose, capillary     Status: Abnormal   Collection Time: 12/20/22  7:36 AM  Result Value Ref Range   Glucose-Capillary 292 (H) 70 - 99 mg/dL    Comment: Glucose reference range applies only to samples taken after fasting for at least 8 hours.    Imaging / Studies: No results found.  Medications / Allergies: per chart  Antibiotics: Anti-infectives (From admission, onward)    Start     Dose/Rate Route Frequency Ordered Stop   12/19/22 2000  cefoTEtan (CEFOTAN) 2 g in sodium chloride 0.9 % 100 mL IVPB        2 g 200 mL/hr over 30 Minutes Intravenous Every 12 hours 12/19/22 1507 12/19/22 2038   12/19/22 1400  neomycin (MYCIFRADIN) tablet 1,000 mg  Status:  Discontinued       See Hyperspace for full Linked Orders Report.   1,000 mg Oral 3 times per day 12/19/22 0614 12/19/22 0620   12/19/22 1400  metroNIDAZOLE (FLAGYL) tablet 1,000 mg  Status:  Discontinued       See Hyperspace for full Linked Orders Report.   1,000 mg Oral 3 times per day 12/19/22 0614 12/19/22 0620   12/19/22 0615  cefoTEtan (CEFOTAN) 2 g in sodium chloride 0.9 % 100 mL IVPB        2 g 200 mL/hr over 30 Minutes Intravenous On call to O.R. 12/19/22 9242 12/19/22 6834         Note: Portions of this report  may have been transcribed using voice recognition software. Every effort was made to ensure accuracy;  however, inadvertent computerized transcription errors may be present.   Any transcriptional errors that result from this process are unintentional.    Adin Hector, MD, FACS, MASCRS Esophageal, Gastrointestinal & Colorectal Surgery Robotic and Minimally Invasive Surgery  [facility]. [address] [place], [place] [address] [phone] Fax [phone] Main  CONTACT INFORMATION:  Weekday (9AM-5PM): Call [facility] main office at [phone]  Weeknight (5PM-9AM) or Weekend/Holiday: Check [email or web address] (password " [email or web address]") for General Surgery [facility] coverage  (Please, do not use SecureChat as it is not reliable communication to reach operating surgeons for immediate patient care given surgeries/outpatient duties/clinic/cross-coverage/off post-call which would lead to a delay in care.  Epic staff messaging available for outptient concerns, but may not be answered for 48 hours or more).     12/20/2022  7:56 AM

## 2022-12-20 NOTE — Anesthesia Postprocedure Evaluation (Signed)
Anesthesia Post Note  Patient: Abigail Yoder  Procedure(s) Performed: ROBOTIC COLECTOMY PROXIMAL WITH BILATERAL TAP BLOCK AND ASSESSMENT OF PERFUSION USING FIREFLY (Abdomen)     Patient location during evaluation: PACU Anesthesia Type: General Level of consciousness: awake and alert Pain management: pain level controlled Vital Signs Assessment: post-procedure vital signs reviewed and stable Respiratory status: spontaneous breathing, nonlabored ventilation, respiratory function stable and patient connected to nasal cannula oxygen Cardiovascular status: blood pressure returned to baseline and stable Postop Assessment: no apparent nausea or vomiting Anesthetic complications: no   No notable events documented.  Last Vitals:  Vitals:   12/20/22 0125 12/20/22 0456  BP: 133/65 (!) 158/74  Pulse: 78 80  Resp: 16 18  Temp: 36.6 C 36.6 C  SpO2: 98% 99%    Last Pain:  Vitals:   12/19/22 2200  TempSrc:   PainSc: 0-No pain                 Effie Berkshire

## 2022-12-21 ENCOUNTER — Encounter: Payer: Self-pay | Admitting: Adult Health

## 2022-12-21 DIAGNOSIS — C183 Malignant neoplasm of hepatic flexure: Secondary | ICD-10-CM

## 2022-12-21 LAB — BASIC METABOLIC PANEL
Anion gap: 9 (ref 5–15)
BUN: 46 mg/dL — ABNORMAL HIGH (ref 6–20)
CO2: 19 mmol/L — ABNORMAL LOW (ref 22–32)
Calcium: 8.2 mg/dL — ABNORMAL LOW (ref 8.9–10.3)
Chloride: 108 mmol/L (ref 98–111)
Creatinine, Ser: 4.92 mg/dL — ABNORMAL HIGH (ref 0.44–1.00)
GFR, Estimated: 10 mL/min — ABNORMAL LOW (ref >60)
Glucose, Bld: 206 mg/dL — ABNORMAL HIGH (ref 70–99)
Potassium: 4.5 mmol/L (ref 3.5–5.1)
Sodium: 136 mmol/L (ref 135–145)

## 2022-12-21 LAB — GLUCOSE, CAPILLARY
Glucose-Capillary: 207 mg/dL — ABNORMAL HIGH (ref 70–99)
Glucose-Capillary: 221 mg/dL — ABNORMAL HIGH (ref 70–99)
Glucose-Capillary: 136 mg/dL — ABNORMAL HIGH (ref 70–99)
Glucose-Capillary: 166 mg/dL — ABNORMAL HIGH (ref 70–99)

## 2022-12-21 LAB — HEMOGLOBIN: Hemoglobin: 7.1 g/dL — ABNORMAL LOW (ref 12.0–15.0)

## 2022-12-21 MED ORDER — FERROUS SULFATE 325 (65 FE) MG PO TABS
325.0000 mg | ORAL_TABLET | Freq: Two times a day (BID) | ORAL | Status: DC
  Administered 2022-12-21 – 2022-12-23 (×5): 325 mg via ORAL
  Filled 2022-12-21 (×5): qty 1

## 2022-12-21 MED ORDER — TRAMADOL HCL 50 MG PO TABS: 50.0000 mg | ORAL_TABLET | Freq: Four times a day (QID) | ORAL | 0 refills | Status: AC | PRN

## 2022-12-21 NOTE — Progress Notes (Signed)
PHARMACIST - PHYSICIAN COMMUNICATION  CONCERNING: Alvimopan   RECOMMENDATION: This patient is receiving alvimopan post-operatively.  Based on criteria approved by the Pharmacy and Therapeutics Committee, the medication will be discontinued.  DESCRIPTION: These criteria include: Patient will receive NO MORE than 15 doses total during current hospitalization If bowel recovery (documented return of bowel sounds and a bowel movement confirmed by RN or patient) occurs before completion of 7 days of therapy, a pharmacist may discontinue alvimopan  If you have questions about this conversion, please contact the [place], PharmD, BCPS Clinical Pharmacist 12/21/2022 7:57 AM

## 2022-12-21 NOTE — Progress Notes (Signed)
Abigail Yoder [id number] October 18, 1966  CARE TEAM:  PCP: [name], [name]  Outpatient Care Team: Patient Care Team: [name], [name] as PCP - General (Internal Medicine) Eppie Gibson, MD as Attending Physician (Radiation Oncology) Jacelyn Pi, MD as Consulting Physician (Endocrinology) Ardis Hughs, MD as Attending Physician (Urology) Delice Bison, Charlestine Massed, NP as Nurse Practitioner (Hematology and Oncology) Andee Lineman, NP as Nurse Practitioner (Nephrology) Michael Boston, MD as Consulting Physician (General Surgery)  Inpatient Treatment Team: Treatment Team: Attending Provider: Michael Boston, MD; Charge Nurse: Heloise Ochoa, RN; Utilization Review: Fortino Sic, RN; Technician: Tollie Eth, NT; Pharmacist: Tawnya Crook, [facility]; Licensed Practical Nurse: Wright, [place] E, LPN   Problem List:   Principal Problem:   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 Active Problems:   CKD (chronic kidney disease) stage 4, GFR 15-29 ml/min ([place])   Insulin-requiring or dependent type II diabetes mellitus ([place])   Iron deficiency anemia   Essential hypertension   Morbid obesity with BMI of 40.0-44.9, adult [facility])   Personal history of malignant neoplasm of breast   Anemia   Sleep apnea   Cancer of hepatic flexure ([place])   2 Days Post-Op  12/19/2022  OST-OPERATIVE DIAGNOSIS:  CANCER OF HEPATIC FLEXURE OF COLON   PROCEDURE:   -ROBOTIC COLECTOMY PROXIMAL -INTRAOPERATIVE ASSESSMENT OF TISSUE VASCULAR PERFUSION USING ICG (indocyanine green) IMMUNOFLUORESCENCE -TRANSVERSUS ABDOMINIS PLANE (TAP) BLOCK - BILATERAL   SURGEON:  Adin Hector, MD  OR FINDINGS:    Patient had firm fibrotic tumor in the proximal transverse colon just past the hepatic flexure just proximal to obvious tattooing.   No obvious metastatic disease on visceral parietal peritoneum or liver.   It is an isoperistaltic ileocolonic  anastomosis that rests in the periumbilical region.  Assessment  Recovering well so far  [facility] Stay = 2 days)  Plan:  ERAS enhanced recovery protocol.  Tolerating soft diet.  Advance to heart diet.  Diabetic control.  Rather hyperglycemic.  Restart home NovoLog.  Continue sliding scale for now.  Bowel regimen with fiber.  Add oral iron to naturally constipate.  Severe chronic kidney disease with elevated creatinine 3.5 -4.2 but nonoliguric and stable.  Follow.  Iron deficiency anemia with acute postoperative blood loss anemia.  Hemoglobin 7.  Hold heparin anticoagulation x 48 hours.  Received IV iron 2/1.  Hemoglobin low but stable.  Add oral iron.  If less than 7 or symptomatic, may need blood transfusion.  She consents if needed.  Try and hold off for now.  Hypertension control.  Hold if soft given anemia.  History of sleep apnea.  Oxygen nightly as needed.  -VTE prophylaxis- SCDs, etc  -mobilize as tolerated to help recovery.  Walking well in hallways a reassuring sign.  Most likely can leave later today/this afternoon.  Disposition:  Disposition:  The patient is from: Home  Anticipate discharge to:  Home  Anticipated Date of Discharge is:  February 2,2024    Barriers to discharge:  Pending Clinical improvement (more likely than not)  Patient currently is NOT MEDICALLY STABLE for discharge from the hospital from a surgery standpoint.      I reviewed nursing notes, last 24 h vitals and pain scores, last 48 h intake and output, last 24 h labs and trends, and last 24 h imaging results. I have reviewed this patient's available data, including medical history, events of note, test results, etc as part of my evaluation.  A significant portion of  that time was spent in counseling.  Care during the described time interval was provided by me.  This care required moderate level of medical decision making.  12/21/2022    Subjective: (Chief complaint)  Patient with  loose bowel movements.  Some concern but no incontinence.  Better this morning.  Tolerated solid diet last night.  No nausea or vomiting.  Tolerating solid diet.  Nursing feels patient close to getting home.  Objective:  Vital signs:  Vitals:   12/20/22 1429 12/20/22 2115 12/21/22 0500 12/21/22 0619  BP: 119/63 133/65  (!) 150/71  Pulse: 79 92  82  Resp: '18 18  18  '$ Temp: 98.7 F (37.1 C) 98.6 F (37 C)  98.2 F (36.8 C)  TempSrc: Oral Oral  Oral  SpO2: 98% 95%  100%  Weight:   105.1 kg   Height:        Last BM Date : 12/20/22  Intake/Output   Yesterday:  02/01 0701 - 02/02 0700 In: 971.9 [P.O.:960; IV Piggyback:11.9] Out: 500 [Urine:500] This shift:  No intake/output data recorded.  Bowel function:  Flatus: YES  BM:  YES  Drain: (No drain)   Physical Exam:  General: Pt awake/alert in no acute distress.  Walking in hallways without distress or guarding.  Not toxic.  Not sickly. Eyes: PERRL, normal EOM.  Sclera clear.  No icterus Neuro: CN II-XII intact w/o focal sensory/motor deficits. Lymph: No head/neck/groin lymphadenopathy Psych:  No delerium/psychosis/paranoia.  Oriented x 4 HENT: Normocephalic, Mucus membranes moist.  No thrush Neck: Supple, No tracheal deviation.  No obvious thyromegaly Chest: No pain to chest wall compression.  Good respiratory excursion.  No audible wheezing CV:  Pulses intact.  Regular rhythm.  No major extremity edema MS: Normal AROM mjr joints.  No obvious deformity  Abdomen: Soft.  Nondistended.  Nontender.  No evidence of peritonitis.  Dressings with minimal thinly serosanguineous staining left lower quadrant but no active bleeding.  No incarcerated hernias.  Ext:   No deformity.  No mjr edema.  No cyanosis Skin: No petechiae / purpurea.  No major sores.  Warm and dry    Results:   Cultures: No results found for this or any previous visit (from the past 720 hour(s)).  Labs: Results for orders placed or performed  during the hospital encounter of 12/19/22 (from the past 48 hour(s))  Surgical pathology     Status: None   Collection Time: 12/19/22  9:11 AM  Result Value Ref Range   SURGICAL PATHOLOGY      SURGICAL PATHOLOGY CASE: [id number] PATIENT: Abigail Yoder Surgical Pathology Report     Clinical History: Colon cancer (crm)     FINAL MICROSCOPIC DIAGNOSIS:  A. COLON, PROXIMAL, RESECTION: Invasive colonic adenocarcinoma, 4 cm Carcinoma extends into pericolonic connective tissue All surgical margins negative for carcinoma Twenty lymph nodes negative for metastatic carcinoma (0/20) See oncology table  ONCOLOGY TABLE:   COLON AND RECTUM, CARCINOMA:  Resection Procedure: Proximal colectomy Tumor Site: Proximal transverse colon Tumor Size: 4 x 2.5 cm Macroscopic Tumor Perforation: Not identified Histologic Type: Colorectal adenocarcinoma, not otherwise specified Histologic Grade: Moderately differentiated Multiple Primary Sites: Not applicable Tumor Extension: Into pericolonic connective tissue Lymphovascular Invasion: Not identified Perineural Invasion: Not identified Treatment Effect: No known presurgical therapy Margins:      Margin Status for In vasive Carcinoma: All margins negative for carcinoma Regional Lymph Nodes:      Number of Lymph Nodes with Tumor: 0      Number of  Lymph Nodes Examined: 20 Tumor Deposits: Not identified Distant Metastasis:      Distant Site(s) Involved: Not applicable Pathologic Stage Classification (pTNM, AJCC 8th Edition): pT3, pN0 Ancillary Studies: MMR and MSI testing are pending Representative Tumor Block: A4-A7 (v4.2.0.1)  Ricke Kimoto DESCRIPTION: Specimen: Proximal colon, received fresh Specimen integrity: Intact Specimen length: The specimen consists of 11 cm of terminal ileum and 46 cm of colon. Tumor location: Proximal transverse colon Tumor size: The tumor consists of a 4 x 2.5 cm ulcerated, indurated tan-red sessile mass measuring  1 cm in maximum thickness. Percent of bowel circumference involved: 75% Tumor distance to margins:                      Proximal: 31 cm                      Distal: 18 cm                      Mesenteric (sigmoid and transverse): 6 cm Macroscop ic extent of tumor invasion: The tumor involves the full-thickness of the wall and focally extends into the adjacent soft tissue. Total presumed lymph nodes: There are 21 rubbery ovoid nodules grossly consistent with lymph nodes measuring 0.3 to 0.7 cm. Extramural satellite tumor nodules: Not grossly identified. Mucosal polyp(s): Not grossly identified. Additional findings: The uninvolved mucosa is glistening and tan.  A 0.9 x 0.7 cm stump of a appendix is present. Block summary: 11 blocks submitted 1 = proximal margin 2 = distal margin 3 = tumor with inked serosa 4-7 = tumor 8 = appendix 9-11 = lymph nodes (GRP 12/19/2022)  Final Diagnosis performed by Claudette Laws, MD.   Electronically signed 12/20/2022 Technical component performed at [facility] [address] [place], [place] [address].  Professional component performed at [employer]. [facility], [place] [address], [place], [place] [address].  Immunohistochemistry Technical component  (if applicable) was performed at [facility]. [address], [place], [place], [place] [address].   IMMUNOHISTOCHEMISTRY DISCLAIMER (if applicable): Some of these immunohistochemical stains may have been developed and the performance characteristics determine by [facility]. Some may not have been cleared or approved by the U.S. Food and Drug Administration. The FDA has determined that such clearance or approval is not necessary. This test is used for clinical purposes. It should not be regarded as investigational or for research. This laboratory is certified under the [place] (CLIA-88) as qualified to  perform high complexity clinical laboratory testing.  The controls stained appropriately.   Glucose, capillary     Status: Abnormal   Collection Time: 12/19/22 11:05 AM  Result Value Ref Range   Glucose-Capillary 235 (H) 70 - 99 mg/dL    Comment: Glucose reference range applies only to samples taken after fasting for at least 8 hours.  Glucose, capillary     Status: Abnormal   Collection Time: 12/19/22 12:18 PM  Result Value Ref Range   Glucose-Capillary 212 (H) 70 - 99 mg/dL    Comment: Glucose reference range applies only to samples taken after fasting for at least 8 hours.  Glucose, capillary     Status: Abnormal   Collection Time: 12/19/22  4:47 PM  Result Value Ref Range   Glucose-Capillary 195 (H) 70 - 99 mg/dL    Comment: Glucose reference range applies only to samples taken after fasting for at least 8 hours.  Glucose, capillary     Status: Abnormal   Collection Time: 12/19/22  9:55 PM  Result Value Ref Range   Glucose-Capillary 330 (H) 70 - 99 mg/dL    Comment: Glucose reference range applies only to samples taken after fasting for at least 8 hours.  CBC     Status: Abnormal   Collection Time: 12/20/22  4:46 AM  Result Value Ref Range   WBC 7.3 4.0 - 10.5 K/uL   RBC 2.37 (L) 3.87 - 5.11 MIL/uL   Hemoglobin 7.0 (L) 12.0 - 15.0 g/dL   HCT 21.8 (L) 36.0 - 46.0 %   MCV 92.0 80.0 - 100.0 fL   MCH 29.5 26.0 - 34.0 pg   MCHC 32.1 30.0 - 36.0 g/dL   RDW 15.7 (H) 11.5 - 15.5 %   Platelets 128 (L) 150 - 400 K/uL   nRBC 0.0 0.0 - 0.2 %    Comment: Performed at [facility], [place] [address], [place], [place] [address]  Magnesium     Status: None   Collection Time: 12/20/22  4:46 AM  Result Value Ref Range   Magnesium 1.8 1.7 - 2.4 mg/dL    Comment: Performed at [facility], [place] [address] [place], [place] [address]  Basic metabolic panel     Status: Abnormal   Collection Time: 12/20/22  4:46 AM  Result Value Ref Range   Sodium 132 (L)  135 - 145 mmol/L   Potassium 5.1 3.5 - 5.1 mmol/L   Chloride 104 98 - 111 mmol/L   CO2 16 (L) 22 - 32 mmol/L   Glucose, Bld 279 (H) 70 - 99 mg/dL    Comment: Glucose reference range applies only to samples taken after fasting for at least 8 hours.   BUN 43 (H) 6 - 20 mg/dL   Creatinine, Ser 4.20 (H) 0.44 - 1.00 mg/dL   Calcium 8.0 (L) 8.9 - 10.3 mg/dL   GFR, Estimated 12 (L) >60 mL/min    Comment: (NOTE) Calculated using the CKD-EPI Creatinine Equation (2021)    Anion gap 12 5 - 15    Comment: Performed at [facility], [place] [address], [place], [place] [address]  Glucose, capillary     Status: Abnormal   Collection Time: 12/20/22  7:36 AM  Result Value Ref Range   Glucose-Capillary 292 (H) 70 - 99 mg/dL    Comment: Glucose reference range applies only to samples taken after fasting for at least 8 hours.  Glucose, capillary     Status: Abnormal   Collection Time: 12/20/22 11:57 AM  Result Value Ref Range   Glucose-Capillary 237 (H) 70 - 99 mg/dL    Comment: Glucose reference range applies only to samples taken after fasting for at least 8 hours.  Glucose, capillary     Status: Abnormal   Collection Time: 12/20/22  4:41 PM  Result Value Ref Range   Glucose-Capillary 165 (H) 70 - 99 mg/dL    Comment: Glucose reference range applies only to samples taken after fasting for at least 8 hours.  Glucose, capillary     Status: Abnormal   Collection Time: 12/20/22  9:55 PM  Result Value Ref Range   Glucose-Capillary 220 (H) 70 - 99 mg/dL    Comment: Glucose reference range applies only to samples taken after fasting for at least 8 hours.  Basic metabolic panel     Status: Abnormal   Collection Time: 12/21/22  4:00 AM  Result Value Ref  Range   Sodium 136 135 - 145 mmol/L   Potassium 4.5 3.5 - 5.1 mmol/L   Chloride 108 98 - 111 mmol/L   CO2 19 (L) 22 - 32 mmol/L   Glucose, Bld 206 (H) 70 - 99 mg/dL    Comment: Glucose reference range applies only to samples taken  after fasting for at least 8 hours.   BUN 46 (H) 6 - 20 mg/dL   Creatinine, Ser 4.92 (H) 0.44 - 1.00 mg/dL   Calcium 8.2 (L) 8.9 - 10.3 mg/dL   GFR, Estimated 10 (L) >60 mL/min    Comment: (NOTE) Calculated using the CKD-EPI Creatinine Equation (2021)    Anion gap 9 5 - 15    Comment: Performed at [facility], [place] [address], [place], [place] [address]  Hemoglobin     Status: Abnormal   Collection Time: 12/21/22  4:00 AM  Result Value Ref Range   Hemoglobin 7.1 (L) 12.0 - 15.0 g/dL    Comment: Performed at [facility], [place] [address], [place], [place] [address]    Imaging / Studies: No results found.  Medications / Allergies: per chart  Antibiotics: Anti-infectives (From admission, onward)    Start     Dose/Rate Route Frequency Ordered Stop   12/19/22 2000  cefoTEtan (CEFOTAN) 2 g in sodium chloride 0.9 % 100 mL IVPB        2 g 200 mL/hr over 30 Minutes Intravenous Every 12 hours 12/19/22 1507 12/19/22 2038   12/19/22 1400  neomycin (MYCIFRADIN) tablet 1,000 mg  Status:  Discontinued       See Hyperspace for full Linked Orders Report.   1,000 mg Oral 3 times per day 12/19/22 0614 12/19/22 0620   12/19/22 1400  metroNIDAZOLE (FLAGYL) tablet 1,000 mg  Status:  Discontinued       See Hyperspace for full Linked Orders Report.   1,000 mg Oral 3 times per day 12/19/22 0614 12/19/22 0620   12/19/22 0615  cefoTEtan (CEFOTAN) 2 g in sodium chloride 0.9 % 100 mL IVPB        2 g 200 mL/hr over 30 Minutes Intravenous On call to O.R. 12/19/22 9528 12/19/22 4132         Note: Portions of this report may have been transcribed using voice recognition software. Every effort was made to ensure accuracy; however, inadvertent computerized transcription errors may be present.   Any transcriptional errors that result from this process are unintentional.    Adin Hector, MD, FACS, MASCRS Esophageal, Gastrointestinal & Colorectal Surgery Robotic  and Minimally Invasive Surgery  [place]. [address], [place], [place] [address] [phone] Fax [phone] Main  CONTACT INFORMATION:  Weekday (9AM-5PM): Call [facility] main office at [phone]  Weeknight (5PM-9AM) or Weekend/Holiday: Check [email or web address] (password " [email or web address]") for General Surgery [facility] coverage  (Please, do not use SecureChat as it is not reliable communication to reach operating surgeons for immediate patient care given surgeries/outpatient duties/clinic/cross-coverage/off post-call which would lead to a delay in care.  Epic staff messaging available for outptient concerns, but may not be answered for 48 hours or more).     12/21/2022  7:37 AM

## 2022-12-21 NOTE — Discharge Summary (Signed)
Physician Discharge Summary    Patient ID: Abigail Yoder MRN: [id number] DOB/AGE: 1966/11/16  57 y.o.  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 Active Problems:   CKD (chronic kidney disease) stage 4, GFR 15-29 ml/min (HCC)   Insulin-requiring or dependent type II diabetes mellitus ([place])   Iron deficiency anemia   Essential hypertension   Morbid obesity with BMI of 40.0-44.9, adult [facility])   Personal history of malignant neoplasm of breast   Anemia   Sleep apnea   Cancer of hepatic flexure ([place])   2 Days Post-Op  12/19/2022  POST-OPERATIVE DIAGNOSIS:  CANCER OF HEPATIC FLEXURE OF COLON   PROCEDURE:   -ROBOTIC COLECTOMY PROXIMAL -INTRAOPERATIVE ASSESSMENT OF TISSUE VASCULAR PERFUSION USING ICG (indocyanine green) IMMUNOFLUORESCENCE -TRANSVERSUS ABDOMINIS PLANE (TAP) BLOCK - BILATERAL   SURGEON:  Adin Hector, MD   OR FINDINGS:    Patient had firm fibrotic tumor in the proximal transverse colon just past the hepatic flexure just proximal to obvious tattooing.   No obvious metastatic disease on visceral parietal peritoneum or liver.   It is an isoperistaltic ileocolonic anastomosis that rests in the periumbilical region.    Consults: Case Management / Social Work and Anesthesia  Hospital Course:   The patient underwent  the surgery above.  Postoperatively, the patient  gradually mobilized and advanced to a solid diet.  Pain and other symptoms were treated aggressively.  Pathology came back consistent with pT3 pN0 (0/20 lymph nodes), consistent with stage II tumor.  Discussed with patient.  She required a transfusion for Hgb down to 6.7 so was kept an extra day  By the time of discharge, the patient was walking well the hallways, eating food, having flatus.  She had some loose bowel movements that improved.  She still has fairly controlled diabetes but was relatively controlled with sliding scale and home NovoLog.  Elevated creatinine but nonoliguric.  Postoperative acute blood loss anemia in the setting of anemia of chronic disease and iron deficiency anemia -stable.  Given IV iron.  Held off on transfusion.  Pain was well-controlled on an oral medications.  Based on meeting discharge criteria and continuing to recover, I felt it was safe for the patient to be discharged from the hospital to further recover with close followup. Postoperative recommendations were discussed in detail.  They are written as well.  Discharged Condition: good  Discharge Exam: Blood pressure (!) 150/71, pulse 82, temperature 98.2 F (36.8 C), temperature source Oral, resp. rate 18, height '5\' 3"'$  (1.6 m), weight 105.1 kg, last menstrual period 08/19/2016, SpO2 100 %.  General: Pt awake/alert/oriented x4 in No acute distress Eyes: PERRL, normal EOM.  Sclera clear.  No icterus Neuro: CN II-XII intact w/o focal sensory/motor deficits. Lymph: No head/neck/groin lymphadenopathy Psych:  No delerium/psychosis/paranoia HENT: Normocephalic, Mucus membranes moist.  No thrush Neck: Supple, No tracheal deviation Chest:  No chest wall pain w good excursion CV:  Pulses intact.  Regular rhythm MS: Normal AROM mjr joints.  No obvious  deformity Abdomen: Soft.  Nondistended.  Nontender.  Dressings clean dry and intact.  No evidence of peritonitis.  No incarcerated hernias. Ext:  SCDs BLE.  No mjr  edema.  No cyanosis Skin: No petechiae / purpura   Disposition:    Follow-up Information     Michael Boston, MD Follow up on 01/07/2023.   Specialties: General Surgery, Colon and Rectal Surgery Why: To follow up after your operation Contact information: [place] [address] [phone]                 Discharge disposition: 01-Home or Self Care       Discharge Instructions     Call MD for:   Complete by: As directed    FEVER > 101.5 F  (temperatures < 101.5 F are not significant)   Call MD for:  extreme fatigue   Complete by: As directed    Call MD for:  persistant dizziness or light-headedness   Complete by: As directed    Call MD for:  persistant nausea and vomiting   Complete by: As directed    Call MD for:  redness, tenderness, or signs of infection (pain, swelling, redness, odor or green/yellow discharge around incision site)   Complete by: As directed    Call MD for:  severe uncontrolled pain   Complete by: As directed    Diet - low sodium heart healthy   Complete by: As directed    Start with a bland diet such as soups, liquids, starchy foods, low fat foods, etc. the first few days at home. Gradually advance to a solid, low-fat, high fiber diet by the end of the first week at home.   Add a fiber supplement to your diet (Metamucil, etc) If you feel full, bloated, or constipated, stay on a full liquid or pureed/blenderized diet for a few days until you feel better and are no longer constipated.   Discharge instructions   Complete by: As directed    See Discharge Instructions If you are not getting better after two weeks or are noticing you are getting worse, contact our office [phone] for further advice.  We may need to adjust your medications, re-evaluate you in the office, send you to the emergency room, or see what other things we can do to help. The clinic staff is available to answer your questions during regular  business hours (8:30am-5pm).  Please don't hesitate to call and ask to speak to one of our nurses for clinical concerns.    A surgeon from [facility] is always on call at the hospitals 24 hours/day If you have a medical emergency, go to the nearest emergency room or call 911.   Discharge wound care:   Complete by: As directed    It is good for closed incisions and even open wounds to be washed every day.  Shower every day.  Short baths are fine.  Wash the incisions and wounds clean with soap & water.    You may leave closed incisions open to air if it is dry.   You may cover the incision with clean gauze & replace it after your daily shower for comfort.  TEGADERM:  You have clear gauze band-aid dressings over your closed incision(s).  Remove the dressings 3 days after surgery = Saturday 2/3   Driving Restrictions   Complete by: As directed    You may drive when: - you are no longer taking narcotic prescription  pain medication - you can comfortably wear a seatbelt - you can safely make sudden turns/stops without pain.   Increase activity slowly   Complete by: As directed    Start light daily activities --- self-care, walking, climbing stairs- beginning the day after surgery.  Gradually increase activities as tolerated.  Control your pain to be active.  Stop when you are tired.  Ideally, walk several times a day, eventually an hour a day.   Most people are back to most day-to-day activities in a few weeks.  It takes 4-6 weeks to get back to unrestricted, intense activity. If you can walk 30 minutes without difficulty, it is safe to try more intense activity such as jogging, treadmill, bicycling, low-impact aerobics, swimming, etc. Save the most intensive and strenuous activity for last (Usually 4-8 weeks after surgery) such as sit-ups, heavy lifting, contact sports, etc.  Refrain from any intense heavy lifting or straining until you are off narcotics for pain control.  You will have  off days, but things should improve week-by-week. DO NOT PUSH THROUGH PAIN.  Let pain be your guide: If it hurts to do something, don't do it.   Lifting restrictions   Complete by: As directed    If you can walk 30 minutes without difficulty, it is safe to try more intense activity such as jogging, treadmill, bicycling, low-impact aerobics, swimming, etc. Save the most intensive and strenuous activity for last (Usually 4-8 weeks after surgery) such as sit-ups, heavy lifting, contact sports, etc.   Refrain from any intense heavy lifting or straining until you are off narcotics for pain control.  You will have off days, but things should improve week-by-week. DO NOT PUSH THROUGH PAIN.  Let pain be your guide: If it hurts to do something, don't do it.  Pain is your body warning you to avoid that activity for another week until the pain goes down.   May shower / Bathe   Complete by: As directed    May walk up steps   Complete by: As directed    Remove dressing in 72 hours   Complete by: As directed    Make sure all dressings are removed by the third day after surgery.  Leave incisions open to air.  OK to cover incisions with gauze or bandages as desired   Sexual Activity Restrictions   Complete by: As directed    You may have sexual intercourse when it is comfortable. If it hurts to do something, stop.       Allergies as of 12/21/2022       Reactions   Nsaids Other (See Comments)   Hx CKD = NO NSAIDs   Metformin Hcl Er Other (See Comments)        Medication List     TAKE these medications    amLODipine 10 MG tablet Commonly known as: NORVASC Take 1 tablet (10 mg total) by mouth daily.   carvedilol 25 MG tablet Commonly known as: COREG Take 25 mg by mouth 2 (two) times daily.   hydrALAZINE 50 MG tablet Commonly known as: APRESOLINE Take 50 mg by mouth 3 (three) times daily.   NovoLOG Mix 70/30 FlexPen (70-30) 100 UNIT/ML FlexPen Generic drug: insulin aspart protamine -  aspart Inject 10-15 Units into the skin See admin instructions. Inject 15 units into the skin in the morning before breakfast, 10 units before lunch and 15 units before dinner   sodium bicarbonate 650 MG tablet Take 650 mg by mouth 2 (  two) times daily.   tamoxifen 20 MG tablet Commonly known as: NOLVADEX Take 1 tablet (20 mg total) by mouth daily.   traMADol 50 MG tablet Commonly known as: ULTRAM Take 1-2 tablets (50-100 mg total) by mouth every 6 (six) hours as needed for moderate pain.               Discharge Care Instructions  (From admission, onward)           Start     Ordered   12/19/22 0000  Discharge wound care:       Comments: It is good for closed incisions and even open wounds to be washed every day.  Shower every day.  Short baths are fine.  Wash the incisions and wounds clean with soap & water.    You may leave closed incisions open to air if it is dry.   You may cover the incision with clean gauze & replace it after your daily shower for comfort.  TEGADERM:  You have clear gauze band-aid dressings over your closed incision(s).  Remove the dressings 3 days after surgery = Saturday 2/3   12/19/22 0738            Significant Diagnostic Studies:  SURGICAL PATHOLOGY CASE: [id number] PATIENT: Abigail Yoder Surgical Pathology Report     Clinical History: Colon cancer (crm)     FINAL MICROSCOPIC DIAGNOSIS:  A. COLON, PROXIMAL, RESECTION: Invasive colonic adenocarcinoma, 4 cm Carcinoma extends into pericolonic connective tissue All surgical margins negative for carcinoma Twenty lymph nodes negative for metastatic carcinoma (0/20) See oncology table  ONCOLOGY TABLE:   COLON AND RECTUM, CARCINOMA:  Resection Procedure: Proximal colectomy Tumor Site: Proximal transverse colon Tumor Size: 4 x 2.5 cm Macroscopic Tumor Perforation: Not identified Histologic Type: Colorectal adenocarcinoma, not otherwise specified Histologic Grade: Moderately  differentiated Multiple Primary Sites: Not applicable Tumor Extension: Into pericolonic connective tissue Lymphovascular Invasion: Not identified Perineural Invasion: Not identified Treatment Effect: No known presurgical therapy Margins:      Margin Status for Invasive Carcinoma: All margins negative for carcinoma Regional Lymph Nodes:      Number of Lymph Nodes with Tumor: 0      Number of Lymph Nodes Examined: 20 Tumor Deposits: Not identified Distant Metastasis:      Distant Site(s) Involved: Not applicable Pathologic Stage Classification (pTNM, AJCC 8th Edition): pT3, pN0 Ancillary Studies: MMR and MSI testing are pending Representative Tumor Block: A4-A7 (v4.2.0.1)  Sender Rueb DESCRIPTION: Specimen: Proximal colon, received fresh Specimen integrity: Intact Specimen length: The specimen consists of 11 cm of terminal ileum and 46 cm of colon. Tumor location: Proximal transverse colon Tumor size: The tumor consists of a 4 x 2.5 cm ulcerated, indurated tan-red sessile mass measuring 1 cm in maximum thickness. Percent of bowel circumference involved: 75% Tumor distance to margins:                      Proximal: 31 cm                      Distal: 18 cm                      Mesenteric (sigmoid and transverse): 6 cm Macroscopic extent of tumor invasion: The tumor involves the full-thickness of the wall and focally extends into the adjacent soft tissue. Total presumed lymph nodes: There are 21 rubbery ovoid nodules grossly consistent with lymph nodes measuring 0.3 to 0.7 cm. Extramural  satellite tumor nodules: Not grossly identified. Mucosal polyp(s): Not grossly identified. Additional findings: The uninvolved mucosa is glistening and tan.  A 0.9 x 0.7 cm stump of a appendix is present. Block summary: 11 blocks submitted 1 = proximal margin 2 = distal margin 3 = tumor with inked serosa 4-7 = tumor 8 = appendix 9-11 = lymph nodes (GRP 12/19/2022)  Final Diagnosis performed by  Claudette Laws, MD.   Electronically signed 12/20/2022 Technical component performed at [facility] [address], [place], [place] [address].  Professional component performed at [employer]. [facility], [place] [address], [place], [place] [address].  Immunohistochemistry Technical component (if applicable) was performed at [facility]. [address] [place], [place], [place] [address].   IMMUNOHISTOCHEMISTRY DISCLAIMER (if applicable): Some of these immunohistochemical stains may have been developed and the performance characteristics determine by [facility]. Some may not have been cleared or approved by the U.S. Food and Drug Administration. The FDA has determined that such clearance or approval is not necessary. This test is used for clinical purposes. It should not be regarded as investigational or for research. This laboratory is certified under the [place] (CLIA-88) as qualified to perform high complexity clinical laboratory testing.  The controls stained appropriately.   Results for orders placed or performed during the hospital encounter of 12/19/22 (from the past 72 hour(s))  Glucose, capillary     Status: Abnormal   Collection Time: 12/19/22  6:26 AM  Result Value Ref Range   Glucose-Capillary 127 (H) 70 - 99 mg/dL    Comment: Glucose reference range applies only to samples taken after fasting for at least 8 hours.  Surgical pathology     Status: None   Collection Time: 12/19/22  9:11 AM  Result Value Ref Range   SURGICAL PATHOLOGY      SURGICAL PATHOLOGY CASE: [id number] PATIENT: Abigail Yoder Surgical Pathology Report     Clinical History: Colon cancer (crm)     FINAL MICROSCOPIC DIAGNOSIS:  A. COLON, PROXIMAL, RESECTION: Invasive colonic adenocarcinoma, 4 cm Carcinoma extends into pericolonic connective tissue All surgical margins negative for  carcinoma Twenty lymph nodes negative for metastatic carcinoma (0/20) See oncology table  ONCOLOGY TABLE:   COLON AND RECTUM, CARCINOMA:  Resection Procedure: Proximal colectomy Tumor Site: Proximal transverse colon Tumor Size: 4 x 2.5 cm Macroscopic Tumor Perforation: Not identified Histologic Type: Colorectal adenocarcinoma, not otherwise specified Histologic Grade: Moderately differentiated Multiple Primary Sites: Not applicable Tumor Extension: Into pericolonic connective tissue Lymphovascular Invasion: Not identified Perineural Invasion: Not identified Treatment Effect: No known presurgical therapy Margins:      Margin Status for In vasive Carcinoma: All margins negative for carcinoma Regional Lymph Nodes:      Number of Lymph Nodes with Tumor: 0      Number of Lymph Nodes Examined: 20 Tumor Deposits: Not identified Distant Metastasis:      Distant Site(s) Involved: Not applicable Pathologic Stage Classification (pTNM, AJCC 8th Edition): pT3, pN0 Ancillary Studies: MMR and MSI testing are pending Representative Tumor Block: A4-A7 (v4.2.0.1)  Jolyn Deshmukh DESCRIPTION: Specimen: Proximal colon, received fresh Specimen integrity: Intact Specimen length: The specimen consists of 11 cm of terminal ileum and 46 cm of colon. Tumor location: Proximal transverse colon Tumor size: The tumor consists of a 4 x 2.5 cm ulcerated, indurated tan-red sessile mass measuring 1 cm in maximum thickness. Percent of bowel circumference involved: 75% Tumor distance to margins:  Proximal: 31 cm                      Distal: 18 cm                      Mesenteric (sigmoid and transverse): 6 cm Macroscop ic extent of tumor invasion: The tumor involves the full-thickness of the wall and focally extends into the adjacent soft tissue. Total presumed lymph nodes: There are 21 rubbery ovoid nodules grossly consistent with lymph nodes measuring 0.3 to 0.7 cm. Extramural satellite  tumor nodules: Not grossly identified. Mucosal polyp(s): Not grossly identified. Additional findings: The uninvolved mucosa is glistening and tan.  A 0.9 x 0.7 cm stump of a appendix is present. Block summary: 11 blocks submitted 1 = proximal margin 2 = distal margin 3 = tumor with inked serosa 4-7 = tumor 8 = appendix 9-11 = lymph nodes (GRP 12/19/2022)  Final Diagnosis performed by Claudette Laws, MD.   Electronically signed 12/20/2022 Technical component performed at [facility] [address] [place], [place] [address].  Professional component performed at [employer]. [facility], [place] [address], [place], [place] [address].  Immunohistochemistry Technical component  (if applicable) was performed at [facility]. [address] [place], [place], [place] [address].   IMMUNOHISTOCHEMISTRY DISCLAIMER (if applicable): Some of these immunohistochemical stains may have been developed and the performance characteristics determine by [facility]. Some may not have been cleared or approved by the U.S. Food and Drug Administration. The FDA has determined that such clearance or approval is not necessary. This test is used for clinical purposes. It should not be regarded as investigational or for research. This laboratory is certified under the [place] (CLIA-88) as qualified to perform high complexity clinical laboratory testing.  The controls stained appropriately.   Glucose, capillary     Status: Abnormal   Collection Time: 12/19/22 11:05 AM  Result Value Ref Range   Glucose-Capillary 235 (H) 70 - 99 mg/dL    Comment: Glucose reference range applies only to samples taken after fasting for at least 8 hours.  Glucose, capillary     Status: Abnormal   Collection Time: 12/19/22 12:18 PM  Result Value Ref Range   Glucose-Capillary 212 (H) 70 - 99 mg/dL    Comment: Glucose reference range  applies only to samples taken after fasting for at least 8 hours.  Glucose, capillary     Status: Abnormal   Collection Time: 12/19/22  4:47 PM  Result Value Ref Range   Glucose-Capillary 195 (H) 70 - 99 mg/dL    Comment: Glucose reference range applies only to samples taken after fasting for at least 8 hours.  Glucose, capillary     Status: Abnormal   Collection Time: 12/19/22  9:55 PM  Result Value Ref Range   Glucose-Capillary 330 (H) 70 - 99 mg/dL    Comment: Glucose reference range applies only to samples taken after fasting for at least 8 hours.  CBC     Status: Abnormal   Collection Time: 12/20/22  4:46 AM  Result Value Ref Range   WBC 7.3 4.0 - 10.5 K/uL   RBC 2.37 (L) 3.87 - 5.11 MIL/uL   Hemoglobin 7.0 (L) 12.0 - 15.0 g/dL   HCT 21.8 (L) 36.0 - 46.0 %   MCV 92.0 80.0 - 100.0 fL   MCH 29.5 26.0 - 34.0 pg   MCHC 32.1 30.0 -  36.0 g/dL   RDW 15.7 (H) 11.5 - 15.5 %   Platelets 128 (L) 150 - 400 K/uL   nRBC 0.0 0.0 - 0.2 %    Comment: Performed at [facility], [place] [address], [place], [place] [address]  Magnesium     Status: None   Collection Time: 12/20/22  4:46 AM  Result Value Ref Range   Magnesium 1.8 1.7 - 2.4 mg/dL    Comment: Performed at [facility], [place] [address], [place], [place] [address]  Basic metabolic panel     Status: Abnormal   Collection Time: 12/20/22  4:46 AM  Result Value Ref Range   Sodium 132 (L) 135 - 145 mmol/L   Potassium 5.1 3.5 - 5.1 mmol/L   Chloride 104 98 - 111 mmol/L   CO2 16 (L) 22 - 32 mmol/L   Glucose, Bld 279 (H) 70 - 99 mg/dL    Comment: Glucose reference range applies only to samples taken after fasting for at least 8 hours.   BUN 43 (H) 6 - 20 mg/dL   Creatinine, Ser 4.20 (H) 0.44 - 1.00 mg/dL   Calcium 8.0 (L) 8.9 - 10.3 mg/dL   GFR, Estimated 12 (L) >60 mL/min    Comment: (NOTE) Calculated using the CKD-EPI Creatinine Equation (2021)    Anion gap 12 5 - 15    Comment: Performed at  [facility], [place] [address], [place],  [address]  Glucose, capillary     Status: Abnormal   Collection Time: 12/20/22  7:36 AM  Result Value Ref Range   Glucose-Capillary 292 (H) 70 - 99 mg/dL    Comment: Glucose reference range applies only to samples taken after fasting for at least 8 hours.  Glucose, capillary     Status: Abnormal   Collection Time: 12/20/22 11:57 AM  Result Value Ref Range   Glucose-Capillary 237 (H) 70 - 99 mg/dL    Comment: Glucose reference range applies only to samples taken after fasting for at least 8 hours.  Glucose, capillary     Status: Abnormal   Collection Time: 12/20/22  4:41 PM  Result Value Ref Range   Glucose-Capillary 165 (H) 70 - 99 mg/dL    Comment: Glucose reference range applies only to samples taken after fasting for at least 8 hours.  Glucose, capillary     Status: Abnormal   Collection Time: 12/20/22  9:55 PM  Result Value Ref Range   Glucose-Capillary 220 (H) 70 - 99 mg/dL    Comment: Glucose reference range applies only to samples taken after fasting for at least 8 hours.  Basic metabolic panel     Status: Abnormal   Collection Time: 12/21/22  4:00 AM  Result Value Ref Range   Sodium 136 135 - 145 mmol/L   Potassium 4.5 3.5 - 5.1 mmol/L   Chloride 108 98 - 111 mmol/L   CO2 19 (L) 22 - 32 mmol/L   Glucose, Bld 206 (H) 70 - 99 mg/dL    Comment: Glucose reference range applies only to samples taken after fasting for at least 8 hours.   BUN 46 (H) 6 - 20 mg/dL   Creatinine, Ser 4.92 (H) 0.44 - 1.00 mg/dL   Calcium 8.2 (L) 8.9 - 10.3 mg/dL   GFR, Estimated 10 (L) >60 mL/min    Comment: (NOTE) Calculated using the CKD-EPI Creatinine Equation (2021)    Anion gap 9 5 - 15    Comment: Performed at [employer] [facility]  [place] [address], [place], [place] [address]  Hemoglobin     Status: Abnormal   Collection Time: 12/21/22  4:00 AM  Result Value Ref Range   Hemoglobin 7.1 (L) 12.0 - 15.0 g/dL     Comment: Performed at [facility], [place] [address], [place], [place] [address]    No results found.  Past Medical History:  Diagnosis Date   Anemia    Breast cancer ([place]) 2017   Cancer of hepatic flexure ([place])    Chronic kidney disease    Diabetes mellitus    Family history of breast cancer    History of left breast cancer    History of radiation therapy 06/10/17- 07/08/17   Left Breast 2.67 Gy in 15 fractions. Left Breast boost 2 Gy in 5 fractions   Hypertension    Obesity    Sleep apnea    NO CPAP    Past Surgical History:  Procedure Laterality Date   APPENDECTOMY     BREAST LUMPECTOMY WITH RADIOACTIVE SEED AND SENTINEL LYMPH NODE BIOPSY Left 12/31/2016   Procedure: BREAST LUMPECTOMY WITH RADIOACTIVE SEED AND SENTINEL LYMPH NODE BIOPSY;  Surgeon: Alphonsa Overall, MD;  Location: [facility];  Service: General;  Laterality: Left;   CERVICAL SPINE SURGERY     CESAREAN SECTION     IR REMOVAL TUN ACCESS W/ PORT W/O FL MOD SED  10/18/2017   KNEE SURGERY Left    PORTACATH PLACEMENT N/A 12/31/2016   Procedure: INSERTION PORT-A-CATH WITH [place];  Surgeon: Alphonsa Overall, MD;  Location: [facility];  Service: General;  Laterality: N/A;   TUBAL LIGATION      Social History   Socioeconomic History   Marital status: Legally Separated    Spouse name: Not on file   Number of children: 2   Years of education: Not on file   Highest education level: Not on file  Occupational History   Not on file  Tobacco Use   Smoking status: Former    Types: Cigarettes    Quit date: 11/19/1994    Years since quitting: 28.1   Smokeless tobacco: Never   Tobacco comments:    Quit over 24 years ago  Vaping Use   Vaping Use: Never used  Substance and Sexual Activity   Alcohol use: No   Drug use: No   Sexual activity: Yes    Birth control/protection: Surgical  Other Topics Concern   Not on file  Social History Narrative   Not on file   Social Determinants  of Health   Financial Resource Strain: Not on file  Food Insecurity: No Food Insecurity (12/19/2022)   Hunger Vital Sign    Worried About Running Out of Food in the Last Year: Never true    Ran Out of Food in the Last Year: Never true  Transportation Needs: No Transportation Needs (12/19/2022)   PRAPARE - Hydrologist (Medical): No    Lack of Transportation (Non-Medical): No  Physical Activity: Not on file  Stress: Not on file  Social Connections: Not on file  Intimate Partner Violence: Not At Risk (12/19/2022)   Humiliation, Afraid, Rape, and Kick questionnaire    Fear of Current or Ex-Partner: No    Emotionally Abused: No    Physically Abused: No    Sexually Abused: No    Family History  Problem Relation Age of Onset   Diabetes Mother    Arthritis Mother  Hypertension Father    Heart disease Father    Hyperlipidemia Father    Breast cancer Sister 29       came back 13 years later   Stroke Maternal Uncle    Stroke Maternal Grandmother     Current Facility-Administered Medications  Medication Dose Route Frequency Provider Last Rate Last Admin   0.9 %  sodium chloride infusion  250 mL Intravenous PRN Michael Boston, MD       acetaminophen (TYLENOL) tablet 1,000 mg  1,000 mg Oral Lajuana Ripple, MD   1,000 mg at 12/20/22 1706   alum & mag hydroxide-simeth (MAALOX/MYLANTA) 200-200-20 MG/5ML suspension 30 mL  30 mL Oral Q6H PRN Michael Boston, MD       alvimopan (ENTEREG) capsule 12 mg  12 mg Oral BID Michael Boston, MD   12 mg at 12/20/22 0845   amLODipine (NORVASC) tablet 10 mg  10 mg Oral Daily Michael Boston, MD   10 mg at 12/20/22 0845   carvedilol (COREG) tablet 25 mg  25 mg Oral BID Michael Boston, MD   25 mg at 12/20/22 2145   diphenhydrAMINE (BENADRYL) 12.5 MG/5ML elixir 12.5 mg  12.5 mg Oral Q6H PRN Michael Boston, MD       Or   diphenhydrAMINE (BENADRYL) injection 12.5 mg  12.5 mg Intravenous Q6H PRN Michael Boston, MD       feeding  supplement (ENSURE SURGERY) liquid 237 mL  237 mL Oral BID BM Michael Boston, MD   237 mL at 12/19/22 1655   ferrous sulfate tablet 325 mg  325 mg Oral BID WC Michael Boston, MD       [START ON 12/22/2022] heparin injection 5,000 Units  5,000 Units Subcutaneous Tor Netters, MD       hydrALAZINE (APRESOLINE) injection 10 mg  10 mg Intravenous Q2H PRN Michael Boston, MD       hydrALAZINE (APRESOLINE) tablet 50 mg  50 mg Oral TID Michael Boston, MD   50 mg at 12/20/22 2145   HYDROmorphone (DILAUDID) injection 0.5-2 mg  0.5-2 mg Intravenous Q4H PRN Michael Boston, MD       insulin aspart (novoLOG) injection 0-15 Units  0-15 Units Subcutaneous TID WC Michael Boston, MD   3 Units at 12/20/22 1706   insulin aspart (novoLOG) injection 0-5 Units  0-5 Units Subcutaneous Ardeen Fillers, MD   2 Units at 12/20/22 2211   insulin aspart protamine- aspart (NOVOLOG MIX 70/30) injection 15 Units  15 Units Subcutaneous BID WC Minda Ditto, RPH   15 Units at 12/20/22 1707   And   insulin aspart protamine- aspart (NOVOLOG MIX 70/30) injection 10 Units  10 Units Subcutaneous Q lunch Minda Ditto, RPH   10 Units at 12/20/22 1203   lactated ringers bolus 1,000 mL  1,000 mL Intravenous Q8H PRN Michael Boston, MD       lip balm (CARMEX) ointment   Topical BID Michael Boston, MD   Given at 12/20/22 2146   magic mouthwash  15 mL Oral QID PRN Michael Boston, MD       melatonin tablet 3 mg  3 mg Oral QHS PRN Michael Boston, MD       menthol-cetylpyridinium (CEPACOL) lozenge 3 mg  1 lozenge Oral PRN Michael Boston, MD       methocarbamol (ROBAXIN) 1,000 mg in dextrose 5 % 100 mL IVPB  1,000 mg Intravenous Q6H PRN Michael Boston, MD       methocarbamol (ROBAXIN) tablet 1,000  mg  1,000 mg Oral Q6H PRN Michael Boston, MD       metoprolol tartrate (LOPRESSOR) injection 5 mg  5 mg Intravenous Q6H PRN Michael Boston, MD       ondansetron [facility]) tablet 4 mg  4 mg Oral Q6H PRN Michael Boston, MD       Or   ondansetron [facility])  injection 4 mg  4 mg Intravenous Q6H PRN Michael Boston, MD       phenol (CHLORASEPTIC) mouth spray 2 spray  2 spray Mouth/Throat PRN Michael Boston, MD       polycarbophil (FIBERCON) tablet 625 mg  625 mg Oral BID Michael Boston, MD   625 mg at 12/20/22 2145   prochlorperazine (COMPAZINE) tablet 10 mg  10 mg Oral Q6H PRN Michael Boston, MD       Or   prochlorperazine (COMPAZINE) injection 5-10 mg  5-10 mg Intravenous Q6H PRN Michael Boston, MD       simethicone (MYLICON) chewable tablet 40 mg  40 mg Oral Q6H PRN Michael Boston, MD       sodium bicarbonate tablet 650 mg  650 mg Oral BID Michael Boston, MD   650 mg at 12/20/22 2145   sodium chloride flush (NS) 0.9 % injection 3 mL  3 mL Intravenous Gorden Harms, MD   3 mL at 12/20/22 2146   sodium chloride flush (NS) 0.9 % injection 3 mL  3 mL Intravenous PRN Michael Boston, MD       tamoxifen (NOLVADEX) tablet 20 mg  20 mg Oral Daily Michael Boston, MD   20 mg at 12/20/22 0846   traMADol (ULTRAM) tablet 50-100 mg  50-100 mg Oral Q6H PRN Michael Boston, MD         Allergies  Allergen Reactions   Nsaids Other (See Comments)    Hx CKD = NO NSAIDs   Metformin Hcl Er Other (See Comments)    Signed:   Adin Hector, MD, FACS, MASCRS Esophageal, Gastrointestinal & Colorectal Surgery Robotic and Minimally Invasive Surgery  [facility] [place] [address] [place], [place] [address] [phone] Fax [phone] Main  CONTACT INFORMATION:  Weekday (9AM-5PM): Call [facility] main office at [phone]  Weeknight (5PM-9AM) or Weekend/Holiday: Check [email or web address] (password " [email or web address]") for General Surgery [facility] coverage  (Please, do not use SecureChat as it is not reliable communication to reach operating surgeons for immediate patient care given surgeries/outpatient duties/clinic/cross-coverage/off post-call which would lead to a delay in care.  Epic staff messaging available for outptient  concerns, but may not be answered for 48 hours or more).     12/21/2022, 7:43 AM

## 2022-12-21 NOTE — TOC CM/SW Note (Signed)
Transition of Care [facility]) Screening Note  Patient Details  Name: Abigail Yoder Date of Birth: March 16, 1966  Transition of Care [facility]) CM/SW Contact:    Sherie Don, LCSW Phone Number: 12/21/2022, 8:58 AM  Transition of Care Department [facility]) has reviewed patient and no TOC needs have been identified at this time. We will continue to monitor patient advancement through interdisciplinary progression rounds. If new patient transition needs arise, please place a TOC consult.

## 2022-12-21 NOTE — Progress Notes (Signed)
Patient requested to have IV removed because of discomfort, IV removed. Patient doesn't want a new IV.

## 2022-12-21 NOTE — Progress Notes (Unsigned)
We received an in basket message from Dr Johney Maine for consult with medical oncology.  Patient prefers Abigail Yoder.  She is scheduled to see Cira Rue and Dr Burr Medico on 2/21 at 1230.  She knows to arrive by 1215 for registration.  She is aware of our location.  Tennis Ship

## 2022-12-22 LAB — BASIC METABOLIC PANEL
Anion gap: 7 (ref 5–15)
BUN: 47 mg/dL — ABNORMAL HIGH (ref 6–20)
CO2: 19 mmol/L — ABNORMAL LOW (ref 22–32)
Calcium: 7.8 mg/dL — ABNORMAL LOW (ref 8.9–10.3)
Chloride: 109 mmol/L (ref 98–111)
Creatinine, Ser: 4.42 mg/dL — ABNORMAL HIGH (ref 0.44–1.00)
GFR, Estimated: 11 mL/min — ABNORMAL LOW (ref >60)
Glucose, Bld: 100 mg/dL — ABNORMAL HIGH (ref 70–99)
Potassium: 4.6 mmol/L (ref 3.5–5.1)
Sodium: 135 mmol/L (ref 135–145)

## 2022-12-22 LAB — HEMOGLOBIN: Hemoglobin: 6.7 g/dL — CL (ref 12.0–15.0)

## 2022-12-22 LAB — GLUCOSE, CAPILLARY
Glucose-Capillary: 150 mg/dL — ABNORMAL HIGH (ref 70–99)
Glucose-Capillary: 103 mg/dL — ABNORMAL HIGH (ref 70–99)
Glucose-Capillary: 77 mg/dL (ref 70–99)
Glucose-Capillary: 128 mg/dL — ABNORMAL HIGH (ref 70–99)

## 2022-12-22 LAB — PREPARE RBC (CROSSMATCH)

## 2022-12-22 MED ORDER — SODIUM CHLORIDE 0.9% IV SOLUTION
Freq: Once | INTRAVENOUS | Status: AC
  Administered 2022-12-22: 250 mL via INTRAVENOUS

## 2022-12-22 MED ORDER — HEPARIN SODIUM (PORCINE) 5000 UNIT/ML IJ SOLN
5000.0000 [IU] | Freq: Three times a day (TID) | INTRAMUSCULAR | Status: DC
  Administered 2022-12-23: 5000 [IU] via SUBCUTANEOUS
  Filled 2022-12-22: qty 1

## 2022-12-22 NOTE — Progress Notes (Signed)
MD Ninfa Linden was notified that patient's hemoglobin is 6.7, no further orders were given.

## 2022-12-22 NOTE — Progress Notes (Addendum)
3 Days Post-Op   Subjective/Chief Complaint: No complaints Ambulating well No blood in bm   Objective: Vital signs in last 24 hours: Temp:  [98.1 F (36.7 C)-98.7 F (37.1 C)] 98.1 F (36.7 C) (02/03 0516) Pulse Rate:  [80-89] 87 (02/03 0516) Resp:  [14-18] 18 (02/03 0516) BP: (132-152)/(61-73) 152/73 (02/03 0516) SpO2:  [97 %-100 %] 97 % (02/03 0516) Weight:  [105.9 kg] 105.9 kg (02/03 0500) Last BM Date : 12/21/22  Intake/Output from previous day: 02/02 0701 - 02/03 0700 In: 1080 [P.O.:1080] Out: 0  Intake/Output this shift: No intake/output data recorded.  Exam: Awake and alert Abdomen soft, minimally tender, incisions clean  Lab Results:  Recent Labs    12/20/22 0446 12/21/22 0400 12/22/22 0443  WBC 7.3  --   --   HGB 7.0* 7.1* 6.7*  HCT 21.8*  --   --   PLT 128*  --   --    BMET Recent Labs    12/21/22 0400 12/22/22 0443  NA 136 135  K 4.5 4.6  CL 108 109  CO2 19* 19*  GLUCOSE 206* 100*  BUN 46* 47*  CREATININE 4.92* 4.42*  CALCIUM 8.2* 7.8*   PT/INR No results for input(s): "LABPROT", "INR" in the last 72 hours. ABG No results for input(s): "PHART", "HCO3" in the last 72 hours.  Invalid input(s): "PCO2", "PO2"  Studies/Results: No results found.  Anti-infectives: Anti-infectives (From admission, onward)    Start     Dose/Rate Route Frequency Ordered Stop   12/19/22 2000  cefoTEtan (CEFOTAN) 2 g in sodium chloride 0.9 % 100 mL IVPB        2 g 200 mL/hr over 30 Minutes Intravenous Every 12 hours 12/19/22 1507 12/19/22 2038   12/19/22 1400  neomycin (MYCIFRADIN) tablet 1,000 mg  Status:  Discontinued       See Hyperspace for full Linked Orders Report.   1,000 mg Oral 3 times per day 12/19/22 0614 12/19/22 0620   12/19/22 1400  metroNIDAZOLE (FLAGYL) tablet 1,000 mg  Status:  Discontinued       See Hyperspace for full Linked Orders Report.   1,000 mg Oral 3 times per day 12/19/22 0614 12/19/22 0620   12/19/22 0615  cefoTEtan (CEFOTAN)  2 g in sodium chloride 0.9 % 100 mL IVPB        2 g 200 mL/hr over 30 Minutes Intravenous On call to O.R. 12/19/22 6553 12/19/22 0826       Assessment/Plan: s/p Procedure(s): ROBOTIC COLECTOMY PROXIMAL WITH BILATERAL TAP BLOCK AND ASSESSMENT OF PERFUSION USING FIREFLY (N/A)  Hgb down to 6.7 today.  BP and HR seem stable but given chronic kidney disease, recent anastomosis, will transfuse 1 unit of PRBC's today and repeat CBC in the morning.  She understands and agrees with the plans  LOS: 3 days    Coralie Keens MD 12/22/2022

## 2022-12-22 NOTE — Progress Notes (Signed)
Heparin was not given due to hemoglobin being 6.7

## 2022-12-23 LAB — BPAM RBC
Blood Product Expiration Date: 202402192359
ISSUE DATE / TIME: 202402031058
Unit Type and Rh: 5100

## 2022-12-23 LAB — CBC
HCT: 24.7 % — ABNORMAL LOW (ref 36.0–46.0)
Hemoglobin: 7.8 g/dL — ABNORMAL LOW (ref 12.0–15.0)
MCH: 29.5 pg (ref 26.0–34.0)
MCHC: 31.6 g/dL (ref 30.0–36.0)
MCV: 93.6 fL (ref 80.0–100.0)
Platelets: 134 10*3/uL — ABNORMAL LOW (ref 150–400)
RBC: 2.64 MIL/uL — ABNORMAL LOW (ref 3.87–5.11)
RDW: 17 % — ABNORMAL HIGH (ref 11.5–15.5)
WBC: 9 10*3/uL (ref 4.0–10.5)
nRBC: 0.2 % (ref 0.0–0.2)

## 2022-12-23 LAB — TYPE AND SCREEN
ABO/RH(D): O POS
Antibody Screen: NEGATIVE
Unit division: 0

## 2022-12-23 LAB — GLUCOSE, CAPILLARY
Glucose-Capillary: 159 mg/dL — ABNORMAL HIGH (ref 70–99)
Glucose-Capillary: 153 mg/dL — ABNORMAL HIGH (ref 70–99)

## 2022-12-23 NOTE — Progress Notes (Signed)
Patient ID: Abigail Yoder, female   DOB: [birth year], 57 y.o.   MRN: [id number]   Doing well Wants to go home Hgb up to 7.8 after transfusion Abdomen soft VSS  Plan:  Discharge home

## 2022-12-26 LAB — SURGICAL PATHOLOGY

## 2022-12-27 ENCOUNTER — Encounter: Payer: Self-pay | Admitting: Adult Health

## 2023-01-09 ENCOUNTER — Inpatient Hospital Stay: Payer: 59 | Attending: Nurse Practitioner | Admitting: Nurse Practitioner

## 2023-01-09 ENCOUNTER — Encounter: Payer: Self-pay | Admitting: Nurse Practitioner

## 2023-01-09 ENCOUNTER — Other Ambulatory Visit: Payer: Self-pay

## 2023-01-09 ENCOUNTER — Inpatient Hospital Stay: Payer: 59

## 2023-01-09 VITALS — BP 137/66 | HR 80 | Temp 97.7°F | Resp 18 | Ht 63.0 in | Wt 217.4 lb

## 2023-01-09 DIAGNOSIS — E1122 Type 2 diabetes mellitus with diabetic chronic kidney disease: Secondary | ICD-10-CM | POA: Diagnosis not present

## 2023-01-09 DIAGNOSIS — Z923 Personal history of irradiation: Secondary | ICD-10-CM | POA: Diagnosis not present

## 2023-01-09 DIAGNOSIS — Z170 Estrogen receptor positive status [ER+]: Secondary | ICD-10-CM | POA: Insufficient documentation

## 2023-01-09 DIAGNOSIS — N189 Chronic kidney disease, unspecified: Secondary | ICD-10-CM | POA: Insufficient documentation

## 2023-01-09 DIAGNOSIS — Z79810 Long term (current) use of selective estrogen receptor modulators (SERMs): Secondary | ICD-10-CM | POA: Diagnosis not present

## 2023-01-09 DIAGNOSIS — D631 Anemia in chronic kidney disease: Secondary | ICD-10-CM | POA: Diagnosis not present

## 2023-01-09 DIAGNOSIS — Z803 Family history of malignant neoplasm of breast: Secondary | ICD-10-CM | POA: Insufficient documentation

## 2023-01-09 DIAGNOSIS — C50212 Malignant neoplasm of upper-inner quadrant of left female breast: Secondary | ICD-10-CM | POA: Diagnosis not present

## 2023-01-09 DIAGNOSIS — Z87891 Personal history of nicotine dependence: Secondary | ICD-10-CM | POA: Diagnosis not present

## 2023-01-09 DIAGNOSIS — C183 Malignant neoplasm of hepatic flexure: Secondary | ICD-10-CM | POA: Diagnosis not present

## 2023-01-09 DIAGNOSIS — I129 Hypertensive chronic kidney disease with stage 1 through stage 4 chronic kidney disease, or unspecified chronic kidney disease: Secondary | ICD-10-CM | POA: Diagnosis not present

## 2023-01-09 LAB — CBC WITH DIFFERENTIAL (CANCER CENTER ONLY)
Abs Immature Granulocytes: 0.02 10*3/uL (ref 0.00–0.07)
Basophils Absolute: 0 10*3/uL (ref 0.0–0.1)
Basophils Relative: 0 %
Eosinophils Absolute: 0.2 10*3/uL (ref 0.0–0.5)
Eosinophils Relative: 3 %
HCT: 28.3 % — ABNORMAL LOW (ref 36.0–46.0)
Hemoglobin: 9.3 g/dL — ABNORMAL LOW (ref 12.0–15.0)
Immature Granulocytes: 0 %
Lymphocytes Relative: 19 %
Lymphs Abs: 1.4 10*3/uL (ref 0.7–4.0)
MCH: 30.2 pg (ref 26.0–34.0)
MCHC: 32.9 g/dL (ref 30.0–36.0)
MCV: 91.9 fL (ref 80.0–100.0)
Monocytes Absolute: 0.5 10*3/uL (ref 0.1–1.0)
Monocytes Relative: 6 %
Neutro Abs: 5.1 10*3/uL (ref 1.7–7.7)
Neutrophils Relative %: 72 %
Platelet Count: 122 10*3/uL — ABNORMAL LOW (ref 150–400)
RBC: 3.08 MIL/uL — ABNORMAL LOW (ref 3.87–5.11)
RDW: 15.8 % — ABNORMAL HIGH (ref 11.5–15.5)
WBC Count: 7.1 10*3/uL (ref 4.0–10.5)
nRBC: 0 % (ref 0.0–0.2)

## 2023-01-09 LAB — CMP (CANCER CENTER ONLY)
ALT: 12 U/L (ref 0–44)
AST: 17 U/L (ref 15–41)
Albumin: 3.3 g/dL — ABNORMAL LOW (ref 3.5–5.0)
Alkaline Phosphatase: 58 U/L (ref 38–126)
Anion gap: 9 (ref 5–15)
BUN: 45 mg/dL — ABNORMAL HIGH (ref 6–20)
CO2: 23 mmol/L (ref 22–32)
Calcium: 8.8 mg/dL — ABNORMAL LOW (ref 8.9–10.3)
Chloride: 106 mmol/L (ref 98–111)
Creatinine: 3.65 mg/dL — ABNORMAL HIGH (ref 0.44–1.00)
GFR, Estimated: 14 mL/min — ABNORMAL LOW (ref >60)
Glucose, Bld: 150 mg/dL — ABNORMAL HIGH (ref 70–99)
Potassium: 4.5 mmol/L (ref 3.5–5.1)
Sodium: 138 mmol/L (ref 135–145)
Total Bilirubin: 0.3 mg/dL (ref 0.3–1.2)
Total Protein: 8 g/dL (ref 6.5–8.1)

## 2023-01-09 LAB — FERRITIN: Ferritin: 222 ng/mL (ref 11–307)

## 2023-01-09 LAB — IRON AND IRON BINDING CAPACITY (CC-WL,HP ONLY)
Iron: 51 ug/dL (ref 28–170)
Saturation Ratios: 20 % (ref 10.4–31.8)
TIBC: 258 ug/dL (ref 250–450)
UIBC: 207 ug/dL

## 2023-01-09 LAB — CEA (ACCESS): CEA (CHCC): 1.2 ng/mL (ref 0.00–5.00)

## 2023-01-09 NOTE — Progress Notes (Addendum)
[facility]   [phone] Fax:([phone]) [place] Note   Patient Care Team: Adria Dill Leonia Reader, [name] as PCP - General (Internal Medicine) Eppie Gibson, MD as Attending Physician (Radiation Oncology) Jacelyn Pi, MD as Consulting Physician (Endocrinology) Ardis Hughs, MD as Attending Physician (Urology) Delice Bison, Charlestine Massed, NP as Nurse Practitioner (Hematology and Oncology) Andee Lineman, NP as Nurse Practitioner (Nephrology) Michael Boston, MD as Consulting Physician (General Surgery) Otis Brace, MD as Consulting Physician (Gastroenterology) 01/09/2023  CHIEF COMPLAINTS/PURPOSE OF CONSULTATION:  Colon cancer, referred by colorectal surgeon Dr. Remo Lipps gross  Oncology History  Malignant neoplasm of upper-inner quadrant of left breast in female, estrogen receptor positive ([place])  11/27/2016 Initial Diagnosis   Malignant neoplasm of upper-inner quadrant of left breast in female, estrogen receptor positive ([place])   12/11/2016 Genetic Testing   Patient has genetic testing done for personal and family history of breast cancer. RAD51D c.919G>A VUS identified on the Breast/Gyn Panel.  Negative genetic testing for the MSH2 inversion analysis ([place] inversion). The Breast/GYN gene panel offered by [facility] includes sequencing and rearrangement analysis for the following 23 genes:  ATM, BARD1, BRCA1, BRCA2, BRIP1, CDH1, CHEK2, EPCAM, FANCC, MLH1, MSH2, MSH6, MUTYH, NBN, NF1, PALB2, PMS2, POLD1, PTEN, RAD51C, RAD51D, RECQL, and TP53.     UPDATE: RAD51D c.919G>A VUS has been reclassified to "Likely Benign." The report date is 03/04/2019.    12/31/2016 Surgery   Left breast lumpectomy Lucia Gaskins): IDC, 1.8cm, grade 3, margins negative, 1SLN negative, ER+(80%), PR-(2%), Ki-67 90%, HER-2 negative (ratio 1.05).    01/17/2017 - 05/16/2017 Adjuvant Chemotherapy   Doxorubicin and Cyclophosphamide x 4 cycles to be followed by weekly Abraxane    06/10/2017 - 07/08/2017 Radiation Therapy   Adjuvant radiation Isidore Moos): 1.) Left Breast, 2.67 Gy in 15 fractions.  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 Alessandra Bevels: -Perianal skin tags -Three 4 - 8 mm polyps in the cecum, ascending colon, and sigmoid colon -Likely malignant tumor in the transverse colon. Biopsied and tatooed -Internal hemorrhoids   10/04/2022 Initial Biopsy   Polyps: Tubulovillous adenoma. Tubular adenoma  2. LG intestine, transverse colon Invasive adenocarcinoma    10/10/2022 Imaging   CT CAP IMPRESSION: 1. No specific findings identified to suggest metastatic disease to the chest, abdomen or pelvis. 2. There is a focal area of circumferential luminal narrowing and wall thickening involving the hepatic flexure which is of uncertain clinical significance and correlation with colonoscopy findings is advised. 3. Gas is identified within the non dependent portion of the urinary bladder. Etiology indeterminate. Correlate for any history of recent instrumentation. If there is no history of recent instrumentation then findings are concerning for cystitis. 4.  Aortic Atherosclerosis (ICD10-I70.0).   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   12/19/2022 Cancer Staging   Staging form: Colon and Rectum, AJCC 8th Edition - Pathologic stage from 12/19/2022: Stage IIA (pT3, pN0, cM0) - Signed by Alla Feeling, NP on 01/09/2023 Stage prefix: Initial diagnosis Total positive nodes: 0 Histologic grading system: 4 grade system Histologic grade (G): G2 Lymph-vascular invasion (LVI): LVI not present (absent)/not identified   12/19/2022 Definitive Surgery   PROCEDURE:   -ROBOTIC COLECTOMY PROXIMAL -INTRAOPERATIVE ASSESSMENT OF TISSUE VASCULAR  PERFUSION USING ICG (indocyanine green) IMMUNOFLUORESCENCE -  TRANSVERSUS ABDOMINIS PLANE (TAP) BLOCK - BILATERAL SURGEON:  Adin Hector, MD ASSISTANT: Nadeen Landau, MD   12/19/2022 Pathology Results   FINAL MICROSCOPIC DIAGNOSIS:  A. COLON, PROXIMAL, RESECTION:  Invasive colonic adenocarcinoma, 4 cm  Carcinoma extends into pericolonic connective tissue  All surgical margins negative for carcinoma  Twenty lymph nodes negative for metastatic carcinoma (0/20)  See oncology table  pT3, pN0  MMR normal      HISTORY OF PRESENTING ILLNESS:  Abigail Yoder 57 y.o. female with past medical history including estrogen receptor positive left breast cancer (s/p lumpectomy, adjuvant chemo, radiation, and on tamoxifen), CKD stage IV, anemia, and DM is here because of newly diagnosed colon cancer.  She underwent colonoscopy 10/04/2022 by Dr. Alessandra Bevels after positive Cologuard (07/2022) which showed 3 anal skin tags, internal hemorrhoids, 3 sessile polyps, and an ulcerated/infiltrative and frond-like nonobstructing mass in the transverse colon which was partially circumferential measuring 4 cm in length.  Pathology of the polyps showed tubulovillous adenomas, the transverse colon mass positive for invasive adenocarcinoma.  Staging CT 10/10/2022 showed focal circumferential luminal narrowing and wall thickening at the hepatic flexure but no evidence to suggest metastatic disease.  She underwent robotic proximal colectomy on 12/19/2022 by Dr. Johney Maine, surgical path showed invasive moderately differentiated adenocarcinoma measuring 4 cm extending into pericolonic connective tissue, with negative margins and 0 of 20 positive lymph nodes (0/20).  There were no high risk features such as perforation, lymphovascular or perineural invasion. There was preserved nuclear expression of the major mismatch repair proteins.  Previous genetic testing with breast cancer diagnosis was negative for Lynch syndrome or other  genetic mutation.  This was staged PT3, pN0, stage IIa.  She developed acute on chronic anemia, Hgb 6.7 and received 1 dose of IV iron and 1 unit of blood on 12/22/2022 prior to discharge.  Socially, she is divorced, has 2 healthy children ages 58 and 73.  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  She is mostly sedentary and less at work.  She is independent with ADLs.  She denies alcohol and drug use, smoked cigarettes socially but quit 26 years ago.  Up-to-date on age-appropriate cancer screenings.  She has 5 sisters and 1 brother.  1 sister diagnosed with breast cancer age 22 followed by Dr. Lindi Adie, and another sister with kidney cancer.  Denies family history of colon, prostate, pancreatic, ovarian, or other cancers.  Today, she presents by herself.  She was completely asymptomatic prior to diagnosis, with no bowel changes, rectal bleeding, abdominal pain/bloating, or unintentional weight loss.  Since surgery, her appetite and energy are recovering.  Occasional abdominal pain with overeating so she eats smaller amounts.  Gets fatigued with ADLs but able to complete them.  Bowels moving.  She is always cold from anemia and has ice pica.  Denies fever, chills, cough, chest pain, dyspnea, leg edema.  Continues tamoxifen for breast cancer history, tolerating well without side effects.    MEDICAL HISTORY:  Past Medical History:  Diagnosis Date   Anemia    Breast cancer ([place]) 2017   Cancer of hepatic flexure ([place])    Chronic kidney disease    Diabetes mellitus    Family history of breast cancer    History of left breast cancer    History of radiation therapy 06/10/17- 07/08/17   Left Breast 2.67 Gy in 15 fractions. Left Breast boost 2 Gy in 5 fractions   Hypertension    Obesity  Sleep apnea    NO CPAP    SURGICAL HISTORY: Past Surgical History:  Procedure Laterality Date   APPENDECTOMY     BREAST LUMPECTOMY WITH RADIOACTIVE SEED AND SENTINEL LYMPH NODE BIOPSY Left  12/31/2016   Procedure: BREAST LUMPECTOMY WITH RADIOACTIVE SEED AND SENTINEL LYMPH NODE BIOPSY;  Surgeon: Alphonsa Overall, MD;  Location: [facility];  Service: General;  Laterality: Left;   CERVICAL SPINE SURGERY     CESAREAN SECTION     IR REMOVAL TUN ACCESS W/ PORT W/O FL MOD SED  10/18/2017   KNEE SURGERY Left    PORTACATH PLACEMENT N/A 12/31/2016   Procedure: INSERTION PORT-A-CATH WITH [place];  Surgeon: Alphonsa Overall, MD;  Location: [facility];  Service: General;  Laterality: N/A;   TUBAL LIGATION      SOCIAL HISTORY: Social History   Socioeconomic History   Marital status: Legally Separated    Spouse name: Not on file   Number of children: 2   Years of education: Not on file   Highest education level: Not on file  Occupational History   Not on file  Tobacco Use   Smoking status: Former    Types: Cigarettes    Quit date: 11/19/1994    Years since quitting: 28.1   Smokeless tobacco: Never   Tobacco comments:    Quit over 24 years ago  Vaping Use   Vaping Use: Never used  Substance and Sexual Activity   Alcohol use: No   Drug use: No   Sexual activity: Yes    Birth control/protection: Surgical  Other Topics Concern   Not on file  Social History Narrative   Not on file   Social Determinants of Health   Financial Resource Strain: Not on file  Food Insecurity: No Food Insecurity (12/19/2022)   Hunger Vital Sign    Worried About Running Out of Food in the Last Year: Never true    Ran Out of Food in the Last Year: Never true  Transportation Needs: No Transportation Needs (12/19/2022)   PRAPARE - Hydrologist (Medical): No    Lack of Transportation (Non-Medical): No  Physical Activity: Not on file  Stress: Not on file  Social Connections: Not on file  Intimate Partner Violence: Not At Risk (12/19/2022)   Humiliation, Afraid, Rape, and Kick questionnaire    Fear of Current or Ex-Partner: No    Emotionally Abused: No     Physically Abused: No    Sexually Abused: No    FAMILY HISTORY: Family History  Problem Relation Age of Onset   Diabetes Mother    Arthritis Mother    Hypertension Father    Heart disease Father    Hyperlipidemia Father    Breast cancer Sister 69       came back 59 years later   Stroke Maternal Uncle    Stroke Maternal Grandmother     ALLERGIES:  is allergic to nsaids and metformin hcl er.  MEDICATIONS:  Current Outpatient Medications  Medication Sig Dispense Refill   amLODipine (NORVASC) 10 MG tablet Take 1 tablet (10 mg total) by mouth daily.     carvedilol (COREG) 25 MG tablet Take 25 mg by mouth 2 (two) times daily.     hydrALAZINE (APRESOLINE) 50 MG tablet Take 50 mg by mouth 3 (three) times daily.     NOVOLOG MIX 70/30 FLEXPEN (70-30) 100 UNIT/ML FlexPen Inject 10-15 Units into the skin See admin instructions. Inject 15 units  into the skin in the morning before breakfast, 10 units before lunch and 15 units before dinner     sodium bicarbonate 650 MG tablet Take 650 mg by mouth 2 (two) times daily.     tamoxifen (NOLVADEX) 20 MG tablet Take 1 tablet (20 mg total) by mouth daily. 90 tablet 4   traMADol (ULTRAM) 50 MG tablet Take 1-2 tablets (50-100 mg total) by mouth every 6 (six) hours as needed for moderate pain. (Patient not taking: Reported on 01/09/2023) 20 tablet 0   No current facility-administered medications for this visit.    REVIEW OF SYSTEMS:   Constitutional: Denies fevers, chills, unintentional weight loss, abnormal night sweats (+) fatigue Eyes: Denies blurriness of vision, double vision or watery eyes Ears, nose, mouth, throat, and face: Denies mucositis or sore throat Respiratory: Denies cough, dyspnea or wheezes Cardiovascular: Denies palpitation, chest discomfort or lower extremity swelling Gastrointestinal:  Denies nausea, heartburn or change in bowel habits, abdominal pain/bloating, hematochezia Skin: Denies abnormal skin rashes Lymphatics: Denies new  lymphadenopathy or easy bruising Neurological:Denies numbness, tingling or new weaknesses Behavioral/Psych: Mood is stable, no new changes  All other systems were reviewed with the patient and are negative.  PHYSICAL EXAMINATION: ECOG PERFORMANCE STATUS: 0 - Asymptomatic  Vitals:   01/09/23 1242  BP: 137/66  Pulse: 80  Resp: 18  Temp: 97.7 F (36.5 C)  SpO2: 100%   Filed Weights   01/09/23 1242  Weight: 217 lb 6.4 oz (98.6 kg)    GENERAL:alert, no distress and comfortable SKIN: skin color, texture, turgor are normal, no rashes or significant lesions EYES: normal, conjunctiva are pink and non-injected, sclera clear NECK: supple, thyroid normal size, non-tender, without nodularity LYMPH:  no palpable cervical or supraclavicular lymphadenopathy  LUNGS: normal breathing effort HEART: no lower extremity edema ABDOMEN:abdomen soft, non-tender and normal bowel sounds. Incisions healed Musculoskeletal:no cyanosis of digits and no clubbing  PSYCH: alert & oriented x 3 with fluent speech NEURO: no focal motor/sensory deficits BREAST: deferred   LABORATORY DATA:  I have reviewed the data as listed    Latest Ref Rng & Units 01/09/2023    1:36 PM 12/23/2022    3:49 AM 12/22/2022    4:43 AM  CBC  WBC 4.0 - 10.5 K/uL 7.1  9.0    Hemoglobin 12.0 - 15.0 g/dL 9.3  7.8  6.7   Hematocrit 36.0 - 46.0 % 28.3  24.7    Platelets 150 - 400 K/uL 122  134        Latest Ref Rng & Units 12/22/2022    4:43 AM 12/21/2022    4:00 AM 12/20/2022    4:46 AM  CMP  Glucose 70 - 99 mg/dL 100  206  279   BUN 6 - 20 mg/dL 47  46  43   Creatinine 0.44 - 1.00 mg/dL 4.42  4.92  4.20   Sodium 135 - 145 mmol/L 135  136  132   Potassium 3.5 - 5.1 mmol/L 4.6  4.5  5.1   Chloride 98 - 111 mmol/L 109  108  104   CO2 22 - 32 mmol/L 19  19  16   $ Calcium 8.9 - 10.3 mg/dL 7.8  8.2  8.0       RADIOGRAPHIC STUDIES: I have personally reviewed the radiological images as listed and agreed with the findings in the  report. No results found.  ASSESSMENT & PLAN: 57 year old female  Malignant neoplasm of hepatic flexure, G2, pT3N0M0 stage IIA, MMR normal We  reviewed her medical record in detail with the patient.  She was asymptomatic with positive Cologuard, found to have adenocarcinoma of the hepatic flexure on first colonoscopy.  Staging workup negative for distant metastasis -She underwent proximal colectomy by Dr. Johney Maine on 12/19/2022, path showed moderately differentiated adenocarcinoma measuring 4 cm, extending into pericolonic tissue, with clear margins, and 0/20 positive lymph nodes. MMR normal. There were no high risk features.  -Previous genetic testing with breast cancer dx included lynch syndrome, which was negative  -We discussed her colon cancer was completely removed in surgery, any adjuvant therapy would be preventative but is not standard and stage II disease -We reviewed the low to moderate recurrence risk in stage II colon cancer, around 20-25%. -We discussed the role of guardant reveal to detect circulating tumor DNA, however even if detected, she would not be an ideal candidate for chemotherapy given her severe CKD. She did not tolerate adjuvant chemo for breast cancer. We opted to forego this testing -Ms. Pomplun appears well, recovering from surgery.  Denies pain.  Bowels moving.  She will proceed with surveillance -I discussed the surveillance plan, which is a physical exam and lab test (including CBC, CMP and CEA) every 3-4 months for the first 2 years, then every 6-12 months, colonoscopy in one year, and surveilliance CT scan annually for 1-2 years; up to 5 years surveillance.   -F/up in 6 months -Pt seen with Dr. Burr Medico  Malignant neoplasm of upper-inner left breast, pT1c,N0M0, G3, ER+ (80% weakly positive) PR- HER2 - Ki67 90%, oncotype 60 -S/p lumpectomy 12/31/2016, path showed invasive ductal carcinoma, 1.8 cm, grade 3 with negative margins, 1 SLN negative -Genetic testing showed VUS in  RAD51D that was reclassified as likely benign -Due to high risk oncotype 60, which predicts 34% recurrence risk in the future, she received adjuvant chemo ddAC x4. She had neutropenia, mucositis, and hospitalization with AC, and only received 4/12 cycles of weekly abraxane. Chemo discontinued early due to cytopenias.  -S/p adjuvant radiation 05/2017 - 06/2017 -Currently on Tamoxifen for a goal of 10 years, starting 07/2017. Tolerating well -Managed by Dr. Jana Hakim in the past -Last mammogram 11/2022 was benign -She will have f/up with Wilber Bihari, NP in April, pt wishes to keep that visit.  -We can take over breast and colon cancer surveillance after that  Anemia of chronic disease, CKD, DM, HTN -SHe has had mild anemia since at least 2008, Hgb 10-11 range, then worsened to 9-10 range in past few years -She had acute on chronic anemia during adjuvant chemotherapy for breast cancer, and again post op after colon cancer surgery.  -Has been on oral iron in the past but does not tolerate well due to constipation -She received 1 dose IV iron (ferrlecit 125 mg) on 2/1 and 1 u RBC on 12/22/22 -Scr range 3-4, eGFR ~11. She is followed by nephrology -Repeat labs today. We briefly discussed the role of ESA in anemia of chronic disease. Given that it is a growth factor, and her history of 2 malignancies, would like to hold off as long as possible.   4. Family history, age appropriate health maintenance -We reviewed her family's increased risk. Her children are 79 and 62, and she has 5 sisters and 1 brother. Her siblings have been screened and clear -She has discussed screening with her daughter age 37  -I encouraged her to engage in healthy active lifestyle, avoid smoking, limit alcohol, and continue age appropriate care    PLAN: -Medical record reviewed,  including breast cancer h/o, genetics, and recent colon cancer endoscopy, imaging, and path  -Proceed with colon cancer surveillance  -F/up with  Wilber Bihari, NP as scheduled 03/11/2023 for breast cancer surveillance, then we can take over. Continue Tamoxifen -Lab and colon cancer surveillance visit in 06/2023 -Pt seen with Dr. Burr Medico   Orders Placed This Encounter  Procedures   CBC with Differential ([facility] Only)    Standing Status:   Future    Number of Occurrences:   1    Standing Expiration Date:   01/10/2024   CEA (Access)-[facility] ONLY    Standing Status:   Future    Number of Occurrences:   1    Standing Expiration Date:   01/10/2024   CMP ([facility] only)    Standing Status:   Future    Number of Occurrences:   1    Standing Expiration Date:   01/10/2024   Ferritin    Standing Status:   Future    Number of Occurrences:   1    Standing Expiration Date:   01/10/2024   Iron and Iron Binding Capacity ([facility] only)    Standing Status:   Future    Number of Occurrences:   1    Standing Expiration Date:   01/10/2024    All questions were answered. The patient knows to call the clinic with any problems, questions or concerns.     Alla Feeling, NP 01/09/2023   Addendum I have seen the patient, examined her. I agree with the assessment and and plan and have edited the notes.   Patient is a 56 year old female with past medical history of hypertension, diabetes, stage IV CKD, stage Ia breast cancer diagnosed in 2018, on adjuvant tamoxifen, presented with screening discovered colon cancer.  She had a hemicolectomy on December 19, 2022.  I reviewed her surgical pathology findings with patient in detail, this is stage II disease, without high risk features, I do not recommend adjuvant chemotherapy.  We discussed her risk of recurrence which is low to moderate, and I recommend cancer surveillance for 5 years.  Patient agrees with the plan, all questions were answered.  Truitt Merle MD 01/09/2023

## 2023-01-14 ENCOUNTER — Telehealth: Payer: Self-pay

## 2023-01-14 NOTE — Telephone Encounter (Addendum)
Called patient and made her aware of results below as per Dr. Burr Medico    ----- Message from Alla Feeling, NP sent at 01/14/2023  9:39 AM EST ----- Please let pt know labs from consult shows CKD but stable creatinine 3.65, and moderate anemia hgb 9.3, which is improved from around the time of surgery. Iron level is normal, she does not need IV iron.  Thanks, Regan Rakers, NP

## 2023-02-08 NOTE — Congregational Nurse Program (Signed)
Patient has an appointment in May . Pharmacist provided education about medications

## 2023-03-08 ENCOUNTER — Telehealth: Payer: Self-pay | Admitting: Adult Health

## 2023-03-08 NOTE — Telephone Encounter (Signed)
Patient called to cancel upcoming appointment. 

## 2023-03-11 ENCOUNTER — Inpatient Hospital Stay: Payer: Self-pay | Admitting: Adult Health

## 2023-05-28 ENCOUNTER — Other Ambulatory Visit: Payer: Self-pay | Admitting: Adult Health

## 2023-05-28 DIAGNOSIS — Z170 Estrogen receptor positive status [ER+]: Secondary | ICD-10-CM

## 2023-06-17 ENCOUNTER — Encounter: Payer: Self-pay | Admitting: Registered Nurse

## 2023-07-09 ENCOUNTER — Other Ambulatory Visit: Payer: Self-pay

## 2023-07-09 ENCOUNTER — Other Ambulatory Visit: Payer: Self-pay | Admitting: Nurse Practitioner

## 2023-07-09 DIAGNOSIS — C183 Malignant neoplasm of hepatic flexure: Secondary | ICD-10-CM

## 2023-07-09 NOTE — Progress Notes (Signed)
Entry for lab orders

## 2023-07-09 NOTE — Progress Notes (Deleted)
Patient Care Team: Fatima Sanger, [name] as PCP - General (Internal Medicine) Lonie Peak, MD as Attending Physician (Radiation Oncology) Dorisann Frames, MD as Consulting Physician (Endocrinology) Crist Fat, MD as Attending Physician (Urology) Axel Filler, Larna Daughters, NP as Nurse Practitioner (Hematology and Oncology) Roger Kill, NP as Nurse Practitioner (Nephrology) Karie Soda, MD as Consulting Physician (General Surgery) Kathi Der, MD as Consulting Physician (Gastroenterology)   CHIEF COMPLAINT: H/o left breast cancer and right colon cancer   Oncology History  Malignant neoplasm of upper-inner quadrant of left breast in female, estrogen receptor positive (HCC)  11/27/2016 Initial Diagnosis   Malignant neoplasm of upper-inner quadrant of left breast in female, estrogen receptor positive (HCC)   12/11/2016 Genetic Testing   Patient has genetic testing done for personal and family history of breast cancer. RAD51D c.919G>A VUS identified on the Breast/Gyn Panel.  Negative genetic testing for the MSH2 inversion analysis (B[place]and inversion). The Breast/GYN gene panel offered by [facility] includes sequencing and rearrangement analysis for the following 23 genes:  ATM, BARD1, BRCA1, BRCA2, BRIP1, CDH1, CHEK2, EPCAM, FANCC, MLH1, MSH2, MSH6, MUTYH, NBN, NF1, PALB2, PMS2, POLD1, PTEN, RAD51C, RAD51D, RECQL, and TP53.     UPDATE: RAD51D c.919G>A VUS has been reclassified to "Likely Benign." The report date is 03/04/2019.    12/31/2016 Surgery   Left breast lumpectomy Ezzard Standing): IDC, 1.8cm, grade 3, margins negative, 1SLN negative, ER+(80%), PR-(2%), Ki-67 90%, HER-2 negative (ratio 1.05).    01/17/2017 - 05/16/2017 Adjuvant Chemotherapy   Doxorubicin and Cyclophosphamide x 4 cycles to be followed by weekly Abraxane   06/10/2017 - 07/08/2017 Radiation Therapy   Adjuvant radiation Basilio Cairo): 1.) Left Breast, 2.67 Gy in 15 fractions.  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 Levora Angel: -Perianal skin tags -Three 4 - 8 mm polyps in the cecum, ascending colon, and sigmoid colon -Likely malignant tumor in the transverse colon. Biopsied and tatooed -Internal hemorrhoids   10/04/2022 Initial Biopsy   Polyps: Tubulovillous adenoma. Tubular adenoma  2. LG intestine, transverse colon Invasive adenocarcinoma    10/10/2022 Imaging   CT CAP IMPRESSION: 1. No specific findings identified to suggest metastatic disease to the chest, abdomen or pelvis. 2. There is a focal area of circumferential luminal narrowing and wall thickening involving the hepatic flexure which is of uncertain clinical significance and correlation with colonoscopy findings is advised. 3. Gas is identified within the non dependent portion of the urinary bladder. Etiology indeterminate. Correlate for any history of recent instrumentation. If there is no history of recent instrumentation then findings are concerning for cystitis. 4.  Aortic Atherosclerosis (ICD10-I70.0).   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   12/19/2022 Cancer Staging   Staging form: Colon and Rectum, AJCC 8th Edition - Pathologic stage from 12/19/2022: Stage IIA (pT3, pN0, cM0) - Signed by Pollyann Samples, NP on 01/09/2023 Stage prefix: Initial diagnosis Total positive nodes: 0 Histologic grading system: 4 grade system Histologic grade (G): G2 Lymph-vascular invasion (LVI): LVI not present (absent)/not identified   12/19/2022 Definitive Surgery   PROCEDURE:   -ROBOTIC COLECTOMY PROXIMAL -INTRAOPERATIVE ASSESSMENT OF TISSUE VASCULAR PERFUSION USING ICG (indocyanine green) IMMUNOFLUORESCENCE -TRANSVERSUS ABDOMINIS PLANE (TAP) BLOCK - BILATERAL SURGEON:  Ardeth Sportsman,  MD ASSISTANT: Marin Olp, MD  12/19/2022 Pathology Results   FINAL MICROSCOPIC DIAGNOSIS:  A. COLON, PROXIMAL, RESECTION:  Invasive colonic adenocarcinoma, 4 cm  Carcinoma extends into pericolonic connective tissue  All surgical margins negative for carcinoma  Twenty lymph nodes negative for metastatic carcinoma (0/20)  See oncology table  pT3, pN0  MMR normal      CURRENT THERAPY: Tamoxifen for breast cancer and colon cancer surveillance  INTERVAL HISTORY Abigail Yoder return for follow up as scheduled. Last seen by me 01/09/23 as new patient. She continues tamoxifen and breast/colon cancer surveillance.   ROS   Past Medical History:  Diagnosis Date   Anemia    Breast cancer (HCC) 2017   Cancer of hepatic flexure (HCC)    Chronic kidney disease    Diabetes mellitus    Family history of breast cancer    History of left breast cancer    History of radiation therapy 06/10/17- 07/08/17   Left Breast 2.67 Gy in 15 fractions. Left Breast boost 2 Gy in 5 fractions   Hypertension    Obesity    Sleep apnea    NO CPAP     Past Surgical History:  Procedure Laterality Date   APPENDECTOMY     BREAST LUMPECTOMY WITH RADIOACTIVE SEED AND SENTINEL LYMPH NODE BIOPSY Left 12/31/2016   Procedure: BREAST LUMPECTOMY WITH RADIOACTIVE SEED AND SENTINEL LYMPH NODE BIOPSY;  Surgeon: Ovidio Kin, MD;  Location: [facility];  Service: General;  Laterality: Left;   CERVICAL SPINE SURGERY     CESAREAN SECTION     IR REMOVAL TUN ACCESS W/ PORT W/O FL MOD SED  10/18/2017   KNEE SURGERY Left    PORTACATH PLACEMENT N/A 12/31/2016   Procedure: INSERTION PORT-A-CATH WITH [place];  Surgeon: Ovidio Kin, MD;  Location: [facility];  Service: General;  Laterality: N/A;   TUBAL LIGATION       Outpatient Encounter Medications as of 07/10/2023  Medication Sig   amLODipine (NORVASC) 10 MG tablet Take 1 tablet (10 mg total) by mouth daily.   carvedilol (COREG) 25 MG tablet  Take 25 mg by mouth 2 (two) times daily.   hydrALAZINE (APRESOLINE) 50 MG tablet Take 50 mg by mouth 3 (three) times daily.   NOVOLOG MIX 70/30 FLEXPEN (70-30) 100 UNIT/ML FlexPen Inject 10-15 Units into the skin See admin instructions. Inject 15 units into the skin in the morning before breakfast, 10 units before lunch and 15 units before dinner   sodium bicarbonate 650 MG tablet Take 650 mg by mouth 2 (two) times daily.   tamoxifen (NOLVADEX) 20 MG tablet TAKE 1 TABLET BY MOUTH EVERY DAY   traMADol (ULTRAM) 50 MG tablet Take 1-2 tablets (50-100 mg total) by mouth every 6 (six) hours as needed for moderate pain. (Patient not taking: Reported on 01/09/2023)   [DISCONTINUED] prochlorperazine (COMPAZINE) 10 MG tablet Take 1 tablet (10 mg total) by mouth every 6 (six) hours as needed (Nausea or vomiting).   No facility-administered encounter medications on file as of 07/10/2023.     There were no vitals filed for this visit. There is no height or weight on file to calculate BMI.   PHYSICAL EXAM GENERAL:alert, no distress and comfortable SKIN: no rash  EYES: sclera clear NECK: without mass LYMPH:  no palpable cervical or supraclavicular lymphadenopathy  LUNGS: clear with normal breathing effort HEART: regular rate & rhythm, no lower extremity edema ABDOMEN: abdomen soft, non-tender and normal bowel sounds NEURO: alert & oriented x 3 with fluent speech,  no focal motor/sensory deficits Breast exam:  PAC without erythema    CBC    Component Value Date/Time   WBC 7.1 01/09/2023 1336   WBC 9.0 12/23/2022 0349   RBC 3.08 (L) 01/09/2023 1336   HGB 9.3 (L) 01/09/2023 1336   HGB 9.2 (L) 07/04/2017 0850   HCT 28.3 (L) 01/09/2023 1336   HCT 28.9 (L) 07/04/2017 0850   PLT 122 (L) 01/09/2023 1336   PLT 116 (L) 07/04/2017 0850   MCV 91.9 01/09/2023 1336   MCV 99.0 07/04/2017 0850   MCH 30.2 01/09/2023 1336   MCHC 32.9 01/09/2023 1336   RDW 15.8 (H) 01/09/2023 1336   RDW 15.0 (H) 07/04/2017  0850   LYMPHSABS 1.4 01/09/2023 1336   LYMPHSABS 0.8 (L) 07/04/2017 0850   MONOABS 0.5 01/09/2023 1336   MONOABS 0.3 07/04/2017 0850   EOSABS 0.2 01/09/2023 1336   EOSABS 0.1 07/04/2017 0850   BASOSABS 0.0 01/09/2023 1336   BASOSABS 0.0 07/04/2017 0850     CMP     Component Value Date/Time   NA 138 01/09/2023 1336   NA 141 07/04/2017 0850   K 4.5 01/09/2023 1336   K 3.8 07/04/2017 0850   CL 106 01/09/2023 1336   CO2 23 01/09/2023 1336   CO2 26 07/04/2017 0850   GLUCOSE 150 (H) 01/09/2023 1336   GLUCOSE 125 07/04/2017 0850   GLUCOSE 333 (H) 09/05/2006 1306   BUN 45 (H) 01/09/2023 1336   BUN 15.3 07/04/2017 0850   CREATININE 3.65 (H) 01/09/2023 1336   CREATININE 1.0 07/04/2017 0850   CALCIUM 8.8 (L) 01/09/2023 1336   CALCIUM 9.4 07/04/2017 0850   PROT 8.0 01/09/2023 1336   PROT 7.1 07/04/2017 0850   ALBUMIN 3.3 (L) 01/09/2023 1336   ALBUMIN 2.6 (L) 07/04/2017 0850   AST 17 01/09/2023 1336   AST 18 07/04/2017 0850   ALT 12 01/09/2023 1336   ALT 10 07/04/2017 0850   ALKPHOS 58 01/09/2023 1336   ALKPHOS 86 07/04/2017 0850   BILITOT 0.3 01/09/2023 1336   BILITOT <0.22 07/04/2017 0850   GFRNONAA 14 (L) 01/09/2023 1336   GFRAA 24 (L) 01/04/2020 1038     ASSESSMENT & PLAN: 57 year old female   Malignant neoplasm of hepatic flexure, G2, pT3N0M0 stage IIA, MMR normal -She was asymptomatic with positive Cologuard, found to have adenocarcinoma of the hepatic flexure on first colonoscopy.  Staging workup negative for distant metastasis -S/p proximal colectomy by Dr. Michaell Cowing on 12/19/2022 -Previous genetic testing with breast cancer dx included lynch syndrome, which was negative  -We discussed the role of guardant reveal to detect circulating tumor DNA, however even if detected, she would not be an ideal candidate for chemotherapy given her severe CKD. She did not tolerate adjuvant chemo for breast cancer. We opted to forego this testing   Malignant neoplasm of upper-inner left  breast, pT1c,N0M0, G3, ER+ (80% weakly positive) PR- HER2 - Ki67 90%, oncotype 60 -S/p lumpectomy 12/31/2016, path showed invasive ductal carcinoma, 1.8 cm, grade 3 with negative margins, 1 SLN negative -Genetic testing showed VUS in RAD51D that was reclassified as likely benign -Due to high risk oncotype 60, which predicts 34% recurrence risk in the future, she received adjuvant chemo ddAC x4. She had neutropenia, mucositis, and hospitalization with AC, and only received 4/12 cycles of weekly abraxane. Chemo discontinued early due to cytopenias.  -S/p adjuvant radiation 05/2017 - 06/2017 -Currently on Tamoxifen for a goal of 10 years, starting 07/2017. Tolerating well -Last mammogram 11/2022 was  benign   Anemia of chronic disease, CKD, DM, HTN -SHe has had mild anemia since at least 2008, Hgb 10-11 range, then worsened to 9-10 range in past few years -She had acute on chronic anemia during adjuvant chemotherapy for breast cancer, and again post op after colon cancer surgery.  -Has been on oral iron in the past but does not tolerate well due to constipation -She received 1 dose IV iron (ferrlecit 125 mg) on 2/1 and 1 u RBC on 12/22/22 -Scr range 3-4, eGFR ~11. She is followed by nephrology -We briefly discussed the role of ESA in anemia of chronic disease. Given that it is a growth factor, and her history of 2 malignancies, would like to hold off as long as possible.    4. Family history, age appropriate health maintenance -We reviewed her family's increased risk. Her children are 79 and 54, and she has 5 sisters and 1 brother. Her siblings have been screened and clear -She has discussed screening with her daughter age 85  -I encouraged her to engage in healthy active lifestyle, avoid smoking, limit alcohol, and continue age appropriate care    PLAN:  No orders of the defined types were placed in this encounter.     All questions were answered. The patient knows to call the clinic with any  problems, questions or concerns. No barriers to learning were detected. I spent *** counseling the patient face to face. The total time spent in the appointment was *** and more than 50% was on counseling, review of test results, and coordination of care.   Santiago Glad, NP-C @DATE @

## 2023-07-10 ENCOUNTER — Emergency Department (HOSPITAL_COMMUNITY)
Admission: EM | Admit: 2023-07-10 | Discharge: 2023-07-10 | Disposition: A | Discharge status: Home / Self Care | Payer: No Typology Code available for payment source | Attending: Emergency Medicine | Admitting: Emergency Medicine

## 2023-07-10 ENCOUNTER — Other Ambulatory Visit: Payer: Self-pay

## 2023-07-10 ENCOUNTER — Encounter (HOSPITAL_COMMUNITY): Payer: Self-pay

## 2023-07-10 ENCOUNTER — Emergency Department (HOSPITAL_COMMUNITY): Payer: No Typology Code available for payment source

## 2023-07-10 ENCOUNTER — Inpatient Hospital Stay: Payer: No Typology Code available for payment source

## 2023-07-10 ENCOUNTER — Inpatient Hospital Stay: Payer: No Typology Code available for payment source | Admitting: Nurse Practitioner

## 2023-07-10 DIAGNOSIS — M542 Cervicalgia: Secondary | ICD-10-CM | POA: Diagnosis not present

## 2023-07-10 DIAGNOSIS — Z794 Long term (current) use of insulin: Secondary | ICD-10-CM | POA: Insufficient documentation

## 2023-07-10 DIAGNOSIS — Y92410 Unspecified street and highway as the place of occurrence of the external cause: Secondary | ICD-10-CM | POA: Insufficient documentation

## 2023-07-10 LAB — CBG MONITORING, ED: Glucose-Capillary: 200 mg/dL — ABNORMAL HIGH (ref 70–99)
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
## 2023-07-10 NOTE — ED Notes (Signed)
C collar placed on pt in triage.

## 2023-07-10 NOTE — Discharge Instructions (Signed)
You will hurt worse tomorrow that is normal.  Please follow-up with your family doctor.  If you continue to have ongoing discomfort then they may choose to reimage you in about a week.
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
## 2023-07-15 ENCOUNTER — Telehealth: Payer: Self-pay | Admitting: Nurse Practitioner

## 2023-09-19 ENCOUNTER — Encounter: Payer: Self-pay | Admitting: Nurse Practitioner

## 2023-09-19 ENCOUNTER — Inpatient Hospital Stay (HOSPITAL_BASED_OUTPATIENT_CLINIC_OR_DEPARTMENT_OTHER): Payer: No Typology Code available for payment source | Admitting: Nurse Practitioner

## 2023-09-19 ENCOUNTER — Inpatient Hospital Stay: Payer: No Typology Code available for payment source | Attending: Hematology

## 2023-09-19 VITALS — BP 157/79 | HR 98 | Temp 97.3°F | Resp 20 | Wt 235.5 lb

## 2023-09-19 DIAGNOSIS — Z170 Estrogen receptor positive status [ER+]: Secondary | ICD-10-CM | POA: Diagnosis not present

## 2023-09-19 DIAGNOSIS — Z85038 Personal history of other malignant neoplasm of large intestine: Secondary | ICD-10-CM | POA: Diagnosis not present

## 2023-09-19 DIAGNOSIS — Z923 Personal history of irradiation: Secondary | ICD-10-CM | POA: Diagnosis not present

## 2023-09-19 DIAGNOSIS — C183 Malignant neoplasm of hepatic flexure: Secondary | ICD-10-CM

## 2023-09-19 DIAGNOSIS — I129 Hypertensive chronic kidney disease with stage 1 through stage 4 chronic kidney disease, or unspecified chronic kidney disease: Secondary | ICD-10-CM | POA: Diagnosis not present

## 2023-09-19 DIAGNOSIS — Z803 Family history of malignant neoplasm of breast: Secondary | ICD-10-CM | POA: Diagnosis not present

## 2023-09-19 DIAGNOSIS — C50412 Malignant neoplasm of upper-outer quadrant of left female breast: Secondary | ICD-10-CM | POA: Insufficient documentation

## 2023-09-19 DIAGNOSIS — D631 Anemia in chronic kidney disease: Secondary | ICD-10-CM | POA: Diagnosis not present

## 2023-09-19 DIAGNOSIS — E1122 Type 2 diabetes mellitus with diabetic chronic kidney disease: Secondary | ICD-10-CM | POA: Insufficient documentation

## 2023-09-19 DIAGNOSIS — N189 Chronic kidney disease, unspecified: Secondary | ICD-10-CM | POA: Insufficient documentation

## 2023-09-19 LAB — CBC WITH DIFFERENTIAL (CANCER CENTER ONLY)
Abs Immature Granulocytes: 0.02 10*3/uL (ref 0.00–0.07)
Basophils Absolute: 0 10*3/uL (ref 0.0–0.1)
Basophils Relative: 0 %
Eosinophils Absolute: 0.1 10*3/uL (ref 0.0–0.5)
Eosinophils Relative: 2 %
HCT: 28.4 % — ABNORMAL LOW (ref 36.0–46.0)
Hemoglobin: 9.3 g/dL — ABNORMAL LOW (ref 12.0–15.0)
Immature Granulocytes: 0 %
Lymphocytes Relative: 25 %
Lymphs Abs: 1.4 10*3/uL (ref 0.7–4.0)
MCH: 30.3 pg (ref 26.0–34.0)
MCHC: 32.7 g/dL (ref 30.0–36.0)
MCV: 92.5 fL (ref 80.0–100.0)
Monocytes Absolute: 0.4 10*3/uL (ref 0.1–1.0)
Monocytes Relative: 8 %
Neutro Abs: 3.5 10*3/uL (ref 1.7–7.7)
Neutrophils Relative %: 65 %
Platelet Count: 165 10*3/uL (ref 150–400)
RBC: 3.07 MIL/uL — ABNORMAL LOW (ref 3.87–5.11)
RDW: 14.9 % (ref 11.5–15.5)
WBC Count: 5.5 10*3/uL (ref 4.0–10.5)
nRBC: 0 % (ref 0.0–0.2)

## 2023-09-19 LAB — CMP (CANCER CENTER ONLY)
ALT: 14 U/L (ref 0–44)
AST: 16 U/L (ref 15–41)
Albumin: 3.5 g/dL (ref 3.5–5.0)
Alkaline Phosphatase: 95 U/L (ref 38–126)
Anion gap: 5 (ref 5–15)
BUN: 57 mg/dL — ABNORMAL HIGH (ref 6–20)
CO2: 24 mmol/L (ref 22–32)
Calcium: 8.2 mg/dL — ABNORMAL LOW (ref 8.9–10.3)
Chloride: 110 mmol/L (ref 98–111)
Creatinine: 3.8 mg/dL — ABNORMAL HIGH (ref 0.44–1.00)
GFR, Estimated: 13 mL/min — ABNORMAL LOW (ref >60)
Glucose, Bld: 203 mg/dL — ABNORMAL HIGH (ref 70–99)
Potassium: 5.1 mmol/L (ref 3.5–5.1)
Sodium: 139 mmol/L (ref 135–145)
Total Bilirubin: 0.3 mg/dL (ref 0.3–1.2)
Total Protein: 7.7 g/dL (ref 6.5–8.1)

## 2023-09-19 LAB — CEA (ACCESS): CEA (CHCC): 1.66 ng/mL (ref 0.00–5.00)

## 2023-09-19 NOTE — Progress Notes (Signed)
Patient Care Team: Fatima Sanger, [name] as PCP - General (Internal Medicine) Lonie Peak, MD as Attending Physician (Radiation Oncology) Dorisann Frames, MD as Consulting Physician (Endocrinology) Crist Fat, MD as Attending Physician (Urology) Axel Filler, Larna Daughters, NP as Nurse Practitioner (Hematology and Oncology) Roger Kill, NP as Nurse Practitioner (Nephrology) Karie Soda, MD as Consulting Physician (General Surgery) Kathi Der, MD as Consulting Physician (Gastroenterology)   CHIEF COMPLAINT: Follow-up history of colon and breast cancers  Oncology History  Malignant neoplasm of upper-inner quadrant of left breast in female, estrogen receptor positive (HCC)  11/27/2016 Initial Diagnosis   Malignant neoplasm of upper-inner quadrant of left breast in female, estrogen receptor positive (HCC)   12/11/2016 Genetic Testing   Patient has genetic testing done for personal and family history of breast cancer. RAD51D c.919G>A VUS identified on the Breast/Gyn Panel.  Negative genetic testing for the MSH2 inversion analysis ([place] inversion). The Breast/GYN gene panel offered by [facility] includes sequencing and rearrangement analysis for the following 23 genes:  ATM, BARD1, BRCA1, BRCA2, BRIP1, CDH1, CHEK2, EPCAM, FANCC, MLH1, MSH2, MSH6, MUTYH, NBN, NF1, PALB2, PMS2, POLD1, PTEN, RAD51C, RAD51D, RECQL, and TP53.     UPDATE: RAD51D c.919G>A VUS has been reclassified to "Likely Benign." The report date is 03/04/2019.    12/31/2016 Surgery   Left breast lumpectomy Ezzard Standing): IDC, 1.8cm, grade 3, margins negative, 1SLN negative, ER+(80%), PR-(2%), Ki-67 90%, HER-2 negative (ratio 1.05).    01/17/2017 - 05/16/2017 Adjuvant Chemotherapy   Doxorubicin and Cyclophosphamide x 4 cycles to be followed by weekly Abraxane   06/10/2017 - 07/08/2017 Radiation Therapy   Adjuvant radiation Basilio Cairo): 1.) Left Breast, 2.67 Gy in 15 fractions.  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 Levora Angel: -Perianal skin tags -Three 4 - 8 mm polyps in the cecum, ascending colon, and sigmoid colon -Likely malignant tumor in the transverse colon. Biopsied and tatooed -Internal hemorrhoids   10/04/2022 Initial Biopsy   Polyps: Tubulovillous adenoma. Tubular adenoma  2. LG intestine, transverse colon Invasive adenocarcinoma    10/10/2022 Imaging   CT CAP IMPRESSION: 1. No specific findings identified to suggest metastatic disease to the chest, abdomen or pelvis. 2. There is a focal area of circumferential luminal narrowing and wall thickening involving the hepatic flexure which is of uncertain clinical significance and correlation with colonoscopy findings is advised. 3. Gas is identified within the non dependent portion of the urinary bladder. Etiology indeterminate. Correlate for any history of recent instrumentation. If there is no history of recent instrumentation then findings are concerning for cystitis. 4.  Aortic Atherosclerosis (ICD10-I70.0).   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   12/19/2022 Cancer Staging   Staging form: Colon and Rectum, AJCC 8th Edition - Pathologic stage from 12/19/2022: Stage IIA (pT3, pN0, cM0) - Signed by Pollyann Samples, NP on 01/09/2023 Stage prefix: Initial diagnosis Total positive nodes: 0 Histologic grading system: 4 grade system Histologic grade (G): G2 Lymph-vascular invasion (LVI): LVI not present (absent)/not identified   12/19/2022 Definitive Surgery   PROCEDURE:   -ROBOTIC COLECTOMY PROXIMAL -INTRAOPERATIVE ASSESSMENT OF TISSUE VASCULAR PERFUSION USING ICG (indocyanine green) IMMUNOFLUORESCENCE -TRANSVERSUS ABDOMINIS PLANE (TAP) BLOCK - BILATERAL SURGEON:  Ardeth Sportsman, MD ASSISTANT:  Marin Olp, MD   12/19/2022 Pathology  Results   FINAL MICROSCOPIC DIAGNOSIS:  A. COLON, PROXIMAL, RESECTION:  Invasive colonic adenocarcinoma, 4 cm  Carcinoma extends into pericolonic connective tissue  All surgical margins negative for carcinoma  Twenty lymph nodes negative for metastatic carcinoma (0/20)  See oncology table  pT3, pN0  MMR normal      CURRENT THERAPY: Surveillance and tamoxifen for history of breast cancer  INTERVAL HISTORY Ms. Weck returns for follow-up as scheduled, last seen by me 01/09/2023.  She has been doing well in the interim with no significant changes in her health.  Eating and drinking well with good energy level.  Denies unintentional weight loss, change in bowel habits, black or bloody stools, or abdominal pain/bloating.  She continues tamoxifen, tolerating well without significant side effects.  She will be due a mammogram in January.  ROS  All other systems reviewed and negative  Past Medical History:  Diagnosis Date   Anemia    Breast cancer (HCC) 2017   Cancer of hepatic flexure (HCC)    Chronic kidney disease    Diabetes mellitus    Family history of breast cancer    History of left breast cancer    History of radiation therapy 06/10/17- 07/08/17   Left Breast 2.67 Gy in 15 fractions. Left Breast boost 2 Gy in 5 fractions   Hypertension    Obesity    Sleep apnea    NO CPAP     Past Surgical History:  Procedure Laterality Date   APPENDECTOMY     BREAST LUMPECTOMY WITH RADIOACTIVE SEED AND SENTINEL LYMPH NODE BIOPSY Left 12/31/2016   Procedure: BREAST LUMPECTOMY WITH RADIOACTIVE SEED AND SENTINEL LYMPH NODE BIOPSY;  Surgeon: Ovidio Kin, MD;  Location: [facility];  Service: General;  Laterality: Left;   CERVICAL SPINE SURGERY     CESAREAN SECTION     IR REMOVAL TUN ACCESS W/ PORT W/O FL MOD SED  10/18/2017   KNEE SURGERY Left    PORTACATH PLACEMENT N/A 12/31/2016   Procedure: INSERTION PORT-A-CATH WITH [place];   Surgeon: Ovidio Kin, MD;  Location: [facility];  Service: General;  Laterality: N/A;   TUBAL LIGATION       Outpatient Encounter Medications as of 09/19/2023  Medication Sig   amLODipine (NORVASC) 10 MG tablet Take 1 tablet (10 mg total) by mouth daily.   carvedilol (COREG) 25 MG tablet Take 25 mg by mouth 2 (two) times daily.   hydrALAZINE (APRESOLINE) 50 MG tablet Take 50 mg by mouth 3 (three) times daily.   JANUVIA 25 MG tablet Take 10 mg by mouth daily.   NOVOLOG MIX 70/30 FLEXPEN (70-30) 100 UNIT/ML FlexPen Inject 10-15 Units into the skin See admin instructions. Inject 15 units into the skin in the morning before breakfast, 10 units before lunch and 15 units before dinner   sodium bicarbonate 650 MG tablet Take 650 mg by mouth 2 (two) times daily.   tamoxifen (NOLVADEX) 20 MG tablet TAKE 1 TABLET BY MOUTH EVERY DAY   traMADol (ULTRAM) 50 MG tablet Take 1-2 tablets (50-100 mg total) by mouth every 6 (six) hours as needed for moderate pain.   [DISCONTINUED] prochlorperazine (COMPAZINE) 10 MG tablet Take 1 tablet (10 mg total) by mouth every 6 (six) hours as needed (Nausea or vomiting).   No facility-administered encounter medications on file as of 09/19/2023.     Today's Vitals   09/19/23 1100 09/19/23 1201  BP:  (!) 157/79  Pulse:  98  Resp:  20  Temp:  (!) 97.3 F (36.3 C)  SpO2:  99%  Weight:  235 lb 8 oz (106.8 kg)  PainSc: 0-No pain    Body mass index is 41.72 kg/m.   PHYSICAL EXAM GENERAL:alert, no distress and comfortable SKIN: no rash  EYES: sclera clear NECK: without mass LYMPH:  no palpable cervical or supraclavicular lymphadenopathy  LUNGS: clear with normal breathing effort HEART: regular rate & rhythm, no lower extremity edema ABDOMEN: abdomen soft, non-tender and normal bowel sounds NEURO: alert & oriented x 3 with fluent speech, no focal motor/sensory deficits Breast exam: S/p left lumpectomy and radiation, incisions completely healed.   Left breast is firm.  No palpable mass or nodularity in either breast or axilla that I could appreciate.   CBC    Component Value Date/Time   WBC 5.5 09/19/2023 1028   WBC 9.0 12/23/2022 0349   RBC 3.07 (L) 09/19/2023 1028   HGB 9.3 (L) 09/19/2023 1028   HGB 9.2 (L) 07/04/2017 0850   HCT 28.4 (L) 09/19/2023 1028   HCT 28.9 (L) 07/04/2017 0850   PLT 165 09/19/2023 1028   PLT 116 (L) 07/04/2017 0850   MCV 92.5 09/19/2023 1028   MCV 99.0 07/04/2017 0850   MCH 30.3 09/19/2023 1028   MCHC 32.7 09/19/2023 1028   RDW 14.9 09/19/2023 1028   RDW 15.0 (H) 07/04/2017 0850   LYMPHSABS 1.4 09/19/2023 1028   LYMPHSABS 0.8 (L) 07/04/2017 0850   MONOABS 0.4 09/19/2023 1028   MONOABS 0.3 07/04/2017 0850   EOSABS 0.1 09/19/2023 1028   EOSABS 0.1 07/04/2017 0850   BASOSABS 0.0 09/19/2023 1028   BASOSABS 0.0 07/04/2017 0850     CMP     Component Value Date/Time   NA 139 09/19/2023 1028   NA 141 07/04/2017 0850   K 5.1 09/19/2023 1028   K 3.8 07/04/2017 0850   CL 110 09/19/2023 1028   CO2 24 09/19/2023 1028   CO2 26 07/04/2017 0850   GLUCOSE 203 (H) 09/19/2023 1028   GLUCOSE 125 07/04/2017 0850   GLUCOSE 333 (H) 09/05/2006 1306   BUN 57 (H) 09/19/2023 1028   BUN 15.3 07/04/2017 0850   CREATININE 3.80 (H) 09/19/2023 1028   CREATININE 1.0 07/04/2017 0850   CALCIUM 8.2 (L) 09/19/2023 1028   CALCIUM 9.4 07/04/2017 0850   PROT 7.7 09/19/2023 1028   PROT 7.1 07/04/2017 0850   ALBUMIN 3.5 09/19/2023 1028   ALBUMIN 2.6 (L) 07/04/2017 0850   AST 16 09/19/2023 1028   AST 18 07/04/2017 0850   ALT 14 09/19/2023 1028   ALT 10 07/04/2017 0850   ALKPHOS 95 09/19/2023 1028   ALKPHOS 86 07/04/2017 0850   BILITOT 0.3 09/19/2023 1028   BILITOT <0.22 07/04/2017 0850   GFRNONAA 13 (L) 09/19/2023 1028   GFRAA 24 (L) 01/04/2020 1038     ASSESSMENT & PLAN:57 year old female   Malignant neoplasm of hepatic flexure, G2, pT3N0M0 stage IIA, MMR normal -She was asymptomatic with positive  Cologuard, found to have adenocarcinoma of the hepatic flexure on first colonoscopy.  Staging workup negative for distant metastasis -S/p proximal colectomy by Dr. Michaell Cowing on 12/19/2022, there were no high risk features.  -Previous genetic testing with breast cancer dx included lynch syndrome, which was negative  -We reviewed the low to moderate recurrence risk in stage II colon cancer, around 20-25%. -We previously discussed the role of guardant reveal to detect circulating tumor DNA, however even if detected, she would not be an ideal candidate for chemotherapy given  her severe CKD. She did not tolerate adjuvant chemo for breast cancer. We opted to forego this testing -Ms. Pleasant is clinically doing well.  Exam is benign, labs are unremarkable.  Overall no clinical concern for recurrence.  Continue colon cancer surveillance -Proceed with surveillance CT, I will call with results. -She is due colonoscopy at the end of January, I will CC my note to Dr. Levora Angel -Follow-up in 6 months, or sooner if needed  2. Malignant neoplasm of upper-inner left breast, pT1c,N0M0, G3, ER+ (80% weakly positive) PR- HER2 - Ki67 90%, oncotype 60 -S/p lumpectomy 12/31/2016, path showed invasive ductal carcinoma, 1.8 cm, grade 3 with negative margins, 1 SLN negative -Genetic testing showed VUS in RAD51D that was reclassified as likely benign -Due to high risk oncotype 60, which predicts 34% recurrence risk in the future, she received adjuvant chemo ddAC x4. She had neutropenia, mucositis, and hospitalization with AC, and only received 4/12 cycles of weekly abraxane. Chemo discontinued early due to cytopenias.  -S/p adjuvant radiation 05/2017 - 06/2017 -Currently on Tamoxifen for a goal of 10 years, starting 07/2017. Tolerating well -Managed by Dr. Darnelle Catalan in the past -Last mammogram 11/2022 was benign, she will call [facility] to schedule 11/2023 mammo -tolerating tamoxifen well.  Exam is benign, no clinical concern for breast  cancer recurrence.  Continue surveillance   Anemia of chronic disease, CKD, DM, HTN -SHe has had mild anemia since at least 2008, Hgb 10-11 range, then worsened to 9-10 range in past few years -She had acute on chronic anemia during adjuvant chemotherapy for breast cancer, and again post op after colon cancer surgery.  -Has been on oral iron in the past but does not tolerate well due to constipation -She received 1 dose IV iron (ferrlecit 125 mg) on 2/1 and 1 u RBC on 12/22/22 -Scr range 3-4, eGFR ~11. She is followed by nephrology - We briefly discussed the role of ESA in anemia of chronic disease. Given that it is a growth factor, and her history of 2 malignancies, would like to hold off as long as possible.  -Anemia is stable   4. Family history, age appropriate health maintenance -We reviewed her family's increased risk. Her children are 85 and 23, and she has 5 sisters and 1 brother. Her siblings have been screened and clear -She has discussed screening with her daughter age 20  -I encouraged her to engage in healthy active lifestyle, avoid smoking, limit alcohol, and continue age appropriate care    PLAN: -Labs reviewed -Continue colon and breast cancer surveillance, and Tamoxifen -Surveillance CT in next few weeks, I will call her with results -Due mammogram and colonoscopy at the end of January 2025 -F/up in 6 months, or sooner if needed   Orders Placed This Encounter  Procedures   CT ABDOMEN PELVIS WO CONTRAST    Standing Status:   Future    Standing Expiration Date:   09/18/2024    Order Specific Question:   Is patient pregnant?    Answer:   No    Order Specific Question:   Preferred imaging location?    Answer:   [facility]    Order Specific Question:   If indicated for the ordered procedure, I authorize the administration of oral contrast media per Radiology protocol    Answer:   Yes    Order Specific Question:   Does the patient have a contrast media/X-ray dye  allergy?    Answer:   No  All questions were answered. The patient knows to call the clinic with any problems, questions or concerns. No barriers to learning were detected. I spent 20 minutes counseling the patient face to face. The total time spent in the appointment was 30 minutes and more than 50% was on counseling, review of test results, and coordination of care.   Santiago Glad, NP-C 09/19/2023

## 2023-10-08 ENCOUNTER — Ambulatory Visit (HOSPITAL_COMMUNITY)
Admission: RE | Admit: 2023-10-08 | Discharge: 2023-10-08 | Disposition: A | Discharge status: Home / Self Care | Payer: No Typology Code available for payment source | Source: Ambulatory Visit | Attending: Nurse Practitioner | Admitting: Nurse Practitioner

## 2023-10-08 DIAGNOSIS — C183 Malignant neoplasm of hepatic flexure: Secondary | ICD-10-CM | POA: Insufficient documentation

## 2023-10-08 MED ORDER — IOHEXOL 300 MG/ML  SOLN
30.0000 mL | Freq: Once | INTRAMUSCULAR | Status: AC | PRN
  Administered 2023-10-08: 30 mL via ORAL

## 2023-10-21 ENCOUNTER — Telehealth: Payer: Self-pay

## 2023-10-21 NOTE — Telephone Encounter (Signed)
-----   Message from Pollyann Samples sent at 10/20/2023 11:10 PM EST ----- Please let pt know CT shows no evidence of cancer recurrence, great news. Continue surveillance and keep scheduled appts.   Thanks Lacie NP

## 2023-10-21 NOTE — Telephone Encounter (Signed)
Pt advised with VU and agreed to this plan of care.

## 2023-12-19 ENCOUNTER — Encounter: Payer: Self-pay | Admitting: Nurse Practitioner

## 2024-01-31 ENCOUNTER — Telehealth: Payer: Self-pay | Admitting: Nurse Practitioner

## 2024-01-31 NOTE — Telephone Encounter (Signed)
 Patient is aware of rescheduled appointment times/dates due to provider being out of office, patient has callback number for scheduling line if needing to cancel or reschedule appointment

## 2024-03-02 ENCOUNTER — Ambulatory Visit: Payer: No Typology Code available for payment source | Admitting: Nurse Practitioner

## 2024-03-02 ENCOUNTER — Other Ambulatory Visit: Payer: No Typology Code available for payment source

## 2024-03-05 ENCOUNTER — Other Ambulatory Visit: Payer: Self-pay

## 2024-03-05 DIAGNOSIS — Z170 Estrogen receptor positive status [ER+]: Secondary | ICD-10-CM

## 2024-03-05 DIAGNOSIS — C183 Malignant neoplasm of hepatic flexure: Secondary | ICD-10-CM

## 2024-03-05 NOTE — Assessment & Plan Note (Addendum)
 G2, pT3N0M0 stage IIA, MMR normal -She was asymptomatic with positive Cologuard, found to have adenocarcinoma of the hepatic flexure on first colonoscopy.  Staging workup negative for distant metastasis -S/p proximal colectomy by Dr. Hershell Lose on 12/19/2022, there were no high risk features.  -Previous genetic testing with breast cancer dx included lynch syndrome, which was negative  -We reviewed the low to moderate recurrence risk in stage II colon cancer, around 20-25%. -We previously discussed the role of guardant reveal to detect circulating tumor DNA, however even if detected, she would not be an ideal candidate for chemotherapy given her severe CKD. She did not tolerate adjuvant chemo for breast cancer. We opted to forego this testing -Abigail Yoder is clinically doing well.  Exam is benign, labs are unremarkable.  Overall no clinical concern for recurrence.  Continue colon cancer surveillance -Surveillance CT CAP done November 2024.  No evidence of recurrence, lymphadenopathy, or metastatic disease. -She has delayed colonoscopy due to financial concerns.  Will schedule one as soon as she is able to.  Meantime we will continue yearly CT scans for colon cancer surveillance. -Follow-up in 6 months, or sooner if needed.

## 2024-03-05 NOTE — Assessment & Plan Note (Addendum)
 pT1c,N0M0, G3, ER+ (80% weakly positive) PR- HER2 - Ki67 90%, oncotype 60 -S/p lumpectomy 12/31/2016, path showed invasive ductal carcinoma, 1.8 cm, grade 3 with negative margins, 1 SLN negative -Genetic testing showed VUS in RAD51D that was reclassified as likely benign -Due to high risk oncotype 60, which predicts 34% recurrence risk in the future, she received adjuvant chemo ddAC x4. She had neutropenia, mucositis, and hospitalization with AC, and only received 4/12 cycles of weekly abraxane . Chemo discontinued early due to cytopenias.  -S/p adjuvant radiation 05/2017 - 06/2017 -Currently on Tamoxifen  for a goal of 10 years, starting 07/2017. Tolerating well -Managed by Dr. Charolett Copes in the past -tolerating tamoxifen  well.  Exam is benign, no clinical concern for breast cancer recurrence.  Continue surveillance 3D screening mammogram done 11/2023 was benign.  Scheduled for 3D screening mammogram in January 2026.

## 2024-03-05 NOTE — Progress Notes (Unsigned)
 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  Staging workup negative for distant metastasis -S/p proximal colectomy by Dr. Hershell Lose on 12/19/2022, there were no high risk features.  -Previous genetic testing with breast cancer dx included lynch syndrome, which was negative  -We reviewed the low to moderate recurrence risk in stage II colon cancer, around 20-25%. -We previously discussed the role of guardant reveal to detect circulating tumor DNA, however even if detected, she would not be an ideal candidate for chemotherapy given her severe CKD. She did not tolerate adjuvant chemo for breast cancer. We opted to forego this testing -Ms. Levandowski is clinically doing well.  Exam is benign, labs are unremarkable.  Overall no clinical concern for recurrence.  Continue colon cancer surveillance -Surveillance CT CAP done November 2024.  No evidence of recurrence, lymphadenopathy, or metastatic disease. -She has delayed colonoscopy due to financial concerns.  Will schedule one as soon as she is able to.  Meantime we will continue yearly CT scans for colon cancer surveillance. -Follow-up in 6 months, or sooner if  needed.   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 She had neutropenia, mucositis, and hospitalization with AC, and only received 4/12 cycles of weekly abraxane . Chemo discontinued early due to cytopenias.  -S/p adjuvant radiation 05/2017 - 06/2017 -Currently on Tamoxifen  for a goal of 10 years, starting 07/2017. Tolerating well -Managed by Dr. Charolett Copes in the past -tolerating tamoxifen  well.  Exam is benign, no clinical concern for breast cancer recurrence.  Continue surveillance 3D screening mammogram done 11/2023 was benign.  Scheduled for 3D screening mammogram in January 2026.    Iron deficiency anemia Likely secondary to CKD.  Hgb 9.1, HCT 27.2.  Stable.  Continue to monitor.  CKD Worsening.  BUN 62, creatinine 4.58, EGFR 11.  Patient sees nephrology next week.  Hemodialysis has been introduced and will be discussed further between patient and nephrology vessels.  Plan Reviewed labs.  Moderate and stable anemia likely related to CKD.  Worsening renal functions.  Patient to see nephrology in near future. Most recent mammogram done January 2025 was benign.  She is already scheduled for next mammogram in January 2026. Continue tamoxifen  daily. Overdue for colonoscopy.  Due to financial reasons, she has declined this test.  Reviewed most recent CT CAP which showed no evidence of recurrence, lymphadenopathy or metastatic disease.  Repeat CT CAP in November 2025. Continue with breast and colon cancer  surveillance.  Follow-up with labs in 6 months.  She  should return sooner if needed.  The patient understands the plans discussed today and is in agreement with them.  She knows to contact our office if she develops concerns prior to her next appointment.  I provided 25 minutes of face-to-face time during this encounter and > 50% was spent counseling as documented under my assessment and plan.    Sharyon Deis, NP  [facility] - A DEPT OF [facility] [address] [place] [address] Dept: [phone] Dept Fax: [phone]   No orders of the defined types were placed in this encounter.     CHIEF COMPLAINT:  CC: history of left breast cancer and cancer of colon at hepatic flexure.   Current Treatment:  tamoxifen  and surveillance.   INTERVAL HISTORY:  Abigail Yoder is here today for repeat clinical assessment. She was last seen by Lacie, NP on 09/19/2023. She had benign mammogram on 12/18/2023 at [facility]. Was due for follow up colonoscopy I/2025. CT abdomen and pelvis done 10/19/2023 showed no evidence of recurrent or metastatic disease.  Has delayed follow-up colonoscopy due to financial concerns.  She denies chest pain, chest pressure, or shortness of breath. She denies headaches or visual disturbances. She denies abdominal pain, nausea, vomiting, or changes in bowel or bladder habits.  She denies fevers or chills. She denies pain. Her appetite is good. Her weight has been stable.  I have reviewed the past medical history, past surgical history, social history and family history with the patient and they are unchanged from previous note.  ALLERGIES:  is allergic to nsaids and metformin  hcl er.  MEDICATIONS:  Current Outpatient Medications  Medication Sig Dispense Refill   amLODipine  (NORVASC ) 10 MG tablet Take 1 tablet (10 mg total) by mouth daily.     carvedilol  (COREG ) 25 MG tablet Take 25 mg by mouth 2 (two) times daily.     hydrALAZINE  (APRESOLINE ) 50 MG  tablet Take 50 mg by mouth 3 (three) times daily.     JANUVIA 25 MG tablet Take 10 mg by mouth daily.     NOVOLOG  MIX 70/30 FLEXPEN (70-30) 100 UNIT/ML FlexPen Inject 10-15 Units into the skin See admin instructions. Inject 15 units into the skin in the morning before breakfast, 10 units before lunch and 15 units before dinner     sodium bicarbonate  650 MG tablet Take 650 mg by mouth 2 (two) times daily.     tamoxifen  (NOLVADEX ) 20 MG tablet TAKE 1 TABLET BY MOUTH EVERY DAY 90 tablet 4   traMADol  (ULTRAM ) 50 MG tablet Take 1-2 tablets (50-100 mg total) by mouth every 6 (six) hours as needed for moderate pain. 20 tablet 0   No current facility-administered medications for this visit.    HISTORY OF PRESENT ILLNESS:   Oncology History  Malignant neoplasm of upper-inner quadrant of left breast in female, estrogen receptor positive (HCC)  11/27/2016 Initial Diagnosis   Malignant neoplasm of upper-inner quadrant of left breast in female, estrogen receptor positive (HCC)   12/11/2016 Genetic Testing   Patient has genetic testing done for personal and family history of breast cancer. RAD51D c.919G>A VUS identified on the Breast/Gyn Panel.  Negative genetic testing for the MSH2 inversion analysis (Boland inversion). The Breast/GYN gene panel offered by [facility] includes sequencing and rearrangement analysis for the following 23 genes:  ATM, BARD1, BRCA1, BRCA2, BRIP1, CDH1, CHEK2, EPCAM, FANCC,  MLH1, MSH2, MSH6, MUTYH, NBN, NF1, PALB2, PMS2, POLD1, PTEN, RAD51C, RAD51D, RECQL, and TP53.     UPDATE: RAD51D c.919G>A VUS has been reclassified to "Likely Benign." The report date is 03/04/2019.    12/31/2016 Surgery   Left breast lumpectomy [place]): IDC, 1.8cm, grade 3, margins negative, 1SLN negative, ER+(80%), PR-(2%), Ki-67 90%, HER-2 negative (ratio 1.05).    01/17/2017 - 05/16/2017 Adjuvant Chemotherapy   Doxorubicin  and Cyclophosphamide  x 4 cycles to be followed by weekly Abraxane    06/10/2017 - 07/08/2017  Radiation Therapy   Adjuvant radiation [facility]): 1.) Left Breast, 2.67 Gy in 15 fractions.  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 Veronda Goody: -Perianal skin tags -Three 4 - 8 mm polyps in the cecum, ascending colon, and sigmoid colon -Likely malignant tumor in the transverse colon. Biopsied and tatooed -Internal hemorrhoids   10/04/2022 Initial Biopsy   Polyps: Tubulovillous adenoma. Tubular adenoma  2. LG intestine, transverse colon Invasive adenocarcinoma    10/10/2022 Imaging   CT CAP IMPRESSION: 1. No specific findings identified to suggest metastatic disease to the chest, abdomen or pelvis. 2. There is a focal area of circumferential luminal narrowing and wall thickening involving the hepatic flexure which is of uncertain clinical significance and correlation with colonoscopy findings is advised. 3. Gas is identified within the non dependent portion of the urinary bladder. Etiology indeterminate. Correlate for any history of recent instrumentation. If there is no history of recent instrumentation then findings are concerning for cystitis. 4.  Aortic Atherosclerosis (ICD10-I70.0).   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   12/19/2022 Cancer Staging   Staging form: Colon and Rectum, AJCC 8th Edition - Pathologic stage from 12/19/2022: Stage IIA (pT3, pN0, cM0) - Signed by Rolin Clifton, NP on 01/09/2023 Stage prefix: Initial diagnosis Total positive nodes: 0 Histologic grading system: 4 grade system Histologic grade (G): G2 Lymph-vascular invasion (LVI): LVI not present (absent)/not identified   12/19/2022 Definitive Surgery   PROCEDURE:   -ROBOTIC COLECTOMY PROXIMAL -INTRAOPERATIVE ASSESSMENT OF TISSUE VASCULAR PERFUSION USING ICG  (indocyanine green) IMMUNOFLUORESCENCE -TRANSVERSUS ABDOMINIS PLANE (TAP) BLOCK - BILATERAL SURGEON:  Eddye Goodie, MD ASSISTANT: Beatris Lincoln, MD   12/19/2022 Pathology Results   FINAL MICROSCOPIC DIAGNOSIS:  A. COLON, PROXIMAL, RESECTION:  Invasive colonic adenocarcinoma, 4 cm  Carcinoma extends into pericolonic connective tissue  All surgical margins negative for carcinoma  Twenty lymph nodes negative for metastatic carcinoma (0/20)  See oncology table  pT3, pN0  MMR normal       REVIEW OF SYSTEMS:   Constitutional: Denies fevers, chills or abnormal weight loss Eyes: Denies blurriness of vision Ears, nose, mouth, throat, and face: Denies mucositis or sore throat Respiratory: Denies cough, dyspnea or wheezes Cardiovascular: Denies palpitation, chest discomfort or lower extremity swelling Gastrointestinal:  Denies nausea, heartburn or change in bowel habits Skin: Denies abnormal skin rashes Lymphatics: Denies new lymphadenopathy or easy bruising Neurological:Denies numbness, tingling or new weaknesses Behavioral/Psych: Mood is stable, no new changes  All other systems were reviewed with the patient and are negative.   VITALS:   Today's Vitals   03/06/24 0859  BP: 138/60  Pulse: 84  Resp: 17  Temp: 97.7 F (36.5 C)  SpO2: 99%  Weight: 235 lb 14.4 oz (107 kg)  Body mass index is 41.79 kg/m.   Wt Readings from Last 3 Encounters:  03/06/24 235 lb 14.4 oz (107 kg)  09/19/23 235 lb 8 oz (106.8 kg)  07/10/23 217 lb 6 oz (98.6 kg)    Body mass index is 41.79 kg/m.  Performance status (ECOG): 1 - Symptomatic but completely ambulatory  PHYSICAL EXAM:   GENERAL:alert, no distress and comfortable SKIN: skin color, texture, turgor are normal, no rashes or significant lesions EYES: normal, Conjunctiva are pink and non-injected, sclera clear OROPHARYNX:no exudate, no erythema and lips, buccal mucosa, and tongue normal  NECK: supple, thyroid  normal size,  non-tender, without nodularity LYMPH:  no palpable lymphadenopathy in the cervical, axillary or inguinal LUNGS: clear to auscultation and percussion with normal breathing effort HEART: regular rate & rhythm and no murmurs and no lower extremity edema ABDOMEN:abdomen soft, non-tender and normal bowel sounds Musculoskeletal:no cyanosis of digits and no clubbing  NEURO: alert & oriented x 3 with fluent speech, no focal motor/sensory deficits  LABORATORY DATA:  I have reviewed the data as listed    Component Value Date/Time   NA 141 03/06/2024 0843   NA 141 07/04/2017 0850   K 4.5 03/06/2024 0843   K 3.8 07/04/2017 0850   CL 108 03/06/2024 0843   CO2 25 03/06/2024 0843   CO2 26 07/04/2017 0850   GLUCOSE 149 (H) 03/06/2024 0843   GLUCOSE 125 07/04/2017 0850   GLUCOSE 333 (H) 09/05/2006 1306   BUN 62 (H) 03/06/2024 0843   BUN 15.3 07/04/2017 0850   CREATININE 4.58 (H) 03/06/2024 0843   CREATININE 1.0 07/04/2017 0850   CALCIUM  8.7 (L) 03/06/2024 0843   CALCIUM  9.4 07/04/2017 0850   PROT 7.6 03/06/2024 0843   PROT 7.1 07/04/2017 0850   ALBUMIN 3.5 03/06/2024 0843   ALBUMIN 2.6 (L) 07/04/2017 0850   AST 16 03/06/2024 0843   AST 18 07/04/2017 0850   ALT 13 03/06/2024 0843   ALT 10 07/04/2017 0850   ALKPHOS 84 03/06/2024 0843   ALKPHOS 86 07/04/2017 0850   BILITOT 0.2 03/06/2024 0843   BILITOT <0.22 07/04/2017 0850   GFRNONAA 11 (L) 03/06/2024 0843   GFRAA 24 (L) 01/04/2020 1038    Lab Results  Component Value Date   WBC 4.8 03/06/2024   NEUTROABS 3.2 03/06/2024   HGB 9.1 (L) 03/06/2024   HCT 27.2 (L) 03/06/2024   MCV 91.6 03/06/2024   PLT 159 03/06/2024

## 2024-03-06 ENCOUNTER — Inpatient Hospital Stay: Attending: Nurse Practitioner

## 2024-03-06 ENCOUNTER — Inpatient Hospital Stay: Payer: Self-pay | Admitting: Nurse Practitioner

## 2024-03-06 VITALS — BP 138/60 | HR 84 | Temp 97.7°F | Resp 17 | Wt 235.9 lb

## 2024-03-06 DIAGNOSIS — N189 Chronic kidney disease, unspecified: Secondary | ICD-10-CM | POA: Diagnosis not present

## 2024-03-06 DIAGNOSIS — C50212 Malignant neoplasm of upper-inner quadrant of left female breast: Secondary | ICD-10-CM

## 2024-03-06 DIAGNOSIS — D509 Iron deficiency anemia, unspecified: Secondary | ICD-10-CM | POA: Insufficient documentation

## 2024-03-06 DIAGNOSIS — Z170 Estrogen receptor positive status [ER+]: Secondary | ICD-10-CM | POA: Diagnosis not present

## 2024-03-06 DIAGNOSIS — Z85038 Personal history of other malignant neoplasm of large intestine: Secondary | ICD-10-CM | POA: Insufficient documentation

## 2024-03-06 DIAGNOSIS — C183 Malignant neoplasm of hepatic flexure: Secondary | ICD-10-CM | POA: Diagnosis not present

## 2024-03-06 DIAGNOSIS — Z79810 Long term (current) use of selective estrogen receptor modulators (SERMs): Secondary | ICD-10-CM | POA: Insufficient documentation

## 2024-03-06 LAB — CMP (CANCER CENTER ONLY)
ALT: 13 U/L (ref 0–44)
AST: 16 U/L (ref 15–41)
Albumin: 3.5 g/dL (ref 3.5–5.0)
Alkaline Phosphatase: 84 U/L (ref 38–126)
Anion gap: 8 (ref 5–15)
BUN: 62 mg/dL — ABNORMAL HIGH (ref 6–20)
CO2: 25 mmol/L (ref 22–32)
Calcium: 8.7 mg/dL — ABNORMAL LOW (ref 8.9–10.3)
Chloride: 108 mmol/L (ref 98–111)
Creatinine: 4.58 mg/dL — ABNORMAL HIGH (ref 0.44–1.00)
GFR, Estimated: 11 mL/min — ABNORMAL LOW (ref >60)
Glucose, Bld: 149 mg/dL — ABNORMAL HIGH (ref 70–99)
Potassium: 4.5 mmol/L (ref 3.5–5.1)
Sodium: 141 mmol/L (ref 135–145)
Total Bilirubin: 0.2 mg/dL (ref 0.0–1.2)
Total Protein: 7.6 g/dL (ref 6.5–8.1)

## 2024-03-06 LAB — CBC WITH DIFFERENTIAL (CANCER CENTER ONLY)
Abs Immature Granulocytes: 0.02 10*3/uL (ref 0.00–0.07)
Basophils Absolute: 0 10*3/uL (ref 0.0–0.1)
Basophils Relative: 0 %
Eosinophils Absolute: 0.2 10*3/uL (ref 0.0–0.5)
Eosinophils Relative: 3 %
HCT: 27.2 % — ABNORMAL LOW (ref 36.0–46.0)
Hemoglobin: 9.1 g/dL — ABNORMAL LOW (ref 12.0–15.0)
Immature Granulocytes: 0 %
Lymphocytes Relative: 22 %
Lymphs Abs: 1.1 10*3/uL (ref 0.7–4.0)
MCH: 30.6 pg (ref 26.0–34.0)
MCHC: 33.5 g/dL (ref 30.0–36.0)
MCV: 91.6 fL (ref 80.0–100.0)
Monocytes Absolute: 0.4 10*3/uL (ref 0.1–1.0)
Monocytes Relative: 9 %
Neutro Abs: 3.2 10*3/uL (ref 1.7–7.7)
Neutrophils Relative %: 66 %
Platelet Count: 159 10*3/uL (ref 150–400)
RBC: 2.97 MIL/uL — ABNORMAL LOW (ref 3.87–5.11)
RDW: 14.7 % (ref 11.5–15.5)
WBC Count: 4.8 10*3/uL (ref 4.0–10.5)
nRBC: 0 % (ref 0.0–0.2)

## 2024-03-06 LAB — CEA (ACCESS): CEA (CHCC): 1.6 ng/mL (ref 0.00–5.00)

## 2024-03-08 ENCOUNTER — Encounter: Payer: Self-pay | Admitting: Nurse Practitioner

## 2024-06-02 ENCOUNTER — Other Ambulatory Visit: Payer: Self-pay | Admitting: Adult Health

## 2024-06-02 DIAGNOSIS — Z170 Estrogen receptor positive status [ER+]: Secondary | ICD-10-CM

## 2024-06-15 ENCOUNTER — Other Ambulatory Visit: Payer: Self-pay

## 2024-06-23 ENCOUNTER — Encounter: Payer: Self-pay | Admitting: Registered Nurse

## 2024-09-04 ENCOUNTER — Other Ambulatory Visit: Payer: Self-pay

## 2024-09-04 DIAGNOSIS — C183 Malignant neoplasm of hepatic flexure: Secondary | ICD-10-CM

## 2024-09-04 DIAGNOSIS — C50212 Malignant neoplasm of upper-inner quadrant of left female breast: Secondary | ICD-10-CM

## 2024-09-06 NOTE — Assessment & Plan Note (Signed)
 pT1c,N0M0, G3, ER+ (80% weakly positive) PR- HER2 - Ki67 90%, oncotype 60 -S/p lumpectomy 12/31/2016, path showed invasive ductal carcinoma, 1.8 cm, grade 3 with negative margins, 1 SLN negative -Genetic testing showed VUS in RAD51D that was reclassified as likely benign -Due to high risk oncotype 60, which predicts 34% recurrence risk in the future, she received adjuvant chemo ddAC x4. She had neutropenia, mucositis, and hospitalization with AC, and only received 4/12 cycles of weekly abraxane . Chemo discontinued early due to cytopenias.  -S/p adjuvant radiation 05/2017 - 06/2017 -Currently on Tamoxifen  for a goal of 10 years, starting 07/2017. Tolerating well -Managed by Dr. Layla in the past -tolerating tamoxifen  well.  Exam is benign, no clinical concern for breast cancer recurrence.  Continue surveillance 3D screening mammogram done 11/2023 was benign.  Scheduled for 3D screening mammogram in January 2026. -continue tamoxifen  daily.  -expect labs and follow up in 6 months, sooner if needed.

## 2024-09-06 NOTE — Progress Notes (Unsigned)
 Patient Care Team: Royden Ronal Czar, [name] as PCP - General (Internal Medicine) Izell Domino, MD as Attending Physician (Radiation Oncology) Tommas Pears, MD as Consulting Physician (Endocrinology) Cam Morene ORN, MD as Attending Physician (Urology) Jerrye Burnard HERO, NP as Nurse Practitioner (Nephrology) Sheldon Standing, MD as Consulting Physician (General Surgery) Elicia Claw, MD as Consulting Physician (Gastroenterology) Hanford Powell BRAVO, NP as Nurse Practitioner (Hematology and Oncology)  Clinic Day:  09/07/2024  Referring physician: Royden Ronal Czar, [name]  ASSESSMENT & PLAN:   Assessment & Plan: 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  Staging workup negative for distant metastasis -S/p proximal colectomy by Dr. Sheldon on 12/19/2022, there were no high risk features.  -Previous genetic testing with breast cancer dx included lynch syndrome, which was negative  -We reviewed the low to moderate recurrence risk in stage II colon cancer, around 20-25%. -We previously discussed the role of guardant reveal to detect circulating tumor DNA, however even if detected, she would not be an ideal candidate for chemotherapy given her severe CKD. She did not tolerate adjuvant chemo for breast cancer. We opted to forego this testing -[name] is clinically doing well.  Exam is benign, labs are unremarkable.  Overall no clinical concern for recurrence.  Continue colon cancer surveillance -Surveillance CT CAP done November 2024.  No evidence of recurrence, lymphadenopathy, or metastatic disease. -She has delayed colonoscopy due to financial concerns.  Will schedule one as soon as she is able to.  Meantime we will continue yearly CT scans for colon cancer surveillance. This is due in 09/2024, and ordered  as part of today's visit.  -Follow-up in 6 months, or sooner if needed.   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 She had neutropenia, mucositis, and hospitalization with AC, and only received 4/12 cycles of weekly abraxane . Chemo discontinued early due to cytopenias.  -S/p adjuvant radiation 05/2017 - 06/2017 -Currently on Tamoxifen  for a goal of 10 years, starting 07/2017. Tolerating well -Managed by Dr. Layla in the past -tolerating tamoxifen  well.  Exam is benign, no clinical concern for breast cancer recurrence.  Continue surveillance 3D screening mammogram done 11/2023 was benign.  Scheduled for 3D screening mammogram in January 2026. -continue tamoxifen  daily.  -expect labs and follow up in 6 months, sooner if needed.   Chronic anemia due to CKD Hgb 8.6 and Hct 26.1. significant impairment to renal functions with eGFR at 10. She is followed routinely by nephrology. She does not require blood transfusion or iron infusion at this time. This is monitored closely.   History of left breast cancer, ER + Continues tamoxifen  with minimal side effects. Breast exam benign today. She will be due for screening mammogram in late January or early February 2026. This was ordered as part of today's visit.   Colon cancer of hepatic flexure Continues to be unable to have follow  up colonoscopy done due to financial concerns. She is due to have surveilance CT  CAP in November 2025. This was ordered as part of her visit today. CEA level is pending.   Plan: Labs  reviewed.  -moderate and stable anemia. -Stable chronic kidney diease, followed by nephrology.  -CEA level pending. CT CAP ordered for colon cancer surveillance.  +unable to get follow up colonoscopy due to financial concerns.  -screening mammogram ordered for early 12/2024. Continue tamoxifen  daily.  Plan for labs and follow up in 6 months, sooner if needed.   The patient understands the plans discussed today and is in agreement with them.  She knows to contact our office if she develops concerns prior to her next appointment.  I provided 25 minutes of face-to-face time during this encounter and > 50% was spent counseling as documented under my assessment and plan.    [name], NP  [facility] - A DEPT OF [facility] [address] [place] [address] Dept: [phone] Dept Fax: [phone]   Orders Placed This Encounter  Procedures   CT CHEST ABDOMEN PELVIS WO CONTRAST    Standing Status:   Future    Expected Date:   10/08/2024    Expiration Date:   09/07/2025    Is patient pregnant?:   No    Preferred imaging location?:   [facility]    If indicated for the ordered procedure, I authorize the administration of oral contrast media per Radiology protocol:   Yes    Does the patient have a contrast media/X-ray dye allergy?:   No   MM 3D SCREENING MAMMOGRAM BILATERAL BREAST    Standing Status:   Future    Expected Date:   12/21/2024    Expiration Date:   09/07/2025    Reason for Exam (SYMPTOM  OR DIAGNOSIS REQUIRED):   screening for breast cancer    Is the patient pregnant?:   No    Preferred imaging location?:   External             patient goes to [facility].      CHIEF COMPLAINT:  CC: History of left breast cancer and colon cancer at hepatic flexure  Current Treatment: Tamoxifen  daily (started 07/2017 with goal of 10 years); surveillance  INTERVAL HISTORY:  Abigail Yoder is here today for  repeat clinical assessment.  She was last seen by me on 03/06/2024.  She continues to take tamoxifen  with minimal side effects. She has noted no new problems in either breast. She does have moderate arthritis in both knees. Worse when cold outside. She keeps a heater under herdesk at work to keep them warm. She tries to walk frequently to maintain good mobility. She denies chest pain, chest pressure, or shortness of breath. She denies headaches or visual disturbances. She denies abdominal pain, nausea, vomiting, or changes in bowel or bladder habits. She denies blood in her stool or hematemesis. She denies fevers or chills. She denies pain. Her appetite is good. Her weight has been stable.  I have reviewed the past medical history, past surgical history, social history and family history with the patient and they are unchanged from previous note.  ALLERGIES:  is allergic to nsaids and metformin  hcl er.  MEDICATIONS:  Current Outpatient Medications  Medication Sig Dispense Refill   amLODipine  (NORVASC ) 10 MG tablet Take 1 tablet (10 mg total) by mouth daily.     carvedilol  (COREG ) 25 MG tablet Take 25 mg by mouth 2 (two) times daily.  hydrALAZINE  (APRESOLINE ) 50 MG tablet Take 50 mg by mouth 3 (three) times daily.     JANUVIA 25 MG tablet Take 10 mg by mouth daily.     NOVOLOG  MIX 70/30 FLEXPEN (70-30) 100 UNIT/ML FlexPen Inject 10-15 Units into the skin See admin instructions. Inject 15 units into the skin in the morning before breakfast, 10 units before lunch and 15 units before dinner     sodium bicarbonate  650 MG tablet Take 650 mg by mouth 2 (two) times daily.     tamoxifen  (NOLVADEX ) 20 MG tablet TAKE 1 TABLET BY MOUTH EVERY DAY 90 tablet 3   traMADol  (ULTRAM ) 50 MG tablet Take 1-2 tablets (50-100 mg total) by mouth every 6 (six) hours as needed for moderate pain. 20 tablet 0   No current facility-administered medications for this visit.    HISTORY OF PRESENT ILLNESS:   Oncology History   Malignant neoplasm of upper-inner quadrant of left breast in female, estrogen receptor positive (HCC)  11/27/2016 Initial Diagnosis   Malignant neoplasm of upper-inner quadrant of left breast in female, estrogen receptor positive (HCC)   12/11/2016 Genetic Testing   Patient has genetic testing done for personal and family history of breast cancer. RAD51D c.919G>A VUS identified on the Breast/Gyn Panel.  Negative genetic testing for the MSH2 inversion analysis (Boland inversion). The Breast/GYN gene panel offered by [facility] includes sequencing and rearrangement analysis for the following 23 genes:  ATM, BARD1, BRCA1, BRCA2, BRIP1, CDH1, CHEK2, EPCAM, FANCC, MLH1, MSH2, MSH6, MUTYH, NBN, NF1, PALB2, PMS2, POLD1, PTEN, RAD51C, RAD51D, RECQL, and TP53.     UPDATE: RAD51D c.919G>A VUS has been reclassified to Likely Benign. The report date is 03/04/2019.    12/31/2016 Surgery   Left breast lumpectomy Jeoffrey): IDC, 1.8cm, grade 3, margins negative, 1SLN negative, ER+(80%), PR-(2%), Ki-67 90%, HER-2 negative (ratio 1.05).    01/17/2017 - 05/16/2017 Adjuvant Chemotherapy   Doxorubicin  and Cyclophosphamide  x 4 cycles to be followed by weekly Abraxane    06/10/2017 - 07/08/2017 Radiation Therapy   Adjuvant radiation Audry): 1.) Left Breast, 2.67 Gy in 15 fractions.  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 Elicia: -Perianal skin tags -Three 4 - 8 mm polyps in the cecum, ascending colon, and sigmoid colon -Likely malignant tumor in the transverse colon. Biopsied and tatooed -Internal hemorrhoids   10/04/2022 Initial Biopsy   Polyps: Tubulovillous adenoma. Tubular adenoma  2. LG intestine, transverse colon Invasive adenocarcinoma    10/10/2022 Imaging   CT CAP IMPRESSION: 1. No specific findings identified to suggest  metastatic disease to the chest, abdomen or pelvis. 2. There is a focal area of circumferential luminal narrowing and wall thickening involving the hepatic flexure which is of uncertain clinical significance and correlation with colonoscopy findings is advised. 3. Gas is identified within the non dependent portion of the urinary bladder. Etiology indeterminate. Correlate for any history of recent instrumentation. If there is no history of recent instrumentation then findings are concerning for cystitis. 4.  Aortic Atherosclerosis (ICD10-I70.0).   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   12/19/2022 Cancer Staging   Staging form: Colon and Rectum, AJCC 8th Edition - Pathologic stage from 12/19/2022: Stage IIA (pT3, pN0, cM0) - Signed by Burton, Lacie K, NP on 01/09/2023 Stage prefix: Initial  diagnosis Total positive nodes: 0 Histologic grading system: 4 grade system Histologic grade (G): G2 Lymph-vascular invasion (LVI): LVI not present (absent)/not identified   12/19/2022 Definitive Surgery   PROCEDURE:   -ROBOTIC COLECTOMY PROXIMAL -INTRAOPERATIVE ASSESSMENT OF TISSUE VASCULAR PERFUSION USING ICG (indocyanine green) IMMUNOFLUORESCENCE -TRANSVERSUS ABDOMINIS PLANE (TAP) BLOCK - BILATERAL SURGEON:  Elspeth KYM Schultze, MD ASSISTANT: Lonni Pizza, MD   12/19/2022 Pathology Results   FINAL MICROSCOPIC DIAGNOSIS:  A. COLON, PROXIMAL, RESECTION:  Invasive colonic adenocarcinoma, 4 cm  Carcinoma extends into pericolonic connective tissue  All surgical margins negative for carcinoma  Twenty lymph nodes negative for metastatic carcinoma (0/20)  See oncology table  pT3, pN0  MMR normal       REVIEW OF SYSTEMS:   Constitutional: Denies fevers, chills or abnormal weight loss Eyes: Denies blurriness of vision Ears, nose, mouth, throat, and face: Denies mucositis or sore throat Respiratory: Denies cough, dyspnea or  wheezes Cardiovascular: Denies palpitation, chest discomfort or lower extremity swelling Gastrointestinal:  Denies nausea, heartburn or change in bowel habits Skin: Denies abnormal skin rashes Lymphatics: Denies new lymphadenopathy or easy bruising Neurological:Denies numbness, tingling or new weaknesses Behavioral/Psych: Mood is stable, no new changes  All other systems were reviewed with the patient and are negative.   VITALS:   Today's Vitals   09/07/24 0855 09/07/24 0942  BP: 138/66   Pulse: 85   Resp: 17   Temp: 97.8 F (36.6 C)   SpO2: 99%   Weight: 234 lb 12.8 oz (106.5 kg)   PainSc:  0-No pain   Body mass index is 41.59 kg/m.   Wt Readings from Last 3 Encounters:  09/07/24 234 lb 12.8 oz (106.5 kg)  03/06/24 235 lb 14.4 oz (107 kg)  09/19/23 235 lb 8 oz (106.8 kg)    Body mass index is 41.59 kg/m.  Performance status (ECOG): 1 - Symptomatic but completely ambulatory  PHYSICAL EXAM:   GENERAL:alert, no distress and comfortable SKIN: skin color, texture, turgor are normal, no rashes or significant lesions EYES: normal, Conjunctiva are pink and non-injected, sclera clear OROPHARYNX:no exudate, no erythema and lips, buccal mucosa, and tongue normal  NECK: supple, thyroid  normal size, non-tender, without nodularity LYMPH:  no palpable lymphadenopathy in the cervical, axillary or inguinal LUNGS: clear to auscultation and percussion with normal breathing effort HEART: regular rate & rhythm and no murmurs and no lower extremity edema ABDOMEN:abdomen soft, non-tender and normal bowel sounds Musculoskeletal:no cyanosis of digits and no clubbing  NEURO: alert & oriented x 3 with fluent speech, no focal motor/sensory deficits BREAST: there is well healed surgical scar adjacent to the areola of the left nipple. There are expected skin changes associated with radiation also noted. There are no palpable lumps or masses. There is no nipple inversion or nipple discharge. There  is no axillary lymphadenopathy on the left side. The right breast contains no palpable lumps or masses today. There is no nipple inversion or nipple discharge. There is no axillary lymphadenopathy on the right side.   LABORATORY DATA:  I have reviewed the data as listed    Component Value Date/Time   NA 140 09/07/2024 0804   NA 141 07/04/2017 0850   K 4.1 09/07/2024 0804   K 3.8 07/04/2017 0850   CL 113 (H) 09/07/2024 0804   CO2 20 (L) 09/07/2024 0804   CO2 26 07/04/2017 0850   GLUCOSE 131 (H) 09/07/2024 0804   GLUCOSE 125 07/04/2017 0850   GLUCOSE 333 (H) 09/05/2006 1306   BUN  65 (H) 09/07/2024 0804   BUN 15.3 07/04/2017 0850   CREATININE 4.90 (H) 09/07/2024 0804   CREATININE 1.0 07/04/2017 0850   CALCIUM  9.1 09/07/2024 0804   CALCIUM  9.4 07/04/2017 0850   PROT 7.8 09/07/2024 0804   PROT 7.1 07/04/2017 0850   ALBUMIN 3.6 09/07/2024 0804   ALBUMIN 2.6 (L) 07/04/2017 0850   AST 21 09/07/2024 0804   AST 18 07/04/2017 0850   ALT 20 09/07/2024 0804   ALT 10 07/04/2017 0850   ALKPHOS 90 09/07/2024 0804   ALKPHOS 86 07/04/2017 0850   BILITOT 0.3 09/07/2024 0804   BILITOT <0.22 07/04/2017 0850   GFRNONAA 10 (L) 09/07/2024 0804   GFRAA 24 (L) 01/04/2020 1038    Lab Results  Component Value Date   WBC 5.5 09/07/2024   NEUTROABS 4.0 09/07/2024   HGB 8.6 (L) 09/07/2024   HCT 26.1 (L) 09/07/2024   MCV 91.3 09/07/2024   PLT 160 09/07/2024

## 2024-09-06 NOTE — Assessment & Plan Note (Signed)
 G2, pT3N0M0 stage IIA, MMR normal -She was asymptomatic with positive Cologuard, found to have adenocarcinoma of the hepatic flexure on first colonoscopy.  Staging workup negative for distant metastasis -S/p proximal colectomy by Dr. Sheldon on 12/19/2022, there were no high risk features.  -Previous genetic testing with breast cancer dx included lynch syndrome, which was negative  -We reviewed the low to moderate recurrence risk in stage II colon cancer, around 20-25%. -We previously discussed the role of guardant reveal to detect circulating tumor DNA, however even if detected, she would not be an ideal candidate for chemotherapy given her severe CKD. She did not tolerate adjuvant chemo for breast cancer. We opted to forego this testing -Abigail Yoder is clinically doing well.  Exam is benign, labs are unremarkable.  Overall no clinical concern for recurrence.  Continue colon cancer surveillance -Surveillance CT CAP done November 2024.  No evidence of recurrence, lymphadenopathy, or metastatic disease. -She has delayed colonoscopy due to financial concerns.  Will schedule one as soon as she is able to.  Meantime we will continue yearly CT scans for colon cancer surveillance. -Follow-up in 6 months, or sooner if needed.

## 2024-09-07 ENCOUNTER — Inpatient Hospital Stay: Attending: Nurse Practitioner

## 2024-09-07 ENCOUNTER — Inpatient Hospital Stay: Admitting: Nurse Practitioner

## 2024-09-07 ENCOUNTER — Encounter: Payer: Self-pay | Admitting: Nurse Practitioner

## 2024-09-07 VITALS — BP 138/66 | HR 85 | Temp 97.8°F | Resp 17 | Wt 234.8 lb

## 2024-09-07 DIAGNOSIS — Z170 Estrogen receptor positive status [ER+]: Secondary | ICD-10-CM | POA: Diagnosis not present

## 2024-09-07 DIAGNOSIS — N189 Chronic kidney disease, unspecified: Secondary | ICD-10-CM | POA: Insufficient documentation

## 2024-09-07 DIAGNOSIS — C50212 Malignant neoplasm of upper-inner quadrant of left female breast: Secondary | ICD-10-CM | POA: Diagnosis present

## 2024-09-07 DIAGNOSIS — D631 Anemia in chronic kidney disease: Secondary | ICD-10-CM | POA: Insufficient documentation

## 2024-09-07 DIAGNOSIS — M170 Bilateral primary osteoarthritis of knee: Secondary | ICD-10-CM | POA: Insufficient documentation

## 2024-09-07 DIAGNOSIS — Z85038 Personal history of other malignant neoplasm of large intestine: Secondary | ICD-10-CM | POA: Diagnosis present

## 2024-09-07 DIAGNOSIS — Z9221 Personal history of antineoplastic chemotherapy: Secondary | ICD-10-CM | POA: Insufficient documentation

## 2024-09-07 DIAGNOSIS — Z923 Personal history of irradiation: Secondary | ICD-10-CM | POA: Insufficient documentation

## 2024-09-07 DIAGNOSIS — C183 Malignant neoplasm of hepatic flexure: Secondary | ICD-10-CM

## 2024-09-07 LAB — CBC WITH DIFFERENTIAL (CANCER CENTER ONLY)
Abs Immature Granulocytes: 0.02 K/uL (ref 0.00–0.07)
Basophils Absolute: 0 K/uL (ref 0.0–0.1)
Basophils Relative: 0 %
Eosinophils Absolute: 0.1 K/uL (ref 0.0–0.5)
Eosinophils Relative: 2 %
HCT: 26.1 % — ABNORMAL LOW (ref 36.0–46.0)
Hemoglobin: 8.6 g/dL — ABNORMAL LOW (ref 12.0–15.0)
Immature Granulocytes: 0 %
Lymphocytes Relative: 19 %
Lymphs Abs: 1.1 K/uL (ref 0.7–4.0)
MCH: 30.1 pg (ref 26.0–34.0)
MCHC: 33 g/dL (ref 30.0–36.0)
MCV: 91.3 fL (ref 80.0–100.0)
Monocytes Absolute: 0.4 K/uL (ref 0.1–1.0)
Monocytes Relative: 7 %
Neutro Abs: 4 K/uL (ref 1.7–7.7)
Neutrophils Relative %: 72 %
Platelet Count: 160 K/uL (ref 150–400)
RBC: 2.86 MIL/uL — ABNORMAL LOW (ref 3.87–5.11)
RDW: 14.9 % (ref 11.5–15.5)
WBC Count: 5.5 K/uL (ref 4.0–10.5)
nRBC: 0 % (ref 0.0–0.2)

## 2024-09-07 LAB — CMP (CANCER CENTER ONLY)
ALT: 20 U/L (ref 0–44)
AST: 21 U/L (ref 15–41)
Albumin: 3.6 g/dL (ref 3.5–5.0)
Alkaline Phosphatase: 90 U/L (ref 38–126)
Anion gap: 7 (ref 5–15)
BUN: 65 mg/dL — ABNORMAL HIGH (ref 6–20)
CO2: 20 mmol/L — ABNORMAL LOW (ref 22–32)
Calcium: 9.1 mg/dL (ref 8.9–10.3)
Chloride: 113 mmol/L — ABNORMAL HIGH (ref 98–111)
Creatinine: 4.9 mg/dL — ABNORMAL HIGH (ref 0.44–1.00)
GFR, Estimated: 10 mL/min — ABNORMAL LOW (ref >60)
Glucose, Bld: 131 mg/dL — ABNORMAL HIGH (ref 70–99)
Potassium: 4.1 mmol/L (ref 3.5–5.1)
Sodium: 140 mmol/L (ref 135–145)
Total Bilirubin: 0.3 mg/dL (ref 0.0–1.2)
Total Protein: 7.8 g/dL (ref 6.5–8.1)

## 2024-09-07 LAB — CEA (ACCESS): CEA (CHCC): 1.54 ng/mL (ref 0.00–5.00)

## 2024-09-22 ENCOUNTER — Other Ambulatory Visit: Payer: Self-pay

## 2024-09-22 DIAGNOSIS — C183 Malignant neoplasm of hepatic flexure: Secondary | ICD-10-CM

## 2024-09-22 DIAGNOSIS — C50212 Malignant neoplasm of upper-inner quadrant of left female breast: Secondary | ICD-10-CM

## 2024-09-22 NOTE — Progress Notes (Signed)
 Rec'd Secure Chat message from Sierra Williams with [employer] stating that pt's insurance will only approve pt to have radiology test at [facility].  Updated CT Scan order.

## 2024-09-25 ENCOUNTER — Ambulatory Visit
Admission: RE | Admit: 2024-09-25 | Discharge: 2024-09-25 | Disposition: A | Discharge status: Home / Self Care | Source: Ambulatory Visit | Attending: Hematology | Admitting: Hematology

## 2024-09-25 DIAGNOSIS — C183 Malignant neoplasm of hepatic flexure: Secondary | ICD-10-CM

## 2024-09-27 ENCOUNTER — Ambulatory Visit: Payer: Self-pay | Admitting: Hematology

## 2024-09-28 NOTE — Telephone Encounter (Addendum)
 Tried to call patient to give the below CT results, no answer to call left message to call us  back as soon as possible.    ----- Message from Onita Mattock sent at 09/27/2024  6:42 PM EST ----- Please let pt know her CT results. I have messaged Deshjna to schedule her future f/u per LOS from 09/07/24.  Onita Mattock  ----- Message ----- From: Rebecka, Rad Results In Sent: 09/25/2024   3:58 PM EST To: Onita Mattock, MD

## 2025-03-29 ENCOUNTER — Inpatient Hospital Stay: Attending: Nurse Practitioner

## 2025-03-29 ENCOUNTER — Inpatient Hospital Stay: Admitting: Nurse Practitioner
